# Patient Record
Sex: Male | Born: 1945
Health system: Southern US, Community
[De-identification: ages and names within clinical notes are randomized; demographics above are authoritative.]

## PROBLEM LIST (undated history)

## (undated) DIAGNOSIS — R011 Cardiac murmur, unspecified: Secondary | ICD-10-CM

## (undated) DIAGNOSIS — I4891 Unspecified atrial fibrillation: Secondary | ICD-10-CM

## (undated) DIAGNOSIS — H269 Unspecified cataract: Secondary | ICD-10-CM

## (undated) DIAGNOSIS — I10 Essential (primary) hypertension: Secondary | ICD-10-CM

## (undated) DIAGNOSIS — C61 Malignant neoplasm of prostate: Secondary | ICD-10-CM

## (undated) DIAGNOSIS — D573 Sickle-cell trait: Secondary | ICD-10-CM

## (undated) DIAGNOSIS — M889 Osteitis deformans of unspecified bone: Secondary | ICD-10-CM

## (undated) DIAGNOSIS — I071 Rheumatic tricuspid insufficiency: Secondary | ICD-10-CM

## (undated) DIAGNOSIS — N189 Chronic kidney disease, unspecified: Secondary | ICD-10-CM

## (undated) HISTORY — DX: Rheumatic tricuspid insufficiency: I07.1

## (undated) HISTORY — DX: Chronic kidney disease, unspecified: N18.9

## (undated) HISTORY — DX: Unspecified cataract: H26.9

## (undated) HISTORY — DX: Malignant neoplasm of prostate: C61

## (undated) HISTORY — PX: BLADDER SURGERY: SHX569

## (undated) HISTORY — PX: PENILE PROSTHESIS IMPLANT: SHX240

## (undated) HISTORY — DX: Sickle-cell trait: D57.3

## (undated) HISTORY — DX: Essential (primary) hypertension: I10

## (undated) HISTORY — DX: Unspecified atrial fibrillation: I48.91

## (undated) HISTORY — DX: Cardiac murmur, unspecified: R01.1

## (undated) HISTORY — PX: COLONOSCOPY: SHX174

## (undated) HISTORY — PX: CHOLECYSTECTOMY: SHX55

---

## 2000-05-17 ENCOUNTER — Encounter: Admission: RE | Admit: 2000-05-17 | Discharge: 2000-06-14 | Payer: Self-pay | Admitting: Orthopedic Surgery

## 2002-04-21 ENCOUNTER — Emergency Department (HOSPITAL_COMMUNITY): Admission: EM | Admit: 2002-04-21 | Discharge: 2002-04-21 | Payer: Self-pay | Admitting: Emergency Medicine

## 2005-03-23 ENCOUNTER — Encounter: Admission: RE | Admit: 2005-03-23 | Discharge: 2005-03-23 | Payer: Self-pay | Admitting: Cardiology

## 2005-03-25 ENCOUNTER — Emergency Department (HOSPITAL_COMMUNITY): Admission: EM | Admit: 2005-03-25 | Discharge: 2005-03-25 | Payer: Self-pay | Admitting: Emergency Medicine

## 2005-04-07 ENCOUNTER — Ambulatory Visit (HOSPITAL_BASED_OUTPATIENT_CLINIC_OR_DEPARTMENT_OTHER): Admission: RE | Admit: 2005-04-07 | Discharge: 2005-04-07 | Payer: Self-pay | Admitting: *Deleted

## 2005-04-11 ENCOUNTER — Ambulatory Visit: Payer: Self-pay | Admitting: Internal Medicine

## 2005-07-07 ENCOUNTER — Ambulatory Visit (HOSPITAL_COMMUNITY): Admission: RE | Admit: 2005-07-07 | Discharge: 2005-07-07 | Payer: Self-pay | Admitting: *Deleted

## 2008-03-23 ENCOUNTER — Ambulatory Visit (HOSPITAL_COMMUNITY): Admission: RE | Admit: 2008-03-23 | Discharge: 2008-03-23 | Payer: Self-pay | Admitting: Family Medicine

## 2009-11-29 HISTORY — PX: FINGER GANGLION CYST EXCISION: SHX1636

## 2010-02-13 ENCOUNTER — Ambulatory Visit (HOSPITAL_BASED_OUTPATIENT_CLINIC_OR_DEPARTMENT_OTHER): Admission: RE | Admit: 2010-02-13 | Discharge: 2010-02-13 | Payer: Self-pay | Admitting: Urology

## 2010-02-27 ENCOUNTER — Ambulatory Visit (HOSPITAL_COMMUNITY): Admission: RE | Admit: 2010-02-27 | Discharge: 2010-02-27 | Payer: Self-pay | Admitting: Internal Medicine

## 2010-11-29 HISTORY — PX: HYDROCELE EXCISION: SHX482

## 2011-02-22 LAB — POCT I-STAT 4, (NA,K, GLUC, HGB,HCT)
HCT: 43 % (ref 39.0–52.0)
Sodium: 141 mEq/L (ref 135–145)

## 2011-04-16 NOTE — Op Note (Signed)
Jeffrey Perry, Jeffrey Perry                  ACCOUNT NO.:  [id number]   MEDICAL RECORD NO.:  [id number]          PATIENT TYPE:  AMB   LOCATION:  ENDO                         FACILITY:  [facility]   PHYSICIAN:  Georgiana Spinner, M.D.    DATE OF BIRTH:  [birth year]   DATE OF PROCEDURE:  07/07/2005  DATE OF DISCHARGE:                                 OPERATIVE REPORT   PROCEDURE:  Upper endoscopy.   INDICATIONS:  Gastroesophageal reflux disease.   ANESTHESIA:  Demerol 60, Versed 7 mg.   DESCRIPTION OF PROCEDURE:  With the patient mildly sedated in the left  lateral decubitus position, the Olympus videoscopic endoscope was inserted  in the mouth, passed under direct vision through the esophagus which  appeared normal. There was no evidence of Barrett's. We entered into the  stomach, fundus, body, antrum, duodenal bulb, and second portion of duodenum  appeared normal. From this point, the endoscope was slowly withdrawn taking  circumferential views of the duodenal mucosa until the endoscope had been  pulled back into the stomach, placed in retroflexion to view the stomach  from below. The endoscope was straightened and withdrawn taking  circumferential views of the remaining gastric and esophageal mucosa. The  patient's vital signs and pulse oximeter remained stable. The patient  tolerated the procedure well without apparent complications.   FINDINGS:  Loose wrap of the GE junction around the endoscope, otherwise an  unremarkable examination.   PLAN:  Proceed to colonoscopy.       GMO/MEDQ  D:  07/07/2005  T:  07/07/2005  Job:  [id number]   cc:   Jonita Albee, M.D.  [facility]  [address]  [place]  [place] [address]  Fax: [phone]

## 2011-04-16 NOTE — Op Note (Signed)
NAMELATAVIOUS, BITTER                  ACCOUNT NO.:  [id number]   MEDICAL RECORD NO.:  [id number]          PATIENT TYPE:  AMB   LOCATION:  ENDO                         FACILITY:  [facility]   PHYSICIAN:  Georgiana Spinner, M.D.    DATE OF BIRTH:  Jul 25, 1946   DATE OF PROCEDURE:  07/07/2005  DATE OF DISCHARGE:                                 OPERATIVE REPORT   PROCEDURE:  Colonoscopy.   ANESTHESIA:  Demerol 10 mg, Versed 1 mg.   INDICATIONS:  Colon cancer screening. History of Hemoccult positivity.   DESCRIPTION OF PROCEDURE:  With the patient mildly sedated in the left  lateral decubitus position, a rectal examination was performed which was  unremarkable. Subsequently the Olympus videoscopic colonoscope was inserted  into the rectum and passed under direct vision to the cecum identified by  the ileocecal valve and appendiceal orifice both of which were photographed.  Of note, the prep was slightly suboptimal and there was areas of tenacious  yellow thick fecal material that was difficult to wash and suction because  of its tenacity but we did as best we could withdrawing the scope taking  circumferential views of colonic mucosa stopping only then in the rectum  which appeared normal on direct and retroflexed view and showed hemorrhoids.  The endoscope was straightened and withdrawn. The patient's vital signs and  pulse oximeter remained stable. The patient tolerated the procedure well  without apparent complication.   FINDINGS:  Internal hemorrhoids otherwise an unremarkable colonoscopic  examination to the cecum limited somewhat by prep.   PLAN:  Consider repeat examination in a few years.       GMO/MEDQ  D:  07/07/2005  T:  07/07/2005  Job:  [id number]   cc:   Jonita Albee, M.D.  [facility]  [address]  [place]  [place] [address]  Fax: [phone]

## 2011-04-16 NOTE — Procedures (Signed)
NAMEKONGMENG, SANTORO                  ACCOUNT NO.:  [id number]   MEDICAL RECORD NO.:  [id number]          PATIENT TYPE:  OUT   LOCATION:  SLEEP CENTER                 FACILITY:  [facility]   PHYSICIAN:  Clinton D. Maple Hudson, M.D. DATE OF BIRTH:  18-May-1946   DATE OF STUDY:  04/07/2005                              NOCTURNAL POLYSOMNOGRAM   REFERRING PHYSICIAN:  Dr. Kevin Fenton Spruill   DATE OF STUDY:  Apr 07, 2005   INDICATION FOR STUDY:  Insomnia with sleep apnea.  Epworth Sleepiness Score  7/24, BMI 28, weight 216 pounds.   SLEEP ARCHITECTURE:  Total sleep time 326 minutes with sleep efficiency 83%.  Stage I was 1%, stage II 77%, stages III and IV were 8% and REM was 15% of  total sleep time.  Sleep latency 9 minutes, REM latency 101 minutes, awake  after sleep onset 16 minutes, arousal index 9.   RESPIRATORY DATA:  Respiratory disturbance index (RDI, AHI) 6.1 obstructive  events per hour indicating very mild obstructive sleep apnea/hypopnea  syndrome.  There were 1 central apnea, 14 obstructive apneas and 18  hypopneas.  Events were not positional.  REM RDI 11.4.  He did not meet  frequent C/time criteria for split-study protocol on this study night.   OXYGEN DATA:  Moderate snoring with oxygen desaturation to 79%.  Mean oxygen  saturation through the study was 95% on room air.   CARDIAC DATA:  Normal sinus rhythm.  Occasional PVCs.   MOVEMENT/PARASOMNIA:  Occasional leg jerks with insignificant arousal.   IMPRESSION/RECOMMENDATION:  1.  Very mild obstructive sleep apnea/hypopnea syndrome, respiratory      disturbance index 6.1 per hour (the upper limit of normal for adults is      considered to be 5 episodes per hour).  There was moderate snoring with      oxygen desaturation to 79%.  Continuous positive airway pressure was not      ordinarily used to treat apnea scores in this range unless individual      circumstances indicate.  2.  Patient's complaint of waking during the night is  reflected in an      episode of spontaneous awakening for approximately 20 minutes shortly      before 1 a.m. and sustained waking after 3:41 a.m.  These episodes are      nonspecific with no physiologic event clearly triggering awakening, and      may most appropriately be treated as insomnia.      CDY/MEDQ  D:  04/11/2005 13:47:11  T:  04/11/2005 19:08:51  Job:  [id number]

## 2011-05-25 ENCOUNTER — Ambulatory Visit
Admission: RE | Admit: 2011-05-25 | Discharge: 2011-05-25 | Disposition: A | Discharge status: Home / Self Care | Payer: BC Managed Care – PPO | Source: Ambulatory Visit | Attending: Radiation Oncology | Admitting: Radiation Oncology

## 2011-05-25 DIAGNOSIS — I10 Essential (primary) hypertension: Secondary | ICD-10-CM | POA: Insufficient documentation

## 2011-05-25 DIAGNOSIS — C61 Malignant neoplasm of prostate: Secondary | ICD-10-CM | POA: Insufficient documentation

## 2011-05-25 DIAGNOSIS — N529 Male erectile dysfunction, unspecified: Secondary | ICD-10-CM | POA: Insufficient documentation

## 2011-05-25 DIAGNOSIS — N401 Enlarged prostate with lower urinary tract symptoms: Secondary | ICD-10-CM | POA: Insufficient documentation

## 2011-05-25 DIAGNOSIS — N138 Other obstructive and reflux uropathy: Secondary | ICD-10-CM | POA: Insufficient documentation

## 2011-05-25 DIAGNOSIS — Z79899 Other long term (current) drug therapy: Secondary | ICD-10-CM | POA: Insufficient documentation

## 2011-09-06 ENCOUNTER — Ambulatory Visit
Admission: RE | Admit: 2011-09-06 | Discharge: 2011-09-06 | Disposition: A | Discharge status: Home / Self Care | Payer: BC Managed Care – PPO | Source: Ambulatory Visit | Attending: Radiation Oncology | Admitting: Radiation Oncology

## 2011-09-06 DIAGNOSIS — Z510 Encounter for antineoplastic radiation therapy: Secondary | ICD-10-CM | POA: Insufficient documentation

## 2011-09-06 DIAGNOSIS — C61 Malignant neoplasm of prostate: Secondary | ICD-10-CM | POA: Insufficient documentation

## 2011-09-07 ENCOUNTER — Encounter: Payer: Self-pay | Admitting: *Deleted

## 2011-09-21 ENCOUNTER — Encounter: Payer: Self-pay | Admitting: *Deleted

## 2011-10-04 ENCOUNTER — Ambulatory Visit
Admission: RE | Admit: 2011-10-04 | Discharge: 2011-10-04 | Disposition: A | Discharge status: Home / Self Care | Payer: BC Managed Care – PPO | Source: Ambulatory Visit | Attending: Radiation Oncology | Admitting: Radiation Oncology

## 2011-10-04 DIAGNOSIS — Z8546 Personal history of malignant neoplasm of prostate: Secondary | ICD-10-CM

## 2011-10-04 DIAGNOSIS — C61 Malignant neoplasm of prostate: Secondary | ICD-10-CM

## 2011-10-04 NOTE — Progress Notes (Signed)
[facility] Radiation Oncology Weekly Treatment Note    Name: Jeffrey Perry Date: 10/04/2011 MRN: [id number] DOB: 1946-04-22  Status:outpatient    Current dose: 2660cGY  Current fraction:14  Planned dose:7600cGy  Planned fraction:40   MEDICATIONS:@MEDADMINPROSE @   ALLERGIES: Lisinopril and Sulfonamide derivatives   LABORATORY DATA:     NARRATIVE: Jeffrey Perry was seen today for weekly treatment management. The chart was checked and CBCT images were reviewed.He does some urinary frequency with noc. X 4. No dysuria. No GI problems, but does have L groin discomfort when raising L leg.  PHYSICAL EXAMINATION: vitals were not taken for this visit. Wt 231.6 lbs. Abd. Soft. No inguinal hernia noted.         ASSESSMENT: Patient tolerating treatments well.    PLAN: Continue treatment as planned.  To see Dr. Brunilda Payor if L groin pain continues.

## 2011-10-05 ENCOUNTER — Ambulatory Visit
Admission: RE | Admit: 2011-10-05 | Payer: BC Managed Care – PPO | Source: Ambulatory Visit | Attending: Radiation Oncology | Admitting: Radiation Oncology

## 2011-10-05 ENCOUNTER — Ambulatory Visit
Admission: RE | Admit: 2011-10-05 | Discharge: 2011-10-05 | Disposition: A | Discharge status: Home / Self Care | Payer: BC Managed Care – PPO | Source: Ambulatory Visit | Attending: Radiation Oncology | Admitting: Radiation Oncology

## 2011-10-06 ENCOUNTER — Ambulatory Visit
Admission: RE | Admit: 2011-10-06 | Discharge: 2011-10-06 | Disposition: A | Discharge status: Home / Self Care | Payer: BC Managed Care – PPO | Source: Ambulatory Visit | Attending: Radiation Oncology | Admitting: Radiation Oncology

## 2011-10-07 ENCOUNTER — Ambulatory Visit
Admission: RE | Admit: 2011-10-07 | Discharge: 2011-10-07 | Disposition: A | Discharge status: Home / Self Care | Payer: BC Managed Care – PPO | Source: Ambulatory Visit | Attending: Radiation Oncology | Admitting: Radiation Oncology

## 2011-10-08 ENCOUNTER — Ambulatory Visit
Admission: RE | Admit: 2011-10-08 | Discharge: 2011-10-08 | Disposition: A | Discharge status: Home / Self Care | Payer: BC Managed Care – PPO | Source: Ambulatory Visit | Attending: Radiation Oncology | Admitting: Radiation Oncology

## 2011-10-11 ENCOUNTER — Ambulatory Visit
Admission: RE | Admit: 2011-10-11 | Discharge: 2011-10-11 | Disposition: A | Discharge status: Home / Self Care | Payer: BC Managed Care – PPO | Source: Ambulatory Visit | Attending: Radiation Oncology | Admitting: Radiation Oncology

## 2011-10-11 DIAGNOSIS — C61 Malignant neoplasm of prostate: Secondary | ICD-10-CM

## 2011-10-11 NOTE — Progress Notes (Signed)
[facility] Radiation Oncology Weekly Treatment Note    Name: Jeffrey Perry Date: 10/11/2011 MRN: [id number] DOB: 16-May-1946  Status:outpatient    Current dose: 3610cGy  Current fraction:19  Planned dose:7600  Planned fraction:40   ALLERGIES: Lisinopril and Sulfonamide derivatives    NARRATIVE: Jeffrey Perry was seen today for weekly treatment management. The chart was checked and CBCT images were reviewed. He has excellent bladder filling. He does report an increase in urinary frequency with nocturia x2. No GI difficulties. He is on Uroxatral.  PHYSICAL EXAMINATION: weight is 234 lb 9.6 oz (106.414 kg).         No change.    ASSESSMENT: Patient tolerating treatments well.    PLAN: Continue treatment as planned.

## 2011-10-11 NOTE — Progress Notes (Signed)
C/o burning in rectal area and also still having pain in left groin area when walking.

## 2011-10-12 ENCOUNTER — Ambulatory Visit
Admission: RE | Admit: 2011-10-12 | Discharge: 2011-10-12 | Disposition: A | Discharge status: Home / Self Care | Payer: BC Managed Care – PPO | Source: Ambulatory Visit | Attending: Radiation Oncology | Admitting: Radiation Oncology

## 2011-10-13 ENCOUNTER — Ambulatory Visit
Admission: RE | Admit: 2011-10-13 | Discharge: 2011-10-13 | Disposition: A | Discharge status: Home / Self Care | Payer: BC Managed Care – PPO | Source: Ambulatory Visit | Attending: Radiation Oncology | Admitting: Radiation Oncology

## 2011-10-14 ENCOUNTER — Ambulatory Visit
Admission: RE | Admit: 2011-10-14 | Discharge: 2011-10-14 | Disposition: A | Discharge status: Home / Self Care | Payer: BC Managed Care – PPO | Source: Ambulatory Visit | Attending: Radiation Oncology | Admitting: Radiation Oncology

## 2011-10-15 ENCOUNTER — Ambulatory Visit
Admission: RE | Admit: 2011-10-15 | Discharge: 2011-10-15 | Disposition: A | Discharge status: Home / Self Care | Payer: BC Managed Care – PPO | Source: Ambulatory Visit | Attending: Radiation Oncology | Admitting: Radiation Oncology

## 2011-10-16 ENCOUNTER — Ambulatory Visit
Admission: RE | Admit: 2011-10-16 | Discharge: 2011-10-16 | Disposition: A | Discharge status: Home / Self Care | Payer: BC Managed Care – PPO | Source: Ambulatory Visit | Attending: Radiation Oncology | Admitting: Radiation Oncology

## 2011-10-18 ENCOUNTER — Ambulatory Visit
Admission: RE | Admit: 2011-10-18 | Discharge: 2011-10-18 | Disposition: A | Discharge status: Home / Self Care | Payer: BC Managed Care – PPO | Source: Ambulatory Visit | Attending: Radiation Oncology | Admitting: Radiation Oncology

## 2011-10-18 VITALS — Wt 234.8 lb

## 2011-10-18 DIAGNOSIS — C61 Malignant neoplasm of prostate: Secondary | ICD-10-CM

## 2011-10-18 NOTE — Progress Notes (Signed)
[facility] Radiation Oncology Weekly Treatment Note    Name: Jeffrey Perry Date: 10/18/2011 MRN: [id number] DOB: [birth year]  Status:outpatient    Current dose: 4750cGy  Current fraction:25  Planned dose:7600cGy  Planned fraction:40   ALLERGIES: Lisinopril and Sulfonamide derivatives    NARRATIVE: Jeffrey Perry was seen today for weekly treatment management. The chart was checked and CBCT images were reviewed. Bladder filling is excellent. No new GU or GI difficulties. He remains on Uroxatral.  PHYSICAL EXAMINATION: weight is 234 lb 12.8 oz (106.505 kg).         No change.    ASSESSMENT: Patient tolerating treatments well.    PLAN: Continue treatment as planned.

## 2011-10-18 NOTE — Progress Notes (Signed)
C/O BURNING AND ITCHING IN RECTAL AREA

## 2011-10-19 ENCOUNTER — Ambulatory Visit
Admission: RE | Admit: 2011-10-19 | Discharge: 2011-10-19 | Disposition: A | Discharge status: Home / Self Care | Payer: BC Managed Care – PPO | Source: Ambulatory Visit | Attending: Radiation Oncology | Admitting: Radiation Oncology

## 2011-10-20 ENCOUNTER — Ambulatory Visit
Admission: RE | Admit: 2011-10-20 | Discharge: 2011-10-20 | Disposition: A | Discharge status: Home / Self Care | Payer: BC Managed Care – PPO | Source: Ambulatory Visit | Attending: Radiation Oncology | Admitting: Radiation Oncology

## 2011-10-22 ENCOUNTER — Ambulatory Visit: Payer: BC Managed Care – PPO

## 2011-10-25 ENCOUNTER — Ambulatory Visit
Admission: RE | Admit: 2011-10-25 | Discharge: 2011-10-25 | Disposition: A | Discharge status: Home / Self Care | Payer: BC Managed Care – PPO | Source: Ambulatory Visit | Attending: Radiation Oncology | Admitting: Radiation Oncology

## 2011-10-25 VITALS — Wt 232.7 lb

## 2011-10-25 DIAGNOSIS — C61 Malignant neoplasm of prostate: Secondary | ICD-10-CM

## 2011-10-25 NOTE — Progress Notes (Signed)
Weekly Management Note:  Site: Prostate Current Dose:  5320  cGy Projected Dose: 7600  cGy  Narrative: The patient is seen today for routine under treatment assessment. CBCT/MVCT images/port films were reviewed. The chart was reviewed.   Bladder filling is satisfactory.  Physical Examination: There were no vitals filed for this visit..  Weight: 232 lb 11.2 oz (105.552 kg). No change.  Impression: Tolerating radiation therapy well.  Plan: Continue radiation therapy as planned.

## 2011-10-25 NOTE — Progress Notes (Signed)
C/O ITCHING AT "TAIL BONE" AREA

## 2011-10-26 ENCOUNTER — Ambulatory Visit
Admission: RE | Admit: 2011-10-26 | Discharge: 2011-10-26 | Disposition: A | Discharge status: Home / Self Care | Payer: BC Managed Care – PPO | Source: Ambulatory Visit | Attending: Radiation Oncology | Admitting: Radiation Oncology

## 2011-10-27 ENCOUNTER — Ambulatory Visit
Admission: RE | Admit: 2011-10-27 | Discharge: 2011-10-27 | Disposition: A | Discharge status: Home / Self Care | Payer: BC Managed Care – PPO | Source: Ambulatory Visit | Attending: Radiation Oncology | Admitting: Radiation Oncology

## 2011-10-28 ENCOUNTER — Ambulatory Visit
Admission: RE | Admit: 2011-10-28 | Discharge: 2011-10-28 | Disposition: A | Discharge status: Home / Self Care | Payer: BC Managed Care – PPO | Source: Ambulatory Visit | Attending: Radiation Oncology | Admitting: Radiation Oncology

## 2011-10-29 ENCOUNTER — Ambulatory Visit
Admission: RE | Admit: 2011-10-29 | Discharge: 2011-10-29 | Disposition: A | Discharge status: Home / Self Care | Payer: BC Managed Care – PPO | Source: Ambulatory Visit | Attending: Radiation Oncology | Admitting: Radiation Oncology

## 2011-11-01 ENCOUNTER — Ambulatory Visit
Admission: RE | Admit: 2011-11-01 | Discharge: 2011-11-01 | Disposition: A | Discharge status: Home / Self Care | Payer: BC Managed Care – PPO | Source: Ambulatory Visit | Attending: Radiation Oncology | Admitting: Radiation Oncology

## 2011-11-01 DIAGNOSIS — C61 Malignant neoplasm of prostate: Secondary | ICD-10-CM

## 2011-11-01 NOTE — Progress Notes (Addendum)
Weekly Management Note:  Site:Prostate Current Dose:  6270  cGy Projected Dose: 7600  cGy  Narrative: The patient is seen today for routine under treatment assessment. CBCT/MVCT images/port films were reviewed. The chart was reviewed.   Bladder filling is excellent. He is without GU or GI difficulties.He does report occasional L groin pain present since his biopsy. He will see Dr. Brunilda Payor again on Dec. 27.  Physical Examination: There were no vitals filed for this visit..  Weight:  . No change. No hernia noted previously.  Impression: Tolerating radiation therapy well.  Plan: Continue radiation therapy as planned.To see Dr. Brunilda Payor on Dec. 27.

## 2011-11-01 NOTE — Progress Notes (Signed)
Encounter addended by: Maryln Gottron, MD on: 11/01/2011  4:38 PM<BR>     Documentation filed: Notes Section

## 2011-11-01 NOTE — Progress Notes (Signed)
SKIN FEELS A LITTLE DRY AND ITCHY.  ALSO STILL HAVING PAIN IN LEFT GROIN AREA , RATES 1/10

## 2011-11-02 ENCOUNTER — Ambulatory Visit
Admission: RE | Admit: 2011-11-02 | Discharge: 2011-11-02 | Disposition: A | Discharge status: Home / Self Care | Payer: BC Managed Care – PPO | Source: Ambulatory Visit | Attending: Radiation Oncology | Admitting: Radiation Oncology

## 2011-11-03 ENCOUNTER — Ambulatory Visit
Admission: RE | Admit: 2011-11-03 | Discharge: 2011-11-03 | Disposition: A | Discharge status: Home / Self Care | Payer: BC Managed Care – PPO | Source: Ambulatory Visit | Attending: Radiation Oncology | Admitting: Radiation Oncology

## 2011-11-04 ENCOUNTER — Ambulatory Visit
Admission: RE | Admit: 2011-11-04 | Discharge: 2011-11-04 | Disposition: A | Discharge status: Home / Self Care | Payer: BC Managed Care – PPO | Source: Ambulatory Visit | Attending: Radiation Oncology | Admitting: Radiation Oncology

## 2011-11-05 ENCOUNTER — Ambulatory Visit
Admission: RE | Admit: 2011-11-05 | Discharge: 2011-11-05 | Disposition: A | Discharge status: Home / Self Care | Payer: BC Managed Care – PPO | Source: Ambulatory Visit | Attending: Radiation Oncology | Admitting: Radiation Oncology

## 2011-11-08 ENCOUNTER — Ambulatory Visit
Admission: RE | Admit: 2011-11-08 | Discharge: 2011-11-08 | Disposition: A | Discharge status: Home / Self Care | Payer: BC Managed Care – PPO | Source: Ambulatory Visit | Attending: Radiation Oncology | Admitting: Radiation Oncology

## 2011-11-08 VITALS — Wt 235.9 lb

## 2011-11-08 DIAGNOSIS — C61 Malignant neoplasm of prostate: Secondary | ICD-10-CM

## 2011-11-08 NOTE — Progress Notes (Signed)
Weekly Management Note:  Site:Prostate Current Dose:  7220  cGy Projected Dose: 7600  cGy  Narrative: The patient is seen today for routine under treatment assessment. CBCT/MVCT images/port films were reviewed. The chart was reviewed.   Good bladder filling today. He does have a fair amount of urinary frequency with nocturia x2. No GI difficulties. He has intermittent left groin pain and he wonders if he has a fungal infection along his groins.  Physical Examination: There were no vitals filed for this visit..  Weight: 235 lb 14.4 oz (107.004 kg). There is no evidence for an inguinal hernia or fungal infection along the groin.  Impression: Tolerating radiation therapy well.  Plan: Continue radiation therapy as planned. He will finish his radiation therapy this Wednesday. Follow visit in one month.

## 2011-11-08 NOTE — Progress Notes (Signed)
C/O ITCHING IN RECTAL AREA AND GROIN AREA.  STILL HAS THE INTERMITTENT PAIN IN LEFT GROIN AREA, RATES AS 2/10

## 2011-11-09 ENCOUNTER — Ambulatory Visit
Admission: RE | Admit: 2011-11-09 | Discharge: 2011-11-09 | Disposition: A | Discharge status: Home / Self Care | Payer: BC Managed Care – PPO | Source: Ambulatory Visit | Attending: Radiation Oncology | Admitting: Radiation Oncology

## 2011-11-10 ENCOUNTER — Ambulatory Visit
Admission: RE | Admit: 2011-11-10 | Discharge: 2011-11-10 | Disposition: A | Discharge status: Home / Self Care | Payer: BC Managed Care – PPO | Source: Ambulatory Visit | Attending: Radiation Oncology | Admitting: Radiation Oncology

## 2011-11-10 ENCOUNTER — Ambulatory Visit: Payer: BC Managed Care – PPO

## 2011-11-12 ENCOUNTER — Encounter: Payer: Self-pay | Admitting: Radiation Oncology

## 2011-11-12 NOTE — Progress Notes (Signed)
The patient began his IMRT radiation therapy in the management of his carcinoma of the prostate.  He was treated with 2 modulated arcs, requiring dynamic multileaf collimation corresponding to 1 set of IMRT treatment devices ([id number]).    ______________________________ Maryln Gottron, M.D. RJM/MEDQ  D:  11/12/2011  T:  11/12/2011  Job:  [id number]

## 2011-11-12 NOTE — Progress Notes (Signed)
[facility] Radiation Oncology  Name: Jeffrey Perry MRN: [id number]  Date: 11/12/2011  DOB: [birth year]  Status:outpatient    CC: Su Grand, MD, Ahna Konkle Bellow, MD  REFERRING PHYSICIAN:  Su Grand, MD     DIAGNOSIS: Stage TI C. favorable risk adenocarcinoma of the prostate   INDICATION FOR TREATMENT: Curative   TREATMENT DATES: 09/15/2011 through 11/10/2011   SITE/DOSE: Prostate 7600 cGy a 40 sessions   BEAMS/ENERGY: 6 MV photons VMAT/IMRT with 2 arcs   NARRATIVE: The patient tolerated his treatment beautifully with no significant GU or GI toxicity by completion of therapy. We performed daily cone beam CT showing he had excellent bladder filling during each treatment.   PLAN: Routine followup in one month. Patient instructed to call if questions or worsening complaints in interim.

## 2011-11-27 ENCOUNTER — Ambulatory Visit (INDEPENDENT_AMBULATORY_CARE_PROVIDER_SITE_OTHER): Payer: BC Managed Care – PPO

## 2011-11-27 DIAGNOSIS — C61 Malignant neoplasm of prostate: Secondary | ICD-10-CM

## 2011-11-27 DIAGNOSIS — M25559 Pain in unspecified hip: Secondary | ICD-10-CM

## 2011-12-14 ENCOUNTER — Ambulatory Visit
Admission: RE | Admit: 2011-12-14 | Discharge: 2011-12-14 | Disposition: A | Discharge status: Home / Self Care | Payer: BC Managed Care – PPO | Source: Ambulatory Visit | Attending: Radiation Oncology | Admitting: Radiation Oncology

## 2011-12-14 ENCOUNTER — Encounter: Payer: Self-pay | Admitting: Radiation Oncology

## 2011-12-14 VITALS — BP 148/89 | HR 69 | Resp 18 | Wt 233.1 lb

## 2011-12-14 DIAGNOSIS — C61 Malignant neoplasm of prostate: Secondary | ICD-10-CM

## 2011-12-14 NOTE — Progress Notes (Signed)
Followup note:  The patient returns today approximately 1 month following completion of IMRT in the management of his stage TI C. favorable risk adenocarcinoma prostate. He is without GU or GI difficulties. However, he developed right lower back discomfort this past Saturday. His pain is worse with movement. He does not recall picking up any heavy objects.  Physical examination: He describes pain along the right parasternal/medial right iliac wing. There is no palpable spinal discomfort. Neurologic examination: He has generalized hyporeflexia. Lower extremity strength is excellent and symmetrical.  Impression: He is doing well from her radiation therapy standpoint. He'll see Dr. Brunilda Payor for a followup visit in March. He may have a lumbar disc or a muscle pull for which I recommend rest and NSAIDs.  Plan: As above. I've not scheduled the patient for a formal followup visit and I asked that Dr. Brunilda Payor keep me posted on his progress.

## 2011-12-14 NOTE — Progress Notes (Signed)
Patient presents to the clinic today unaccompanied for a follow up appointment with Dr. Dayton Scrape. Patient is alert and oriented to person, place, and time. No distress noted. Steady gait noted. Pleasant affect noted. Patient reports right side low back pain 7 on a scale of 0-10 with movement since Saturday night. Patient reports taking OTC Tylenol for this pain but that it only take the edge off for an hour or two. Patient reports that on average he gets up 1-2 times per night to void. Patient denies seeing any blood in his urine. Patient reports that for the last few day he has felt burning upon urination. Patient reports he is scheduled to follow up with [facility] on March 14 for blood work then March 21 for those results. Reported all findings to Dr. Dayton Scrape

## 2012-02-01 ENCOUNTER — Other Ambulatory Visit: Payer: Self-pay | Admitting: Internal Medicine

## 2012-02-23 ENCOUNTER — Ambulatory Visit (INDEPENDENT_AMBULATORY_CARE_PROVIDER_SITE_OTHER): Payer: BC Managed Care – PPO | Admitting: Emergency Medicine

## 2012-02-23 VITALS — BP 136/84 | HR 64 | Temp 98.8°F | Resp 18 | Ht 71.5 in | Wt 222.0 lb

## 2012-02-23 DIAGNOSIS — G5712 Meralgia paresthetica, left lower limb: Secondary | ICD-10-CM

## 2012-02-23 DIAGNOSIS — C61 Malignant neoplasm of prostate: Secondary | ICD-10-CM

## 2012-02-23 DIAGNOSIS — G5710 Meralgia paresthetica, unspecified lower limb: Secondary | ICD-10-CM

## 2012-02-23 MED ORDER — GABAPENTIN 100 MG PO CAPS: ORAL_CAPSULE | ORAL | Status: DC

## 2012-02-23 NOTE — Patient Instructions (Signed)
Meralgia Paresthetica  Meralgia paresthetica (MP) is a disorder characterized by tingling, numbness, and burning pain in the outer side of the thigh. It occurs in men more than women. MP is generally found in middle-aged or overweight people. Sometimes, the disorder may disappear. CAUSES The disorder is caused by a nerve in the thigh being squeezed (compressed). MP may be associated with tight clothing, pregnancy, diabetes, and being overweight (obese). SYMPTOMS  Tingling, numbness, and burning in the outer thigh.   An area of the skin may be painful and sensitive to the touch.  The symptoms often worsen after walking or standing. TREATMENT  Treatment is based on your symptoms and is mainly supportive. Treatment may include:  Wearing looser clothing.   Losing weight.   Avoiding prolonged standing or walking.   Taking medication.   Surgery if the pain is peristent or severe.  MP usually eases or disappears after treatment. Surgery is not always fully successful. Document Released: 11/05/2002 Document Revised: 11/04/2011 Document Reviewed: 11/15/2005 [facility] Patient Information 2012 [place], [place].

## 2012-02-23 NOTE — Progress Notes (Signed)
  Subjective:    Patient ID: Shawn Carattini, male    DOB: [birth year], 66 y.o.   MRN: [id number]  HPI patient enters with a burning numbness sensation lateral left thigh appear had no injury to the area. History reveals that the patient has prostate cancer and just completed a course of radiation therapy at [place]. He denies any weakness in his legs. He denies any other neurological symptoms such as headache weakness dizziness double vision blurred vision.    Review of Systems positive for his recent diagnosis of prostate cancer which was treated with external beam radiation.     Objective:   Physical Exam  Constitutional: He is oriented to person, place, and time.  Neurological: He is alert and oriented to person, place, and time. He has normal reflexes. He displays normal reflexes. No cranial nerve deficit. He exhibits normal muscle tone. Coordination normal.       There is decreased sensation in the lateral portion of the left upper thigh. He dose of some diminished range of motion of the left hip however x-rays were done in December and these were normal.          Assessment & Plan:   Assessment is hypoesthesia of the lateral left thigh. I suspect this is meralgia paresthetica. I do not suspect a more serious etiology. He had a Gleason score 6 on his previous biopsies.

## 2012-02-29 ENCOUNTER — Other Ambulatory Visit: Payer: Self-pay | Admitting: Physician Assistant

## 2012-03-06 ENCOUNTER — Encounter: Payer: Self-pay | Admitting: Internal Medicine

## 2012-03-06 ENCOUNTER — Ambulatory Visit: Payer: BC Managed Care – PPO

## 2012-03-06 ENCOUNTER — Ambulatory Visit (INDEPENDENT_AMBULATORY_CARE_PROVIDER_SITE_OTHER): Payer: BC Managed Care – PPO | Admitting: Internal Medicine

## 2012-03-06 ENCOUNTER — Other Ambulatory Visit: Payer: Self-pay

## 2012-03-06 VITALS — BP 124/81 | HR 65 | Temp 97.1°F | Resp 16 | Ht 71.5 in | Wt 228.2 lb

## 2012-03-06 DIAGNOSIS — Z79899 Other long term (current) drug therapy: Secondary | ICD-10-CM

## 2012-03-06 DIAGNOSIS — I10 Essential (primary) hypertension: Secondary | ICD-10-CM

## 2012-03-06 DIAGNOSIS — Z0000 Encounter for general adult medical examination without abnormal findings: Secondary | ICD-10-CM

## 2012-03-06 DIAGNOSIS — R609 Edema, unspecified: Secondary | ICD-10-CM

## 2012-03-06 DIAGNOSIS — C61 Malignant neoplasm of prostate: Secondary | ICD-10-CM

## 2012-03-06 LAB — CBC WITH DIFFERENTIAL/PLATELET
Basophils Absolute: 0 10*3/uL (ref 0.0–0.1)
Eosinophils Absolute: 0.1 10*3/uL (ref 0.0–0.7)
HCT: 41.8 % (ref 39.0–52.0)
Hemoglobin: 13.3 g/dL (ref 13.0–17.0)
Lymphocytes Relative: 26 % (ref 12–46)
Lymphs Abs: 0.9 10*3/uL (ref 0.7–4.0)
WBC: 3.7 10*3/uL — ABNORMAL LOW (ref 4.0–10.5)

## 2012-03-06 LAB — POCT UA - MICROSCOPIC ONLY
Bacteria, U Microscopic: NEGATIVE
Casts, Ur, LPF, POC: NEGATIVE
Crystals, Ur, HPF, POC: NEGATIVE
RBC, urine, microscopic: NEGATIVE

## 2012-03-06 LAB — COMPREHENSIVE METABOLIC PANEL
AST: 24 U/L (ref 0–37)
Alkaline Phosphatase: 224 U/L — ABNORMAL HIGH (ref 39–117)
Calcium: 9.6 mg/dL (ref 8.4–10.5)
Creat: 1.7 mg/dL — ABNORMAL HIGH (ref 0.50–1.35)
Glucose, Bld: 94 mg/dL (ref 70–99)
Sodium: 139 mEq/L (ref 135–145)
Total Bilirubin: 0.7 mg/dL (ref 0.3–1.2)
Total Protein: 7.4 g/dL (ref 6.0–8.3)

## 2012-03-06 LAB — LIPID PANEL
Triglycerides: 87 mg/dL (ref <150)
VLDL: 17 mg/dL (ref 0–40)

## 2012-03-06 LAB — POCT URINALYSIS DIPSTICK
Glucose, UA: NEGATIVE
Ketones, UA: NEGATIVE

## 2012-03-06 LAB — POCT CBG (FASTING - GLUCOSE)-MANUAL ENTRY: Glucose Fasting, POC: 110 mg/dL — AB (ref 70–99)

## 2012-03-06 LAB — TSH: TSH: 1.674 u[IU]/mL (ref 0.350–4.500)

## 2012-03-06 NOTE — Progress Notes (Signed)
  Subjective:    Patient ID: Jeffrey Perry, male    DOB: 1945/12/16, 66 y.o.   MRN: [id number]  HPI Prostate cancer, HTN , medications all stable. See scanned hx. Has new left ankle swelling off and on, not severe and not present now. No anle pain, No hx of clots.   Review of Systems See scanned ROS    Objective:   Physical Exam  Constitutional: He is oriented to person, place, and time. He appears well-developed and well-nourished.  HENT:  Right Ear: External ear normal.  Left Ear: External ear normal.  Nose: Nose normal.  Eyes: EOM are normal. Pupils are equal, round, and reactive to light.  Neck: Normal range of motion. Neck supple. No thyromegaly present.  Cardiovascular: Normal rate and normal heart sounds.   Pulmonary/Chest: Effort normal and breath sounds normal. No respiratory distress.  Abdominal: Soft. Bowel sounds are normal.  Genitourinary: Rectum normal, prostate normal and penis normal.  Musculoskeletal: Normal range of motion.  Lymphadenopathy:    He has no cervical adenopathy.  Neurological: He is oriented to person, place, and time. He has normal reflexes. He displays normal reflexes. No cranial nerve deficit. He exhibits normal muscle tone. Coordination normal.  Skin: Skin is warm and dry.  Psychiatric: He has a normal mood and affect.   [facility] reading (PRIMARY) by  Dr.Klarissa Mcilvain NAD.         Assessment & Plan:  CPE done Refill meds 1 year

## 2012-04-02 ENCOUNTER — Other Ambulatory Visit: Payer: Self-pay | Admitting: Physician Assistant

## 2012-04-25 ENCOUNTER — Ambulatory Visit (INDEPENDENT_AMBULATORY_CARE_PROVIDER_SITE_OTHER): Payer: BC Managed Care – PPO | Admitting: Internal Medicine

## 2012-04-25 VITALS — BP 147/79 | HR 69 | Temp 98.7°F | Resp 18 | Ht 71.52 in | Wt 233.0 lb

## 2012-04-25 DIAGNOSIS — Z7189 Other specified counseling: Secondary | ICD-10-CM

## 2012-04-25 DIAGNOSIS — N289 Disorder of kidney and ureter, unspecified: Secondary | ICD-10-CM

## 2012-04-25 DIAGNOSIS — Z79899 Other long term (current) drug therapy: Secondary | ICD-10-CM

## 2012-04-25 DIAGNOSIS — Z7184 Encounter for health counseling related to travel: Secondary | ICD-10-CM

## 2012-04-25 DIAGNOSIS — R944 Abnormal results of kidney function studies: Secondary | ICD-10-CM

## 2012-04-25 DIAGNOSIS — I10 Essential (primary) hypertension: Secondary | ICD-10-CM

## 2012-04-25 LAB — BASIC METABOLIC PANEL: Glucose, Bld: 72 mg/dL (ref 70–99)

## 2012-04-25 MED ORDER — DOXYCYCLINE HYCLATE 100 MG PO TABS
100.0000 mg | ORAL_TABLET | Freq: Every day | ORAL | Status: AC
Start: 2012-04-25 — End: 2012-05-05

## 2012-04-25 NOTE — Patient Instructions (Signed)
Chronic Renal Insufficiency Chronic renal insufficiency (also called kidney failure) occurs when there is kidney damage done. The damage prevents the kidneys from working like they should.  The kidneys do many important things. They:  Filter waste out of the blood.   Regulate the amount of water and various salts in the blood stream.   Produce chemicals that:   Prompt the bone marrow to make red blood cells.   Regulate blood pressure.   Keep calcium in balance throughout the bones and the body.  When the kidneys are damaged, they can no longer filter waste products out of the blood. These substances build up in the blood, causing illness.  CAUSES   Diabetes.   High blood pressure.   Glomerular diseases: Conditions that damage the tiny blood vessels (glomeruli) within the kidneys, such as:   Membranous nephropathy.   IgA nephropathy.   Focal segmental glomerulosclerosis.   Poisons (such as overdoses or misuse of acetaminophen or NSAIDS, or exposure to other toxic substances).   Kidney injuries.   Kidney cancer or cancer that spreads to the kidney.   Medications such as NSAIDs. These problems are rare.   Kidney stones.   Alport disease.   Polycystic kidneys.  SYMPTOMS  Most people do not notice symptoms of kidney failure until their kidney function drops below about 30-40% of normal. Symptoms can include:  Weakness.   Tiredness.   Frequent urination.   Intense need to urinate.   Excess bruising.   Low urine production.   Blood in the urine.   Pain in the kidney area.   Feeling sick to your stomach (nausea).   Vomiting.   Unusual bleeding.   Numbness in hands and feet.   Swelling in legs, arms and face.   Confusion.  DIAGNOSIS  Your caregiver will look for signs of kidney failure. Tests to diagnose kidney failure may include:  Urine tests: May reveal the presence of blood, protein or sugar.   Blood tests: May show low red blood cell count  (anemia) or high levels of waste products (BUN and creatinine) that are normally filtered out of the bloodstream by the kidneys.   Imaging tests - These are tests that create pictures of the organs inside the abdomen, such as the kidneys. They may reveal masses growing in the kidneys or blockages to the flow of urine. Possible imaging tests may include:   Ultrasound.   CT scan.   MRI.   Intravenous pyelogram or IVP. This is a test that involves injecting dye into the bloodstream and then taking a series of x-rays of the kidneys. This allows the kidneys and other parts of the urinary system to be viewed more clearly.   Kidney biopsy - A small sample of kidney is removed using a special needle. The sample is examined for abnormalities under a microscope.  TREATMENT  Chronic kidney failure cannot usually be cured. The various symptoms are treated, and measures are taken to avoid further kidney damage. Treatment for mild to moderate kidney failure may include:  Medication for high blood pressure.   Good control of diabetes.   Medication and diet change to improve anemia.   A low-sodium, low-potassium, low-protein and/or low-cholesterol diet.   Limiting the quantity of liquids in the diet.  Treatment for more severe kidney failure may require:  Dialysis - Mechanical methods of filtering the blood.   Kidney transplant - An operation that removes the diseased kidney and replaces it with a donated kidney.  HOME   CARE INSTRUCTIONS   Take medication as told by your caregiver.   Quit smoking if you are a smoker. Talk to your caregiver about a smoking cessation program.   Follow your prescribed diet.   If you are prescribed vitamins, take them as told.  SEEK IMMEDIATE MEDICAL CARE IF:  You start to produce less urine.   You notice blood in your urine.   You have increased pain.   You have increased weakness, fatigue or confusion.   You notice new swelling.   You develop a  fever.   You feel that you are having side effects of medicines prescribed.  Document Released: 08/24/2008 Document Revised: 11/04/2011 Document Reviewed: 12/07/2010 [employer] Patient Information 2012 [employer]. 

## 2012-04-25 NOTE — Progress Notes (Signed)
  Subjective:    Patient ID: Jeffrey Perry, male    DOB: 1946/09/14, 66 y.o.   MRN: [id number]  HPI Htn to goal Renal insuffiency will reck bmet Prostate cancer fatigue from tx persists Reviewed immunizations for [place]   Review of Systems     Objective:   Physical Exam 120/72 Lungs clear Heart nl       Assessment & Plan:  Counsel on travel UTD on all immunizations Doxy 100mg  to prevent malaria  HTN to goal Prostate cancer finished radiation Bmet to ck creatinine

## 2012-05-01 ENCOUNTER — Other Ambulatory Visit: Payer: Self-pay | Admitting: Internal Medicine

## 2012-05-01 ENCOUNTER — Other Ambulatory Visit: Payer: Self-pay | Admitting: Physician Assistant

## 2012-05-02 ENCOUNTER — Other Ambulatory Visit: Payer: Self-pay | Admitting: Physician Assistant

## 2012-05-02 ENCOUNTER — Other Ambulatory Visit: Payer: Self-pay

## 2012-05-02 ENCOUNTER — Other Ambulatory Visit: Payer: Self-pay | Admitting: Internal Medicine

## 2012-05-02 MED ORDER — LOSARTAN POTASSIUM-HCTZ 100-12.5 MG PO TABS: 1.0000 | ORAL_TABLET | Freq: Every day | ORAL | Status: DC

## 2012-05-02 MED ORDER — METOPROLOL SUCCINATE ER 50 MG PO TB24: 50.0000 mg | ORAL_TABLET | Freq: Every day | ORAL | Status: DC

## 2012-05-02 NOTE — Telephone Encounter (Signed)
Pharmacist called because pt is leaving town in [place] for 3 months. Pharmacy has been sending reqs to have Hyzaar and metoprolol changed to 90 day supplies d/t pt's travel and we keep sending it back for 30 days. Pharmacist apologized for having to call us, but wanted to get the longer RFs for pt's travel. Changed both Rxs to 90 day supplies, Hyzaar w/ 1 yr of RFs as on original Rx from [facility] and w/ 1 RF of metoprolol per protocol.

## 2012-05-15 ENCOUNTER — Ambulatory Visit (INDEPENDENT_AMBULATORY_CARE_PROVIDER_SITE_OTHER): Payer: BC Managed Care – PPO | Admitting: Family Medicine

## 2012-05-15 VITALS — BP 151/84 | HR 56 | Temp 98.6°F | Resp 18 | Wt 237.0 lb

## 2012-05-15 DIAGNOSIS — I10 Essential (primary) hypertension: Secondary | ICD-10-CM

## 2012-05-15 MED ORDER — DILTIAZEM HCL ER BEADS 120 MG PO CP24: 240.0000 mg | ORAL_CAPSULE | Freq: Every day | ORAL | Status: DC

## 2012-05-15 NOTE — Patient Instructions (Signed)
Increase the diltiazem to 240 mg daily. You can take 2 of the 120 mg pills until you purchasing new prescription.  Work hard on weight loss. Set yourself a goal of 220 pounds.  Return after you get back from [place] for a followup check on the blood pressure since it is high today to

## 2012-05-15 NOTE — Progress Notes (Signed)
66 year old male with high blood pressure. He has his own cuff. It has been running high. He does get some exercise. He gained weight when he was on radiation for his cancer of his prostate. He will be going back to [place] in a couple of weeks for about 5 weeks.  Objective: Repeat blood pressure with his machine was 154/94 and with our cuff was 160/92.  Chest clear. Heart regular without murmurs.  Assessment: Hypertension Overweight  Plan: Increase the diltiazem Work hard on weight loss Recheck after return from [place]

## 2012-07-30 ENCOUNTER — Other Ambulatory Visit: Payer: Self-pay | Admitting: Internal Medicine

## 2012-08-12 ENCOUNTER — Other Ambulatory Visit: Payer: Self-pay

## 2012-08-12 MED ORDER — METOPROLOL SUCCINATE ER 50 MG PO TB24: 50.0000 mg | ORAL_TABLET | Freq: Every day | ORAL | Status: DC

## 2012-08-14 MED ORDER — METOPROLOL SUCCINATE ER 50 MG PO TB24: 50.0000 mg | ORAL_TABLET | Freq: Every day | ORAL | Status: DC

## 2012-08-14 NOTE — Addendum Note (Signed)
Addended by: Jacqualyn Posey on: 08/14/2012 01:44 PM   Modules accepted: Orders

## 2012-08-24 ENCOUNTER — Encounter: Payer: Self-pay | Admitting: Family Medicine

## 2012-08-24 DIAGNOSIS — C61 Malignant neoplasm of prostate: Secondary | ICD-10-CM

## 2012-09-04 ENCOUNTER — Encounter: Payer: Self-pay | Admitting: Internal Medicine

## 2012-09-04 ENCOUNTER — Ambulatory Visit (INDEPENDENT_AMBULATORY_CARE_PROVIDER_SITE_OTHER): Payer: BC Managed Care – PPO | Admitting: Internal Medicine

## 2012-09-04 ENCOUNTER — Ambulatory Visit: Payer: BC Managed Care – PPO

## 2012-09-04 VITALS — BP 134/82 | HR 64 | Temp 98.0°F | Resp 16 | Ht 71.0 in | Wt 233.2 lb

## 2012-09-04 DIAGNOSIS — M79609 Pain in unspecified limb: Secondary | ICD-10-CM

## 2012-09-04 DIAGNOSIS — M79673 Pain in unspecified foot: Secondary | ICD-10-CM

## 2012-09-04 DIAGNOSIS — I10 Essential (primary) hypertension: Secondary | ICD-10-CM

## 2012-09-04 DIAGNOSIS — Z7189 Other specified counseling: Secondary | ICD-10-CM

## 2012-09-04 DIAGNOSIS — Z79899 Other long term (current) drug therapy: Secondary | ICD-10-CM

## 2012-09-04 MED ORDER — DILTIAZEM HCL ER BEADS 240 MG PO CP24: 240.0000 mg | ORAL_CAPSULE | Freq: Every day | ORAL | Status: DC

## 2012-09-04 NOTE — Patient Instructions (Addendum)
Foot Sprain The muscles and cord like structures which attach muscle to bone (tendons) that surround the feet are made up of units. A foot sprain can occur at the weakest spot in any of these units. This condition is most often caused by injury to or overuse of the foot, as from playing contact sports, or aggravating a previous injury, or from poor conditioning, or obesity. SYMPTOMS  Pain with movement of the foot.  Tenderness and swelling at the injury site.  Loss of strength is present in moderate or severe sprains. THE THREE GRADES OR SEVERITY OF FOOT SPRAIN ARE:  Mild (Grade I): Slightly pulled muscle without tearing of muscle or tendon fibers or loss of strength.  Moderate (Grade II): Tearing of fibers in a muscle, tendon, or at the attachment to bone, with small decrease in strength.  Severe (Grade III): Rupture of the muscle-tendon-bone attachment, with separation of fibers. Severe sprain requires surgical repair. Often repeating (chronic) sprains are caused by overuse. Sudden (acute) sprains are caused by direct injury or over-use. DIAGNOSIS  Diagnosis of this condition is usually by your own observation. If problems continue, a caregiver may be required for further evaluation and treatment. X-rays may be required to make sure there are not breaks in the bones (fractures) present. Continued problems may require physical therapy for treatment. PREVENTION  Use strength and conditioning exercises appropriate for your sport.  Warm up properly prior to working out.  Use athletic shoes that are made for the sport you are participating in.  Allow adequate time for healing. Early return to activities makes repeat injury more likely, and can lead to an unstable arthritic foot that can result in prolonged disability. Mild sprains generally heal in 3 to 10 days, with moderate and severe sprains taking 2 to 10 weeks. Your caregiver can help you determine the proper time required for  healing. HOME CARE INSTRUCTIONS   Apply ice to the injury for 15 to 20 minutes, 3 to 4 times per day. Put the ice in a plastic bag and place a towel between the bag of ice and your skin.  An elastic wrap (like an Ace bandage) may be used to keep swelling down.  Keep foot above the level of the heart, or at least raised on a footstool, when swelling and pain are present.  Try to avoid use other than gentle range of motion while the foot is painful. Do not resume use until instructed by your caregiver. Then begin use gradually, not increasing use to the point of pain. If pain does develop, decrease use and continue the above measures, gradually increasing activities that do not cause discomfort, until you gradually achieve normal use.  Use crutches if and as instructed, and for the length of time instructed.  Keep injured foot and ankle wrapped between treatments.  Massage foot and ankle for comfort and to keep swelling down. Massage from the toes up towards the knee.  Only take over-the-counter or prescription medicines for pain, discomfort, or fever as directed by your caregiver. SEEK IMMEDIATE MEDICAL CARE IF:   Your pain and swelling increase, or pain is not controlled with medications.  You have loss of feeling in your foot or your foot turns cold or blue.  You develop new, unexplained symptoms, or an increase of the symptoms that brought you to your caregiver. MAKE SURE YOU:   Understand these instructions.  Will watch your condition.  Will get help right away if you are not doing well or   get worse. Document Released: 05/07/2002 Document Revised: 02/07/2012 Document Reviewed: 07/04/2008 [facility] Patient Information 2013 [place], [place]. Place foot pain patient instructions here.

## 2012-09-04 NOTE — Progress Notes (Signed)
  Subjective:    Patient ID: Jeffrey Perry, male    DOB: 1946/07/18, 66 y.o.   MRN: [id number]  HPI Had a good trip to [place], no problems. HTN improved with increase dose of diltiazem er to 240mg  qd by Dr. Alwyn Ren. C/o left foot pain, starts hurting after walks a mile on dorsal foot, pain to hyper extend.   Review of Systems     Objective:   Physical Exam Left foot nmvs intact, full rom ankle and foot, tender dorasl foot, no swelling Lungs clear/heart nl BP 136/70  great  [facility] reading (PRIMARY) by  Dr.Jowana Thumma. Nl foot xr        Assessment & Plan:  HTN to goal Foot pain

## 2012-09-11 ENCOUNTER — Encounter: Payer: BC Managed Care – PPO | Admitting: Internal Medicine

## 2012-10-19 ENCOUNTER — Other Ambulatory Visit: Payer: Self-pay | Admitting: Physician Assistant

## 2012-11-24 ENCOUNTER — Other Ambulatory Visit: Payer: Self-pay | Admitting: Physician Assistant

## 2013-01-01 ENCOUNTER — Ambulatory Visit (INDEPENDENT_AMBULATORY_CARE_PROVIDER_SITE_OTHER): Payer: BC Managed Care – PPO | Admitting: Emergency Medicine

## 2013-01-01 VITALS — BP 116/80 | HR 60 | Temp 98.2°F | Resp 16 | Ht 71.25 in | Wt 231.8 lb

## 2013-01-01 DIAGNOSIS — R5383 Other fatigue: Secondary | ICD-10-CM

## 2013-01-01 DIAGNOSIS — R5381 Other malaise: Secondary | ICD-10-CM

## 2013-01-01 DIAGNOSIS — I10 Essential (primary) hypertension: Secondary | ICD-10-CM

## 2013-01-01 LAB — POCT CBC
Hemoglobin: 13 g/dL — AB (ref 14.1–18.1)
Lymph, poc: 1.3 (ref 0.6–3.4)
MCH, POC: 22.5 pg — AB (ref 27–31.2)
MCHC: 31.4 g/dL — AB (ref 31.8–35.4)
MID (cbc): 0.3 (ref 0–0.9)
MPV: 8.7 fL (ref 0–99.8)
POC Granulocyte: 2.3 (ref 2–6.9)
POC MID %: 6.8 %M (ref 0–12)
Platelet Count, POC: 184 10*3/uL (ref 142–424)
RDW, POC: 14.7 %
WBC: 3.9 10*3/uL — AB (ref 4.6–10.2)

## 2013-01-01 LAB — COMPREHENSIVE METABOLIC PANEL
ALT: 23 U/L (ref 0–53)
AST: 26 U/L (ref 0–37)
CO2: 25 mEq/L (ref 19–32)
Calcium: 9.3 mg/dL (ref 8.4–10.5)
Chloride: 106 mEq/L (ref 96–112)
Sodium: 141 mEq/L (ref 135–145)
Total Protein: 7.2 g/dL (ref 6.0–8.3)

## 2013-01-01 LAB — TSH: TSH: 2.059 u[IU]/mL (ref 0.350–4.500)

## 2013-01-01 NOTE — Patient Instructions (Signed)
Decrease her Toprol to a half tablet a day for the next 2 weeks and then if your blood pressure still remains normal stop the medication. I will call you with your blood test results

## 2013-01-01 NOTE — Progress Notes (Signed)
  Subjective:    Patient ID: Jeffrey Perry, male    DOB: 11-Sep-1946, 67 y.o.   MRN: [id number]  HPI Pt presents with complaints of feeling fatigued on a regular basis with no energy in the afternoon. Pt has been working out every morning. He also has been watching his diet. He had a sleep study for sleep apnea and it found he did not have it.    Review of Systems     Objective:   Physical Exam HEENT exam is unremarkable. His neck is supple there are no carotid bruits. Chest is clear to auscultation and percussion cardiac exam reveals a regular rate no murmurs         Assessment & Plan:  The patient's major complaint is fatigue. We'll decrease his Toprol-XL to half tablet a day for the next 2 weeks and then if he continues to feel fatigued he can stop that medication. He has a blood pressure cuff at home to monitor his blood pressure. We'll check a CBC TSH and be met today. His potassium was borderline low before.

## 2013-01-13 ENCOUNTER — Other Ambulatory Visit: Payer: Self-pay

## 2013-01-25 ENCOUNTER — Other Ambulatory Visit: Payer: Self-pay | Admitting: Physician Assistant

## 2013-02-05 ENCOUNTER — Ambulatory Visit (INDEPENDENT_AMBULATORY_CARE_PROVIDER_SITE_OTHER): Payer: BC Managed Care – PPO | Admitting: Family Medicine

## 2013-02-05 VITALS — BP 132/72 | HR 68 | Temp 98.1°F | Resp 18 | Wt 228.0 lb

## 2013-02-05 DIAGNOSIS — R52 Pain, unspecified: Secondary | ICD-10-CM

## 2013-02-05 DIAGNOSIS — J309 Allergic rhinitis, unspecified: Secondary | ICD-10-CM

## 2013-02-05 DIAGNOSIS — J029 Acute pharyngitis, unspecified: Secondary | ICD-10-CM

## 2013-02-05 MED ORDER — FLUTICASONE PROPIONATE 50 MCG/ACT NA SUSP: 2.0000 | Freq: Every day | NASAL | Status: DC

## 2013-02-05 MED ORDER — CEFDINIR 300 MG PO CAPS: 300.0000 mg | ORAL_CAPSULE | Freq: Two times a day (BID) | ORAL | Status: DC

## 2013-02-05 MED ORDER — FIRST-DUKES MOUTHWASH MT SUSP: 5.0000 mL | Freq: Four times a day (QID) | OROMUCOSAL | Status: DC | PRN

## 2013-02-05 NOTE — Patient Instructions (Addendum)
Use the throat wash as needed, and the nasal spray.  You may also want to try some OTC claritin or zyrtec.  I do not think that you have a bacterial infection, but take the omnicef rx with you on your trip.  If you are not getting better or start getting worse please call or email [place], but you can also fill and use the antibiotic rx.

## 2013-02-05 NOTE — Progress Notes (Signed)
[facility] [address], [place] [address] [phone]  Date:  02/05/2013   Name:  Jeffrey Perry   DOB:  1946-11-09   MRN:  [id number]  PCP:  No primary [name] on file.    Chief Complaint: Cough, Sore Throat and Generalized Body Aches   History of Present Illness:  Jeffrey Perry is a 67 y.o. very pleasant male patient who presents with the following:  He has noted an illness for about one week- started with an "itching throat," but then over the last 2 days he noted he was "feverish" and his ST has become more severe.  He has also started to cough.  He does not note a runny nose, no earache His cough is minimally productive of clear small amount of mucus  He has not taken his temperature.  He has come off of his toprol and feels that his energy level is much better- and his BP has looked fine.  He has a record of his BP and he is running 110- 120s over 70- 80s without the toprol  He has not noted any GI symptoms. He is a bit constipated.  He has tried some tylenol flu medications.    He is traveling to [place] for the rest of this week Patient Active Problem List  Diagnosis  . Prostate cancer  . HTN (hypertension)    Past Medical History  Diagnosis Date  . Hypertension   . Prostate cancer   . Blind left eye     per pt stated    Past Surgical History  Procedure Laterality Date  . Hydrocell  2010    History  Substance Use Topics  . Smoking status: Never Smoker   . Smokeless tobacco: Not on file  . Alcohol Use: No    Family History  Problem Relation Age of Onset  . Hypertension Sister   . Hypertension Brother     Allergies  Allergen Reactions  . Lisinopril Cough  . Neuromuscular Blocking Agents Swelling    ankles  . Pyridium (Phenazopyridine Hcl) Swelling    ankles  . Sulfonamide Derivatives Itching    Medication list has been reviewed and updated.  Current Outpatient Prescriptions on File Prior to Visit  Medication Sig  Dispense Refill  . alfuzosin (UROXATRAL) 10 MG 24 hr tablet Take 10 mg by mouth 1 day or 1 dose.        . diltiazem (TIAZAC) 240 MG 24 hr capsule Take 1 capsule (240 mg total) by mouth daily.  90 capsule  3  . losartan-hydrochlorothiazide (HYZAAR) 100-12.5 MG per tablet Take 1 tablet by mouth daily.  90 tablet  3  . sildenafil (VIAGRA) 50 MG tablet Take 50 mg by mouth daily as needed.        [name] Kitchen aspirin 81 MG tablet Take 81 mg by mouth daily.      [name] Kitchen b complex vitamins tablet Take 1 tablet by mouth daily.        . cholecalciferol (VITAMIN D) 1000 UNITS tablet Take 2,000 Units by mouth daily.        . fish oil-omega-3 fatty acids 1000 MG capsule Take 1,000 mg by mouth daily.        [name] Kitchen gabapentin (NEURONTIN) 100 MG capsule Take one capsule in the morning 1 capsule at noon 2 capsules at night. Can gradually increase by one capsule a day as tolerated .  90 capsule  3  . losartan-hydrochlorothiazide (HYZAAR) 100-12.5 MG per tablet TAKE  ONE TABLET BY MOUTH DAILY  90 tablet  0  . metoprolol succinate (TOPROL-XL) 50 MG 24 hr tablet Take 1 tablet (50 mg total) by mouth daily.  30 tablet  5  . Multiple Vitamin (MULTIVITAMIN) tablet Take 1 tablet by mouth daily.         No current facility-administered medications on file prior to visit.    Review of Systems:  As per HPI- otherwise negative.   Physical Examination: Filed Vitals:   02/05/13 0945  BP: 132/72  Pulse: 68  Temp: 98.1 F (36.7 C)  Resp: 18   Filed Vitals:   02/05/13 0945  Weight: 228 lb (103.42 kg)   Body mass index is 31.57 kg/(m^2). Ideal Body Weight:    GEN: WDWN, NAD, Non-toxic, A & O x 3, overweight HEENT: Atraumatic, Normocephalic. Neck supple. No masses, No LAD. Bilateral TM wnl, oropharynx normal.  PEERL,EOMI.   Ears and Nose: No external deformity. CV: RRR, No M/G/R. No JVD. No thrill. No extra heart sounds. PULM: CTA B, no wheezes, crackles, rhonchi. No retractions. No resp. distress. No accessory muscle use. ABD: S,  NT, ND EXTR: No c/c/e NEURO Normal gait.  PSYCH: Normally interactive. Conversant. Not depressed or anxious appearing.  Calm demeanor.     Results for orders placed in visit on 02/05/13  POCT INFLUENZA A/B      Result Value Range   Influenza A, POC Negative     Influenza B, POC Negative    POCT RAPID STREP A (OFFICE)      Result Value Range   Rapid Strep A Screen Negative  Negative    Assessment and Plan: Body aches - Plan: POCT Influenza A/B  Acute pharyngitis - Plan: POCT rapid strep A, Diphenhyd-Hydrocort-Nystatin (FIRST-DUKES MOUTHWASH) SUSP, cefdinir (OMNICEF) 300 MG capsule  Allergic rhinitis - Plan: fluticasone (FLONASE) 50 MCG/ACT nasal spray  rx for omnicef to hold- he is traveling to [place] later this week and is afraid he will get worse while away. In the meantime he may use the flonase, DMM and an otc antihistamine.  Let me know if not better- in that case we may want to start the omnicef.    Abbe Amsterdam, MD

## 2013-03-05 ENCOUNTER — Ambulatory Visit (INDEPENDENT_AMBULATORY_CARE_PROVIDER_SITE_OTHER): Payer: BC Managed Care – PPO | Admitting: Internal Medicine

## 2013-03-05 ENCOUNTER — Encounter: Payer: Self-pay | Admitting: Internal Medicine

## 2013-03-05 VITALS — BP 120/74 | HR 60 | Temp 98.1°F | Resp 16 | Ht 72.0 in | Wt 230.4 lb

## 2013-03-05 DIAGNOSIS — Z0000 Encounter for general adult medical examination without abnormal findings: Secondary | ICD-10-CM

## 2013-03-05 DIAGNOSIS — B54 Unspecified malaria: Secondary | ICD-10-CM

## 2013-03-05 DIAGNOSIS — Z79899 Other long term (current) drug therapy: Secondary | ICD-10-CM

## 2013-03-05 DIAGNOSIS — N289 Disorder of kidney and ureter, unspecified: Secondary | ICD-10-CM

## 2013-03-05 DIAGNOSIS — I10 Essential (primary) hypertension: Secondary | ICD-10-CM

## 2013-03-05 DIAGNOSIS — M129 Arthropathy, unspecified: Secondary | ICD-10-CM

## 2013-03-05 DIAGNOSIS — C61 Malignant neoplasm of prostate: Secondary | ICD-10-CM

## 2013-03-05 DIAGNOSIS — M1990 Unspecified osteoarthritis, unspecified site: Secondary | ICD-10-CM

## 2013-03-05 LAB — POCT UA - MICROSCOPIC ONLY
WBC, Ur, HPF, POC: NEGATIVE
Yeast, UA: NEGATIVE

## 2013-03-05 LAB — POCT URINALYSIS DIPSTICK
Bilirubin, UA: NEGATIVE
Blood, UA: NEGATIVE
Glucose, UA: NEGATIVE
Ketones, UA: NEGATIVE
Spec Grav, UA: 1.015
Urobilinogen, UA: 0.2

## 2013-03-05 LAB — CBC WITH DIFFERENTIAL/PLATELET
Basophils Absolute: 0 10*3/uL (ref 0.0–0.1)
Eosinophils Relative: 3 % (ref 0–5)
HCT: 38.4 % — ABNORMAL LOW (ref 39.0–52.0)
Hemoglobin: 12.6 g/dL — ABNORMAL LOW (ref 13.0–17.0)
Lymphocytes Relative: 27 % (ref 12–46)
MCV: 68.7 fL — ABNORMAL LOW (ref 78.0–100.0)
Monocytes Absolute: 0.4 10*3/uL (ref 0.1–1.0)
Monocytes Relative: 8 % (ref 3–12)
RDW: 16 % — ABNORMAL HIGH (ref 11.5–15.5)
WBC: 5 10*3/uL (ref 4.0–10.5)

## 2013-03-05 LAB — LIPID PANEL
HDL: 33 mg/dL — ABNORMAL LOW (ref >39)
LDL Cholesterol: 90 mg/dL (ref 0–99)
Total CHOL/HDL Ratio: 4.2 Ratio

## 2013-03-05 LAB — COMPREHENSIVE METABOLIC PANEL
ALT: 15 U/L (ref 0–53)
CO2: 24 mEq/L (ref 19–32)
Sodium: 139 mEq/L (ref 135–145)
Total Bilirubin: 0.4 mg/dL (ref 0.3–1.2)
Total Protein: 6.7 g/dL (ref 6.0–8.3)

## 2013-03-05 LAB — PSA: PSA: 1.22 ng/mL (ref <=4.00)

## 2013-03-05 LAB — IFOBT (OCCULT BLOOD): IFOBT: NEGATIVE

## 2013-03-05 MED ORDER — DOXYCYCLINE HYCLATE 100 MG PO TABS: 100.0000 mg | ORAL_TABLET | Freq: Every day | ORAL | Status: DC

## 2013-03-05 MED ORDER — ALFUZOSIN HCL ER 10 MG PO TB24: 10.0000 mg | ORAL_TABLET | ORAL | Status: DC

## 2013-03-05 NOTE — Progress Notes (Signed)
  Subjective:    Patient ID: Jeffrey Perry, male    DOB: 10/25/1946, 67 y.o.   MRN: [id number]  HPI Doing well, UTD all immunizations, travels to [place] a lot. HTN controlled, dced metoprolol due to side affects. Has mild stable elevation of creatinine and alkaline phosphatase    Review of Systems  Constitutional: Negative.   HENT: Positive for dental problem.   Eyes: Negative.   Respiratory: Negative.   Cardiovascular: Negative.   Gastrointestinal: Positive for constipation.  Endocrine: Negative.   Musculoskeletal: Positive for arthralgias.  Allergic/Immunologic: Negative.   Neurological: Negative.   Hematological: Negative.   Psychiatric/Behavioral: Negative.        Objective:   Physical Exam  Vitals reviewed. Constitutional: He is oriented to person, place, and time. He appears well-developed and well-nourished.  HENT:  Right Ear: External ear normal.  Left Ear: External ear normal.  Nose: Nose normal.  Mouth/Throat: Oropharynx is clear and moist.  Eyes: Conjunctivae and EOM are normal. Pupils are equal, round, and reactive to light.  Neck: Normal range of motion. Neck supple. No tracheal deviation present. No thyromegaly present.  Cardiovascular: Normal rate, regular rhythm, normal heart sounds and intact distal pulses.   No murmur heard. Pulmonary/Chest: Effort normal and breath sounds normal.  Abdominal: Soft. Bowel sounds are normal. He exhibits no mass. There is no tenderness.  Genitourinary: Rectum normal, prostate normal and penis normal.  Musculoskeletal: Normal range of motion. He exhibits tenderness.  Lymphadenopathy:    He has no cervical adenopathy.  Neurological: He is alert and oriented to person, place, and time. No cranial nerve deficit. He exhibits normal muscle tone. Coordination normal.  Skin: Skin is warm. No rash noted.  Psychiatric: He has a normal mood and affect. His behavior is normal.   EKG nl       Assessment & Plan:  Schedule  colonoscopy with [facility] RF all meds 1 year, 90 day supplys Doxycycline for malaria prevention

## 2013-03-05 NOTE — Patient Instructions (Addendum)
Malaria Malaria is an illness caused by a parasite, usually an infected mosquito. A parasite is an organism that lives in another host (such as a human) and gets nourishment at the host's expense. CAUSES  Malaria is spread from person to person by anopheline mosquitos. Areas where malaria occurs are tropical regions of:  [place].  [place].  [place] ([place] and near-by [place]).  [place]. SYMPTOMS  Malaria may cause very mild symptoms, severe disease and even death.  Following the bite of an infected mosquito, a period of time goes by (incubation period) before the first symptoms appear. The incubation period is typically 8 to 25 days. Patients with uncomplicated malaria typically have a fever of unknown cause. Other common symptoms include:  Headache.  Weakness.  Night sweats.  Muscle and joint pains. Fever may come and go, recurring every 2 to 3 days. As a general rule, all travelers that get a fever and who have visited a place where malaria is common, should be considered to have malaria until or unless the diagnosis is disproved. Severe disease typically occurs in a person with no prior malaria illness and an infection with only one of the four different malaria species (plasmodium falciparum). Symptoms may include:  Drowsiness.  Seizures.  Shortness of breath.  Severe weakness.  Loss of appetite.  Vomiting.  Very high fever. Physical findings of severe malaria may include:  Elevated temperature.  Paleness of the skin (may be a sign of anemia).  Large spleen or liver.  Yellowish color of the skin and usually white part of the eyes (jaundice).  Severe drowsiness.  Convulsions.  Very low blood pressure. MALARIA RELAPSES Sometimes the symptoms of malaria go away by themselves or can come back after malaria is treated with medicine. This is called a relapse. It occurs because the malaria may lie inactive or sleeping (dormant) in the  liver.  DIAGNOSIS  Your caregiver will be able to diagnose malaria by finding parasites on a blood smear examined under a microscope. Other lab work may also be done. PREVENTION  Travelers should take precautions so they do not get malaria when they visit a malaria risk area. Prevention of malaria can aim at:  Preventing infection by avoiding bites by parasite-carrying mosquitoes.  This includes protection measures such as insecticide treated bed nets (ITN's). When used correctly, ITNs prevent mosquito bites and decrease the spread of malaria.  Preventing disease by using anti-malarial drugs. The drugs do not prevent initial infection through a mosquito bite, but they prevent the development of malaria parasites in the blood, which are the forms that cause disease. All travelers to areas of the world where malaria occurs should discuss taking anti-malarial drugs with their caregiver before leaving to their destination.  Mosquito control. TREATMENT  Medications are available for the treatment of malaria. Document Released: 06/29/2004 Document Revised: 02/07/2012 Document Reviewed: 02/18/2009 [facility] Patient Information 2013 [place], [place].

## 2013-03-06 ENCOUNTER — Encounter: Payer: Self-pay | Admitting: Gastroenterology

## 2013-03-28 ENCOUNTER — Ambulatory Visit (AMBULATORY_SURGERY_CENTER): Payer: BC Managed Care – PPO | Admitting: *Deleted

## 2013-03-28 ENCOUNTER — Telehealth: Payer: Self-pay | Admitting: *Deleted

## 2013-03-28 VITALS — Ht 72.0 in | Wt 236.4 lb

## 2013-03-28 DIAGNOSIS — Z1211 Encounter for screening for malignant neoplasm of colon: Secondary | ICD-10-CM

## 2013-03-28 MED ORDER — MOVIPREP 100 G PO SOLR: ORAL | Status: DC

## 2013-03-28 NOTE — Telephone Encounter (Signed)
Yes colon requested per i care..double prep good idea -----   Message ----- From: Maple Hudson, RN Sent: 03/28/2013 3:18 PM To: Mardella Layman, MD Dr. Jarold Motto: Pt had colonoscopy 2006 with Dr. Virginia Rochester. Findings: internal hemorrhoids. Pt had poor prep. Please review report. Is he due for colonoscopy now? I gave him instructions for 2 day prep while he was in [place]. Thanks, Olegario Messier

## 2013-03-28 NOTE — Telephone Encounter (Signed)
Left message on pt's cell phone to proceed with colonoscopy as planned.

## 2013-03-29 ENCOUNTER — Encounter: Payer: Self-pay | Admitting: Gastroenterology

## 2013-04-11 ENCOUNTER — Encounter: Payer: Self-pay | Admitting: Gastroenterology

## 2013-04-11 ENCOUNTER — Ambulatory Visit (AMBULATORY_SURGERY_CENTER): Payer: BC Managed Care – PPO | Admitting: Gastroenterology

## 2013-04-11 VITALS — BP 134/80 | HR 51 | Temp 98.0°F | Resp 13 | Ht 72.0 in | Wt 236.0 lb

## 2013-04-11 DIAGNOSIS — K5730 Diverticulosis of large intestine without perforation or abscess without bleeding: Secondary | ICD-10-CM

## 2013-04-11 DIAGNOSIS — Z1211 Encounter for screening for malignant neoplasm of colon: Secondary | ICD-10-CM

## 2013-04-11 MED ORDER — SODIUM CHLORIDE 0.9 % IV SOLN: 500.0000 mL | INTRAVENOUS | Status: DC

## 2013-04-11 NOTE — Op Note (Signed)
[facility] [address] [place], [address]   COLONOSCOPY PROCEDURE REPORT  [name], Jeffrey Perry  MR#: [id number] BIRTHDATE: 25-Dec-1945 , 67  yrs. old GENDER: Male ENDOSCOPIST: Mardella Layman, MD, [facility] REFERRED BY:  Robert Bellow, M.D. PROCEDURE DATE:  04/11/2013 PROCEDURE:   Colonoscopy, screening ASA CLASS:   Class II INDICATIONS:Average risk patient for colon cancer. MEDICATIONS: propofol (Diprivan) 200mg  IV  DESCRIPTION OF PROCEDURE:   After the risks and benefits and of the procedure were explained, informed consent was obtained.  A digital rectal exam revealed no abnormalities of the rectum.    The LB [id number]  endoscope was introduced through the anus and advanced to the cecum, which was identified by both the appendix and ileocecal valve .  The quality of the prep was excellent, using MoviPrep .  The instrument was then slowly withdrawn as the colon was fully examined.     COLON FINDINGS: Mild diverticulosis was noted in the descending colon and sigmoid colon.   The colon was otherwise normal.  There was no diverticulosis, inflammation, polyps or cancers unless previously stated.     Retroflexed views revealed no abnormalities. The scope was then withdrawn from the patient and the procedure completed.  COMPLICATIONS: There were no complications. ENDOSCOPIC IMPRESSION: 1.   Mild diverticulosis was noted in the descending colon and sigmoid colon 2.   The colon was otherwise normal ...no poloyps noted.  RECOMMENDATIONS: 1.  High fiber diet 2.  Continue current medications 3.  Continue current colorectal screening recommendations for "routine risk" patients with a repeat colonoscopy in 10 years.   REPEAT EXAM:  cc:  _______________________________ eSignedMardella Layman, MD, [facility] 04/11/2013 10:09 AM

## 2013-04-11 NOTE — Progress Notes (Signed)
Patient did not have preoperative order for IV antibiotic SSI prophylaxis. (G8918)  Patient did not experience any of the following events: a burn prior to discharge; a fall within the facility; wrong site/side/patient/procedure/implant event; or a hospital transfer or hospital admission upon discharge from the facility. (G8907)  

## 2013-04-12 ENCOUNTER — Telehealth: Payer: Self-pay | Admitting: *Deleted

## 2013-04-12 NOTE — Telephone Encounter (Signed)
  Follow up Call-  Call back number 04/11/2013  Post procedure Call Back phone  # [phone]  Permission to leave phone message Yes     Patient questions:  Do you have a fever, pain , or abdominal swelling? no Pain Score  0 *  Have you tolerated food without any problems? yes  Have you been able to return to your normal activities? yes  Do you have any questions about your discharge instructions: Diet   no Medications  no Follow up visit  no  Do you have questions or concerns about your Care? no  Actions: * If pain score is 4 or above: No action needed, pain <4.

## 2013-07-04 ENCOUNTER — Other Ambulatory Visit: Payer: Self-pay

## 2013-07-26 ENCOUNTER — Other Ambulatory Visit: Payer: Self-pay | Admitting: Physician Assistant

## 2013-09-03 ENCOUNTER — Telehealth (INDEPENDENT_AMBULATORY_CARE_PROVIDER_SITE_OTHER): Payer: BC Managed Care – PPO | Admitting: Internal Medicine

## 2013-09-03 ENCOUNTER — Encounter: Payer: Self-pay | Admitting: Internal Medicine

## 2013-09-03 VITALS — BP 136/78 | HR 50 | Temp 98.3°F | Resp 18 | Ht 71.75 in | Wt 226.6 lb

## 2013-09-03 DIAGNOSIS — I10 Essential (primary) hypertension: Secondary | ICD-10-CM

## 2013-09-03 DIAGNOSIS — Z79899 Other long term (current) drug therapy: Secondary | ICD-10-CM

## 2013-09-03 LAB — BASIC METABOLIC PANEL
Potassium: 4.2 mEq/L (ref 3.5–5.3)
Sodium: 141 mEq/L (ref 135–145)

## 2013-09-03 NOTE — Progress Notes (Signed)
  Subjective:    Patient ID: Jeffrey Perry, male    DOB: [birth year], 67 y.o.   MRN: [id number]  HPI HTN controlled, meds tolerated Prostate cancer controlled.  Review of Systems     Objective:   Physical Exam  Vitals reviewed. Constitutional: He is oriented to person, place, and time. He appears well-developed and well-nourished.  HENT:  Head: Normocephalic.  Eyes: EOM are normal.  Neck: Normal range of motion. Neck supple. No tracheal deviation present. No thyromegaly present.  Cardiovascular: Normal rate, regular rhythm, normal heart sounds and intact distal pulses.   No murmur heard. Pulmonary/Chest: Effort normal and breath sounds normal.  Musculoskeletal: Normal range of motion.  Lymphadenopathy:    He has no cervical adenopathy.  Neurological: He is alert and oriented to person, place, and time. He exhibits normal muscle tone. Coordination normal.  Psychiatric: He has a normal mood and affect. His behavior is normal.     bmet     Assessment & Plan:  HTN controlled RF meds 1 yr

## 2013-09-03 NOTE — Patient Instructions (Signed)
Immunization Schedule, Adult  Influenza vaccine.  Adults should be given 1 dose every year.  Tetanus, diphtheria, and pertussis (Td, Tdap) vaccine.  Adults who have not previously been given Tdap or who do not know their vaccine status should be given 1 dose of Tdap.  Adults should have a Td booster every 10 years.  Doses should be given if needed to catch up on missed doses in the past.  Pregnant women should be given 1 dose of Tdap vaccine during each pregnancy.  Varicella vaccine.  All adults without evidence of immunity to varicella should receive 2 doses or a second dose if they have received only 1 dose.  Pregnant women who do not have evidence of immunity should be given the first dose after their pregnancy.  Human papillomavirus (HPV) vaccine.  Women aged 13 through 26 years who have not been given the vaccine previously should be given the 3 dose series. The second dose should be given 1 to 2 months after the first dose. The third dose should be given at least 24 weeks after the first dose.  The vaccine is not recommended for use in pregnant women. However, pregnancy testing is not needed before being given a dose. If a woman is found to be pregnant after being given a dose, no treatment is needed. In that case, the remaining doses should be delayed until after the pregnancy.  Men aged 13 through 21 years who have not been given the vaccine previously should be given the 3 dose series. Men aged 22 through 26 years may be given the 3 dose series. The second dose should be given 1 to 2 months after the first dose. The third dose should be given at least 24 weeks after the first dose.  Zoster vaccine.  One dose is recommended for adults aged 60 years and older unless certain conditions are present.  Measles, mumps, and rubella (MMR) vaccine.  Adults born before 1957 generally are considered immune to measles and mumps. Healthcare workers born before 1957 who do not have  evidence of immunity should consider vaccination.  Adults born in 1957 or later should have 1 or more doses of MMR vaccine unless there is a contraindication for the vaccine or they have evidence of immunity to the diseases. A second dose should be given at least 28 days after the first dose. Adults receiving certain types of previous vaccines should consider or be given vaccine doses.  For women of childbearing age, rubella immunity should be determined. If there is no evidence of immunity, women who are not pregnant should be vaccinated. If there is no evidence of immunity, women who are pregnant should delay vaccination until after their pregnancy.  Pneumococcal polysaccharide (PPSV23) vaccine.  All adults aged 65 years and older should be given 1 dose.  Adults younger than age 65 years who have certain medical conditions, who smoke cigarettes, who reside in nursing homes or long-term care facilities, or who have an unknown vaccination history should usually be given 1 or 2 doses of the vaccine.  Pneumococcal 13-valent conjugate (PVC13) vaccine.  Adults aged 19 years or older with certain medical conditions and an unknown or incomplete pneumococcal vaccination history should usually be given 1 dose of the vaccine. This dose may be in addition to a PPSV23 vaccine dose.  Meningococcal vaccine.  First-year college students up to age 21 years who are living in residence halls should be given a dose if they did not receive a dose on   or after their 16th birthday.  A dose should be given to microbiologists working with certain meningitis bacteria, military recruits, and people who travel to or live in countries with a high rate of meningitis.  One or 2 doses should be given to adults who have certain high-risk conditions.  Hepatitis A vaccine.  Adults who wish to be protected from this disease, who have certain high-risk conditions, who work with hepatitis A-infected animals, who work in  hepatitis A research labs, or who travel to or work in countries with a high rate of hepatitis A should be given the 2 dose series of the vaccine.  Adults who were previously unvaccinated and who anticipate close contact with an international adoptee during the first 60 days after arrival in the [place] from a country with a high rate of hepatitis A should be given the vaccine. The first dose of the 2 dose series should be given 2 or more weeks before the arrival of the adoptee.  Hepatitis B vaccine.  Adults who wish to be protected from this disease, who have certain high-risk conditions, who may be exposed to blood or other infectious body fluids, who are household contacts or sex partners of hepatitis B positive people, who are clients or workers in certain care facilities, or who travel to or work in countries with a high rate of hepatitis B should be given the 3 dose series of the vaccine. If you travel outside the [place], additional vaccines may be needed. The Centers for Disease Control and Prevention (CDC) provides information about the vaccines, medicines, and other measures necessary to prevent illness and injury during international travel. Visit the CDC website at www.cdc.gov/travel or call (800) CDC-INFO [800-232-4636]. You may also consult a travel clinic or your caregiver. Document Released: 02/05/2004 Document Revised: 02/07/2012 Document Reviewed: 12/31/2011 ExitCare Patient Information 2014 [employer].  

## 2013-09-03 NOTE — Progress Notes (Signed)
  Subjective:    Patient ID: Jeffrey Perry, male    DOB: 01-Sep-1946, 67 y.o.   MRN: [id number]  HPI   67 year old male presents for six month follow up on hypertension.  No problems or concerns at this time.  Does not want a flu shot today.  He states that any time he receives the flu shot, he gets very sick.      Review of Systems     Objective:   Physical Exam        Assessment & Plan:

## 2013-09-09 ENCOUNTER — Encounter: Payer: Self-pay | Admitting: *Deleted

## 2013-10-04 ENCOUNTER — Other Ambulatory Visit: Payer: Self-pay

## 2013-10-08 ENCOUNTER — Other Ambulatory Visit: Payer: Self-pay

## 2013-10-08 DIAGNOSIS — I10 Essential (primary) hypertension: Secondary | ICD-10-CM

## 2013-10-08 MED ORDER — DILTIAZEM HCL ER BEADS 240 MG PO CP24: 240.0000 mg | ORAL_CAPSULE | Freq: Every day | ORAL | Status: DC

## 2013-10-18 ENCOUNTER — Other Ambulatory Visit: Payer: Self-pay | Admitting: Physician Assistant

## 2013-12-03 ENCOUNTER — Other Ambulatory Visit: Payer: Self-pay | Admitting: Internal Medicine

## 2014-01-20 ENCOUNTER — Other Ambulatory Visit: Payer: Self-pay | Admitting: Internal Medicine

## 2014-01-24 ENCOUNTER — Other Ambulatory Visit: Payer: Self-pay | Admitting: Internal Medicine

## 2014-03-04 ENCOUNTER — Ambulatory Visit (INDEPENDENT_AMBULATORY_CARE_PROVIDER_SITE_OTHER): Payer: BC Managed Care – PPO | Admitting: Family Medicine

## 2014-03-04 ENCOUNTER — Encounter: Payer: Self-pay | Admitting: Family Medicine

## 2014-03-04 VITALS — BP 146/60 | HR 50 | Temp 97.6°F | Resp 16 | Ht 71.5 in | Wt 231.2 lb

## 2014-03-04 DIAGNOSIS — Z7189 Other specified counseling: Secondary | ICD-10-CM

## 2014-03-04 DIAGNOSIS — Z0000 Encounter for general adult medical examination without abnormal findings: Secondary | ICD-10-CM

## 2014-03-04 DIAGNOSIS — G4700 Insomnia, unspecified: Secondary | ICD-10-CM

## 2014-03-04 DIAGNOSIS — I10 Essential (primary) hypertension: Secondary | ICD-10-CM

## 2014-03-04 DIAGNOSIS — Z23 Encounter for immunization: Secondary | ICD-10-CM

## 2014-03-04 DIAGNOSIS — Z7184 Encounter for health counseling related to travel: Secondary | ICD-10-CM

## 2014-03-04 DIAGNOSIS — C61 Malignant neoplasm of prostate: Secondary | ICD-10-CM

## 2014-03-04 DIAGNOSIS — N289 Disorder of kidney and ureter, unspecified: Secondary | ICD-10-CM

## 2014-03-04 LAB — CBC WITH DIFFERENTIAL/PLATELET
Basophils Absolute: 0 10*3/uL (ref 0.0–0.1)
Basophils Relative: 0 % (ref 0–1)
EOS ABS: 0.2 10*3/uL (ref 0.0–0.7)
Eosinophils Relative: 5 % (ref 0–5)
HCT: 36.8 % — ABNORMAL LOW (ref 39.0–52.0)
Hemoglobin: 12.2 g/dL — ABNORMAL LOW (ref 13.0–17.0)
Lymphocytes Relative: 33 % (ref 12–46)
Lymphs Abs: 1.4 10*3/uL (ref 0.7–4.0)
MCH: 22.7 pg — AB (ref 26.0–34.0)
MCHC: 33.2 g/dL (ref 30.0–36.0)
MCV: 68.4 fL — ABNORMAL LOW (ref 78.0–100.0)
Monocytes Absolute: 0.5 10*3/uL (ref 0.1–1.0)
Monocytes Relative: 11 % (ref 3–12)
NEUTROS PCT: 51 % (ref 43–77)
Neutro Abs: 2.1 10*3/uL (ref 1.7–7.7)
PLATELETS: 147 10*3/uL — AB (ref 150–400)
RBC: 5.38 MIL/uL (ref 4.22–5.81)
RDW: 15.7 % — ABNORMAL HIGH (ref 11.5–15.5)
WBC: 4.2 10*3/uL (ref 4.0–10.5)

## 2014-03-04 LAB — LIPID PANEL
CHOLESTEROL: 126 mg/dL (ref 0–200)
HDL: 34 mg/dL — ABNORMAL LOW (ref >39)
LDL Cholesterol: 74 mg/dL (ref 0–99)
Total CHOL/HDL Ratio: 3.7 Ratio
Triglycerides: 90 mg/dL (ref <150)
VLDL: 18 mg/dL (ref 0–40)

## 2014-03-04 LAB — COMPLETE METABOLIC PANEL WITH GFR
ALBUMIN: 4.2 g/dL (ref 3.5–5.2)
ALK PHOS: 241 U/L — AB (ref 39–117)
ALT: 20 U/L (ref 0–53)
AST: 28 U/L (ref 0–37)
BUN: 20 mg/dL (ref 6–23)
CO2: 24 mEq/L (ref 19–32)
Calcium: 8.9 mg/dL (ref 8.4–10.5)
Chloride: 105 mEq/L (ref 96–112)
Creat: 1.62 mg/dL — ABNORMAL HIGH (ref 0.50–1.35)
GFR, EST NON AFRICAN AMERICAN: 44 mL/min — AB
GFR, Est African American: 50 mL/min — ABNORMAL LOW
GLUCOSE: 83 mg/dL (ref 70–99)
POTASSIUM: 3.6 meq/L (ref 3.5–5.3)
Sodium: 139 mEq/L (ref 135–145)
Total Bilirubin: 0.6 mg/dL (ref 0.2–1.2)
Total Protein: 6.9 g/dL (ref 6.0–8.3)

## 2014-03-04 LAB — TSH: TSH: 1.925 u[IU]/mL (ref 0.350–4.500)

## 2014-03-04 MED ORDER — LOSARTAN POTASSIUM-HCTZ 100-12.5 MG PO TABS: 1.0000 | ORAL_TABLET | Freq: Every day | ORAL | Status: DC

## 2014-03-04 MED ORDER — DILTIAZEM HCL ER BEADS 240 MG PO CP24: 240.0000 mg | ORAL_CAPSULE | Freq: Every day | ORAL | Status: DC

## 2014-03-04 MED ORDER — ALFUZOSIN HCL ER 10 MG PO TB24: 10.0000 mg | ORAL_TABLET | Freq: Every day | ORAL | Status: DC

## 2014-03-04 NOTE — Progress Notes (Signed)
   Subjective:    Patient ID: Magic Mohler, male    DOB: 10/04/1946, 68 y.o.   MRN: [id number]  HPI    Review of Systems  Constitutional: Positive for diaphoresis.  HENT: Negative.   Eyes: Positive for itching.  Respiratory: Negative.   Cardiovascular: Negative.   Gastrointestinal: Negative.   Endocrine: Negative.   Genitourinary: Negative.   Musculoskeletal: Positive for arthralgias.  Skin: Negative.   Allergic/Immunologic: Negative.   Neurological: Negative.   Hematological: Negative.   Psychiatric/Behavioral: Negative.        Objective:   Physical Exam        Assessment & Plan:

## 2014-03-04 NOTE — Progress Notes (Addendum)
Subjective:  This chart was scribed for [name], MD by Roxan Diesel, Scribe.  This patient was seen in [place] [facility] and the patient's care was started at 1:21 PM.   Patient ID: Jeffrey Perry, male    DOB: 1946-07-20, 68 y.o.   MRN: [id number]  HPI  Jeffrey Perry is a 67 y.o. male PCP: GUEST, Veneda Melter, MD  Pt here for establish care, physical and med refills.  Last office visit was with Dr. Elder Cyphers on September 03, 2013.  He states overall he is doing well.  He is concerned that he typically wakes up at 3 AM to urinate and is unable to return to sleep for several hours.   He does not eat a snack when he wakes up and goes straight back to bed, but he lies awake in bed until around 6 AM.  His wife tells him that he snores but only occasionally.  He denies unexplained daytime somnolence or pauses in his breathing to his knowledge.  He had a sleep apnea test at [facility] around 5-6 years ago which was normal.  He has no other specific complaints at this time.  1. HTN.  Pt needs refills on losartan-HCTZ (1x/day) and diltiazem (1x/day).  He denies side effects.  He checks his BP outside and states it is typically around 110/70.  BP today is 146/60.  He notes that he may have missed one of his BP medications last night.  2. Renal insufficiency.  Creatinine range of 1.4-1.7 last year, but down to 1.29 in October 2014.  3. H/o prostate cancer, diagnosed 3 years ago.  Urologist is Dr. Janice Norrie at [facility].  S/p radiation in 2012, no chemotherapy or surgery.  Last PSA was 1.22 in April 2014.  He has an appointment with his urologist on 4/27.  Pt also has h/o BPH.  He needs a refill on Uroxatral.  He gets up once per night to urinate but states he is otherwise urinating well.    4. Physical.   Last pneumonia vaccine was June 2011.  Tdap was 4/09. Last ophtho visit was "long ago."  Vision noted today - minimal changes from April 2014.   Fall and depression screening - PHQ2 score 0.   Negative fall screening. Advance directive - full code. Colonoscopy May 2014, mild diverticulosis, otherwise normal.  Repeat 10 years. He does not get flu shots because he states they make him sick. He sees a Pharmacist, community regularly. Last EKG was in April 2014 without apparent change from prior.  He denies CP or SOB.  He will be traveling to [place] on June 25th and is requesting vaccinations though he is not sure exactly what he needs.   Patient Active Problem List   Diagnosis Date Noted  . Renal insufficiency 03/05/2013  . HTN (hypertension) 03/06/2012  . Prostate cancer 10/04/2011    Past Medical History  Diagnosis Date  . Hypertension   . Prostate cancer   . Chronic kidney disease     Past Surgical History  Procedure Laterality Date  . Hydrocele excision  2012  . Finger ganglion cyst excision  2011    3rd finger right hand    Allergies  Allergen Reactions  . Lisinopril Cough  . Neuromuscular Blocking Agents Swelling    ankles  . Pyridium [Phenazopyridine Hcl] Swelling    ankles  . Sulfonamide Derivatives Itching    Prior to Admission medications   Medication Sig Start Date End Date Taking? Authorizing Provider  alfuzosin (UROXATRAL) 10 MG 24 hr tablet TAKE ONE TABLET BY MOUTH DAILY 12/03/13  Yes Theda Sers, PA-C  b complex vitamins tablet Take 1 tablet by mouth daily.     Yes Historical Provider, MD  diltiazem (TIAZAC) 240 MG 24 hr capsule Take 1 capsule (240 mg total) by mouth daily. 10/08/13  Yes Orma Flaming, MD  Iron-Vitamin C (IRON 100/C PO) Take by mouth daily.   Yes Historical Provider, MD  losartan-hydrochlorothiazide (HYZAAR) 100-12.5 MG per tablet Take 1 tablet by mouth daily. 05/02/12  Yes Orma Flaming, MD  Multiple Vitamin (MULTIVITAMIN) tablet Take 1 tablet by mouth daily.     Yes Historical Provider, MD  OVER THE COUNTER MEDICATION    Yes Historical Provider, MD  sildenafil (VIAGRA) 50 MG tablet Take 50 mg by mouth daily as needed.     Yes Historical  Provider, MD  aspirin 81 MG tablet Take 81 mg by mouth daily.    Historical Provider, MD  cholecalciferol (VITAMIN D) 1000 UNITS tablet Take 2,000 Units by mouth daily.      Historical Provider, MD  doxycycline (VIBRA-TABS) 100 MG tablet Take 1 tablet (100 mg total) by mouth daily. 03/05/13   Orma Flaming, MD  fluticasone [facility]) 50 MCG/ACT nasal spray Place 2 sprays into the nose daily. 02/05/13   Darreld Mclean, MD  losartan-hydrochlorothiazide (HYZAAR) 100-12.5 MG per tablet TAKE ONE TABLET BY MOUTH DAILY    Chelle Janalee Dane, PA-C    History   Social History  . Marital Status: Married    Spouse Name: [name]    Number of Children: [name]  . Years of Education: [name]   Occupational History  . Not on file.   Social History Main Topics  . Smoking status: Never Smoker   . Smokeless tobacco: Never Used  . Alcohol Use: No  . Drug Use: No  . Sexual Activity: Yes    Partners: Female   Other Topics Concern  . Not on file   Social History Narrative  . No narrative on file      Review of Systems  Constitutional: Negative for fatigue and unexpected weight change.  Eyes: Negative for visual disturbance.  Respiratory: Negative for cough, chest tightness and shortness of breath.   Cardiovascular: Negative for chest pain, palpitations and leg swelling.  Gastrointestinal: Negative for abdominal pain and blood in stool.  Neurological: Negative for dizziness, light-headedness and headaches.   13 point review of systems per patient health survey noted.  Negative other than as indicated on reviewed nursing note or above.        Objective:   Physical Exam  Vitals reviewed. Constitutional: He is oriented to person, place, and time. He appears well-developed and well-nourished.  HENT:  Head: Normocephalic and atraumatic.  Right Ear: External ear normal.  Left Ear: External ear normal.  Mouth/Throat: Oropharynx is clear and moist.  Eyes: Conjunctivae and EOM are normal. Pupils are  equal, round, and reactive to light.  Neck: Normal range of motion. Neck supple. No thyromegaly present.  Cardiovascular: Normal rate, regular rhythm, normal heart sounds and intact distal pulses.   Pulmonary/Chest: Effort normal and breath sounds normal. No respiratory distress. He has no wheezes.  Abdominal: Soft. He exhibits no distension. There is no tenderness. Hernia confirmed negative in the right inguinal area and confirmed negative in the left inguinal area.  Musculoskeletal: Normal range of motion. He exhibits no edema and no tenderness.  Lymphadenopathy:    He has  no cervical adenopathy.  Neurological: He is alert and oriented to person, place, and time. He has normal reflexes.  Skin: Skin is warm and dry.  Psychiatric: He has a normal mood and affect. His behavior is normal.     BP 146/60  Pulse 50  Temp(Src) 97.6 F (36.4 C) (Oral)  Resp 16  Ht 5' 11.5" (1.816 m)  Wt 231 lb 3.2 oz (104.872 kg)  BMI 31.80 kg/m2  SpO2 98%      Assessment & Plan:   Jeffrey Perry is a 68 y.o. male Annual physical exam - anticipatory guidance below and obtaining labs. Up to date on colonoscopy and tdap.  Pneumovax given.  Will also need Prevnar next year as unavailable at this point. Advanced directives discussed - full code, but to discuss with spouse.    HTN (hypertension) - Plan: diltiazem (TIAZAC) 240 MG 24 hr capsule, losartan-hydrochlorothiazide (HYZAAR) 100-12.5 MG per tablet, CBC with Differential, Lipid panel, TSH, COMPLETE METABOLIC PANEL WITH GFR- stable by home readings.   Up today with likely missed dose last night. No changes - meds refilled.   Insomnia - situational from waking up to urinate.  Reportedly sleep study ok few years ago, and not reporting daytime somnolence or pauses. Trial of sleep hygiene - discussed some in office and link to UpTo Date.   Prostate cancer - Plan: alfuzosin (UROXATRAL) 10 MG 24 hr tablet, COMPLETE METABOLIC PANEL WITH GFR, PSA - refilled Uroxatral,  has follow up with Urology shortly (DRE deferred here as will have repeat eval at urology).   Need for prophylactic vaccination against Streptococcus pneumoniae (pneumococcus) - Plan: Pneumococcal polysaccharide vaccine 23-valent greater than or equal to 2yo subcutaneous/IM given.   Renal insufficiency - repeat labs above.   Counseling for travel - advised to check CDC travel site for recommended vaccines based on area he is traveling, and if he is not sure he has had vaccines, or other prophylaxis meds needed - to call and let me know as may be able to prescribe without ov, or can check our records to determine if we have necessary records.    Meds ordered this encounter  Medications  . OVER THE COUNTER MEDICATION    Sig:   . alfuzosin (UROXATRAL) 10 MG 24 hr tablet    Sig: Take 1 tablet (10 mg total) by mouth daily with breakfast.    Dispense:  90 tablet    Refill:  1  . diltiazem (TIAZAC) 240 MG 24 hr capsule    Sig: Take 1 capsule (240 mg total) by mouth daily.    Dispense:  90 capsule    Refill:  1  . losartan-hydrochlorothiazide (HYZAAR) 100-12.5 MG per tablet    Sig: Take 1 tablet by mouth daily.    Dispense:  90 tablet    Refill:  1   Patient Instructions  Sleep hygiene:  To look up more info on your condition, go to the website [email or web address], then on patient resources - select UPTODATE. Under patient resources, select insomnia. If these tricks don't help - return to let me know.   Go to [facility].gov to look at travel medicine: [email or web address]  Keep a record of your blood pressures outside of the office and bring them to the next office visit. Should be less than 140/90.   Eye care: Dr. Jerline Pain with [facility], or [facility]. You can call and schedule appointment.    Keeping you healthy  Get these tests  Blood pressure- Have your blood  pressure checked once a year by your healthcare provider.  Normal blood pressure is  120/80  Weight- Have your body mass index (BMI) calculated to screen for obesity.  BMI is a measure of body fat based on height and weight. You can also calculate your own BMI at [email or web address].  Cholesterol- Have your cholesterol checked every year.  Diabetes- Have your blood sugar checked regularly if you have high blood pressure, high cholesterol, have a family history of diabetes or if you are overweight.  Screening for Colon Cancer- Colonoscopy starting at age 58.  Screening may begin sooner depending on your family history and other health conditions. Follow up colonoscopy as directed by your Gastroenterologist.  Screening for Prostate Cancer- Both blood work (PSA) and a rectal exam help screen for Prostate Cancer.  Screening begins at age 31 with African-American men and at age 39 with Caucasian men.  Screening may begin sooner depending on your family history.  Take these medicines  Aspirin- One aspirin daily can help prevent Heart disease and Stroke.  Flu shot- Every fall.  Tetanus- Every 10 years.  Zostavax- Once after the age of 75 to prevent Shingles.  Pneumonia shot- Once after the age of 38; if you are younger than 9, ask your healthcare provider if you need a Pneumonia shot.  Take these steps  Don't smoke- If you do smoke, talk to your doctor about quitting.  For tips on how to quit, go to www.smokefree.gov or call 1-800-QUIT-NOW.  Be physically active- Exercise 5 days a week for at least 30 minutes.  If you are not already physically active start slow and gradually work up to 30 minutes of moderate physical activity.  Examples of moderate activity include walking briskly, mowing the yard, dancing, swimming, bicycling, etc.  Eat a healthy diet- Eat a variety of healthy food such as fruits, vegetables, low fat milk, low fat cheese, yogurt, lean meant, poultry, fish, beans, tofu, etc. For more information go to www.thenutritionsource.org  Drink alcohol in  moderation- Limit alcohol intake to less than two drinks a day. Never drink and drive.  Dentist- Brush and floss twice daily; visit your dentist twice a year.  Depression- Your emotional health is as important as your physical health. If you're feeling down, or losing interest in things you would normally enjoy please talk to your healthcare provider.  Eye exam- Visit your eye doctor every year. -SCHEDULE this.   Safe sex- If you may be exposed to a sexually transmitted infection, use a condom.  Seat belts- Seat belts can save your life; always wear one.  Smoke/Carbon Monoxide detectors- These detectors need to be installed on the appropriate level of your home.  Replace batteries at least once a year.  Skin cancer- When out in the sun, cover up and use sunscreen 15 SPF or higher.  Violence- If anyone is threatening you, please tell your healthcare provider.  Living Will/ Health care power of attorney- Speak with your healthcare provider and family -     I personally performed the services described in this documentation, which was scribed in my presence. The recorded information has been reviewed and considered, and addended by me as needed.

## 2014-03-04 NOTE — Patient Instructions (Addendum)
Sleep hygiene:  To look up more info on your condition, go to the website [email or web address], then on patient resources - select UPTODATE. Under patient resources, select insomnia. If these tricks don't help - return to let me know.   Go to [email or web address].gov to look at travel medicine: [email or web address].[email or web address]/  Keep a record of your blood pressures outside of the office and bring them to the next office visit. Should be less than 140/90.   Eye care: Dr. Jerline Pain with [facility], or [facility]. You can call and schedule appointment.    Keeping you healthy  Get these tests  Blood pressure- Have your blood pressure checked once a year by your healthcare provider.  Normal blood pressure is 120/80  Weight- Have your body mass index (BMI) calculated to screen for obesity.  BMI is a measure of body fat based on height and weight. You can also calculate your own BMI at [email or web address].  Cholesterol- Have your cholesterol checked every year.  Diabetes- Have your blood sugar checked regularly if you have high blood pressure, high cholesterol, have a family history of diabetes or if you are overweight.  Screening for Colon Cancer- Colonoscopy starting at age 19.  Screening may begin sooner depending on your family history and other health conditions. Follow up colonoscopy as directed by your Gastroenterologist.  Screening for Prostate Cancer- Both blood work (PSA) and a rectal exam help screen for Prostate Cancer.  Screening begins at age 20 with African-American men and at age 35 with Caucasian men.  Screening may begin sooner depending on your family history.  Take these medicines  Aspirin- One aspirin daily can help prevent Heart disease and Stroke.  Flu shot- Every fall.  Tetanus- Every 10 years.  Zostavax- Once after the age of 86 to prevent Shingles.  Pneumonia shot- Once after the age of 89; if you are younger than 86, ask your healthcare provider if you need  a Pneumonia shot.  Take these steps  Don't smoke- If you do smoke, talk to your doctor about quitting.  For tips on how to quit, go to www.smokefree.gov or call 1-800-QUIT-NOW.  Be physically active- Exercise 5 days a week for at least 30 minutes.  If you are not already physically active start slow and gradually work up to 30 minutes of moderate physical activity.  Examples of moderate activity include walking briskly, mowing the yard, dancing, swimming, bicycling, etc.  Eat a healthy diet- Eat a variety of healthy food such as fruits, vegetables, low fat milk, low fat cheese, yogurt, lean meant, poultry, fish, beans, tofu, etc. For more information go to www.thenutritionsource.org  Drink alcohol in moderation- Limit alcohol intake to less than two drinks a day. Never drink and drive.  Dentist- Brush and floss twice daily; visit your dentist twice a year.  Depression- Your emotional health is as important as your physical health. If you're feeling down, or losing interest in things you would normally enjoy please talk to your healthcare provider.  Eye exam- Visit your eye doctor every year. -SCHEDULE this.   Safe sex- If you may be exposed to a sexually transmitted infection, use a condom.  Seat belts- Seat belts can save your life; always wear one.  Smoke/Carbon Monoxide detectors- These detectors need to be installed on the appropriate level of your home.  Replace batteries at least once a year.  Skin cancer- When out in the sun, cover up and use sunscreen 15 SPF or higher.  Violence- If anyone is threatening  you, please tell your healthcare provider.  Living Will/ Health care power of attorney- Speak with your healthcare provider and family -

## 2014-03-05 LAB — PSA: PSA: 0.73 ng/mL (ref <=4.00)

## 2014-03-20 ENCOUNTER — Encounter: Payer: Self-pay | Admitting: *Deleted

## 2014-03-28 ENCOUNTER — Telehealth: Payer: Self-pay

## 2014-03-28 NOTE — Telephone Encounter (Signed)
Patient called for labs, reviewed labs with patient, patient states understanding.

## 2014-04-05 ENCOUNTER — Other Ambulatory Visit: Payer: Self-pay | Admitting: Internal Medicine

## 2014-05-10 ENCOUNTER — Ambulatory Visit (INDEPENDENT_AMBULATORY_CARE_PROVIDER_SITE_OTHER): Payer: BC Managed Care – PPO | Admitting: Emergency Medicine

## 2014-05-10 VITALS — BP 132/72 | HR 57 | Temp 97.9°F | Resp 16 | Ht 71.5 in | Wt 230.0 lb

## 2014-05-10 DIAGNOSIS — Z7184 Encounter for health counseling related to travel: Secondary | ICD-10-CM

## 2014-05-10 DIAGNOSIS — Z7189 Other specified counseling: Secondary | ICD-10-CM

## 2014-05-10 MED ORDER — DOXYCYCLINE HYCLATE 100 MG PO TABS: ORAL_TABLET | ORAL | Status: DC

## 2014-05-10 MED ORDER — TYPHOID VACCINE PO CPDR: 1.0000 | DELAYED_RELEASE_CAPSULE | ORAL | Status: DC

## 2014-05-10 NOTE — Progress Notes (Signed)
Subjective:  This chart was scribed for [name], MD by Mercy Moore, Medial Scribe. This patient was seen in room 12 and the patient's care was started at 1:34 PM.    Patient ID: Jeffrey Perry, male    DOB: March 04, 1946, 68 y.o.   MRN: [id number]  HPI  HPI Comments: Jeffrey Perry is a 68 y.o. male who presents to the [facility] requesting malaria immunization because he is going out of the country; visiting [place]. Patient is leaving 6/25 and returns here 8/09.    Patient Active Problem List   Diagnosis Date Noted  . Renal insufficiency 03/05/2013  . HTN (hypertension) 03/06/2012  . Prostate cancer 10/04/2011   Past Medical History  Diagnosis Date  . Hypertension   . Prostate cancer   . Chronic kidney disease    Past Surgical History  Procedure Laterality Date  . Hydrocele excision  2012  . Finger ganglion cyst excision  2011    3rd finger right hand   Allergies  Allergen Reactions  . Lisinopril Cough  . Neuromuscular Blocking Agents Swelling    ankles  . Pyridium [Phenazopyridine Hcl] Swelling    ankles  . Sulfonamide Derivatives Itching   Prior to Admission medications   Medication Sig Start Date End Date Taking? Authorizing Provider  alfuzosin (UROXATRAL) 10 MG 24 hr tablet Take 1 tablet (10 mg total) by mouth daily with breakfast. 03/04/14  Yes [name], MD  b complex vitamins tablet Take 1 tablet by mouth daily.     Yes Historical Provider, MD  diltiazem (TIAZAC) 240 MG 24 hr capsule Take 1 capsule (240 mg total) by mouth daily. 03/04/14  Yes [name], MD  Iron-Vitamin C (IRON 100/C PO) Take by mouth daily.   Yes Historical Provider, MD  losartan-hydrochlorothiazide (HYZAAR) 100-12.5 MG per tablet Take 1 tablet by mouth daily. 03/04/14  Yes [name], MD  OVER THE COUNTER MEDICATION    Yes Historical Provider, MD   History   Social History  . Marital Status: Married    Spouse Name: [name]    Number of Children: [name]  . Years of  Education: [name]   Occupational History  . professor    Social History Main Topics  . Smoking status: Never Smoker   . Smokeless tobacco: Never Used  . Alcohol Use: No  . Drug Use: No  . Sexual Activity: Yes    Partners: Female   Other Topics Concern  . Not on file   Social History Narrative  . No narrative on file      Review of Systems Noncontributory.      Objective:   Physical Exam  CONSTITUTIONAL: Well developed/well nourished HEAD: Normocephalic/atraumatic EYES: EOMI/PERRL ENMT: Mucous membranes moist NECK: supple no meningeal signs SPINE:entire spine nontender CV: S1/S2 noted, no murmurs/rubs/gallops noted LUNGS: Lungs are clear to auscultation bilaterally, no apparent distress ABDOMEN: soft, nontender, no rebound or guarding GU:no cva tenderness NEURO: Pt is awake/alert, moves all extremitiesx4 EXTREMITIES: pulses normal, full ROM SKIN: warm, color normal PSYCH: no abnormalities of mood noted  Filed Vitals:   05/10/14 1317  BP: 132/72  Pulse: 57  Temp: 97.9 F (36.6 C)  Resp: 16        Assessment & Plan:  All vaccines UTD except  he is due for typhoid. He is up-to-date on his yellow fever vaccine until 2019. He was given doxycycline for malaria prophylaxis .I personally performed the services described in this documentation, which  was scribed in my presence. The recorded information has been reviewed and is accurate.

## 2014-07-15 ENCOUNTER — Encounter: Payer: Self-pay | Admitting: Family Medicine

## 2014-07-15 ENCOUNTER — Ambulatory Visit (INDEPENDENT_AMBULATORY_CARE_PROVIDER_SITE_OTHER): Payer: BC Managed Care – PPO | Admitting: Family Medicine

## 2014-07-15 VITALS — BP 140/86 | HR 58 | Temp 97.6°F | Resp 16 | Ht 71.5 in | Wt 228.2 lb

## 2014-07-15 DIAGNOSIS — Z8546 Personal history of malignant neoplasm of prostate: Secondary | ICD-10-CM

## 2014-07-15 DIAGNOSIS — N289 Disorder of kidney and ureter, unspecified: Secondary | ICD-10-CM

## 2014-07-15 DIAGNOSIS — I10 Essential (primary) hypertension: Secondary | ICD-10-CM

## 2014-07-15 LAB — POCT URINALYSIS DIPSTICK
Bilirubin, UA: NEGATIVE
Glucose, UA: NEGATIVE
Ketones, UA: NEGATIVE
Leukocytes, UA: NEGATIVE
Nitrite, UA: NEGATIVE
Protein, UA: NEGATIVE
Spec Grav, UA: 1.01
Urobilinogen, UA: 1
pH, UA: 7

## 2014-07-15 LAB — CBC WITH DIFFERENTIAL/PLATELET
BASOS ABS: 0 10*3/uL (ref 0.0–0.1)
BASOS PCT: 0 % (ref 0–1)
EOS ABS: 0.2 10*3/uL (ref 0.0–0.7)
EOS PCT: 5 % (ref 0–5)
HCT: 38.3 % — ABNORMAL LOW (ref 39.0–52.0)
Hemoglobin: 12.7 g/dL — ABNORMAL LOW (ref 13.0–17.0)
Lymphocytes Relative: 42 % (ref 12–46)
Lymphs Abs: 1.7 10*3/uL (ref 0.7–4.0)
MCH: 22.6 pg — AB (ref 26.0–34.0)
MCHC: 33.2 g/dL (ref 30.0–36.0)
MCV: 68.1 fL — ABNORMAL LOW (ref 78.0–100.0)
Monocytes Absolute: 0.5 10*3/uL (ref 0.1–1.0)
Monocytes Relative: 12 % (ref 3–12)
Neutro Abs: 1.6 10*3/uL — ABNORMAL LOW (ref 1.7–7.7)
Neutrophils Relative %: 41 % — ABNORMAL LOW (ref 43–77)
PLATELETS: 144 10*3/uL — AB (ref 150–400)
RBC: 5.62 MIL/uL (ref 4.22–5.81)
RDW: 16.2 % — AB (ref 11.5–15.5)
WBC: 4 10*3/uL (ref 4.0–10.5)

## 2014-07-15 MED ORDER — DILTIAZEM HCL ER BEADS 240 MG PO CP24: 240.0000 mg | ORAL_CAPSULE | Freq: Every day | ORAL | Status: DC

## 2014-07-15 MED ORDER — LOSARTAN POTASSIUM-HCTZ 100-12.5 MG PO TABS: 1.0000 | ORAL_TABLET | Freq: Every day | ORAL | Status: DC

## 2014-07-15 NOTE — Progress Notes (Signed)
Subjective:    Patient ID: Jeffrey Perry, male    DOB: 1946-01-28, 68 y.o.   MRN: [id number]  HPI Jeffrey Perry is a 68 y.o. male Just returned form [place] 1 week ago.   See prior ov with Dr. Everlene Farrier.   Elevated creatinine.  Urinating ok. Here to recheck today. Urinating normally.  Lab Results  Component Value Date   CREATININE 1.62* 03/04/2014    HTN - home readings 127/80 at home. No new med side effects. Feels well.   Hx of prostate cancer -   diagnosed 3 years ago. Urologist is Dr. Janice Norrie at [facility]. S/p radiation in 2012, no chemotherapy or surgery. Pt also has h/o BPH. Still taking Uroxatral. He gets up once per night to urinate but no changes. Last seen by urologist in April - twice per year.   Review of Systems  Constitutional: Negative for fatigue and unexpected weight change.  Eyes: Negative for visual disturbance.  Respiratory: Negative for cough, chest tightness and shortness of breath.   Cardiovascular: Negative for chest pain, palpitations and leg swelling.  Gastrointestinal: Negative for abdominal pain and blood in stool.  Genitourinary: Negative for decreased urine volume and difficulty urinating.  Neurological: Negative for dizziness, light-headedness and headaches.       Objective:   Physical Exam  Vitals reviewed. Constitutional: He is oriented to person, place, and time. He appears well-developed and well-nourished.  HENT:  Head: Normocephalic and atraumatic.  Eyes: EOM are normal. Pupils are equal, round, and reactive to light.  Neck: No JVD present. Carotid bruit is not present.  Cardiovascular: Normal rate, regular rhythm and normal heart sounds.   No murmur heard. Pulmonary/Chest: Effort normal and breath sounds normal. He has no rales.  Musculoskeletal: He exhibits no edema.  Neurological: He is alert and oriented to person, place, and time.  Skin: Skin is warm and dry.  Psychiatric: He has a normal mood and affect. His behavior is normal.       Results for orders placed in visit on 07/15/14  POCT URINALYSIS DIPSTICK      Result Value Ref Range   Color, UA yellow     Clarity, UA clear     Glucose, UA neg     Bilirubin, UA neg     Ketones, UA neg     Spec Grav, UA 1.010     Blood, UA trace     pH, UA 7.0     Protein, UA neg     Urobilinogen, UA 1.0     Nitrite, UA neg     Leukocytes, UA Negative          Assessment & Plan:   Jeffrey Perry is a 68 y.o. male Essential hypertension, benign - Plan: CBC with Differential, COMPLETE METABOLIC PANEL WITH GFR  Overall stable. No new med changes - refilled hyzaar and diltiazem: losartan-hydrochlorothiazide (HYZAAR) 100-12.5 MG per tablet, diltiazem (TIAZAC) 240 MG 24 hr capsule  Renal insufficiency - Plan: POCT urinalysis dipstick - no proteinuria, check creat on CMP.  History of prostate cancer, with hematuria on u/a - due for follow up with urology in next 2 months.  Meds ordered this encounter  Medications  . OVER THE COUNTER MEDICATION    Sig: OTC Fish Oil taking everyday  . losartan-hydrochlorothiazide (HYZAAR) 100-12.5 MG per tablet    Sig: Take 1 tablet by mouth daily.    Dispense:  90 tablet    Refill:  1  . diltiazem (TIAZAC) 240 MG 24  hr capsule    Sig: Take 1 capsule (240 mg total) by mouth daily.    Dispense:  90 capsule    Refill:  1   Patient Instructions  You should receive a call or letter about your lab results within the next week to 10 days.   Follow up with your urologist as planned.   Return to the clinic or go to the nearest emergency room if any of your symptoms worsen or new symptoms occur.

## 2014-07-15 NOTE — Patient Instructions (Signed)
You should receive a call or letter about your lab results within the next week to 10 days.   Follow up with your urologist as planned.   Return to the clinic or go to the nearest emergency room if any of your symptoms worsen or new symptoms occur.

## 2014-07-16 LAB — COMPLETE METABOLIC PANEL WITH GFR
ALT: 12 U/L (ref 0–53)
AST: 16 U/L (ref 0–37)
Albumin: 4.2 g/dL (ref 3.5–5.2)
Alkaline Phosphatase: 228 U/L — ABNORMAL HIGH (ref 39–117)
BUN: 16 mg/dL (ref 6–23)
CALCIUM: 9.5 mg/dL (ref 8.4–10.5)
CHLORIDE: 104 meq/L (ref 96–112)
CO2: 28 mEq/L (ref 19–32)
CREATININE: 1.25 mg/dL (ref 0.50–1.35)
GFR, Est African American: 69 mL/min
GFR, Est Non African American: 60 mL/min
Glucose, Bld: 93 mg/dL (ref 70–99)
Potassium: 4.3 mEq/L (ref 3.5–5.3)
Sodium: 139 mEq/L (ref 135–145)
Total Bilirubin: 0.4 mg/dL (ref 0.2–1.2)
Total Protein: 7 g/dL (ref 6.0–8.3)

## 2014-07-19 ENCOUNTER — Other Ambulatory Visit: Payer: Self-pay | Admitting: Physician Assistant

## 2014-07-20 ENCOUNTER — Other Ambulatory Visit: Payer: Self-pay | Admitting: Physician Assistant

## 2014-08-22 DIAGNOSIS — C61 Malignant neoplasm of prostate: Secondary | ICD-10-CM

## 2014-08-25 ENCOUNTER — Other Ambulatory Visit: Payer: Self-pay | Admitting: Family Medicine

## 2014-09-02 ENCOUNTER — Ambulatory Visit: Payer: BC Managed Care – PPO | Admitting: Family Medicine

## 2014-09-13 ENCOUNTER — Other Ambulatory Visit: Payer: Self-pay

## 2014-10-15 ENCOUNTER — Other Ambulatory Visit: Payer: Self-pay | Admitting: Physician Assistant

## 2014-10-30 ENCOUNTER — Other Ambulatory Visit: Payer: Self-pay | Admitting: Family Medicine

## 2014-11-01 ENCOUNTER — Telehealth: Payer: Self-pay

## 2014-11-01 NOTE — Telephone Encounter (Signed)
Pt's pharm called to ask if they could change pt's rx from 30 to 90 tablets. Gave the ok

## 2014-11-07 ENCOUNTER — Telehealth: Payer: Self-pay | Admitting: Family Medicine

## 2014-11-07 NOTE — Telephone Encounter (Signed)
Patient does not take flu shot

## 2014-11-20 ENCOUNTER — Other Ambulatory Visit: Payer: Self-pay | Admitting: Family Medicine

## 2015-01-13 ENCOUNTER — Encounter: Payer: Self-pay | Admitting: Family Medicine

## 2015-01-13 ENCOUNTER — Ambulatory Visit (INDEPENDENT_AMBULATORY_CARE_PROVIDER_SITE_OTHER): Payer: BC Managed Care – PPO | Admitting: Family Medicine

## 2015-01-13 ENCOUNTER — Other Ambulatory Visit: Payer: Self-pay | Admitting: Family Medicine

## 2015-01-13 VITALS — BP 166/81 | HR 63 | Temp 98.1°F | Resp 16 | Ht 72.5 in | Wt 233.6 lb

## 2015-01-13 DIAGNOSIS — R748 Abnormal levels of other serum enzymes: Secondary | ICD-10-CM

## 2015-01-13 DIAGNOSIS — I10 Essential (primary) hypertension: Secondary | ICD-10-CM

## 2015-01-13 DIAGNOSIS — C61 Malignant neoplasm of prostate: Secondary | ICD-10-CM

## 2015-01-13 MED ORDER — DILTIAZEM HCL ER BEADS 240 MG PO CP24: ORAL_CAPSULE | ORAL | Status: DC

## 2015-01-13 MED ORDER — ALFUZOSIN HCL ER 10 MG PO TB24
ORAL_TABLET | ORAL | Status: DC
Start: 2015-01-13 — End: 2015-05-12

## 2015-01-13 MED ORDER — LOSARTAN POTASSIUM-HCTZ 100-12.5 MG PO TABS: 1.0000 | ORAL_TABLET | Freq: Every day | ORAL | Status: DC

## 2015-01-13 NOTE — Progress Notes (Addendum)
Subjective:    Patient ID: Jeffrey Perry, male    DOB: Jun 08, 1946, 69 y.o.   MRN: [id number] This chart was scribed for [name], MD by Marti Sleigh, Medical Scribe. This patient was seen in Room 28 and the patient's care was started a 1:16 PM.  Chief Complaint  Patient presents with  . Hypertension    6 month check up    HPI  HPI Comments: Jeffrey Perry is a 69 y.o. male with a hx of HTN, Prostates cancer, and renal insufficiency who presents to [facility] reporting for a follow up visit. Pt states he has been measuring his BP regularly at home and states his BP was at 132/74 at 8am this morning. Pt also states his BP was 114/62 at one point in the last few weeks, and states he experienced associated dizziness. Pt states up until December, 2015 he was regularly experiencing sx of dizziness, but since that time it has been less frequent. Pt also states after he exercises he has to sit for 20 minutes due to dizziness. Pt also states when he gets up in the middle of the night to use the bathroom he sometimes has to hold the rail in his bathroom for one minute due to dizziness.   Pt also states he experienced a sensation of itching in his bladder last week. Pt states he purchased Cystex to treat his sx last week, and states his sx have since resolved.   Pt states his Alk Phos has been elevated since 1983. Pt states Dr guest suspected this was due to Pagets, and pt states Dr. Elder Cyphers did some testing for pagets, which was all negative.  Pt reports last visit with his urologist was in October of 2015, and last PSA was 0.6.  Notes from Previous HPIs: Pt's last visit was August 17th with Dr. Carlota Raspberry. At that visit HTN was well controlled at 120/80, and pt was continued on same medication at same dosage. In April of 2015 at pt had a creatinine of 1.62, at last visit creatinine was normalized at 1.25. Pt has a hx of prostate cancer, and BPH is seen at [facility] followed by Dr. Janice Norrie. Pt also has a hx of  elevated alk phos, with a level of 228 at last visit, compared to 241 in April 2015.    Patient Active Problem List   Diagnosis Date Noted  . Renal insufficiency 03/05/2013  . HTN (hypertension) 03/06/2012  . Prostate cancer 10/04/2011   Past Medical History  Diagnosis Date  . Hypertension   . Prostate cancer   . Chronic kidney disease    Past Surgical History  Procedure Laterality Date  . Hydrocele excision  2012  . Finger ganglion cyst excision  2011    3rd finger right hand   Allergies  Allergen Reactions  . Lisinopril Cough  . Neuromuscular Blocking Agents Swelling    ankles  . Pyridium [Phenazopyridine Hcl] Swelling    ankles  . Sulfonamide Derivatives Itching   Prior to Admission medications   Medication Sig Start Date End Date Taking? Authorizing Provider  alfuzosin (UROXATRAL) 10 MG 24 hr tablet TAKE 1 TABLET (10 MG TOTAL) BY MOUTH DAILY WITH BREAKFAST. 01/13/15  Yes [name], MD  b complex vitamins tablet Take 1 tablet by mouth daily.     Yes Historical Provider, MD  diltiazem (TIAZAC) 240 MG 24 hr capsule TAKE 1 CAPSULE (240 MG TOTAL) BY MOUTH DAILY. 01/13/15  Yes [name], MD  OVER  THE COUNTER MEDICATION    Yes Historical Provider, MD  OVER THE COUNTER MEDICATION OTC Fish Oil taking everyday   Yes Historical Provider, MD  typhoid (VIVOTIF) DR capsule Take 1 capsule by mouth every other day. 05/10/14  Yes [name], MD  Iron-Vitamin C (IRON 100/C PO) Take by mouth daily.    Historical Provider, MD  losartan-hydrochlorothiazide (HYZAAR) 100-12.5 MG per tablet Take 1 tablet by mouth daily. 01/13/15   [name], MD   History   Social History  . Marital Status: Married    Spouse Name: [name]  . Number of Children: [name]  . Years of Education: [name]   Occupational History  . professor    Social History Main Topics  . Smoking status: Never Smoker   . Smokeless tobacco: Never Used  . Alcohol Use: No  . Drug Use: No  . Sexual Activity:     Partners: Female   Other Topics Concern  . Not on file   Social History Narrative    Review of Systems  Constitutional: Negative for fatigue and unexpected weight change.  Eyes: Negative for visual disturbance.  Respiratory: Negative for cough, chest tightness and shortness of breath.   Cardiovascular: Negative for chest pain, palpitations and leg swelling.  Gastrointestinal: Negative for abdominal pain and blood in stool.  Musculoskeletal: Negative for back pain.  Neurological: Negative for dizziness, light-headedness and headaches.       Objective:   Physical Exam  Constitutional: He is oriented to person, place, and time. He appears well-developed and well-nourished.  HENT:  Head: Normocephalic and atraumatic.  Eyes: EOM are normal. Pupils are equal, round, and reactive to light.  Neck: Neck supple. No JVD present. Carotid bruit is not present.  Cardiovascular: Normal rate, regular rhythm and normal heart sounds.   No murmur heard. Pulmonary/Chest: Effort normal and breath sounds normal. No respiratory distress. He has no wheezes. He has no rales.  Musculoskeletal: Normal range of motion. He exhibits no edema.  No midline bony tenderness over the lumbar or thoracic spine.  Neurological: He is alert and oriented to person, place, and time.  Skin: Skin is warm and dry.  Psychiatric: He has a normal mood and affect. His behavior is normal.  Nursing note and vitals reviewed.   Filed Vitals:   01/13/15 1240  BP: 166/81  Pulse: 63  Temp: 98.1 F (36.7 C)  TempSrc: Oral  Resp: 16  Height: 6' 0.5" (1.842 m)  Weight: 233 lb 9.6 oz (105.96 kg)  SpO2: 97%   Manual BP check on left arm with large cuff performed by Dr. Carlota Raspberry, 154/84.     Assessment & Plan:   Jeffrey Perry is a 69 y.o. male Essential hypertension - Plan: losartan-hydrochlorothiazide (HYZAAR) 100-12.5 MG per tablet, diltiazem (TIAZAC) 240 MG 24 hr capsule, COMPLETE METABOLIC PANEL WITH GFR, Lipid panel,  CANCELED: COMPLETE METABOLIC PANEL WITH GFR, CANCELED: Lipid panel  -elevated in office, home readings better and reported possible symptomatic lows prior. No changes for now, but bring monitor to fasting lab visit tomorrow and if levels runnig low at home - call for possible decrease in doses. No change at current reading. Labs pending - fasting lab visit in next few days.    Elevated alkaline phosphatase level - Plan: Gamma GT, COMPLETE METABOLIC PANEL WITH GFR, CANCELED: COMPLETE METABOLIC PANEL WITH GFR, CANCELED: Gamma GT  - longstanding by his report.  Check GGT, and plan on rheumatology eval, +/- bone scan with hx of prostate  CA.   Prostate cancer - Plan: alfuzosin (UROXATRAL) 10 MG 24 hr tablet  - cont follow up with urologist.   Meds ordered this encounter  Medications  . losartan-hydrochlorothiazide (HYZAAR) 100-12.5 MG per tablet    Sig: Take 1 tablet by mouth daily.    Dispense:  90 tablet    Refill:  1  . alfuzosin (UROXATRAL) 10 MG 24 hr tablet    Sig: TAKE 1 TABLET (10 MG TOTAL) BY MOUTH DAILY WITH BREAKFAST.    Dispense:  90 tablet    Refill:  1  . diltiazem (TIAZAC) 240 MG 24 hr capsule    Sig: TAKE 1 CAPSULE (240 MG TOTAL) BY MOUTH DAILY.    Dispense:  90 capsule    Refill:  1    Authorization is required for next refill.   Patient Instructions  Return tomorrow to [facility] [address] for bloodwork fasting.Bring your blood pressure machine.  Ask For Renay - she can check your blood pressure against your machine. If pressures remain under 120 - call me and we can decrease dose of your medicine.   If the alkaline phosphatase is elevated,  I am checking a GGT - another test to determine if this is from liver. We will likley refer you to a specialist once I receive these results.   Return to the clinic or go to the nearest emergency room if any of your symptoms worsen or new symptoms occur.     I personally performed the services described in this documentation, which was  scribed in my presence. The recorded information has been reviewed and considered, and addended by me as needed.

## 2015-01-13 NOTE — Patient Instructions (Signed)
Return tomorrow to [address] for bloodwork fasting.Bring your blood pressure machine.  Ask For Jeffrey Perry - she can check your blood pressure against your machine. If pressures remain under 120 - call me and we can decrease dose of your medicine.   If the alkaline phosphatase is elevated,  I am checking a GGT - another test to determine if this is from liver. We will likley refer you to a specialist once I receive these results.   Return to the clinic or go to the nearest emergency room if any of your symptoms worsen or new symptoms occur.

## 2015-01-14 ENCOUNTER — Other Ambulatory Visit (INDEPENDENT_AMBULATORY_CARE_PROVIDER_SITE_OTHER): Payer: BC Managed Care – PPO | Admitting: Family Medicine

## 2015-01-14 DIAGNOSIS — I10 Essential (primary) hypertension: Secondary | ICD-10-CM

## 2015-01-14 DIAGNOSIS — R748 Abnormal levels of other serum enzymes: Secondary | ICD-10-CM

## 2015-01-14 LAB — COMPLETE METABOLIC PANEL WITH GFR
ALBUMIN: 4.2 g/dL (ref 3.5–5.2)
ALK PHOS: 267 U/L — AB (ref 39–117)
ALT: 18 U/L (ref 0–53)
AST: 21 U/L (ref 0–37)
BUN: 18 mg/dL (ref 6–23)
CO2: 25 mEq/L (ref 19–32)
Calcium: 9.1 mg/dL (ref 8.4–10.5)
Chloride: 104 mEq/L (ref 96–112)
Creat: 1.23 mg/dL (ref 0.50–1.35)
GFR, Est African American: 70 mL/min
GFR, Est Non African American: 61 mL/min
GLUCOSE: 81 mg/dL (ref 70–99)
POTASSIUM: 4 meq/L (ref 3.5–5.3)
Sodium: 138 mEq/L (ref 135–145)
Total Bilirubin: 0.8 mg/dL (ref 0.2–1.2)
Total Protein: 7 g/dL (ref 6.0–8.3)

## 2015-01-14 LAB — GAMMA GT: GGT: 24 U/L (ref 7–51)

## 2015-01-14 LAB — LIPID PANEL
Cholesterol: 132 mg/dL (ref 0–200)
HDL: 33 mg/dL — ABNORMAL LOW (ref >39)
LDL CALC: 80 mg/dL (ref 0–99)
Total CHOL/HDL Ratio: 4 Ratio
Triglycerides: 93 mg/dL (ref <150)
VLDL: 19 mg/dL (ref 0–40)

## 2015-01-27 ENCOUNTER — Other Ambulatory Visit: Payer: Self-pay | Admitting: Family Medicine

## 2015-01-27 DIAGNOSIS — R748 Abnormal levels of other serum enzymes: Secondary | ICD-10-CM

## 2015-02-19 ENCOUNTER — Other Ambulatory Visit: Payer: Self-pay | Admitting: Family Medicine

## 2015-03-13 ENCOUNTER — Other Ambulatory Visit: Payer: Self-pay | Admitting: Dermatology

## 2015-04-12 ENCOUNTER — Other Ambulatory Visit: Payer: Self-pay | Admitting: Family Medicine

## 2015-04-30 ENCOUNTER — Other Ambulatory Visit: Payer: Self-pay | Admitting: Family Medicine

## 2015-05-12 ENCOUNTER — Ambulatory Visit (INDEPENDENT_AMBULATORY_CARE_PROVIDER_SITE_OTHER): Payer: BC Managed Care – PPO | Admitting: Emergency Medicine

## 2015-05-12 VITALS — BP 118/72 | HR 65 | Temp 98.4°F | Resp 18 | Ht 73.0 in | Wt 233.0 lb

## 2015-05-12 DIAGNOSIS — C61 Malignant neoplasm of prostate: Secondary | ICD-10-CM | POA: Diagnosis not present

## 2015-05-12 DIAGNOSIS — I10 Essential (primary) hypertension: Secondary | ICD-10-CM

## 2015-05-12 MED ORDER — DOXYCYCLINE HYCLATE 100 MG PO TABS: ORAL_TABLET | ORAL | Status: DC

## 2015-05-12 MED ORDER — ALFUZOSIN HCL ER 10 MG PO TB24: ORAL_TABLET | ORAL | Status: DC

## 2015-05-12 MED ORDER — DILTIAZEM HCL ER BEADS 180 MG PO CP24: ORAL_CAPSULE | ORAL | Status: DC

## 2015-05-12 NOTE — Progress Notes (Signed)
Subjective:  This chart was scribed for Nena Jordan, MD by Thea Alken, ED Scribe. This patient was seen in room 13 and the patient's care was started at 1:06 PM.  Patient ID: Jeffrey Perry, male    DOB: 04/13/1946, 69 y.o.   MRN: [id number]  HPI Chief Complaint  Patient presents with  . BP Issues    BP has been up and down for the past few months  . Immunizations   HPI Comments: Jeffrey Perry is a 69 y.o. male who presents to the [facility] for medication refill. Pt reports his BP has been low and has been giving him trouble with walking in the morning. He denies CP and SOB. Pt is planning on visiting his home country [place] in a week for about 2 months. Pt is requesting Doxycycline. Pt is a Pharmacist, hospital.    Past Medical History  Diagnosis Date  . Hypertension   . Prostate cancer   . Chronic kidney disease    Allergies  Allergen Reactions  . Lisinopril Cough  . Neuromuscular Blocking Agents Swelling    ankles  . Pyridium [Phenazopyridine Hcl] Swelling    ankles  . Sulfonamide Derivatives Itching   Prior to Admission medications   Medication Sig Start Date End Date Taking? Authorizing Provider  alfuzosin (UROXATRAL) 10 MG 24 hr tablet TAKE 1 TABLET (10 MG TOTAL) BY MOUTH DAILY WITH BREAKFAST. 01/13/15  Yes [name], MD  b complex vitamins tablet Take 1 tablet by mouth daily.     Yes Historical Provider, MD  diltiazem (TIAZAC) 240 MG 24 hr capsule TAKE 1 CAPSULE (240 MG TOTAL) BY MOUTH DAILY. 01/13/15  Yes [name], MD  Iron-Vitamin C (IRON 100/C PO) Take by mouth daily.   Yes Historical Provider, MD  losartan-hydrochlorothiazide (HYZAAR) 100-12.5 MG per tablet Take 1 tablet by mouth daily. 01/13/15  Yes [name], MD  OVER THE COUNTER MEDICATION    Yes Historical Provider, MD  OVER THE COUNTER MEDICATION OTC Fish Oil taking everyday   Yes Historical Provider, MD  typhoid (VIVOTIF) DR capsule Take 1 capsule by mouth every other day. 05/10/14  Yes  [name], MD   Review of Systems  Respiratory: Negative for shortness of breath.   Cardiovascular: Negative for chest pain and leg swelling.   Objective:   Physical Exam  CONSTITUTIONAL: Well developed/well nourished HEAD: Normocephalic/atraumatic EYES: EOMI/PERRL ENMT: Mucous membranes moist NECK: supple no meningeal signs SPINE/BACK:entire spine nontender CV: S1/S2 noted, no murmurs/rubs/gallops noted LUNGS: Lungs are clear to auscultation bilaterally, no apparent distress ABDOMEN: soft, nontender, no rebound or guarding, bowel sounds noted throughout abdomen GU:no cva tenderness NEURO: Pt is awake/alert/appropriate, moves all extremitiesx4.  No facial droop.   EXTREMITIES: pulses normal/equal, full ROM SKIN: warm, color normal PSYCH: no abnormalities of mood noted, alert and oriented to situation  Filed Vitals:   05/12/15 1245  BP: 118/72  Pulse: 65  Temp: 98.4 F (36.9 C)  TempSrc: Oral  Resp: 18  Height: 6\' 1"  (1.854 m)  Weight: 233 lb (105.688 kg)  SpO2: 98%   Assessment & Plan:  Patient having episodes of being lightheaded in the morning when he goes for a walk. This is not associated with chest pain. I decreased his Cardizem from 240-180. Other medications to remain the same. He is due to visit [place] in about 10 days. He was given doxycycline as his malaria prophylaxis. He is up-to-date on his other vaccines. He declined  a flu shot because of some symptoms he developed after his last flu shot. I personally performed the services described in this documentation, which was scribed in my presence. The recorded information has been reviewed and is accurate.  Nena Jordan, MD

## 2015-05-26 ENCOUNTER — Other Ambulatory Visit: Payer: Self-pay

## 2015-07-10 ENCOUNTER — Other Ambulatory Visit: Payer: Self-pay | Admitting: Family Medicine

## 2015-07-12 ENCOUNTER — Other Ambulatory Visit: Payer: Self-pay | Admitting: Family Medicine

## 2015-07-12 MED ORDER — LOSARTAN POTASSIUM-HCTZ 100-12.5 MG PO TABS: 1.0000 | ORAL_TABLET | Freq: Every day | ORAL | Status: DC

## 2015-07-12 NOTE — Addendum Note (Signed)
Addended by: Kem Boroughs D on: 07/12/2015 10:32 AM   Modules accepted: Orders

## 2015-07-14 ENCOUNTER — Encounter: Payer: Self-pay | Admitting: Family Medicine

## 2015-07-14 ENCOUNTER — Ambulatory Visit (INDEPENDENT_AMBULATORY_CARE_PROVIDER_SITE_OTHER): Payer: BC Managed Care – PPO | Admitting: Family Medicine

## 2015-07-14 VITALS — BP 149/81 | HR 67 | Temp 98.1°F | Resp 16 | Ht 73.0 in | Wt 225.4 lb

## 2015-07-14 DIAGNOSIS — I10 Essential (primary) hypertension: Secondary | ICD-10-CM

## 2015-07-14 DIAGNOSIS — R748 Abnormal levels of other serum enzymes: Secondary | ICD-10-CM

## 2015-07-14 LAB — COMPLETE METABOLIC PANEL WITH GFR
ALT: 17 U/L (ref 9–46)
AST: 20 U/L (ref 10–35)
Albumin: 3.9 g/dL (ref 3.6–5.1)
Alkaline Phosphatase: 204 U/L — ABNORMAL HIGH (ref 40–115)
BUN: 19 mg/dL (ref 7–25)
CO2: 24 mmol/L (ref 20–31)
Calcium: 9.2 mg/dL (ref 8.6–10.3)
Chloride: 105 mmol/L (ref 98–110)
Creat: 1.53 mg/dL — ABNORMAL HIGH (ref 0.70–1.25)
GFR, Est African American: 54 mL/min — ABNORMAL LOW (ref >=60)
GFR, Est Non African American: 46 mL/min — ABNORMAL LOW (ref >=60)
GLUCOSE: 83 mg/dL (ref 65–99)
POTASSIUM: 4.1 mmol/L (ref 3.5–5.3)
SODIUM: 140 mmol/L (ref 135–146)
Total Bilirubin: 0.7 mg/dL (ref 0.2–1.2)
Total Protein: 6.9 g/dL (ref 6.1–8.1)

## 2015-07-14 LAB — LIPID PANEL
CHOL/HDL RATIO: 3.6 ratio (ref <=5.0)
Cholesterol: 130 mg/dL (ref 125–200)
HDL: 36 mg/dL — ABNORMAL LOW (ref >=40)
LDL Cholesterol: 80 mg/dL (ref <130)
Triglycerides: 71 mg/dL (ref <150)
VLDL: 14 mg/dL (ref <30)

## 2015-07-14 NOTE — Progress Notes (Addendum)
Subjective:  This chart was scribed for Jeffrey Ray, MD by Thea Alken, ED Scribe. This patient was seen in room 24 and the patient's care was started at 4:46 PM.   Patient ID: Jeffrey Perry, male    DOB: 14-Oct-1946, 69 y.o.   MRN: [id number]  HPI   Chief Complaint  Patient presents with  . Hypertension   HPI Comments: Jeffrey Perry is a 69 y.o. male who presents to the [facility] complaining of HTN. Pt was told by the pharmacist he needed to be seen for refills. Pt recently returned from [place]. He is on doxycycline for the next 30 days.    Hypertension  Lab Results  Component Value Date   CREATININE 1.23 01/14/2015   He takes diltiazem 180 mg and Hyzaar 100/12.5mg  qd. Pt reports BP is elevated in the 140's in the afternoon. He finds when he is working his BP drops to about 110/60, making him feel a little light headed but this was with diltiazem 280 mg. Pt has been out of town and has not been at work for the past 6 weeks. He denies CP, difficulty breathing, light headedness and dizziness.  Prostate He also has hx of prostate treated by Dr. Janice Norrie. Takes Uroxatral.    Chronic kidney disease.   Lab Results  Component Value Date   CREATININE 1.23 01/14/2015   overall stable at 1.13 at last visit  Elevated alkaline phosphate level Referred to rheumatology. Pt has been seen Dr.Truslow and was started on vitamin D.   Patient Active Problem List   Diagnosis Date Noted  . Renal insufficiency 03/05/2013  . HTN (hypertension) 03/06/2012  . Prostate cancer 10/04/2011   Past Medical History  Diagnosis Date  . Hypertension   . Prostate cancer   . Chronic kidney disease    Past Surgical History  Procedure Laterality Date  . Hydrocele excision  2012  . Finger ganglion cyst excision  2011    3rd finger right hand   Allergies  Allergen Reactions  . Lisinopril Cough  . Neuromuscular Blocking Agents Swelling    ankles  . Pyridium [Phenazopyridine Hcl]  Swelling    ankles  . Sulfonamide Derivatives Itching   Prior to Admission medications   Medication Sig Start Date End Date Taking? Authorizing Provider  alfuzosin (UROXATRAL) 10 MG 24 hr tablet TAKE 1 TABLET (10 MG TOTAL) BY MOUTH DAILY WITH BREAKFAST. 05/12/15  Yes [name], MD  b complex vitamins tablet Take 1 tablet by mouth daily.     Yes Historical Provider, MD  diltiazem (TIAZAC) 180 MG 24 hr capsule TAKE 1 CAPSULE (240 MG TOTAL) BY MOUTH DAILY. 05/12/15  Yes [name], MD  doxycycline (VIBRA-TABS) 100 MG tablet One a day 05/12/15  Yes [name], MD  losartan-hydrochlorothiazide (HYZAAR) 100-12.5 MG per tablet Take 1 tablet by mouth daily. 07/12/15  Yes [name], MD  OVER THE COUNTER MEDICATION    Yes Historical Provider, MD  OVER THE COUNTER MEDICATION OTC Fish Oil taking everyday   Yes Historical Provider, MD  Iron-Vitamin C (IRON 100/C PO) Take by mouth daily.    Historical Provider, MD  typhoid (VIVOTIF) DR capsule Take 1 capsule by mouth every other day. Patient not taking: Reported on 07/14/2015 05/10/14   [name], MD   Social History   Social History  . Marital Status: Married    Spouse Name: [name]  . Number of Children: [name]  . Years of  Education: [name]   Occupational History  . professor    Social History Main Topics  . Smoking status: Never Smoker   . Smokeless tobacco: Never Used  . Alcohol Use: No  . Drug Use: No  . Sexual Activity:    Partners: Female   Other Topics Concern  . Not on file   Social History Narrative   Review of Systems  Constitutional: Negative for fatigue and unexpected weight change.  Eyes: Negative for visual disturbance.  Respiratory: Negative for cough, chest tightness and shortness of breath.   Cardiovascular: Negative for chest pain, palpitations and leg swelling.  Gastrointestinal: Negative for abdominal pain and blood in stool.  Neurological: Negative for dizziness, light-headedness and headaches.     Objective:   Physical Exam  Constitutional: He is oriented to person, place, and time. He appears well-developed and well-nourished.  HENT:  Head: Normocephalic and atraumatic.  Eyes: EOM are normal. Pupils are equal, round, and reactive to light.  Neck: No JVD present. Carotid bruit is not present.  Cardiovascular: Normal rate, regular rhythm and normal heart sounds.   No murmur heard. Pulmonary/Chest: Effort normal and breath sounds normal. He has no rales.  Musculoskeletal: He exhibits no edema.  Neurological: He is alert and oriented to person, place, and time.  Skin: Skin is warm and dry.  Psychiatric: He has a normal mood and affect.  Vitals reviewed.  Filed Vitals:   07/14/15 1551  BP: 149/81  Pulse: 67  Temp: 98.1 F (36.7 C)  TempSrc: Oral  Resp: 16  Height: 6\' 1"  (1.854 m)  Weight: 225 lb 6.4 oz (102.241 kg)  SpO2: 100%    Assessment & Plan:  Jeffrey Perry is a 69 y.o. male Essential hypertension - Plan: COMPLETE METABOLIC PANEL WITH GFR, Lipid panel  -Borderline control on systolic reading, but his not tolerated higher doses of Cardizem. Discussed that based on JNC VIII guidelines, can allow to run up to 150 at this point, but can continue to monitor for any increases. May need to change medicines if remains elevated.  -Labs pending.  Elevated alkaline phosphatase level  -Evaluate by rheumatology and by his report no further evaluation needed.   No orders of the defined types were placed in this encounter.   There are no Patient Instructions on file for this visit.  -Advised to follow-up in 6 months.   I personally performed the services described in this documentation, which was scribed in my presence. The recorded information has been reviewed and considered, and addended by me as needed.

## 2015-07-29 ENCOUNTER — Other Ambulatory Visit: Payer: Self-pay | Admitting: Family Medicine

## 2015-07-29 DIAGNOSIS — R7989 Other specified abnormal findings of blood chemistry: Secondary | ICD-10-CM

## 2015-08-13 ENCOUNTER — Other Ambulatory Visit: Payer: Self-pay | Admitting: Dermatology

## 2015-08-19 ENCOUNTER — Other Ambulatory Visit: Payer: Self-pay | Admitting: Emergency Medicine

## 2015-10-03 ENCOUNTER — Ambulatory Visit (INDEPENDENT_AMBULATORY_CARE_PROVIDER_SITE_OTHER): Payer: BC Managed Care – PPO | Admitting: Internal Medicine

## 2015-10-03 VITALS — BP 110/62 | HR 62 | Temp 97.6°F | Resp 18 | Ht 73.0 in | Wt 228.0 lb

## 2015-10-03 DIAGNOSIS — R319 Hematuria, unspecified: Secondary | ICD-10-CM

## 2015-10-03 LAB — POCT URINALYSIS DIP (MANUAL ENTRY)
Bilirubin, UA: NEGATIVE
Glucose, UA: NEGATIVE
Ketones, POC UA: NEGATIVE
LEUKOCYTES UA: NEGATIVE
NITRITE UA: NEGATIVE
PH UA: 7
PROTEIN UA: NEGATIVE
Spec Grav, UA: 1.01
UROBILINOGEN UA: 0.2

## 2015-10-03 LAB — POC MICROSCOPIC URINALYSIS (UMFC): Mucus: ABSENT

## 2015-10-03 NOTE — Progress Notes (Signed)
Subjective:  This chart was scribed for [name], MD by Thea Alken, ED Scribe. This patient was seen in room 1 and the patient's care was started at 10:23 AM.   Patient ID: Jeffrey Perry, male    DOB: 08-03-1946, 69 y.o.   MRN: [id number]  HPI   Chief Complaint  Patient presents with  . Wants to discuss hematuria    Saw Urologist but wants to discuss with you   HPI Comments: Jeffrey Perry is a 69 y.o. male who presents to the [facility] complaining of hematuria. Pt noticed blood in his urine last weekend. He mentioned this to his urologist 4 days ago and had repaet U/A testing but told him it will clear on its own. Pt states throughout this week he has noticed more blood in his urine, especially urinating with BM. He usually wakes up once during the night to urinate but states last weeks he now will wake up twice during the night. He denies dysuria. He has hx of prostatitis and prostate cancer .    Patient Active Problem List   Diagnosis Date Noted  . Renal insufficiency 03/05/2013  . HTN (hypertension) 03/06/2012  . Prostate cancer ([facility])--2012/Dr Nesi/radiation only 10/04/2011    -  incr alk phos on and off since 1980 without clear dx--told Paget's at one point but Dr Charlestine Night eval said no--unclear etio  Prior to Admission medications   Medication Sig Start Date End Date Taking? Authorizing Provider  alfuzosin (UROXATRAL) 10 MG 24 hr tablet TAKE 1 TABLET (10 MG TOTAL) BY MOUTH DAILY WITH BREAKFAST 08/19/15  Yes Chelle Jeffery, PA-C  b complex vitamins tablet Take 1 tablet by mouth daily.     Yes Historical Provider, MD  diltiazem (TIAZAC) 180 MG 24 hr capsule TAKE 1 CAPSULE (240 MG TOTAL) BY MOUTH DAILY. 05/12/15  Yes [name], MD  Iron-Vitamin C (IRON 100/C PO) Take by mouth daily.   Yes Historical Provider, MD  losartan-hydrochlorothiazide (HYZAAR) 100-12.5 MG per tablet Take 1 tablet by mouth daily. 07/12/15  Yes [name], MD  OVER THE COUNTER  MEDICATION OTC Fish Oil taking everyday   Yes Historical Provider, MD  [place] Provider, MD   Review of Systems  Gastrointestinal: Negative for nausea and vomiting.  Genitourinary: Positive for frequency and hematuria. Negative for dysuria.   Objective:   Physical Exam  Filed Vitals:   10/03/15 0920  BP: 110/62  Pulse: 62  Temp: 97.6 F (36.4 C)  TempSrc: Oral  Resp: 18  Height: 6' 1"  (1.854 m)  Weight: 228 lb (103.42 kg)  SpO2: 99%   Results for orders placed or performed in visit on 10/03/15  POCT urinalysis dipstick  Result Value Ref Range   Color, UA yellow yellow   Clarity, UA clear clear   Glucose, UA negative negative   Bilirubin, UA negative negative   Ketones, POC UA negative negative   Spec Grav, UA 1.010    Blood, UA moderate (A) negative   pH, UA 7.0    Protein Ur, POC negative negative   Urobilinogen, UA 0.2    Nitrite, UA Negative Negative   Leukocytes, UA Negative Negative  POCT Microscopic Urinalysis ([facility])  Result Value Ref Range   WBC,UR,HPF,POC None None WBC/hpf   RBC,UR,HPF,POC Moderate (A) None RBC/hpf   Bacteria None None, Too numerous to count   Mucus Absent Absent   Epithelial Cells, UR Per Microscopy None  None, Too numerous to count cells/hpf   Assessment & Plan:    1. Hematuria -painless macro and micro with mild renal insufficiency(stable-long term)   He is very concerned and I think based on today's exam needs r/o cancer. Will scan first and if no answer will snd back to urol to consider urine cytology and cystoscopy since it is unlikely to be glomerular in origin   I have completed the patient encounter in its entirety as documented by the scribe, with editing by me where necessary. Mylez Venable P. Laney Pastor, M.D.

## 2015-10-10 ENCOUNTER — Ambulatory Visit
Admission: RE | Admit: 2015-10-10 | Discharge: 2015-10-10 | Disposition: A | Discharge status: Home / Self Care | Payer: BC Managed Care – PPO | Source: Ambulatory Visit | Attending: Internal Medicine | Admitting: Internal Medicine

## 2015-10-10 DIAGNOSIS — R319 Hematuria, unspecified: Secondary | ICD-10-CM

## 2015-10-10 MED ORDER — IOPAMIDOL (ISOVUE-300) INJECTION 61%: 75.0000 mL | Freq: Once | INTRAVENOUS | Status: DC | PRN

## 2015-10-17 ENCOUNTER — Ambulatory Visit (INDEPENDENT_AMBULATORY_CARE_PROVIDER_SITE_OTHER): Payer: BC Managed Care – PPO | Admitting: Internal Medicine

## 2015-10-17 ENCOUNTER — Telehealth: Payer: Self-pay

## 2015-10-17 VITALS — BP 118/84 | HR 57 | Temp 98.2°F | Resp 16 | Ht 73.0 in | Wt 228.4 lb

## 2015-10-17 DIAGNOSIS — Z8546 Personal history of malignant neoplasm of prostate: Secondary | ICD-10-CM | POA: Diagnosis not present

## 2015-10-17 DIAGNOSIS — R748 Abnormal levels of other serum enzymes: Secondary | ICD-10-CM

## 2015-10-17 DIAGNOSIS — R935 Abnormal findings on diagnostic imaging of other abdominal regions, including retroperitoneum: Secondary | ICD-10-CM | POA: Diagnosis not present

## 2015-10-17 DIAGNOSIS — R319 Hematuria, unspecified: Secondary | ICD-10-CM | POA: Diagnosis not present

## 2015-10-17 DIAGNOSIS — E559 Vitamin D deficiency, unspecified: Secondary | ICD-10-CM | POA: Diagnosis not present

## 2015-10-17 LAB — POC MICROSCOPIC URINALYSIS (UMFC): Mucus: ABSENT

## 2015-10-17 LAB — POCT URINALYSIS DIP (MANUAL ENTRY)
Bilirubin, UA: NEGATIVE
Glucose, UA: NEGATIVE
Ketones, POC UA: NEGATIVE
Leukocytes, UA: NEGATIVE
NITRITE UA: NEGATIVE
PH UA: 6.5
Protein Ur, POC: NEGATIVE
RBC UA: NEGATIVE
Spec Grav, UA: 1.01
UROBILINOGEN UA: 0.2

## 2015-10-17 NOTE — Progress Notes (Signed)
Subjective:  This chart was scribed for Jeffrey Lin, MD by Thea Alken, ED Scribe. This patient was seen in room 14 and the patient's care was started at 10:15 AM.  Patient ID: Jeffrey Perry, male    DOB: 01-Sep-1946, 69 y.o.   MRN: [id number]  HPI   Chief Complaint  Patient presents with  . Follow-up    to discuss his lab results   HPI Comments: Garison Perry is a 69 y.o. male who presents to the [facility] for a follow up.   See rheumatology evaluation 01/2015 which showed a increased alka phos and slight decreased vitamin D. Did not suspect Paget's disease. Suggesting the need for PTH.   States Dr. Janice Norrie retired about to 2 months ago. Instead he was seen Dr. Avanell Shackleton who did not feel the need to do any further labs or testing with regard to hematuria or pros Ca--but we saw him for gross hematuria 11/4. None this week. Asympto  Pt reports he was told he had cardiomegaly. States he walks about 5 times a week, but does not have SOB with this. States his nephew is currently waiting for a heart transplant for cardiomyopathy. He has not seen cardiology in several years.  Recent CT to eval hematuria 1. Abnormal sacrum and bilateral iliac bones with progression since 2005. Both treated bony metastatic disease and chronic progressive Pagets disease could have this appearance. However, active bone mets are not excluded. Follow-up whole-body nuclear medicine bone scan would be valuable and could be compared to that on 02/27/2010.  Past Medical History  Diagnosis Date  . Hypertension   . Prostate cancer ([place])   . Chronic kidney disease    Allergies  Allergen Reactions  . Lisinopril Cough  . Neuromuscular Blocking Agents Swelling    ankles  . Pyridium [Phenazopyridine Hcl] Swelling    ankles  . Sulfonamide Derivatives Itching   Prior to Admission medications   Medication Sig Start Date End Date Taking? Authorizing Provider  alfuzosin (UROXATRAL) 10 MG 24 hr tablet  TAKE 1 TABLET (10 MG TOTAL) BY MOUTH DAILY WITH BREAKFAST 08/19/15  Yes Chelle Jeffery, PA-C  diltiazem (TIAZAC) 180 MG 24 hr capsule TAKE 1 CAPSULE (240 MG TOTAL) BY MOUTH DAILY. 05/12/15  Yes [name], MD  losartan-hydrochlorothiazide (HYZAAR) 100-12.5 MG per tablet Take 1 tablet by mouth daily. 07/12/15  Yes [name], MD  OVER THE COUNTER MEDICATION OTC Fish Oil taking everyday   Yes Historical Provider, MD  b complex vitamins tablet Take 1 tablet by mouth daily.      Historical Provider, MD  doxycycline (VIBRA-TABS) 100 MG tablet One a day Patient not taking: Reported on 10/03/2015 05/12/15   [name], MD  Iron-Vitamin C (IRON 100/C PO) Take by mouth daily.    Historical Provider, MD  [place]     Historical Provider, MD      Review of Systems  Constitutional: Negative for fever, appetite change and unexpected weight change.  Musculoskeletal: Negative for back pain, arthralgias and gait problem.       Objective:   Physical Exam  Constitutional: He is oriented to person, place, and time. He appears well-developed and well-nourished. No distress.  HENT:  Head: Normocephalic and atraumatic.  Eyes: Pupils are equal, round, and reactive to light.  Neck: Normal range of motion.  Cardiovascular: Normal rate.   Pulmonary/Chest: Effort normal. No respiratory distress.  Musculoskeletal: Normal range of motion.  Neurological: He is  alert and oriented to person, place, and time.  Skin: Skin is warm and dry.  Psychiatric: He has a normal mood and affect. His behavior is normal.  Nursing note and vitals reviewed.   Filed Vitals:   10/17/15 0930  BP: 118/84  Pulse: 57  Temp: 98.2 F (36.8 C)  TempSrc: Oral  Resp: 16  Height: 6\' 1"  (1.854 m)  Weight: 228 lb 6.4 oz (103.602 kg)  SpO2: 98%   Results for orders placed or performed in visit on 10/17/15  POCT urinalysis dipstick  Result Value Ref Range   Color, UA yellow yellow   Clarity, UA clear  clear   Glucose, UA negative negative   Bilirubin, UA negative negative   Ketones, POC UA negative negative   Spec Grav, UA 1.010    Blood, UA negative negative   pH, UA 6.5    Protein Ur, POC negative negative   Urobilinogen, UA 0.2    Nitrite, UA Negative Negative   Leukocytes, UA Negative Negative  POCT Microscopic Urinalysis ([facility])  Result Value Ref Range   WBC,UR,HPF,POC None None WBC/hpf   RBC,UR,HPF,POC None None RBC/hpf   Bacteria None None, Too numerous to count   Mucus Absent Absent   Epithelial Cells, UR Per Microscopy None None, Too numerous to count cells/hpf   Assessment & Plan:    1. Vitamin D deficiency   2. Elevated alkaline phosphatase level   3. Hematuria --with today = no RBCs we will follow U/A at upcoming visits If another episode of gross hematuria will ref for cystoscopy Dr Louis Meckel  4. Abnormal CT scan, pelvis   5. H/O prostate cancer     Orders Placed This Encounter  Procedures  . NM Bone Scan Whole Body    Standing Status: Future     Number of Occurrences:      Standing Expiration Date: 12/16/2016    Order Specific Question:  Reason for Exam (SYMPTOM  OR DIAGNOSIS REQUIRED)    Answer:  abnormal Ct-pelvic bones    Order Specific Question:  Preferred imaging location?    Answer:  [facility]    Order Specific Question:  If indicated for the ordered procedure, I authorize the administration of a radiopharmaceutical per Radiology protocol    Answer:  Yes  . Parathyroid hormone, intact (no Ca)  . Alkaline Phosphatase Isoenzymes  . POCT urinalysis dipstick  . POCT Microscopic Urinalysis ([facility])    By signing my name below, I, Raven Small, attest that this documentation has been prepared under the direction and in the presence of Jeffrey Lin, MD.  Electronically Signed: Thea Alken, ED Scribe. 10/17/2015. 10:30 AM. I have completed the patient encounter in its entirety as documented by the scribe, with editing by me where  necessary. Ariez Neilan P. Laney Pastor, M.D.

## 2015-10-17 NOTE — Telephone Encounter (Signed)
Left message for patient regarding his appointment for his whole body bone scan.  The appointment is on November 29th at [facility].  There is no prep required.  If he has any questions regarding the scan or needs to reschedule, he can reach the hospital's radiology department at [phone].

## 2015-10-20 LAB — PARATHYROID HORMONE, INTACT (NO CA): PTH: 65 pg/mL — AB (ref 14–64)

## 2015-10-21 LAB — ALKALINE PHOSPHATASE ISOENZYMES
Alkaline Phonsphatase: 234 U/L — ABNORMAL HIGH (ref 40–115)
Bone Isoenzymes: 85 % — ABNORMAL HIGH (ref 28–66)
Intestinal Isoenzymes: 5 % (ref 1–24)
LIVER ISOENZYMES (ALP ISO): 10 % — AB (ref 25–69)
MACROHEPATIC ISOENZYMES: 0 %

## 2015-10-28 ENCOUNTER — Encounter (HOSPITAL_COMMUNITY)
Admission: RE | Admit: 2015-10-28 | Discharge: 2015-10-28 | Disposition: A | Discharge status: Home / Self Care | Payer: BC Managed Care – PPO | Source: Ambulatory Visit | Attending: Internal Medicine | Admitting: Internal Medicine

## 2015-10-28 DIAGNOSIS — Z8546 Personal history of malignant neoplasm of prostate: Secondary | ICD-10-CM | POA: Diagnosis present

## 2015-10-28 DIAGNOSIS — R935 Abnormal findings on diagnostic imaging of other abdominal regions, including retroperitoneum: Secondary | ICD-10-CM | POA: Diagnosis present

## 2015-10-28 DIAGNOSIS — E559 Vitamin D deficiency, unspecified: Secondary | ICD-10-CM | POA: Diagnosis present

## 2015-10-28 DIAGNOSIS — R748 Abnormal levels of other serum enzymes: Secondary | ICD-10-CM | POA: Insufficient documentation

## 2015-10-28 MED ORDER — TECHNETIUM TC 99M MEDRONATE IV KIT: 25.0000 | PACK | Freq: Once | INTRAVENOUS | Status: DC | PRN

## 2016-01-02 ENCOUNTER — Other Ambulatory Visit: Payer: Self-pay | Admitting: Family Medicine

## 2016-01-19 ENCOUNTER — Ambulatory Visit: Payer: Self-pay | Admitting: Family Medicine

## 2016-01-21 ENCOUNTER — Ambulatory Visit (INDEPENDENT_AMBULATORY_CARE_PROVIDER_SITE_OTHER): Payer: BC Managed Care – PPO | Admitting: Family Medicine

## 2016-01-21 ENCOUNTER — Encounter: Payer: Self-pay | Admitting: Family Medicine

## 2016-01-21 VITALS — BP 130/76 | HR 56 | Temp 98.2°F | Resp 16 | Ht 71.75 in | Wt 225.4 lb

## 2016-01-21 DIAGNOSIS — R748 Abnormal levels of other serum enzymes: Secondary | ICD-10-CM

## 2016-01-21 DIAGNOSIS — G4700 Insomnia, unspecified: Secondary | ICD-10-CM

## 2016-01-21 DIAGNOSIS — R319 Hematuria, unspecified: Secondary | ICD-10-CM | POA: Diagnosis not present

## 2016-01-21 DIAGNOSIS — I10 Essential (primary) hypertension: Secondary | ICD-10-CM

## 2016-01-21 DIAGNOSIS — Z87898 Personal history of other specified conditions: Secondary | ICD-10-CM | POA: Diagnosis not present

## 2016-01-21 DIAGNOSIS — M889 Osteitis deformans of unspecified bone: Secondary | ICD-10-CM

## 2016-01-21 DIAGNOSIS — R7989 Other specified abnormal findings of blood chemistry: Secondary | ICD-10-CM

## 2016-01-21 LAB — POCT URINALYSIS DIP (MANUAL ENTRY)
Bilirubin, UA: NEGATIVE
Blood, UA: NEGATIVE
GLUCOSE UA: NEGATIVE
Ketones, POC UA: NEGATIVE
LEUKOCYTES UA: NEGATIVE
NITRITE UA: NEGATIVE
PROTEIN UA: NEGATIVE
SPEC GRAV UA: 1.01
UROBILINOGEN UA: 0.2
pH, UA: 6.5

## 2016-01-21 LAB — CBC
HEMATOCRIT: 39.7 % (ref 39.0–52.0)
HEMOGLOBIN: 12.9 g/dL — AB (ref 13.0–17.0)
MCH: 22.5 pg — ABNORMAL LOW (ref 26.0–34.0)
MCHC: 32.5 g/dL (ref 30.0–36.0)
MCV: 69.3 fL — ABNORMAL LOW (ref 78.0–100.0)
MPV: 9.6 fL (ref 8.6–12.4)
Platelets: 153 10*3/uL (ref 150–400)
RBC: 5.73 MIL/uL (ref 4.22–5.81)
RDW: 16 % — ABNORMAL HIGH (ref 11.5–15.5)
WBC: 4 10*3/uL (ref 4.0–10.5)

## 2016-01-21 LAB — POC MICROSCOPIC URINALYSIS (UMFC): MUCUS RE: ABSENT

## 2016-01-21 LAB — BASIC METABOLIC PANEL
BUN: 18 mg/dL (ref 7–25)
CALCIUM: 8.9 mg/dL (ref 8.6–10.3)
CO2: 23 mmol/L (ref 20–31)
Chloride: 102 mmol/L (ref 98–110)
Creat: 1.37 mg/dL — ABNORMAL HIGH (ref 0.70–1.25)
Glucose, Bld: 95 mg/dL (ref 65–99)
POTASSIUM: 3.9 mmol/L (ref 3.5–5.3)
SODIUM: 136 mmol/L (ref 135–146)

## 2016-01-21 MED ORDER — DILTIAZEM HCL ER BEADS 180 MG PO CP24: ORAL_CAPSULE | ORAL | Status: DC

## 2016-01-21 MED ORDER — LOSARTAN POTASSIUM-HCTZ 100-12.5 MG PO TABS: 1.0000 | ORAL_TABLET | Freq: Every day | ORAL | Status: DC

## 2016-01-21 MED ORDER — ALFUZOSIN HCL ER 10 MG PO TB24: ORAL_TABLET | ORAL | Status: DC

## 2016-01-21 NOTE — Patient Instructions (Addendum)
See information on insomnia below. Avoid large meals near bedtime, and try to decrease fluid intake close to bedtime. If still having persistent problems with sleep in next 6 weeks - return to discuss further.   I will refer you back to rheumatologist to discuss the bone scan results and possible Pagets disease, but as no pain in areas, no new treatments needed at this time.   Discuss night sweats with your urologist and rheumatologist, but less likely concerning as only 1-2 times per month. If these symptoms become more frequent, fever, or associated chest pain or shortness of breath - return to discuss sooner.   You should receive a call or letter about your lab results within the next week to 10 days.   Keep appointment with urology in April. I do not see any blood in urine today.    Insomnia Insomnia is a sleep disorder that makes it difficult to fall asleep or to stay asleep. Insomnia can cause tiredness (fatigue), low energy, difficulty concentrating, mood swings, and poor performance at work or school.  There are three different ways to classify insomnia:  Difficulty falling asleep.  Difficulty staying asleep.  Waking up too early in the morning. Any type of insomnia can be long-term (chronic) or short-term (acute). Both are common. Short-term insomnia usually lasts for three months or less. Chronic insomnia occurs at least three times a week for longer than three months. CAUSES  Insomnia may be caused by another condition, situation, or substance, such as:  Anxiety.  Certain medicines.  Gastroesophageal reflux disease (GERD) or other gastrointestinal conditions.  Asthma or other breathing conditions.  Restless legs syndrome, sleep apnea, or other sleep disorders.  Chronic pain.  Menopause. This may include hot flashes.  Stroke.  Abuse of alcohol, tobacco, or illegal drugs.  Depression.  Caffeine.   Neurological disorders, such as Alzheimer disease.  An  overactive thyroid (hyperthyroidism). The cause of insomnia may not be known. RISK FACTORS Risk factors for insomnia include:  Gender. Women are more commonly affected than men.  Age. Insomnia is more common as you get older.  Stress. This may involve your professional or personal life.  Income. Insomnia is more common in people with lower income.  Lack of exercise.   Irregular work schedule or night shifts.  Traveling between different time zones. SIGNS AND SYMPTOMS If you have insomnia, trouble falling asleep or trouble staying asleep is the main symptom. This may lead to other symptoms, such as:  Feeling fatigued.  Feeling nervous about going to sleep.  Not feeling rested in the morning.  Having trouble concentrating.  Feeling irritable, anxious, or depressed. TREATMENT  Treatment for insomnia depends on the cause. If your insomnia is caused by an underlying condition, treatment will focus on addressing the condition. Treatment may also include:   Medicines to help you sleep.  Counseling or therapy.  Lifestyle adjustments. HOME CARE INSTRUCTIONS   Take medicines only as directed by your health care provider.  Keep regular sleeping and waking hours. Avoid naps.  Keep a sleep diary to help you and your health care provider figure out what could be causing your insomnia. Include:   When you sleep.  When you wake up during the night.  How well you sleep.   How rested you feel the next day.  Any side effects of medicines you are taking.  What you eat and drink.   Make your bedroom a comfortable place where it is easy to fall asleep:  Put  up shades or special blackout curtains to block light from outside.  Use a white noise machine to block noise.  Keep the temperature cool.   Exercise regularly as directed by your health care provider. Avoid exercising right before bedtime.  Use relaxation techniques to manage stress. Ask your health care  provider to suggest some techniques that may work well for you. These may include:  Breathing exercises.  Routines to release muscle tension.  Visualizing peaceful scenes.  Cut back on alcohol, caffeinated beverages, and cigarettes, especially close to bedtime. These can disrupt your sleep.  Do not overeat or eat spicy foods right before bedtime. This can lead to digestive discomfort that can make it hard for you to sleep.  Limit screen use before bedtime. This includes:  Watching TV.  Using your smartphone, tablet, and computer.  Stick to a routine. This can help you fall asleep faster. Try to do a quiet activity, brush your teeth, and go to bed at the same time each night.  Get out of bed if you are still awake after 15 minutes of trying to sleep. Keep the lights down, but try reading or doing a quiet activity. When you feel sleepy, go back to bed.  Make sure that you drive carefully. Avoid driving if you feel very sleepy.  Keep all follow-up appointments as directed by your health care provider. This is important. SEEK MEDICAL CARE IF:   You are tired throughout the day or have trouble in your daily routine due to sleepiness.  You continue to have sleep problems or your sleep problems get worse. SEEK IMMEDIATE MEDICAL CARE IF:   You have serious thoughts about hurting yourself or someone else.   This information is not intended to replace advice given to you by your health care provider. Make sure you discuss any questions you have with your health care provider.   Document Released: 11/12/2000 Document Revised: 08/06/2015 Document Reviewed: 08/16/2014 Elsevier Interactive Patient Education [employer].

## 2016-01-21 NOTE — Progress Notes (Addendum)
Subjective:    Patient ID: Jeffrey Perry, male    DOB: [birth year], 70 y.o.   MRN: [id number] By signing my name below, I, Zola Button, attest that this documentation has been prepared under the direction and in the presence of [name], MD.  Electronically Signed: Zola Button, Medical Scribe. 01/21/2016. 8:13 AM.  HPI HPI Comments: Jeffrey Perry is a 70 y.o. male with a history of HTN, prostate cancer and renal insufficiency who presents to the [facility] for a follow-up.   Hypertension: Last office visit with me was in August 2016 but did see Dr. Laney Pastor on November 18th. He is taking Hyzaar once per day and diltiazem once a day. He has not had the creatinine rechecked although a lab order was placed for him to return. Patient has been checking his blood pressure at home and notes they have been fine. He denies lightheadedness, dizziness, and blood in stool. Lab Results  Component Value Date   CREATININE 1.53* 07/14/2015    Lab Results  Component Value Date   CHOL 130 07/14/2015   HDL 36* 07/14/2015   LDLCALC 80 07/14/2015   TRIG 71 07/14/2015   CHOLHDL 3.6 07/14/2015    Elevated Alkaline Phosphatase: Seen by rheumatology. Did have a borderline elevated PTH in November with elevated bone and liver alk-phos. He had a CT scan in his abdomen on November 11th with abnormal sacrum and iliac bones. Recommended bone scan. He has a history of prostate cancer, followed by Dr. Louis Meckel, [facility]. The bone scan did show signs of Paget's disease. Bone scan on 10/28/15 showed: stable appearance of increased uptake around the sacrum; no findings to suggest metastatic disease; possible chronic Paget's disease. Patient was told not to be too concerned.  Hematuria: See prior visits. Plan for repeat today. He has not noticed any hematuria. He has an appointment with urology in April.  Difficulty sleeping: He has had difficulty sleeping that started over a year ago. He  typically urinates once a night around 3:30 AM, then has difficulty falling back asleep. Patient notes this happens more often when eating late meals at night. He does drink water within an hour of sleeping. Patient denies waking up SOB and is unsure if he snores. He states he had a sleep study done about 5-6 years ago which was negative. He does not drink caffeine.  Night sweats: Patient also reports waking up with night sweats and feeling hot about twice a month, for the past few years. He denies fever, chest pain and SOB.  Patient Active Problem List   Diagnosis Date Noted  . Renal insufficiency 03/05/2013  . HTN (hypertension) 03/06/2012  . Prostate cancer ([place]) 10/04/2011   Past Medical History  Diagnosis Date  . Hypertension   . Prostate cancer ([place])   . Chronic kidney disease    Past Surgical History  Procedure Laterality Date  . Hydrocele excision  2012  . Finger ganglion cyst excision  2011    3rd finger right hand   Allergies  Allergen Reactions  . Lisinopril Cough  . Neuromuscular Blocking Agents Swelling    ankles  . Pyridium [Phenazopyridine Hcl] Swelling    ankles  . Sulfonamide Derivatives Itching   Prior to Admission medications   Medication Sig Start Date End Date Taking? Authorizing Provider  alfuzosin (UROXATRAL) 10 MG 24 hr tablet TAKE 1 TABLET (10 MG TOTAL) BY MOUTH DAILY WITH BREAKFAST 08/19/15   Chelle Jeffery, PA-C  b complex vitamins tablet  Take 1 tablet by mouth daily.      Historical Provider, MD  diltiazem (TIAZAC) 180 MG 24 hr capsule TAKE 1 CAPSULE (240 MG TOTAL) BY MOUTH DAILY. 05/12/15   [name], MD  doxycycline (VIBRA-TABS) 100 MG tablet One a day Patient not taking: Reported on 10/03/2015 05/12/15   [name], MD  Iron-Vitamin C (IRON 100/C PO) Take by mouth daily.    Historical Provider, MD  losartan-hydrochlorothiazide (HYZAAR) 100-12.5 MG tablet TAKE 1 TABLET BY MOUTH DAILY. 01/03/16   [name], MD  OVER THE COUNTER MEDICATION      Historical Provider, MD  OVER THE COUNTER MEDICATION OTC Fish Oil taking everyday    Historical Provider, MD   Social History   Social History  . Marital Status: Married    Spouse Name: [name]  . Number of Children: [name]  . Years of Education: [name]   Occupational History  . professor    Social History Main Topics  . Smoking status: Never Smoker   . Smokeless tobacco: Never Used  . Alcohol Use: No  . Drug Use: No  . Sexual Activity:    Partners: Female   Other Topics Concern  . Not on file   Social History Narrative     Review of Systems  Constitutional: Positive for diaphoresis. Negative for fever, fatigue and unexpected weight change.  Eyes: Negative for visual disturbance.  Respiratory: Negative for cough, chest tightness and shortness of breath.   Cardiovascular: Negative for chest pain, palpitations and leg swelling.  Gastrointestinal: Negative for abdominal pain and blood in stool.  Genitourinary: Negative for hematuria.  Neurological: Negative for dizziness, light-headedness and headaches.  Psychiatric/Behavioral: Positive for sleep disturbance.       Objective:   Physical Exam  Constitutional: He is oriented to person, place, and time. He appears well-developed and well-nourished.  HENT:  Head: Normocephalic and atraumatic.  Eyes: EOM are normal. Pupils are equal, round, and reactive to light.  Neck: No JVD present. Carotid bruit is not present.  Cardiovascular: Normal rate, regular rhythm and normal heart sounds.   No murmur heard. Pulmonary/Chest: Effort normal and breath sounds normal. He has no rales.  Musculoskeletal: He exhibits no edema.  Neurological: He is alert and oriented to person, place, and time.  Skin: Skin is warm and dry.  Psychiatric: He has a normal mood and affect.  Vitals reviewed.   Filed Vitals:   01/21/16 0811  BP: 130/76  Pulse: 56  Temp: 98.2 F (36.8 C)  TempSrc: Oral  Resp: 16  Height: 5' 11.75" (1.822 m)  Weight: 225  lb 6.4 oz (102.241 kg)  SpO2: 98%    Results for orders placed or performed in visit on 01/21/16  POCT urinalysis dipstick  Result Value Ref Range   Color, UA yellow yellow   Clarity, UA clear clear   Glucose, UA negative negative   Bilirubin, UA negative negative   Ketones, POC UA negative negative   Spec Grav, UA 1.010    Blood, UA negative negative   pH, UA 6.5    Protein Ur, POC negative negative   Urobilinogen, UA 0.2    Nitrite, UA Negative Negative   Leukocytes, UA Negative Negative  POCT Microscopic Urinalysis ([facility])  Result Value Ref Range   WBC,UR,HPF,POC None None WBC/hpf   RBC,UR,HPF,POC None None RBC/hpf   Bacteria None None, Too numerous to count   Mucus Absent Absent   Epithelial Cells, UR Per Microscopy None None, Too numerous  to count cells/hpf        Assessment & Plan:   Jeffrey Perry is a 70 y.o. male Elevated serum creatinine - Plan: Basic metabolic panel Essential hypertension - Plan: alfuzosin (UROXATRAL) 10 MG 24 hr tablet, diltiazem (TIAZAC) 180 MG 24 hr capsule, losartan-hydrochlorothiazide (HYZAAR) 100-12.5 MG tablet  -stable. No change in medications. History of elevated creatinine, Possible chronic kidney diseaseBMP pending. Try to avoid nephrotoxins such as NSAIDs, and maintain hydration.  Elevated alkaline phosphatase level - Plan: Ambulatory referral to Rheumatology Paget's bone disease - Plan: Ambulatory referral to Rheumatology  - Possible Paget's based on bone scan. He is asymptomatic in regards to pain, but will have dermatology follow-up to discuss if other monitoring or treatment indicated.   History of night sweats - Plan: CBC  -Infrequent.   Can discuss with urology and rheumatology, but no concerning symptoms other than infrequent night sweats. CBC pending.  Hematuria - Plan: POCT urinalysis dipstick, POCT Microscopic Urinalysis ([facility]), CANCELED: PSA, Medicare  -Follow-up with urology as planned. No hematuria noted on office urinalysis.  Did draw a PSA for his upcoming appointment with urology to have available at the visit per his request.   Insomnia -   -Sleep hygiene reviewed. May be a component of drinking fluids to late in the evening then remains awake once he does wake up at night to urinate.  Can try to decrease fluid intake just before bedtime, but maintain hydration throughout the day. Handout given on insomnia, RTC precautions if persists or worsens.  Meds ordered this encounter  Medications  . alfuzosin (UROXATRAL) 10 MG 24 hr tablet    Sig: TAKE 1 TABLET (10 MG TOTAL) BY MOUTH DAILY WITH BREAKFAST    Dispense:  90 tablet    Refill:  1  . diltiazem (TIAZAC) 180 MG 24 hr capsule    Sig: TAKE 1 CAPSULE (240 MG TOTAL) BY MOUTH DAILY.    Dispense:  90 capsule    Refill:  1  . losartan-hydrochlorothiazide (HYZAAR) 100-12.5 MG tablet    Sig: Take 1 tablet by mouth daily.    Dispense:  90 tablet    Refill:  1   Patient Instructions  See information on insomnia below. Avoid large meals near bedtime, and try to decrease fluid intake close to bedtime. If still having persistent problems with sleep in next 6 weeks - return to discuss further.   I will refer you back to rheumatologist to discuss the bone scan results and possible Pagets disease, but as no pain in areas, no new treatments needed at this time.   Discuss night sweats with your urologist and rheumatologist, but less likely concerning as only 1-2 times per month. If these symptoms become more frequent, fever, or associated chest pain or shortness of breath - return to discuss sooner.   You should receive a call or letter about your lab results within the next week to 10 days.   Keep appointment with urology in April. I do not see any blood in urine today.    Insomnia Insomnia is a sleep disorder that makes it difficult to fall asleep or to stay asleep. Insomnia can cause tiredness (fatigue), low energy, difficulty concentrating, mood swings, and poor  performance at work or school.  There are three different ways to classify insomnia:  Difficulty falling asleep.  Difficulty staying asleep.  Waking up too early in the morning. Any type of insomnia can be long-term (chronic) or short-term (acute). Both are common. Short-term insomnia usually  lasts for three months or less. Chronic insomnia occurs at least three times a week for longer than three months. CAUSES  Insomnia may be caused by another condition, situation, or substance, such as:  Anxiety.  Certain medicines.  Gastroesophageal reflux disease (GERD) or other gastrointestinal conditions.  Asthma or other breathing conditions.  Restless legs syndrome, sleep apnea, or other sleep disorders.  Chronic pain.  Menopause. This may include hot flashes.  Stroke.  Abuse of alcohol, tobacco, or illegal drugs.  Depression.  Caffeine.   Neurological disorders, such as Alzheimer disease.  An overactive thyroid (hyperthyroidism). The cause of insomnia may not be known. RISK FACTORS Risk factors for insomnia include:  Gender. Women are more commonly affected than men.  Age. Insomnia is more common as you get older.  Stress. This may involve your professional or personal life.  Income. Insomnia is more common in people with lower income.  Lack of exercise.   Irregular work schedule or night shifts.  Traveling between different time zones. SIGNS AND SYMPTOMS If you have insomnia, trouble falling asleep or trouble staying asleep is the main symptom. This may lead to other symptoms, such as:  Feeling fatigued.  Feeling nervous about going to sleep.  Not feeling rested in the morning.  Having trouble concentrating.  Feeling irritable, anxious, or depressed. TREATMENT  Treatment for insomnia depends on the cause. If your insomnia is caused by an underlying condition, treatment will focus on addressing the condition. Treatment may also include:   Medicines to  help you sleep.  Counseling or therapy.  Lifestyle adjustments. HOME CARE INSTRUCTIONS   Take medicines only as directed by your health care provider.  Keep regular sleeping and waking hours. Avoid naps.  Keep a sleep diary to help you and your health care provider figure out what could be causing your insomnia. Include:   When you sleep.  When you wake up during the night.  How well you sleep.   How rested you feel the next day.  Any side effects of medicines you are taking.  What you eat and drink.   Make your bedroom a comfortable place where it is easy to fall asleep:  Put up shades or special blackout curtains to block light from outside.  Use a white noise machine to block noise.  Keep the temperature cool.   Exercise regularly as directed by your health care provider. Avoid exercising right before bedtime.  Use relaxation techniques to manage stress. Ask your health care provider to suggest some techniques that may work well for you. These may include:  Breathing exercises.  Routines to release muscle tension.  Visualizing peaceful scenes.  Cut back on alcohol, caffeinated beverages, and cigarettes, especially close to bedtime. These can disrupt your sleep.  Do not overeat or eat spicy foods right before bedtime. This can lead to digestive discomfort that can make it hard for you to sleep.  Limit screen use before bedtime. This includes:  Watching TV.  Using your smartphone, tablet, and computer.  Stick to a routine. This can help you fall asleep faster. Try to do a quiet activity, brush your teeth, and go to bed at the same time each night.  Get out of bed if you are still awake after 15 minutes of trying to sleep. Keep the lights down, but try reading or doing a quiet activity. When you feel sleepy, go back to bed.  Make sure that you drive carefully. Avoid driving if you feel very sleepy.  Keep all follow-up appointments as directed by your  health care provider. This is important. SEEK MEDICAL CARE IF:   You are tired throughout the day or have trouble in your daily routine due to sleepiness.  You continue to have sleep problems or your sleep problems get worse. SEEK IMMEDIATE MEDICAL CARE IF:   You have serious thoughts about hurting yourself or someone else.   This information is not intended to replace advice given to you by your health care provider. Make sure you discuss any questions you have with your health care provider.   Document Released: 11/12/2000 Document Revised: 08/06/2015 Document Reviewed: 08/16/2014 Elsevier Interactive Patient Education [employer].       I personally performed the services described in this documentation, which was scribed in my presence. The recorded information has been reviewed and considered, and addended by me as needed.

## 2016-01-22 LAB — PSA: PSA: 0.51 ng/mL (ref <=4.00)

## 2016-01-29 ENCOUNTER — Other Ambulatory Visit: Payer: Self-pay | Admitting: Family Medicine

## 2016-01-29 ENCOUNTER — Other Ambulatory Visit: Payer: Self-pay | Admitting: Physician Assistant

## 2016-02-17 ENCOUNTER — Other Ambulatory Visit: Payer: Self-pay | Admitting: Rheumatology

## 2016-02-17 ENCOUNTER — Ambulatory Visit
Admission: RE | Admit: 2016-02-17 | Discharge: 2016-02-17 | Disposition: A | Discharge status: Home / Self Care | Payer: BC Managed Care – PPO | Source: Ambulatory Visit | Attending: Rheumatology | Admitting: Rheumatology

## 2016-02-17 DIAGNOSIS — R772 Abnormality of alphafetoprotein: Secondary | ICD-10-CM

## 2016-04-29 ENCOUNTER — Emergency Department (HOSPITAL_COMMUNITY): Payer: BC Managed Care – PPO

## 2016-04-29 ENCOUNTER — Encounter (HOSPITAL_COMMUNITY): Payer: Self-pay | Admitting: Emergency Medicine

## 2016-04-29 ENCOUNTER — Emergency Department (HOSPITAL_COMMUNITY)
Admission: EM | Admit: 2016-04-29 | Discharge: 2016-04-30 | Disposition: A | Discharge status: Home / Self Care | Payer: BC Managed Care – PPO | Source: Home / Self Care | Attending: Emergency Medicine | Admitting: Emergency Medicine

## 2016-04-29 DIAGNOSIS — N189 Chronic kidney disease, unspecified: Secondary | ICD-10-CM

## 2016-04-29 DIAGNOSIS — I129 Hypertensive chronic kidney disease with stage 1 through stage 4 chronic kidney disease, or unspecified chronic kidney disease: Secondary | ICD-10-CM | POA: Insufficient documentation

## 2016-04-29 DIAGNOSIS — E876 Hypokalemia: Secondary | ICD-10-CM | POA: Diagnosis not present

## 2016-04-29 DIAGNOSIS — R739 Hyperglycemia, unspecified: Secondary | ICD-10-CM | POA: Diagnosis not present

## 2016-04-29 DIAGNOSIS — R1032 Left lower quadrant pain: Secondary | ICD-10-CM | POA: Diagnosis not present

## 2016-04-29 DIAGNOSIS — R740 Nonspecific elevation of levels of transaminase and lactic acid dehydrogenase [LDH]: Secondary | ICD-10-CM | POA: Diagnosis not present

## 2016-04-29 DIAGNOSIS — R109 Unspecified abdominal pain: Secondary | ICD-10-CM | POA: Diagnosis present

## 2016-04-29 DIAGNOSIS — N179 Acute kidney failure, unspecified: Secondary | ICD-10-CM | POA: Diagnosis not present

## 2016-04-29 DIAGNOSIS — R61 Generalized hyperhidrosis: Secondary | ICD-10-CM | POA: Diagnosis not present

## 2016-04-29 DIAGNOSIS — R748 Abnormal levels of other serum enzymes: Secondary | ICD-10-CM

## 2016-04-29 DIAGNOSIS — D649 Anemia, unspecified: Secondary | ICD-10-CM | POA: Diagnosis not present

## 2016-04-29 DIAGNOSIS — R945 Abnormal results of liver function studies: Secondary | ICD-10-CM

## 2016-04-29 DIAGNOSIS — R1084 Generalized abdominal pain: Secondary | ICD-10-CM

## 2016-04-29 DIAGNOSIS — Z79899 Other long term (current) drug therapy: Secondary | ICD-10-CM | POA: Insufficient documentation

## 2016-04-29 DIAGNOSIS — Z8546 Personal history of malignant neoplasm of prostate: Secondary | ICD-10-CM | POA: Insufficient documentation

## 2016-04-29 DIAGNOSIS — K5900 Constipation, unspecified: Secondary | ICD-10-CM | POA: Diagnosis not present

## 2016-04-29 DIAGNOSIS — R7989 Other specified abnormal findings of blood chemistry: Secondary | ICD-10-CM

## 2016-04-29 LAB — CBC
HEMATOCRIT: 36.8 % — AB (ref 39.0–52.0)
Hemoglobin: 11.9 g/dL — ABNORMAL LOW (ref 13.0–17.0)
MCH: 22.2 pg — ABNORMAL LOW (ref 26.0–34.0)
MCHC: 32.3 g/dL (ref 30.0–36.0)
MCV: 68.8 fL — ABNORMAL LOW (ref 78.0–100.0)
PLATELETS: 130 10*3/uL — AB (ref 150–400)
RBC: 5.35 MIL/uL (ref 4.22–5.81)
RDW: 15 % (ref 11.5–15.5)
WBC: 10.9 10*3/uL — ABNORMAL HIGH (ref 4.0–10.5)

## 2016-04-29 LAB — COMPREHENSIVE METABOLIC PANEL
ALBUMIN: 3.5 g/dL (ref 3.5–5.0)
ALT: 126 U/L — ABNORMAL HIGH (ref 17–63)
ANION GAP: 7 (ref 5–15)
AST: 239 U/L — ABNORMAL HIGH (ref 15–41)
Alkaline Phosphatase: 233 U/L — ABNORMAL HIGH (ref 38–126)
BILIRUBIN TOTAL: 1.7 mg/dL — AB (ref 0.3–1.2)
BUN: 19 mg/dL (ref 6–20)
CO2: 27 mmol/L (ref 22–32)
Calcium: 9.1 mg/dL (ref 8.9–10.3)
Chloride: 103 mmol/L (ref 101–111)
Creatinine, Ser: 1.67 mg/dL — ABNORMAL HIGH (ref 0.61–1.24)
GFR calc non Af Amer: 40 mL/min — ABNORMAL LOW (ref >60)
GFR, EST AFRICAN AMERICAN: 47 mL/min — AB (ref >60)
GLUCOSE: 141 mg/dL — AB (ref 65–99)
POTASSIUM: 3.3 mmol/L — AB (ref 3.5–5.1)
SODIUM: 137 mmol/L (ref 135–145)
TOTAL PROTEIN: 6.7 g/dL (ref 6.5–8.1)

## 2016-04-29 LAB — URINALYSIS, ROUTINE W REFLEX MICROSCOPIC
Bilirubin Urine: NEGATIVE
Glucose, UA: NEGATIVE mg/dL
Hgb urine dipstick: NEGATIVE
KETONES UR: NEGATIVE mg/dL
LEUKOCYTES UA: NEGATIVE
NITRITE: NEGATIVE
PH: 7 (ref 5.0–8.0)
PROTEIN: NEGATIVE mg/dL
Specific Gravity, Urine: 1.015 (ref 1.005–1.030)

## 2016-04-29 LAB — LIPASE, BLOOD: Lipase: 39 U/L (ref 11–51)

## 2016-04-29 MED ORDER — ONDANSETRON HCL 4 MG/2ML IJ SOLN
4.0000 mg | Freq: Once | INTRAMUSCULAR | Status: AC
  Administered 2016-04-29: 4 mg via INTRAVENOUS
  Filled 2016-04-29: qty 2

## 2016-04-29 MED ORDER — MORPHINE SULFATE (PF) 4 MG/ML IV SOLN
4.0000 mg | Freq: Once | INTRAVENOUS | Status: AC
  Administered 2016-04-29: 4 mg via INTRAVENOUS
  Filled 2016-04-29: qty 1

## 2016-04-29 MED ORDER — IOPAMIDOL (ISOVUE-300) INJECTION 61%
INTRAVENOUS | Status: AC
  Administered 2016-04-29: 75 mL
  Filled 2016-04-29: qty 75

## 2016-04-29 MED ORDER — PANTOPRAZOLE SODIUM 20 MG PO TBEC: 20.0000 mg | DELAYED_RELEASE_TABLET | Freq: Every day | ORAL | Status: DC

## 2016-04-29 NOTE — ED Notes (Signed)
Urine clicked off by error. Pt made aware pt need urine sample.

## 2016-04-29 NOTE — ED Notes (Addendum)
EMs reports pt had a sudden onset on mid abd pain at 1500 after eating [employer]. Pt denies any n/v/d.

## 2016-04-29 NOTE — ED Provider Notes (Signed)
CSN: [id number]     Arrival date & time 04/29/16  1821 History   First MD Initiated Contact with Patient 04/29/16 1825     Chief Complaint  Patient presents with  . Abdominal Pain     (Consider location/radiation/quality/duration/timing/severity/associated sxs/prior Treatment) HPI   Patient is a 70 year old male past medical history of hypertension, renal insufficiency, prostate cancer who presents the ED with complaint of diffuse abdominal tenderness, onset 3 PM. Patient reports after eating a foot-long Tuna this afternoon he had sudden onset diffuse constant abdominal aching/cramping pain. Patient reports pain is worse with standing up, denies any alleviating factors. Patient denies taking any medications prior to arrival. Denies fever, chills, cough, shortness of breath, chest pain, nausea, vomiting, diarrhea, constipation, urinary symptoms, blood in urine or stool. Last BM this morning, pt reports normal. Denies history of abdominal surgeries.  Past Medical History  Diagnosis Date  . Hypertension   . Prostate cancer ([place])   . Chronic kidney disease    Past Surgical History  Procedure Laterality Date  . Hydrocele excision  2012  . Finger ganglion cyst excision  2011    3rd finger right hand   Family History  Problem Relation Age of Onset  . Hypertension Sister   . Hypertension Brother   . Colon cancer Neg Hx   . Hypertension Mother   . Hypertension Brother   . Stroke Sister   . Hypertension Sister    Social History  Substance Use Topics  . Smoking status: Never Smoker   . Smokeless tobacco: Never Used  . Alcohol Use: No    Review of Systems  Gastrointestinal: Positive for abdominal pain.  All other systems reviewed and are negative.     Allergies  Lisinopril; Neuromuscular blocking agents; Pyridium; and Sulfonamide derivatives  Home Medications   Prior to Admission medications   Medication Sig Start Date End Date Taking? Authorizing Provider  alfuzosin  (UROXATRAL) 10 MG 24 hr tablet TAKE 1 TABLET (10 MG TOTAL) BY MOUTH DAILY WITH BREAKFAST 01/21/16  Yes [name], MD  b complex vitamins tablet Take 1 tablet by mouth daily. Reported on 01/21/2016   Yes Historical Provider, MD  cholecalciferol (VITAMIN D) 1000 units tablet Take 1,000 Units by mouth daily.   Yes Historical Provider, MD  diltiazem (TIAZAC) 180 MG 24 hr capsule TAKE 1 CAPSULE (240 MG TOTAL) BY MOUTH DAILY. 01/21/16  Yes [name], MD  losartan-hydrochlorothiazide (HYZAAR) 100-12.5 MG tablet Take 1 tablet by mouth daily. 01/21/16  Yes [name], MD  OVER THE COUNTER MEDICATION    Yes Historical Provider, MD  OVER THE COUNTER MEDICATION OTC Fish Oil taking everyday   Yes Historical Provider, MD  pantoprazole (PROTONIX) 20 MG tablet Take 1 tablet (20 mg total) by mouth daily. 04/29/16   Chesley Noon Nade[place], PA-C   BP 147/78 mmHg  Pulse 71  Temp(Src) 97.8 F (36.6 C) (Oral)  Resp 19  Ht 6' 1"  (1.854 m)  Wt 102.059 kg  BMI 29.69 kg/m2  SpO2 100% Physical Exam  Constitutional: He is oriented to person, place, and time. He appears well-developed and well-nourished.  HENT:  Head: Normocephalic and atraumatic.  Mouth/Throat: Uvula is midline, oropharynx is clear and moist and mucous membranes are normal. No oropharyngeal exudate, posterior oropharyngeal edema, posterior oropharyngeal erythema or tonsillar abscesses.  Eyes: Conjunctivae and EOM are normal. Right eye exhibits no discharge. Left eye exhibits no discharge. No scleral icterus.  Neck: Normal range of motion. Neck supple.  Cardiovascular: Normal rate, regular rhythm and intact distal pulses.   Murmur heard.  Systolic murmur is present with a grade of 3/6  Pulmonary/Chest: Effort normal and breath sounds normal. No respiratory distress. He has no wheezes. He has no rales. He exhibits no tenderness.  Abdominal: Soft. He exhibits no distension and no mass. There is tenderness. There is no rigidity, no  rebound, no guarding and negative Murphy's sign. No hernia.  Diffuse abdominal tenderness, worse in LLQ. Decreased bowel sounds  Musculoskeletal: He exhibits no edema.  Neurological: He is alert and oriented to person, place, and time.  Skin: Skin is warm and dry. He is not diaphoretic.  Nursing note and vitals reviewed.   ED Course  Procedures (including critical care time) Labs Review Labs Reviewed  COMPREHENSIVE METABOLIC PANEL - Abnormal; Notable for the following:    Potassium 3.3 (*)    Glucose, Bld 141 (*)    Creatinine, Ser 1.67 (*)    AST 239 (*)    ALT 126 (*)    Alkaline Phosphatase 233 (*)    Total Bilirubin 1.7 (*)    GFR calc non Af Amer 40 (*)    GFR calc Af Amer 47 (*)    All other components within normal limits  CBC - Abnormal; Notable for the following:    WBC 10.9 (*)    Hemoglobin 11.9 (*)    HCT 36.8 (*)    MCV 68.8 (*)    MCH 22.2 (*)    Platelets 130 (*)    All other components within normal limits  LIPASE, BLOOD  URINALYSIS, ROUTINE W REFLEX MICROSCOPIC (NOT AT [facility])    Imaging Review US Abdomen Complete  04/29/2016  CLINICAL DATA:  Elevated AST and ALT.  Abdominal pain today. EXAM: ABDOMEN ULTRASOUND COMPLETE COMPARISON:  CT earlier this day FINDINGS: Gallbladder: Distended. Wall thickness of 3 mm. No gallstones visualized. No sonographic Murphy sign noted by sonographer. Common bile duct: Diameter: 2 mm, normal. Liver: No focal lesion identified. Within normal limits in parenchymal echogenicity. Normal directional flow in the main portal vein. IVC: No abnormality visualized. Pancreas: Visualized portion unremarkable, not well visualized, majority is obscured. Spleen: Size and appearance within normal limits. Right Kidney: Length: 10.4 cm. Echogenicity within normal limits. No mass or hydronephrosis visualized. Left Kidney: Length: 12.1 Cm. Echogenicity within normal limits. No mass or hydronephrosis visualized. Abdominal aorta: No aneurysm visualized  proximally. Mid and distal abdominal aorta are obscured by bowel gas. Other findings: None. IMPRESSION: 1. Mild gallbladder distention but no gallstones or pericholecystic inflammation. This is of doubtful clinical significance. 2. Otherwise unremarkable abdominal ultrasound. Electronically Signed   By: Jeb Levering M.D.   On: 04/29/2016 22:58   Ct Abdomen Pelvis W Contrast  04/29/2016  CLINICAL DATA:  Acute abdominal pain, worse in the left lower quadrant after eating dinner. History of hypertension, chronic pelvic Paget's disease, and prostate cancer. EXAM: CT ABDOMEN AND PELVIS WITH CONTRAST TECHNIQUE: Multidetector CT imaging of the abdomen and pelvis was performed using the standard protocol following bolus administration of intravenous contrast. CONTRAST:  100 ISOVUE-300 IOPAMIDOL (ISOVUE-300) INJECTION 61% COMPARISON:  10/10/2015 FINDINGS: Lower chest: Minor dependent bibasilar atelectasis. Mildly prominent heart size. No pericardial or pleural effusion. Hepatobiliary: No focal hepatic abnormality or biliary dilatation. Patent hepatic and portal veins. Mild distention of the gallbladder, nonspecific. No biliary dilatation. Pancreas: No mass, inflammatory changes, or other significant abnormality. Spleen: Within normal limits in size and appearance. Adrenals/Urinary Tract: No masses identified. No evidence of  hydronephrosis. Stomach/Bowel: Negative for bowel obstruction, significant dilatation, ileus, or free air. Normal appendix demonstrated. Colonic diverticulosis, most pronounced in the descending colon and sigmoid in the left lower quadrant. No acute inflammatory process. Incidental duodenum diverticulum in the midline as before, image 9. Vascular/Lymphatic: No adenopathy. Aortic atherosclerosis evident without aneurysm. No retroperitoneal abnormality or hemorrhage. Reproductive: Mild prostate enlargement. Radiopaque markers at the prostate base. Seminal vesicles unremarkable. Other: No inguinal  abnormality or hernia. Abdominal wall demonstrates a stable small umbilical hernia with a loop of small bowel but no obstruction or incarceration. Musculoskeletal: Degenerative changes present of the spine. Extensive thickening, sclerosis and trabeculation of the bony pelvis and sacrum without significant change compatible with Paget's disease. However, Chronic treated bony metastases from known prostate cancer could have a similar appearance. IMPRESSION: No acute intra-abdominal or pelvic finding by CT. Nonspecific gallbladder distension without biliary obstruction. Stable chronic duodenal diverticulum and stable small umbilical hernia. Atherosclerosis of the aorta without aneurysm Diverticulosis without acute inflammatory process Chronic pelvic and sacral sclerosis, thickening and trabeculation compatible with chronic Paget's disease versus treated metastases. Electronically Signed   By: Jerilynn Mages.  Shick M.D.   On: 04/29/2016 21:16   I have personally reviewed and evaluated these images and lab results as part of my medical decision-making.   EKG Interpretation None      MDM   Final diagnoses:  Generalized abdominal pain  Elevated LFTs   Pt presents with sudden onset diffuse abdominal pain that occurred appx. 30 minutes after eating a foot-long tuna sub. Denies fever, N/V, diarrhea or constipation. VSS. Exam revealed diffuse abdominal tenderness, worse in LLQ, no peritoneal signs, remaining exam unremarkable. Pt refused pain meds. Plan to order labs and CT abdomen.  WBC 10.9. Cr 1.67 (chart review shows pt with Cr 1.2-1.6), AST 239, ALT 126, Alk phos 233, Total Bili 1.7.   Chart review showed pt with PCP (Dr. Carlota Raspberry) follow up over the past year for renal insufficiency and elevated alk phos, most recent visit in  Febuary 2017. Pt was seen by rheumatology, he had a borderline elevated PTH with elevated bone and liver alk-phos, CT scan in his abdomen on November 11th 2016 with abnormal sacrum and iliac  bones, recommended bone scan. Bone scan did show signs of Paget's disease. Bone scan on 10/28/15 showed stable appearance of increased uptake around the sacrum; no findings to suggest metastatic disease; possible chronic Paget's disease. Pt with history of prostate cancer, followed by Dr. Louis Meckel, [facility].   Discussed case with Dr. Canary Brim who evaluated the pt. Pt reports pain has continued and is requesting pain meds, pt given IV morphine. CT abdomen revealed nonspecific gallbladder distention without biliary obstruction, no acute abdominal finding. Due to pt's elevated AST and ALT with associated abdominal pain, will order RUQ [place] for further evaluation. US showed mild gallbladder distention but no gallstones or pericholecystic inflammation, otherwise unremarkable. On reevaluation, pt reports his pain has improved. Discussed results and plan for d/c with pt. Pt able to tolerate PO. Plan to d/c pt home with protonix and advised pt to follow up with PCP for follow up and to have to liver enzymes rechecked. Discussed strict return precautions with pt.     Chesley Noon [place], [place] 04/29/16 [place], MD 04/29/16 [id number]

## 2016-04-29 NOTE — Discharge Instructions (Signed)
Take your medications as prescribed.  Follow up with your primary care provider in the next 3-4 days for follow up visit and to have your liver enzymes rechecked. Today your AST was elevated at 239 and ALT was 126. Please return to the Emergency Department if symptoms worsen or new onset of fever, worsening abdominal pain, vomiting, vomiting, blood, unable to keep fluids down, chest pain, shortness of breath, blood in stool.

## 2016-04-29 NOTE — ED Notes (Signed)
Patient transported to CT 

## 2016-04-30 ENCOUNTER — Encounter (HOSPITAL_COMMUNITY): Payer: Self-pay | Admitting: *Deleted

## 2016-04-30 ENCOUNTER — Observation Stay (HOSPITAL_COMMUNITY)
Admission: AD | Admit: 2016-04-30 | Discharge: 2016-05-01 | Disposition: A | Discharge status: Home / Self Care | Payer: BC Managed Care – PPO | Source: Ambulatory Visit | Attending: Family Medicine | Admitting: Family Medicine

## 2016-04-30 ENCOUNTER — Observation Stay (HOSPITAL_COMMUNITY): Payer: BC Managed Care – PPO

## 2016-04-30 ENCOUNTER — Ambulatory Visit (INDEPENDENT_AMBULATORY_CARE_PROVIDER_SITE_OTHER): Payer: BC Managed Care – PPO | Admitting: Emergency Medicine

## 2016-04-30 VITALS — BP 122/60 | HR 64 | Temp 100.1°F | Resp 16 | Ht 73.0 in | Wt 232.0 lb

## 2016-04-30 DIAGNOSIS — R1030 Lower abdominal pain, unspecified: Secondary | ICD-10-CM

## 2016-04-30 DIAGNOSIS — Z79899 Other long term (current) drug therapy: Secondary | ICD-10-CM | POA: Insufficient documentation

## 2016-04-30 DIAGNOSIS — R509 Fever, unspecified: Secondary | ICD-10-CM | POA: Diagnosis not present

## 2016-04-30 DIAGNOSIS — Z8546 Personal history of malignant neoplasm of prostate: Secondary | ICD-10-CM | POA: Insufficient documentation

## 2016-04-30 DIAGNOSIS — D649 Anemia, unspecified: Secondary | ICD-10-CM | POA: Insufficient documentation

## 2016-04-30 DIAGNOSIS — R740 Nonspecific elevation of levels of transaminase and lactic acid dehydrogenase [LDH]: Secondary | ICD-10-CM | POA: Insufficient documentation

## 2016-04-30 DIAGNOSIS — R7989 Other specified abnormal findings of blood chemistry: Secondary | ICD-10-CM | POA: Diagnosis not present

## 2016-04-30 DIAGNOSIS — R945 Abnormal results of liver function studies: Secondary | ICD-10-CM

## 2016-04-30 DIAGNOSIS — K5900 Constipation, unspecified: Secondary | ICD-10-CM | POA: Diagnosis not present

## 2016-04-30 DIAGNOSIS — R739 Hyperglycemia, unspecified: Secondary | ICD-10-CM | POA: Insufficient documentation

## 2016-04-30 DIAGNOSIS — N189 Chronic kidney disease, unspecified: Secondary | ICD-10-CM | POA: Insufficient documentation

## 2016-04-30 DIAGNOSIS — N179 Acute kidney failure, unspecified: Secondary | ICD-10-CM | POA: Insufficient documentation

## 2016-04-30 DIAGNOSIS — R61 Generalized hyperhidrosis: Secondary | ICD-10-CM

## 2016-04-30 DIAGNOSIS — R109 Unspecified abdominal pain: Secondary | ICD-10-CM | POA: Diagnosis present

## 2016-04-30 DIAGNOSIS — E876 Hypokalemia: Secondary | ICD-10-CM | POA: Insufficient documentation

## 2016-04-30 DIAGNOSIS — R1032 Left lower quadrant pain: Secondary | ICD-10-CM | POA: Diagnosis not present

## 2016-04-30 DIAGNOSIS — I129 Hypertensive chronic kidney disease with stage 1 through stage 4 chronic kidney disease, or unspecified chronic kidney disease: Secondary | ICD-10-CM | POA: Insufficient documentation

## 2016-04-30 LAB — RAPID URINE DRUG SCREEN, HOSP PERFORMED
AMPHETAMINES: NOT DETECTED
BARBITURATES: NOT DETECTED
BENZODIAZEPINES: NOT DETECTED
COCAINE: NOT DETECTED
OPIATES: NOT DETECTED
TETRAHYDROCANNABINOL: NOT DETECTED

## 2016-04-30 LAB — POCT CBC
Granulocyte percent: 93.4 %G — AB (ref 37–80)
HEMATOCRIT: 36.1 % — AB (ref 43.5–53.7)
HEMOGLOBIN: 12.2 g/dL — AB (ref 14.1–18.1)
Lymph, poc: 0.5 — AB (ref 0.6–3.4)
MCH, POC: 23.1 pg — AB (ref 27–31.2)
MCHC: 33.7 g/dL (ref 31.8–35.4)
MCV: 68.4 fL — AB (ref 80–97)
MID (cbc): 0.2 (ref 0–0.9)
MPV: 7.2 fL (ref 0–99.8)
POC GRANULOCYTE: 10 — AB (ref 2–6.9)
POC LYMPH PERCENT: 4.5 %L — AB (ref 10–50)
POC MID %: 2.1 % (ref 0–12)
Platelet Count, POC: 126 10*3/uL — AB (ref 142–424)
RBC: 5.27 M/uL (ref 4.69–6.13)
RDW, POC: 15.1 %
WBC: 10.7 10*3/uL — AB (ref 4.6–10.2)

## 2016-04-30 LAB — ACETAMINOPHEN LEVEL: Acetaminophen (Tylenol), Serum: <10 ug/mL — ABNORMAL LOW (ref 10–30)

## 2016-04-30 LAB — COMPREHENSIVE METABOLIC PANEL
ALK PHOS: 250 U/L — AB (ref 38–126)
ALT: 941 U/L — AB (ref 17–63)
AST: 660 U/L — AB (ref 15–41)
Albumin: 3.2 g/dL — ABNORMAL LOW (ref 3.5–5.0)
Anion gap: 7 (ref 5–15)
BUN: 16 mg/dL (ref 6–20)
CALCIUM: 8.4 mg/dL — AB (ref 8.9–10.3)
CHLORIDE: 105 mmol/L (ref 101–111)
CO2: 25 mmol/L (ref 22–32)
CREATININE: 1.74 mg/dL — AB (ref 0.61–1.24)
GFR calc Af Amer: 45 mL/min — ABNORMAL LOW (ref >60)
GFR, EST NON AFRICAN AMERICAN: 39 mL/min — AB (ref >60)
Glucose, Bld: 91 mg/dL (ref 65–99)
Potassium: 3.9 mmol/L (ref 3.5–5.1)
SODIUM: 137 mmol/L (ref 135–145)
Total Bilirubin: 4.7 mg/dL — ABNORMAL HIGH (ref 0.3–1.2)
Total Protein: 6.6 g/dL (ref 6.5–8.1)

## 2016-04-30 LAB — GLUCOSE, CAPILLARY: Glucose-Capillary: 163 mg/dL — ABNORMAL HIGH (ref 65–99)

## 2016-04-30 LAB — TROPONIN I
Troponin I: 0.14 ng/mL — ABNORMAL HIGH (ref <0.031)
Troponin I: 0.16 ng/mL — ABNORMAL HIGH (ref <0.031)

## 2016-04-30 LAB — TSH: TSH: 1.18 u[IU]/mL (ref 0.350–4.500)

## 2016-04-30 MED ORDER — LOSARTAN POTASSIUM 50 MG PO TABS
100.0000 mg | ORAL_TABLET | Freq: Every day | ORAL | Status: DC
  Administered 2016-05-01: 100 mg via ORAL
  Filled 2016-04-30: qty 2

## 2016-04-30 MED ORDER — IBUPROFEN 800 MG PO TABS
800.0000 mg | ORAL_TABLET | Freq: Four times a day (QID) | ORAL | Status: DC | PRN
  Administered 2016-04-30: 800 mg via ORAL
  Filled 2016-04-30: qty 1

## 2016-04-30 MED ORDER — DILTIAZEM HCL ER COATED BEADS 180 MG PO CP24
180.0000 mg | ORAL_CAPSULE | Freq: Every day | ORAL | Status: DC
  Administered 2016-04-30: 180 mg via ORAL
  Filled 2016-04-30: qty 1

## 2016-04-30 MED ORDER — DILTIAZEM HCL ER BEADS 180 MG PO CP24
180.0000 mg | ORAL_CAPSULE | Freq: Every day | ORAL | Status: DC
  Filled 2016-04-30: qty 1

## 2016-04-30 MED ORDER — PANTOPRAZOLE SODIUM 20 MG PO TBEC
20.0000 mg | DELAYED_RELEASE_TABLET | Freq: Every day | ORAL | Status: DC
  Administered 2016-05-01: 20 mg via ORAL
  Filled 2016-04-30: qty 1

## 2016-04-30 MED ORDER — ALFUZOSIN HCL ER 10 MG PO TB24
10.0000 mg | ORAL_TABLET | Freq: Every day | ORAL | Status: DC
  Filled 2016-04-30: qty 1

## 2016-04-30 MED ORDER — ENOXAPARIN SODIUM 40 MG/0.4ML ~~LOC~~ SOLN: 40.0000 mg | SUBCUTANEOUS | Status: DC

## 2016-04-30 MED ORDER — ALFUZOSIN HCL ER 10 MG PO TB24
10.0000 mg | ORAL_TABLET | Freq: Every day | ORAL | Status: DC
  Administered 2016-04-30: 10 mg via ORAL
  Filled 2016-04-30: qty 1

## 2016-04-30 MED ORDER — SENNOSIDES-DOCUSATE SODIUM 8.6-50 MG PO TABS: 1.0000 | ORAL_TABLET | Freq: Every evening | ORAL | Status: DC | PRN

## 2016-04-30 MED ORDER — SODIUM CHLORIDE 0.9 % IV SOLN
INTRAVENOUS | Status: DC
  Administered 2016-04-30 – 2016-05-01 (×2): via INTRAVENOUS

## 2016-04-30 MED ORDER — LOSARTAN POTASSIUM-HCTZ 100-12.5 MG PO TABS: 1.0000 | ORAL_TABLET | Freq: Every day | ORAL | Status: DC

## 2016-04-30 MED ORDER — HYDROCHLOROTHIAZIDE 12.5 MG PO CAPS
12.5000 mg | ORAL_CAPSULE | Freq: Every day | ORAL | Status: DC
  Administered 2016-05-01: 12.5 mg via ORAL
  Filled 2016-04-30: qty 1

## 2016-04-30 NOTE — Patient Instructions (Signed)
     IF you received an x-ray today, you will receive an invoice from [facility]. Please contact [facility] at [phone] with questions or concerns regarding your invoice.   IF you received labwork today, you will receive an invoice from [facility]. Please contact [facility] at [phone] with questions or concerns regarding your invoice.   Our billing staff will not be able to assist you with questions regarding bills from these companies.  You will be contacted with the lab results as soon as they are available. The fastest way to get your results is to activate your My Chart account. Instructions are located on the last page of this paperwork. If you have not heard from us regarding the results in 2 weeks, please contact this office.      

## 2016-04-30 NOTE — Progress Notes (Signed)
Patient ID: Jeffrey Perry, male   DOB: 18-Jun-1946, 70 y.o.   MRN: [id number]    By signing my name below, I, Essence Howell, attest that this documentation has been prepared under the direction and in the presence of [name], MD Electronically Signed: Ladene Artist, ED Scribe 04/30/2016 at 2:03 PM  Chief Complaint:  Chief Complaint  Patient presents with  . Follow-up    hospital visit  . Fever    100.1   HPI: Jeffrey Perry is a 70 y.o. male who reports to [facility] today for a follow-up regarding ED visit. Pt was seen in the ED yesterday for LUQ abdominal pain onset yesterday after consuming a tuna sub on whole wheat with lettuce and tomato from [employer]. He had an abdominal US done that showed a distended 3 mm gallbladder and cholecystitis. Today, pt states that LUQ abdominal pain has improved some but he is still experiencing persistent fever since yesterday, chills, diaphoresis and HA. Triage temperature 100.1 F. Pt has tried 2 Advil tablets around 12 PM today without significant relief. He denies nausea and diarrhea.   Malaria Vaccine Pt plans to travel to his home country on [place] and requests a Malaria vaccine at this visit.    Past Medical History  Diagnosis Date  . Hypertension   . Prostate cancer ([place])   . Chronic kidney disease    Past Surgical History  Procedure Laterality Date  . Hydrocele excision  2012  . Finger ganglion cyst excision  2011    3rd finger right hand   Social History   Social History  . Marital Status: Married    Spouse Name: [name]  . Number of Children: [name]  . Years of Education: [name]   Occupational History  . professor    Social History Main Topics  . Smoking status: Never Smoker   . Smokeless tobacco: Never Used  . Alcohol Use: No  . Drug Use: No  . Sexual Activity:    Partners: Female   Other Topics Concern  . None   Social History Narrative   Family History  Problem Relation Age of Onset  . Hypertension Sister   . Hypertension Brother    . Colon cancer Neg Hx   . Hypertension Mother   . Hypertension Brother   . Stroke Sister   . Hypertension Sister    Allergies  Allergen Reactions  . Lisinopril Cough  . Neuromuscular Blocking Agents Swelling    ankles  . Pyridium [Phenazopyridine Hcl] Swelling    ankles  . Sulfonamide Derivatives Itching   Prior to Admission medications   Medication Sig Start Date End Date Taking? Authorizing Provider  alfuzosin (UROXATRAL) 10 MG 24 hr tablet TAKE 1 TABLET (10 MG TOTAL) BY MOUTH DAILY WITH BREAKFAST 01/21/16  Yes [name], MD  b complex vitamins tablet Take 1 tablet by mouth daily. Reported on 01/21/2016   Yes Historical Provider, MD  cholecalciferol (VITAMIN D) 1000 units tablet Take 1,000 Units by mouth daily.   Yes Historical Provider, MD  diltiazem (TIAZAC) 180 MG 24 hr capsule TAKE 1 CAPSULE (240 MG TOTAL) BY MOUTH DAILY. 01/21/16  Yes [name], MD  losartan-hydrochlorothiazide (HYZAAR) 100-12.5 MG tablet Take 1 tablet by mouth daily. 01/21/16  Yes [name], MD  OVER THE COUNTER MEDICATION    Yes Historical Provider, MD  OVER THE COUNTER MEDICATION OTC Fish Oil taking everyday   Yes Historical Provider, MD  pantoprazole (PROTONIX) 20 MG tablet Take 1  tablet (20 mg total) by mouth daily. 04/29/16  Yes Nona Dell, PA-C   ROS: The patient denies unintentional weight loss, chest pain, palpitations, wheezing, dyspnea on exertion, nausea, vomiting, dysuria, hematuria, melena, numbness, weakness, or tingling.   All other systems have been reviewed and were otherwise negative with the exception of those mentioned in the HPI and as above.    PHYSICAL EXAM: Filed Vitals:   04/30/16 1252  BP: 122/60  Pulse: 64  Temp: 100.1 F (37.8 C)  Resp: 16   Body mass index is 30.62 kg/(m^2).  General: Alert, no acute distress. Ill appearing but not toxic appearing HEENT:  Normocephalic, atraumatic, oropharynx patent. Eye: Juliette M[facility] Cardiovascular:  Regular rate and rhythm, no rubs murmurs or gallops. No Carotid bruits, radial pulse intact. No pedal edema.  Respiratory: Clear to auscultation bilaterally. No wheezes, rales, or rhonchi. No cyanosis, no use of accessory musculature Abdominal: Bowel sounds present. LLQ tenderness without rebound. Minimal LUQ tenderness. No organomegaly, abdomen is soft. No masses.  Musculoskeletal: Gait intact. No edema, tenderness Skin: No rashes. Neurologic: Facial musculature symmetric. Psychiatric: Patient acts appropriately throughout our interaction. Lymphatic: No cervical or submandibular lymphadenopathy  LABS: Results for orders placed or performed in visit on 04/30/16  POCT CBC  Result Value Ref Range   WBC 10.7 (A) 4.6 - 10.2 K/uL   Lymph, poc 0.5 (A) 0.6 - 3.4   POC LYMPH PERCENT 4.5 (A) 10 - 50 %L   MID (cbc) 0.2 0 - 0.9   POC MID % 2.1 0 - 12 %M   POC Granulocyte 10.0 (A) 2 - 6.9   Granulocyte percent 93.4 (A) 37 - 80 %G   RBC 5.27 4.69 - 6.13 M/uL   Hemoglobin 12.2 (A) 14.1 - 18.1 g/dL   HCT, POC 36.1 (A) 43.5 - 53.7 %   MCV 68.4 (A) 80 - 97 fL   MCH, POC 23.1 (A) 27 - 31.2 pg   MCHC 33.7 31.8 - 35.4 g/dL   RDW, POC 15.1 %   Platelet Count, POC 126 (A) 142 - 424 K/uL   MPV 7.2 0 - 99.8 fL   EKG/XRAY:   Primary read interpreted by Dr. Everlene Farrier at [facility].  ASSESSMENT/PLAN: Not clear to me what is going on. His liver function tests are elevated. His platelets are slightly low. He has tenderness deep in the left lower abdomen but minimal right upper abdomen. His CT and ultrasound the abdomen showed a distended gallbladder. Patient is ill he was somewhat orthostatic by pulse readings. IV fluids were started. Direct admission arranged with the hospital.    Gross sideeffects, risk and benefits, and alternatives of medications d/w patient. Patient is aware that all medications have potential sideeffects and we are unable to predict every sideeffect or drug-drug interaction that may  occur.  [name] MD 04/30/2016 1:31 PM

## 2016-04-30 NOTE — ED Notes (Signed)
Pt left in NAD.

## 2016-04-30 NOTE — H&P (Signed)
[facility] Admission History and Physical Service Pager: [phone]  Patient name: Jeffrey Perry Medical record number: [id number] Date of birth: 02/06/1946 Age: 70 y.o. Gender: male  Primary Care Provider: Kennon Portela, MD Consultants: Surgery Code Status: Full  Chief Complaint: Abdominal Pain  Assessment and Plan: Jeffrey Perry is a 70 y.o. male presenting with Abdominal Pain. PMH is significant for Prostate Cancer, HTN, and Renal Insufficiency.  # Abdominal Pain: Pain in LLQ. CT abdomen and pelvis with no acute intra-abdominal or pelvic findings, nonspecific gallbladder distension without biliary obstruction, and diverticulosis with acute inflammatory process. [place] with mild gallbladder distention but no gallstones or pericholecystic inflammation, suspected to be of little clinical significance per radiology. WBC mildly elevated at 10.7. CMP from 6/1 with elevated Alkaline Phosphatase (233), AST (239), ALT (126), and elevated Total Bilirubin (1.7). Urinalysis normal. Last colonoscopy in 2014 with diverticulosis, next due in 2024. Afebrile with normal HR, Respirations, and BP. Benign abdominal exam with mild tenderness in left lower quadrant, no rebound, no Rovsings, no Murphy's. No jaundice, fever, altered mental status. Suspect Diverticulosis vs. Constipation for LLQ pain. Unsure of etiology for elevated LFTs, but will initiate workup. - Place in Observation, Ch[place]liss attending. - Monitor for fever. Afebrile here, s/p Ibuprofen. - Clear liquid diet. Advance as tolerated. - Reports PCP was concerned about an EKG and his heart. No EKG in chart. Will order EKG and troponin x1. - Repeat CMP - Hepatitis panel pending - Consider fractionating bilirubin to determine conjugated vs. unconjugated insult. Suspect conjugated hyperbilirubinemia given elevated alkaline phosphatase. - Consider surgical consult if worsening abdominal pain occurs. - Ibuprofen PRN Fever/Moderate  Pain. Consider additional pain medication if needed. Tylenol contraindicated given liver function. - Senna PRN constipation - Continue home Protonix  # Night Sweats: Reports has been present for 3 months. History of travel to [place] in June 2016.  - Will obtain TSH, HIV - CXR - Consider PPD as outpatient.   # Anemia: Hemoglobin 12.2.  - Monitor on CBC  # Hypokalemia: Potassium on 6/1 3.3. - Follow up CMP - Replete if remains low  # Hyperglycemia: Glucose elevated to 141 on 6/1. - A1C - Continue to monitor Glucose  # Acute on Chronic Kidney Disease: Creatinine elevated to 1.67 on 6/1. Baseline 1.2-1.3. Suspect pre-renal due to poor PO intake. - MIVF with NS @145cc /hr - CMP pending.   # Hypertension: Home medications include Diltiazem 250mg  daily and Losartan/HCTZ 100/12.5mg . - Stable - Continue home medication.  # H/O Prostate Cancer: Home medications include Alfuzosin. Last PSA normal in 12/2015. - Continue home medication  FEN/GI: Clear Liquid Prophylaxis: Lovenox  Disposition: Home  History of Present Illness:  Jeffrey Perry is a 70 y.o. male presenting with abdominal pain. Abdominal pain first started around 1pm on 6/1. Was initially diffuse. Presented to ED and was discharge following workup and improvement of pain. Since that time, pain has significantly improved and is now mainly located in left lower quadrant. Denies nausea or vomiting. No measured fevers, however felt subjectively warm and took Ibuprofen at lunch time. Has had decreased PO intake. Reports constipation, with last bowel movement Wednesday, 04/28/16. Denies dysuria or urinary frequency. Denies chest pain or shortness of breath. Presented to PCP, who was concerned about the imaging in the ED, continued abdominal pain, elevated white count, and abnormal LFTs. Patient also reports PCP was concerned about his heart and EKG. Contacted by PCP for direct admission.  Review Of Systems: Per HPI  Otherwise the remainder  of the systems were negative.  Patient Active Problem List   Diagnosis Date Noted  . Renal insufficiency 03/05/2013  . HTN (hypertension) 03/06/2012  . Prostate cancer ([place]) 10/04/2011    Past Medical History: Past Medical History  Diagnosis Date  . Hypertension   . Prostate cancer ([place])   . Chronic kidney disease     Past Surgical History: Past Surgical History  Procedure Laterality Date  . Hydrocele excision  2012  . Finger ganglion cyst excision  2011    3rd finger right hand    Social History: Social History  Substance Use Topics  . Smoking status: Never Smoker   . Smokeless tobacco: Never Used  . Alcohol Use: No   Please also refer to relevant sections of EMR.  Family History: Family History  Problem Relation Age of Onset  . Hypertension Sister   . Hypertension Brother   . Colon cancer Neg Hx   . Hypertension Mother   . Hypertension Brother   . Stroke Sister   . Hypertension Sister    Allergies and Medications: Allergies  Allergen Reactions  . Lisinopril Cough  . Neuromuscular Blocking Agents Swelling    ankles  . Pyridium [Phenazopyridine Hcl] Swelling    ankles  . Sulfonamide Derivatives Itching   No current facility-administered medications on file prior to encounter.   Current Outpatient Prescriptions on File Prior to Encounter  Medication Sig Dispense Refill  . alfuzosin (UROXATRAL) 10 MG 24 hr tablet TAKE 1 TABLET (10 MG TOTAL) BY MOUTH DAILY WITH BREAKFAST 90 tablet 1  . b complex vitamins tablet Take 1 tablet by mouth daily. Reported on 01/21/2016    . cholecalciferol (VITAMIN D) 1000 units tablet Take 1,000 Units by mouth daily.    [name] Kitchen diltiazem (TIAZAC) 180 MG 24 hr capsule TAKE 1 CAPSULE (240 MG TOTAL) BY MOUTH DAILY. 90 capsule 1  . losartan-hydrochlorothiazide (HYZAAR) 100-12.5 MG tablet Take 1 tablet by mouth daily. 90 tablet 1  . OVER THE COUNTER MEDICATION     . OVER THE COUNTER MEDICATION OTC Fish Oil taking everyday    .  pantoprazole (PROTONIX) 20 MG tablet Take 1 tablet (20 mg total) by mouth daily. 30 tablet 0    Objective: There were no vitals taken for this visit. Exam: General: 70yo male resting comfortably in no apparent distress Eyes: PERRLA, white sclera ENTM: Dry mucous membranes Neck: Supple Cardiovascular: Regular rate and rhythm. No murmurs. Respiratory: Clear to auscultation bilaterally. No wheezes. Abdomen: Nondistended. Bowel sound noted. Mild tenderness in LLQ. Negative Murphy's. Negative Rebound. Negative Rovsings. MSK: No edema. Skin: No rash. Neuro: No focal deficits Psych: AAOx3  Labs and Imaging: CBC BMET   Recent Labs Lab 04/29/16 1848 04/30/16 1400  WBC 10.9* 10.7*  HGB 11.9* 12.2*  HCT 36.8* 36.1*  PLT 130*  --     Recent Labs Lab 04/29/16 1848  NA 137  K 3.3*  CL 103  CO2 27  BUN 19  CREATININE 1.67*  GLUCOSE 141*  CALCIUM 9.1  AST 239 ALT 126 Alkaline Phosphatase 233   US Abdomen Complete  04/29/2016  CLINICAL DATA:  Elevated AST and ALT.  Abdominal pain today. EXAM: ABDOMEN ULTRASOUND COMPLETE COMPARISON:  CT earlier this day FINDINGS: Gallbladder: Distended. Wall thickness of 3 mm. No gallstones visualized. No sonographic Murphy sign noted by sonographer. Common bile duct: Diameter: 2 mm, normal. Liver: No focal lesion identified. Within normal limits in parenchymal echogenicity. Normal directional flow in the main portal vein. IVC: No  abnormality visualized. Pancreas: Visualized portion unremarkable, not well visualized, majority is obscured. Spleen: Size and appearance within normal limits. Right Kidney: Length: 10.4 cm. Echogenicity within normal limits. No mass or hydronephrosis visualized. Left Kidney: Length: 12.1 Cm. Echogenicity within normal limits. No mass or hydronephrosis visualized. Abdominal aorta: No aneurysm visualized proximally. Mid and distal abdominal aorta are obscured by bowel gas. Other findings: None. IMPRESSION: 1. Mild gallbladder  distention but no gallstones or pericholecystic inflammation. This is of doubtful clinical significance. 2. Otherwise unremarkable abdominal ultrasound. Electronically Signed   By: Jeb Levering M.D.   On: 04/29/2016 22:58   Ct Abdomen Pelvis W Contrast  04/29/2016  CLINICAL DATA:  Acute abdominal pain, worse in the left lower quadrant after eating dinner. History of hypertension, chronic pelvic Paget's disease, and prostate cancer. EXAM: CT ABDOMEN AND PELVIS WITH CONTRAST TECHNIQUE: Multidetector CT imaging of the abdomen and pelvis was performed using the standard protocol following bolus administration of intravenous contrast. CONTRAST:  100 ISOVUE-300 IOPAMIDOL (ISOVUE-300) INJECTION 61% COMPARISON:  10/10/2015 FINDINGS: Lower chest: Minor dependent bibasilar atelectasis. Mildly prominent heart size. No pericardial or pleural effusion. Hepatobiliary: No focal hepatic abnormality or biliary dilatation. Patent hepatic and portal veins. Mild distention of the gallbladder, nonspecific. No biliary dilatation. Pancreas: No mass, inflammatory changes, or other significant abnormality. Spleen: Within normal limits in size and appearance. Adrenals/Urinary Tract: No masses identified. No evidence of hydronephrosis. Stomach/Bowel: Negative for bowel obstruction, significant dilatation, ileus, or free air. Normal appendix demonstrated. Colonic diverticulosis, most pronounced in the descending colon and sigmoid in the left lower quadrant. No acute inflammatory process. Incidental duodenum diverticulum in the midline as before, image 63. Vascular/Lymphatic: No adenopathy. Aortic atherosclerosis evident without aneurysm. No retroperitoneal abnormality or hemorrhage. Reproductive: Mild prostate enlargement. Radiopaque markers at the prostate base. Seminal vesicles unremarkable. Other: No inguinal abnormality or hernia. Abdominal wall demonstrates a stable small umbilical hernia with a loop of small bowel but no  obstruction or incarceration. Musculoskeletal: Degenerative changes present of the spine. Extensive thickening, sclerosis and trabeculation of the bony pelvis and sacrum without significant change compatible with Paget's disease. However, Chronic treated bony metastases from known prostate cancer could have a similar appearance. IMPRESSION: No acute intra-abdominal or pelvic finding by CT. Nonspecific gallbladder distension without biliary obstruction. Stable chronic duodenal diverticulum and stable small umbilical hernia. Atherosclerosis of the aorta without aneurysm Diverticulosis without acute inflammatory process Chronic pelvic and sacral sclerosis, thickening and trabeculation compatible with chronic Paget's disease versus treated metastases. Electronically Signed   By: Jerilynn Mages.  Shick M.D.   On: 04/29/2016 21:16    Lorna Few, DO 04/30/2016, 4:35 PM PGY-2, [facility] Intern pager: [phone], text pages welcome

## 2016-04-30 NOTE — Progress Notes (Signed)
CMP returned showing worsening LFTs. Alkaline Phosphatase 2x normal at 250, both AST and ALT both ~15x normal at 660 and 941 respectively. Total Bilirubin elevated at 4.7. Reviewed medications with pharmacy and none of his current medications would explain LFTs. Did recommend SCDs instead of Lovenox or Heparin for DVT prophylaxis as they can worsen LFTs in 4-6%. Will check Acetaminophen level, autoimmune lab workup, HIV. Will discuss use of supplements.   Also of note, Troponin also elevated. Denies chest pain or shortness of breath. Will obtain EKG. Repeat in 3 hours.  Dr. Gerlean Ren 04/30/16, 9:04 PM

## 2016-04-30 NOTE — Progress Notes (Signed)
Dr. Erin Hearing made aware pt.'s arrival.

## 2016-05-01 DIAGNOSIS — R61 Generalized hyperhidrosis: Secondary | ICD-10-CM | POA: Diagnosis not present

## 2016-05-01 LAB — HEPATITIS PANEL, ACUTE
HCV AB: NEGATIVE
HEP B C IGM: NONREACTIVE
HEP B S AG: NEGATIVE
Hep A IgM: NONREACTIVE

## 2016-05-01 LAB — COMPREHENSIVE METABOLIC PANEL
ALK PHOS: 222 U/L — AB (ref 38–126)
ALT: 673 U/L — ABNORMAL HIGH (ref 17–63)
ANION GAP: 10 (ref 5–15)
AST: 359 U/L — ABNORMAL HIGH (ref 15–41)
Albumin: 2.7 g/dL — ABNORMAL LOW (ref 3.5–5.0)
BUN: 13 mg/dL (ref 6–20)
CALCIUM: 8.3 mg/dL — AB (ref 8.9–10.3)
CO2: 22 mmol/L (ref 22–32)
Chloride: 105 mmol/L (ref 101–111)
Creatinine, Ser: 1.51 mg/dL — ABNORMAL HIGH (ref 0.61–1.24)
GFR calc non Af Amer: 46 mL/min — ABNORMAL LOW (ref >60)
GFR, EST AFRICAN AMERICAN: 53 mL/min — AB (ref >60)
Glucose, Bld: 75 mg/dL (ref 65–99)
POTASSIUM: 3.4 mmol/L — AB (ref 3.5–5.1)
SODIUM: 137 mmol/L (ref 135–145)
TOTAL PROTEIN: 5.5 g/dL — AB (ref 6.5–8.1)
Total Bilirubin: 5 mg/dL — ABNORMAL HIGH (ref 0.3–1.2)

## 2016-05-01 LAB — CBC
HCT: 34.3 % — ABNORMAL LOW (ref 39.0–52.0)
HEMOGLOBIN: 11.2 g/dL — AB (ref 13.0–17.0)
MCH: 21.7 pg — ABNORMAL LOW (ref 26.0–34.0)
MCHC: 32.7 g/dL (ref 30.0–36.0)
MCV: 66.5 fL — ABNORMAL LOW (ref 78.0–100.0)
Platelets: 112 10*3/uL — ABNORMAL LOW (ref 150–400)
RBC: 5.16 MIL/uL (ref 4.22–5.81)
RDW: 14.8 % (ref 11.5–15.5)
WBC: 6.9 10*3/uL (ref 4.0–10.5)

## 2016-05-01 LAB — HIV ANTIBODY (ROUTINE TESTING W REFLEX): HIV SCREEN 4TH GENERATION: NONREACTIVE

## 2016-05-01 MED ORDER — SENNOSIDES-DOCUSATE SODIUM 8.6-50 MG PO TABS: 1.0000 | ORAL_TABLET | Freq: Every evening | ORAL | Status: DC | PRN

## 2016-05-01 MED ORDER — POTASSIUM CHLORIDE CRYS ER 20 MEQ PO TBCR: 40.0000 meq | EXTENDED_RELEASE_TABLET | Freq: Once | ORAL | Status: DC

## 2016-05-01 NOTE — Discharge Summary (Signed)
[facility] Discharge Summary  Patient name: Jeffrey Perry Medical record number: [id number] Date of birth: 09-27-1946 Age: 70 y.o. Gender: male Date of Admission: 04/30/2016  Date of Discharge: 05/01/2016 Admitting Physician: Lind Covert, MD  Primary Care Provider: Kennon Portela, MD Consultants: None  Indication for Hospitalization: Abdominal Pain  Discharge Diagnoses/Problem List:  Abdominal Pain Constipation Hypokalemia Hyperglycemia Acute on Chronic Kidney Disease Hypertension  Disposition: Home  Discharge Condition: Stable  Discharge Exam: Please see progress note from 05/01/2016  Brief Hospital Course:  Mr. Rathsack is a 70yo male presenting for abdominal pain. Initially seen in ED on 04/29/2016 with diffuse abdominal pain. CT of the abdomen and pelvis showed no acute intra-abdominal or pelvic findings, nonspecific gallbladder distention without biliary obstruction, and diverticulosis with acute inflammatory process. US showed mild gallbladder distention but no gallstones or pericholecystic inflammation, suspected to be of little clinical significance per radiology.CMP showed elevated Alkaline Phosphatase to 233, elevated AST to 239, elevated ALT to 126, and elevated total bilirubin to 1.7. Urinalysis was normal. Discharged home following improvement in abdominal pain.  Presented to PCP on 6/2 and was found to have improved abdominal pain to LLQ. PCP was concerned about continued abdominal pain, transaminitis, and clinical imaging and directly admitted to [facility] on [address]. On examination at admission, mild LLQ tenderness without rebound, negative Rovsing's, and negative Murphy's. Afebrile throughout hospitalization. Clear liquid diet during hospitalization. CMP at admission showed worsening transaminitis with alkaline phosphatase of 250, AST of 660, ALT of 941, and total bilirubin of 4.7. Surgery consult was  considered but not obtained given mild abdominal pain on exam without acute surgical abdomen present. TSH obtained and was normal. HIV obtained and was nonreactive. Hepatitis panel obtained and was normal. Troponins stable with normal sinus rhythm on EKG. Acetaminophen level negative. Toxicity panel negative.  Also of note, hyperglycemia noted. A1C pending at discharge.  Acute kidney injury noted with creatinine elevated to 1.67 at admission. Improved to 1.51 prior to discharge. Suspected to be pre-renal given poor PO intake in days prior to hospitalization. Encouraged PO fluids.  Abdominal pain continued to improve throughout hospitalization and was negligible at discharge. On day of discharge, transaminitis had also improved with alkaline phosphatase of 222, AST of 359, ALT of 673, and total bilirubin of 5. Autoimmune hepatitis panel still pending at discharge. Suspect transaminitis secondary to passed gallstone vs. Ingested agent (initiated new probiotic with cholesterol support in days prior to presentation). Encouraged to discontinue any non-prescribed medications or supplements. Anticipate continued improvement in transaminitis with discontinuing of non-essential medications or passage of gallstone. To follow up with PCP for pending labs and to continue workup of transaminitis if needed.  Issues for Follow Up:  1. Follow up Autoimmune Hepatitis labs 2. Consider PPD as outpatient for Night Sweats. CXR negative. 3. Follow up A1C. Hyperglycemia noted.  Significant Procedures: None  Significant Labs and Imaging:   Recent Labs Lab 04/29/16 1848 04/30/16 1400 05/01/16 0634 05/02/16 2037  WBC 10.9* 10.7* 6.9 3.9*  HGB 11.9* 12.2* 11.2* 11.8*  HCT 36.8* 36.1* 34.3* 35.4*  PLT 130*  --  112* 118*    Recent Labs Lab 04/29/16 1848 04/30/16 1830 05/01/16 0634 05/02/16 2037  NA 137 137 137 134*  K 3.3* 3.9 3.4* 3.8  CL 103 105 105 104  CO2 27 25 22 26   GLUCOSE 141* 91 75 122*  BUN 19  16 13 8   CREATININE 1.67* 1.74* 1.51* 1.36*  CALCIUM 9.1 8.4* 8.3* 9.1  ALKPHOS 233* 250* 222* 231*  AST 239* 660* 359* 144*  ALT 126* 941* 673* 387*  ALBUMIN 3.5 3.2* 2.7* 2.7*  - Acetaminophen level <10 - TSH 1.180 - Hepatitis Panel negative - Toxicity Screen negative  Dg Chest 1 View  04/30/2016  CLINICAL DATA:  Acute onset of diaphoresis.  Initial encounter. EXAM: CHEST 1 VIEW COMPARISON:  Chest radiograph performed 03/06/2012 FINDINGS: The lungs are well-aerated. Vascular congestion is noted. Mild left basilar opacity may reflect atelectasis or possibly mild pneumonia. There is no evidence of pleural effusion or pneumothorax. The cardiomediastinal silhouette is mildly enlarged. No acute osseous abnormalities are seen. IMPRESSION: Vascular congestion and mild cardiomegaly. Mild left basilar opacity may reflect atelectasis or possibly mild pneumonia. Electronically Signed   By: Garald Balding M.D.   On: 04/30/2016 21:26   US Abdomen Complete  04/29/2016  CLINICAL DATA:  Elevated AST and ALT.  Abdominal pain today. EXAM: ABDOMEN ULTRASOUND COMPLETE COMPARISON:  CT earlier this day FINDINGS: Gallbladder: Distended. Wall thickness of 3 mm. No gallstones visualized. No sonographic Murphy sign noted by sonographer. Common bile duct: Diameter: 2 mm, normal. Liver: No focal lesion identified. Within normal limits in parenchymal echogenicity. Normal directional flow in the main portal vein. IVC: No abnormality visualized. Pancreas: Visualized portion unremarkable, not well visualized, majority is obscured. Spleen: Size and appearance within normal limits. Right Kidney: Length: 10.4 cm. Echogenicity within normal limits. No mass or hydronephrosis visualized. Left Kidney: Length: 12.1 Cm. Echogenicity within normal limits. No mass or hydronephrosis visualized. Abdominal aorta: No aneurysm visualized proximally. Mid and distal abdominal aorta are obscured by bowel gas. Other findings: None. IMPRESSION: 1. Mild  gallbladder distention but no gallstones or pericholecystic inflammation. This is of doubtful clinical significance. 2. Otherwise unremarkable abdominal ultrasound. Electronically Signed   By: Jeb Levering M.D.   On: 04/29/2016 22:58   Ct Abdomen Pelvis W Contrast  04/29/2016  CLINICAL DATA:  Acute abdominal pain, worse in the left lower quadrant after eating dinner. History of hypertension, chronic pelvic Paget's disease, and prostate cancer. EXAM: CT ABDOMEN AND PELVIS WITH CONTRAST TECHNIQUE: Multidetector CT imaging of the abdomen and pelvis was performed using the standard protocol following bolus administration of intravenous contrast. CONTRAST:  100 ISOVUE-300 IOPAMIDOL (ISOVUE-300) INJECTION 61% COMPARISON:  10/10/2015 FINDINGS: Lower chest: Minor dependent bibasilar atelectasis. Mildly prominent heart size. No pericardial or pleural effusion. Hepatobiliary: No focal hepatic abnormality or biliary dilatation. Patent hepatic and portal veins. Mild distention of the gallbladder, nonspecific. No biliary dilatation. Pancreas: No mass, inflammatory changes, or other significant abnormality. Spleen: Within normal limits in size and appearance. Adrenals/Urinary Tract: No masses identified. No evidence of hydronephrosis. Stomach/Bowel: Negative for bowel obstruction, significant dilatation, ileus, or free air. Normal appendix demonstrated. Colonic diverticulosis, most pronounced in the descending colon and sigmoid in the left lower quadrant. No acute inflammatory process. Incidental duodenum diverticulum in the midline as before, image 9. Vascular/Lymphatic: No adenopathy. Aortic atherosclerosis evident without aneurysm. No retroperitoneal abnormality or hemorrhage. Reproductive: Mild prostate enlargement. Radiopaque markers at the prostate base. Seminal vesicles unremarkable. Other: No inguinal abnormality or hernia. Abdominal wall demonstrates a stable small umbilical hernia with a loop of small bowel but  no obstruction or incarceration. Musculoskeletal: Degenerative changes present of the spine. Extensive thickening, sclerosis and trabeculation of the bony pelvis and sacrum without significant change compatible with Paget's disease. However, Chronic treated bony metastases from known prostate cancer could have a similar appearance. IMPRESSION: No acute intra-abdominal or  pelvic finding by CT. Nonspecific gallbladder distension without biliary obstruction. Stable chronic duodenal diverticulum and stable small umbilical hernia. Atherosclerosis of the aorta without aneurysm Diverticulosis without acute inflammatory process Chronic pelvic and sacral sclerosis, thickening and trabeculation compatible with chronic Paget's disease versus treated metastases. Electronically Signed   By: Jerilynn Mages.  Shick M.D.   On: 04/29/2016 21:16   Results/Tests Pending at Time of Discharge: Autoimmune Hepatitis Panel  Discharge Medications:    Medication List    STOP taking these medications        b complex vitamins tablet     cholecalciferol 1000 units tablet  Commonly known as:  VITAMIN D     Fish Oil 1000 MG Caps     PROBIOTIC PO      TAKE these medications        alfuzosin 10 MG 24 hr tablet  Commonly known as:  UROXATRAL  TAKE 1 TABLET (10 MG TOTAL) BY MOUTH DAILY WITH BREAKFAST     diltiazem 180 MG 24 hr capsule  Commonly known as:  TIAZAC  TAKE 1 CAPSULE (240 MG TOTAL) BY MOUTH DAILY.     ibuprofen 200 MG tablet  Commonly known as:  ADVIL,MOTRIN  Take 400 mg by mouth every 6 (six) hours as needed (pain).     losartan-hydrochlorothiazide 100-12.5 MG tablet  Commonly known as:  HYZAAR  Take 1 tablet by mouth daily.     pantoprazole 20 MG tablet  Commonly known as:  PROTONIX  Take 1 tablet (20 mg total) by mouth daily.     senna-docusate 8.6-50 MG tablet  Commonly known as:  Senokot-S  Take 1 tablet by mouth at bedtime as needed for mild constipation.        Discharge Instructions: Please  refer to Patient Instructions section of EMR for full details.  Patient was counseled important signs and symptoms that should prompt return to medical care, changes in medications, dietary instructions, activity restrictions, and follow up appointments.   Follow-Up Appointments: Follow-up Information    Follow up with DAUB, Lina Sayre, MD.   Specialty:  Family Medicine   Why:  3-5 Days to Follow Up Pending Labs   Contact information:   [place] [id number] [phone]       [place], DO 05/02/2016, 11:51 PM PGY-2, [place]

## 2016-05-01 NOTE — Discharge Instructions (Signed)
We believe your abdominal pain was due to either a gallstone you've passed or a medication you've taken. Please stop all medications except for those you've been instructed to continue. Your liver enzymes were improving at discharge. The workup so far has been normal, however there are still a few labs pending. Please follow up with your PCP to discuss the remaining labs and determine if further workup is indicated. If any critical lab values return, you will be contacted. If you develop worsening abdominal pain, jaundice, or any other concerning symptoms, please return to the ED for further evaluation.  Liver Function Tests Liver function tests are blood tests to see how well your liver is working. The proteins and enzymes measured in the test can alert your health care provider to inflammation, damage, or disease in your liver. It is common to have liver function tests:  During annual physical exams.  When you are taking certain medicines.  If you have liver disease.  If you drink a lot of alcohol.  When you are not feeling well.  When you have other conditions that may affect the liver. Substances measured may include:   Alanine transaminase (ALT). This is an enzyme in the liver.  Aspartate transaminase (AST). This is an enzyme in the liver, heart, and muscles.  Alkaline phosphatase (ALP). This is a protein in the liver, bile ducts, and bone. It is also in other body tissues.  Total bilirubin. This is a yellow pigment in bile.  Albumin. This is a protein in the liver.  Prothrombin time and international normalized ratio (PT and INR). PT measures the time that it takes for your blood to clot. INR is a calculation of blood clotting time based upon your PT result. It is also calculated based on normal ranges defined by the laboratory that processed your lab test.  Total protein. This measures two proteins, albumin and globulin, found in the blood. PREPARATION FOR TEST How you prepare  will depend on which tests are being done and the reason why these tests are being done. You may need to:  Avoid eating for 4-6 hours before the test or as directed by your health care provider.  Stop taking certain medicines prior to your blood test as directed by your health care provider. RESULTS  It is your responsibility to obtain your test results. Ask the lab or department performing the test when and how you will get your results. Contact your health care provider to discuss any questions you have about your results. RANGE OF NORMAL VALUES Ranges for normal values may vary among different labs and hospitals. You should always check with your health care provider after having lab work or other tests done to discuss the meaning of your test results and whether your values are considered within normal limits. The following are normal ranges for substances measured in liver function tests: ALT  Infant: may be twice as high as adult values.  Child or adult: 4-36 international units/L at 37C or 4-36 units/L (SI units).  Elderly: may be slightly higher than adult values. AST  Newborn 6-53 days old: 35-140 units/L.  Child under 90 years old: 15-60 units/L.  30-19 years old: 15-50 units/L.  33-58 years old: 10-50 units/L.  60-26 years old: 10-40 units/L.  Adult: 0-35 units/L or 0-0.58 microkatal/L (SI units).  Elderly: slightly higher than adults. ALP  Child under 8 years old: 85-235 units/L.  69-39 years old: 65-210 units/L.  35-48 years old: 60-300 units/L.  16-21 years  old: 30-200 units/L.  Adult: 30-120 units/L or 0.5-2.0 microkatal/L (SI units).  Elderly: slightly higher than adult. Total bilirubin  Newborn: 1.0-12.0 mg/dL or 17.1-205 micromoles/L (SI units).  Adult, elderly, or child: 0.3-1.0 mg/dL or 5.1-17 micromoles/L. Albumin  Premature infant: 3.0-4.2 g/dL.  Newborn: 3.5-5.4 g/dL.  Infant: 4.4-5.4 g/dL.  Child: 4.0-5.9 g/dL.  Adult or elderly: 3.5-5.0  g/dL or 35-50 g/L (SI units). PT  11.0-12.5 seconds; 85%-100%. INR  0.8-1.1. Total protein  Premature infant: 4.2-7.6 g/dL.  Newborn: 4.6-7.4 g/dL.  Infant: 6.0-6.7 g/dL.  Child: 6.2-8.0 g/dL.  Adult or elderly: 6.4-8.3 g/dL or 64-83 g/L (SI units). MEANING OF RESULTS OUTSIDE NORMAL VALUE RANGES Sometimes test results can be abnormal due to other factors, such as medicines, exercise, or pregnancy. Follow up with your health care provider if you have any questions about test results outside the normal value ranges. ALT  Levels above the normal range, along with other test results, may indicate liver disease. AST  Levels above the normal range, along with other test results, may indicate liver disease. Sometimes levels also increase after burns, surgery, heart attack, muscle damage, or seizure. ALP  Levels above the normal range, along with other test results, may indicate biliary obstruction, diseases of the liver, bone disease, thyroid disease, tumors, fractures, leukemia or lymphoma, or several other conditions. People with blood type O or B may show higher levels after a fatty meal.  Levels below the normal range, along with other test results, may indicate bone and teeth conditions, malnutrition, protein deficiency, or Wilson disease. Total bilirubin  Levels above the normal range, along with other test results, may indicate problems with the liver, gallbladder, or bile ducts. Albumin  Levels above the normal range, along with other test results, may indicate dehydration. They may also be caused by a diet that is high in protein. Sometimes, the band placed around the upper arm during the process of drawing blood can cause the level of this protein in your blood to rise and give you a result above the normal range.  Levels below the normal range, along with other tests results, may indicate kidney disease, liver disease, or malabsorption of nutrients. PT and INR  Levels  above the normal range mean your blood is clotting slower than normal. This may be due to blood disorders, liver disorders, or low levels of vitamin K. Total protein  Levels above the normal range, along with other test results, may be due to infection or other diseases.  Levels below the normal range, along with other test results, may be due to an immune system disorder, bleeding, burns, kidney disorder, liver disease, trouble absorbing or getting enough nutrients, or other conditions that affect the intestines.   This information is not intended to replace advice given to you by your health care provider. Make sure you discuss any questions you have with your health care provider.   Document Released: 12/18/2004 Document Revised: 12/06/2014 Document Reviewed: 03/21/2014 Elsevier Interactive Patient Education [employer].

## 2016-05-01 NOTE — Progress Notes (Signed)
Family Medicine Teaching Service Daily Progress Note Intern Pager: [phone]  Patient name: Jeffrey Perry Medical record number: [id number] Date of birth: [birth year] Age: 70 y.o. Gender: male  Primary Care Provider: Kennon Portela, MD Consultants: None Code Status: Full  Assessment and Plan: 70 y.o. male presenting with Abdominal Pain. PMH is significant for Prostate Cancer, HTN, and Renal Insufficiency.  # Abdominal Pain: Suspect Diverticulosis vs. Constipation. Improved. WBC improving, 6.9 today. - Clear liquid diet. Advance as tolerated. - Consider surgical consult if worsening abdominal pain occurs. - Ibuprofen PRN Fever/Moderate Pain. Consider additional pain medication if needed. Tylenol contraindicated given liver function. - Senna PRN constipation - Continue home Protonix  # Transaminitis: Unknown etiology. Improving. CT and [place] prior to admission with mildly distended gallbladder.  - Alkaline Phosphatase: [id number] - AST: [id number] - ALT: [id number] - Total Bilirubin 1.7>4.7>5 - Acetaminophen level <10 - Hepatitis Panel negative - Autoimmune workup pending - Discussed Probiotic with Cholesterol Support used by patient with pharmacy. Reports not all ingredients are listed on the bottle and they recommend discontinuing at discharge.  # Hypokalemia: - Potassium 3.4 today, replete.  # Night Sweats: Reports has been present for 3 months. History of travel to [place] in June 2016. TSH normal. CXR with atelectasis. - Follow up HIV - Consider PPD as outpatient.   # Anemia: Hemoglobin 11.2  - Monitor on CBC  # Hyperglycemia: Glucose elevated to 141 on 6/1. - A1C - Continue to monitor Glucose  # Acute on Chronic Kidney Disease: Creatinine elevated to 1.67 on 6/1. Baseline 1.2-1.3. Suspect pre-renal due to poor PO intake. - MIVF with NS @145cc /hr - Creatinine improved to 1.51 - CMP pending.   FEN/GI: Clear Liquid Prophylaxis: Lovenox  Disposition: Discharge Home  Today  Subjective:  Feeling much better today. Very mild LLQ abdominal pain. Notes headache. Denies any new medications, however does admit to use [employer] Probiotic with Cholesterol Support.  Objective: Temp:  [98.9 F (37.2 C)-100.1 F (37.8 C)] 98.9 F (37.2 C) (06/03 0459) Pulse Rate:  [64-70] 64 (06/03 0459) Resp:  [16-17] 17 (06/02 2118) BP: (118-140)/(60-82) 132/75 mmHg (06/03 0459) SpO2:  [96 %-100 %] 99 % (06/03 0459) Weight:  [232 lb (105.235 kg)] 232 lb (105.235 kg) (06/02 2118) Physical Exam: General: 70yo male resting comfortably in no apparent distress Cardiovascular: Regular rate and rhythm, no murmurs Respiratory: Clear to auscultation bilaterally, no wheezes Abdomen: Soft and nondistended, bowel sounds noted, mild LLQ pain, negative Murphy's, negative Rebound, negative Rovsing Extremities: No edema  Laboratory:  Recent Labs Lab 04/29/16 1848 04/30/16 1400 05/01/16 0634  WBC 10.9* 10.7* 6.9  HGB 11.9* 12.2* 11.2*  HCT 36.8* 36.1* 34.3*  PLT 130*  --  PENDING    Recent Labs Lab 04/29/16 1848 04/30/16 1830 05/01/16 0634  NA 137 137 137  K 3.3* 3.9 3.4*  CL 103 105 105  CO2 27 25 22   BUN 19 16 13   CREATININE 1.67* 1.74* 1.51*  CALCIUM 9.1 8.4* 8.3*  PROT 6.7 6.6 5.5*  BILITOT 1.7* 4.7* 5.0*  ALKPHOS 233* 250* 222*  ALT 126* 941* 673*  AST 239* 660* 359*  GLUCOSE 141* 91 75  - Troponin 0.14>0.16 - Acteminophen <10 - TSH 1.18 - Hepatitis Panel negative  Imaging/Diagnostic Tests: Dg Chest 1 View  04/30/2016  CLINICAL DATA:  Acute onset of diaphoresis.  Initial encounter. EXAM: CHEST 1 VIEW COMPARISON:  Chest radiograph performed 03/06/2012 FINDINGS: The lungs are well-aerated. Vascular congestion is noted. Mild left basilar opacity may reflect atelectasis  or possibly mild pneumonia. There is no evidence of pleural effusion or pneumothorax. The cardiomediastinal silhouette is mildly enlarged. No acute osseous abnormalities are seen. IMPRESSION: Vascular  congestion and mild cardiomegaly. Mild left basilar opacity may reflect atelectasis or possibly mild pneumonia. Electronically Signed   By: Garald Balding M.D.   On: 04/30/2016 21:26    Lorna Few, DO 05/01/2016, 9:23 AM PGY-2, [place] Intern pager: [phone], text pages welcome

## 2016-05-01 NOTE — Progress Notes (Signed)
Pt discharged to home accompanied by wife.  Rx e-sent to [facility] on [address] to be picked up and they understand.  DC instructions copied and given to them, see chart.  Called to get a heart healthy diet ordered but then pt/wife left before it arrived.

## 2016-05-02 ENCOUNTER — Telehealth: Payer: Self-pay | Admitting: Emergency Medicine

## 2016-05-02 ENCOUNTER — Observation Stay (HOSPITAL_COMMUNITY)
Admission: EM | Admit: 2016-05-02 | Discharge: 2016-05-05 | Disposition: A | Discharge status: Home / Self Care | Payer: BC Managed Care – PPO | Attending: Family Medicine | Admitting: Family Medicine

## 2016-05-02 ENCOUNTER — Encounter (HOSPITAL_COMMUNITY): Payer: Self-pay

## 2016-05-02 DIAGNOSIS — N189 Chronic kidney disease, unspecified: Secondary | ICD-10-CM | POA: Diagnosis not present

## 2016-05-02 DIAGNOSIS — K828 Other specified diseases of gallbladder: Secondary | ICD-10-CM | POA: Insufficient documentation

## 2016-05-02 DIAGNOSIS — R1084 Generalized abdominal pain: Secondary | ICD-10-CM | POA: Diagnosis present

## 2016-05-02 DIAGNOSIS — R945 Abnormal results of liver function studies: Secondary | ICD-10-CM

## 2016-05-02 DIAGNOSIS — Z91040 Latex allergy status: Secondary | ICD-10-CM | POA: Diagnosis not present

## 2016-05-02 DIAGNOSIS — I129 Hypertensive chronic kidney disease with stage 1 through stage 4 chronic kidney disease, or unspecified chronic kidney disease: Secondary | ICD-10-CM | POA: Insufficient documentation

## 2016-05-02 DIAGNOSIS — Z888 Allergy status to other drugs, medicaments and biological substances status: Secondary | ICD-10-CM | POA: Diagnosis not present

## 2016-05-02 DIAGNOSIS — K5710 Diverticulosis of small intestine without perforation or abscess without bleeding: Secondary | ICD-10-CM | POA: Diagnosis not present

## 2016-05-02 DIAGNOSIS — C61 Malignant neoplasm of prostate: Secondary | ICD-10-CM | POA: Diagnosis not present

## 2016-05-02 DIAGNOSIS — I700 Atherosclerosis of aorta: Secondary | ICD-10-CM | POA: Diagnosis not present

## 2016-05-02 DIAGNOSIS — Z882 Allergy status to sulfonamides status: Secondary | ICD-10-CM | POA: Diagnosis not present

## 2016-05-02 DIAGNOSIS — K5900 Constipation, unspecified: Secondary | ICD-10-CM | POA: Diagnosis not present

## 2016-05-02 DIAGNOSIS — R109 Unspecified abdominal pain: Secondary | ICD-10-CM

## 2016-05-02 DIAGNOSIS — M889 Osteitis deformans of unspecified bone: Secondary | ICD-10-CM | POA: Diagnosis not present

## 2016-05-02 DIAGNOSIS — J9811 Atelectasis: Secondary | ICD-10-CM | POA: Insufficient documentation

## 2016-05-02 DIAGNOSIS — R17 Unspecified jaundice: Secondary | ICD-10-CM | POA: Diagnosis present

## 2016-05-02 DIAGNOSIS — Z79899 Other long term (current) drug therapy: Secondary | ICD-10-CM | POA: Diagnosis not present

## 2016-05-02 DIAGNOSIS — D61818 Other pancytopenia: Secondary | ICD-10-CM | POA: Diagnosis not present

## 2016-05-02 DIAGNOSIS — Z8249 Family history of ischemic heart disease and other diseases of the circulatory system: Secondary | ICD-10-CM | POA: Insufficient documentation

## 2016-05-02 DIAGNOSIS — Z823 Family history of stroke: Secondary | ICD-10-CM | POA: Insufficient documentation

## 2016-05-02 DIAGNOSIS — R7989 Other specified abnormal findings of blood chemistry: Secondary | ICD-10-CM

## 2016-05-02 DIAGNOSIS — B172 Acute hepatitis E: Secondary | ICD-10-CM | POA: Diagnosis not present

## 2016-05-02 HISTORY — DX: Osteitis deformans of unspecified bone: M88.9

## 2016-05-02 LAB — COMPREHENSIVE METABOLIC PANEL
ALBUMIN: 2.7 g/dL — AB (ref 3.5–5.0)
ALK PHOS: 231 U/L — AB (ref 38–126)
ALT: 387 U/L — ABNORMAL HIGH (ref 17–63)
ANION GAP: 4 — AB (ref 5–15)
AST: 144 U/L — ABNORMAL HIGH (ref 15–41)
BILIRUBIN TOTAL: 4.4 mg/dL — AB (ref 0.3–1.2)
BUN: 8 mg/dL (ref 6–20)
CALCIUM: 9.1 mg/dL (ref 8.9–10.3)
CO2: 26 mmol/L (ref 22–32)
Chloride: 104 mmol/L (ref 101–111)
Creatinine, Ser: 1.36 mg/dL — ABNORMAL HIGH (ref 0.61–1.24)
GFR, EST NON AFRICAN AMERICAN: 52 mL/min — AB (ref >60)
GLUCOSE: 122 mg/dL — AB (ref 65–99)
POTASSIUM: 3.8 mmol/L (ref 3.5–5.1)
Sodium: 134 mmol/L — ABNORMAL LOW (ref 135–145)
TOTAL PROTEIN: 6.8 g/dL (ref 6.5–8.1)

## 2016-05-02 LAB — CBC
HCT: 35.4 % — ABNORMAL LOW (ref 39.0–52.0)
HEMOGLOBIN: 11.8 g/dL — AB (ref 13.0–17.0)
MCH: 21.5 pg — AB (ref 26.0–34.0)
MCHC: 33.3 g/dL (ref 30.0–36.0)
MCV: 64.6 fL — ABNORMAL LOW (ref 78.0–100.0)
PLATELETS: 118 10*3/uL — AB (ref 150–400)
RBC: 5.48 MIL/uL (ref 4.22–5.81)
RDW: 14.3 % (ref 11.5–15.5)
WBC: 3.9 10*3/uL — AB (ref 4.0–10.5)

## 2016-05-02 LAB — LIPASE, BLOOD: Lipase: 55 U/L — ABNORMAL HIGH (ref 11–51)

## 2016-05-02 LAB — HEPATITIS PANEL, ACUTE
HEP A IGM: NEGATIVE
HEP B C IGM: NEGATIVE
HEP B S AG: NEGATIVE

## 2016-05-02 NOTE — Telephone Encounter (Signed)
I spoke with patient's wife. Patient has gotten progressively worse since his discharge. He continues to have fevers chills and abdominal discomfort. He also has had increasing darkening of his urine. I advised him to take him back to the emergency room for evaluation. I called the triage nurse to give an update on Mr. Isaksen and I also put a call into the family medicine resident on call.

## 2016-05-02 NOTE — ED Notes (Signed)
Patient complains of ongoing abdominal discomfort, fever and chills. Was admitted for same over the weekend and was discharged home yesterday am. No nausea, no vomiting, had 1 loose BM this am but reports not much. Spouse thinks some of this related to constipation. Has had CT, U/S, and no diagnosis upon discharge

## 2016-05-02 NOTE — ED Provider Notes (Signed)
CSN: [id number]     Arrival date & time 05/02/16  2023 History  By signing my name below, I, Jeffrey Perry, attest that this documentation has been prepared under the direction and in the presence of [name], MD . Electronically Signed: Rowan Perry, Scribe. 05/03/2016. 12:04 AM.   Chief Complaint  Patient presents with  . Abdominal Pain   The history is provided by the patient. No language interpreter was used.   HPI Comments:  Jeffrey Perry is a 70 y.o. male with PMhx of HTN, prostate cancer and CKD who presents to the Emergency Department complaining of moderate, constant abdominal pain, worse with cough. Pt reports associated chills, subjective fever and constipatoin. Pt was evaluated in the ED 3 days ago for similar symptoms. His abdominal pain started ~1 hour after eating a sandwich from [place]. Pt was admitted to the hospital by his PCP 2 days ago because his symptoms were not resolving and released yesterday morning. Pt has been on a liquid diet since discharge. Pt states the laxative he was given did not provide relief; he took Mirilax and prune juice with mild relief. Pt notes recent travel to [place] in August of 2016. He has noticed some yellowing of his palms and fingers. Daughter also reports pts urine has been dark and amber colored for the past 2 days; pt has been increasing water intake without relief.   Past Medical History  Diagnosis Date  . Hypertension   . Prostate cancer ([place])   . Chronic kidney disease    Past Surgical History  Procedure Laterality Date  . Hydrocele excision  2012  . Finger ganglion cyst excision  2011    3rd finger right hand   Family History  Problem Relation Age of Onset  . Hypertension Sister   . Hypertension Brother   . Colon cancer Neg Hx   . Hypertension Mother   . Hypertension Brother   . Stroke Sister   . Hypertension Sister    Social History  Substance Use Topics  . Smoking status: Never Smoker   . Smokeless tobacco: Never  Used  . Alcohol Use: No    Review of Systems  Constitutional: Positive for fever and chills.  Gastrointestinal: Positive for abdominal pain and constipation.  All other systems reviewed and are negative.   Allergies  Latex; Lisinopril; Neuromuscular blocking agents; Pyridium; and Sulfonamide derivatives  Home Medications   Prior to Admission medications   Medication Sig Start Date End Date Taking? Authorizing Provider  alfuzosin (UROXATRAL) 10 MG 24 hr tablet TAKE 1 TABLET (10 MG TOTAL) BY MOUTH DAILY WITH BREAKFAST Patient taking differently: Take 10 mg by mouth at bedtime.  01/21/16   [name], MD  diltiazem (TIAZAC) 180 MG 24 hr capsule TAKE 1 CAPSULE (240 MG TOTAL) BY MOUTH DAILY. Patient taking differently: Take 180 mg by mouth at bedtime.  01/21/16   [name], MD  ibuprofen (ADVIL,MOTRIN) 200 MG tablet Take 400 mg by mouth every 6 (six) hours as needed (pain).    Historical Provider, MD  losartan-hydrochlorothiazide (HYZAAR) 100-12.5 MG tablet Take 1 tablet by mouth daily. 01/21/16   [name], MD  pantoprazole (PROTONIX) 20 MG tablet Take 1 tablet (20 mg total) by mouth daily. 04/29/16   Nona Dell, PA-C  senna-docusate (SENOKOT-S) 8.6-50 MG tablet Take 1 tablet by mouth at bedtime as needed for mild constipation. 05/01/16   [place] N Rumley, DO   BP 150/74 mmHg  Pulse 57  Temp(Src)  100.6 F (38.1 C) (Oral)  Resp 16  Ht 6\' 1"  (1.854 m)  Wt 222 lb (100.699 kg)  BMI 29.30 kg/m2  SpO2 98%   Physical Exam  Constitutional: He is oriented to person, place, and time. Vital signs are normal. He appears well-developed and well-nourished.  Non-toxic appearance. He does not appear ill. No distress.  tactile fever  HENT:  Head: Normocephalic and atraumatic.  Nose: Nose normal.  Mouth/Throat: Oropharynx is clear and moist. No oropharyngeal exudate.  Oral anad scleral jaundice  Eyes: Conjunctivae and EOM are normal. Pupils are equal, round, and  reactive to light.  Neck: Normal range of motion. Neck supple. No tracheal deviation, no edema, no erythema and normal range of motion present. No thyroid mass and no thyromegaly present.  Cardiovascular: Normal rate, regular rhythm, S1 normal, S2 normal, normal heart sounds, intact distal pulses and normal pulses.  Exam reveals no gallop and no friction rub.   No murmur heard. Pulmonary/Chest: Effort normal and breath sounds normal. No respiratory distress. He has no wheezes. He has no rhonchi. He has no rales.  Abdominal: Soft. Normal appearance and bowel sounds are normal. He exhibits distension. He exhibits no ascites and no mass. There is no hepatosplenomegaly. There is no tenderness. There is no rebound, no guarding and no CVA tenderness.  Musculoskeletal: Normal range of motion. He exhibits no edema or tenderness.  Lymphadenopathy:    He has no cervical adenopathy.  Neurological: He is alert and oriented to person, place, and time. He has normal strength. No cranial nerve deficit or sensory deficit.  Skin: Skin is warm, dry and intact. No petechiae and no rash noted. He is not diaphoretic. No erythema. No pallor.  Psychiatric: He has a normal mood and affect. His behavior is normal. Judgment normal.  Nursing note and vitals reviewed.   ED Course  Procedures  DIAGNOSTIC STUDIES:  Oxygen Saturation is 100% on RA, normal by my interpretation.    COORDINATION OF CARE:  12:01 AM will order UA. Discussed treatment plan with pt at bedside and pt agreed to plan.  Labs Review Labs Reviewed  LIPASE, BLOOD - Abnormal; Notable for the following:    Lipase 55 (*)    All other components within normal limits  COMPREHENSIVE METABOLIC PANEL - Abnormal; Notable for the following:    Sodium 134 (*)    Glucose, Bld 122 (*)    Creatinine, Ser 1.36 (*)    Albumin 2.7 (*)    AST 144 (*)    ALT 387 (*)    Alkaline Phosphatase 231 (*)    Total Bilirubin 4.4 (*)    GFR calc non Af Amer 52 (*)     Anion gap 4 (*)    All other components within normal limits  CBC - Abnormal; Notable for the following:    WBC 3.9 (*)    Hemoglobin 11.8 (*)    HCT 35.4 (*)    MCV 64.6 (*)    MCH 21.5 (*)    Platelets 118 (*)    All other components within normal limits  URINALYSIS, ROUTINE W REFLEX MICROSCOPIC (NOT AT [facility]) - Abnormal; Notable for the following:    Color, Urine ORANGE (*)    Hgb urine dipstick TRACE (*)    Bilirubin Urine LARGE (*)    Leukocytes, UA TRACE (*)    All other components within normal limits  URINE MICROSCOPIC-ADD ON - Abnormal; Notable for the following:    Squamous Epithelial / LPF 0-5 (*)  Bacteria, UA RARE (*)    All other components within normal limits  PARASITE EXAM SCREEN, BLOOD-W CONF TO [facility] (NOT @ [facility])  SAVE SMEAR  CBC WITH DIFFERENTIAL/PLATELET    Imaging Review No results found. I have personally reviewed and evaluated these images and lab results as part of my medical decision-making.   EKG Interpretation None      MDM   Final diagnoses:  None    Patient presents to the ED for worsening abdominal pain, dark urine, jaundice, and fever.  Although he returned from [place] Aug [id number], he certainly could have a dormant form of malaria.  He states he also took the appropriate doxycycline ppx.  I have sent malaria smears and spoke with fam med for admission.  Home BP meds given tonight.  I personally performed the services described in this documentation, which was scribed in my presence. The recorded information has been reviewed and is accurate.     [name], MD 05/03/16 [phone]

## 2016-05-03 ENCOUNTER — Encounter (HOSPITAL_COMMUNITY): Payer: Self-pay | Admitting: Physician Assistant

## 2016-05-03 ENCOUNTER — Telehealth: Payer: Self-pay

## 2016-05-03 ENCOUNTER — Observation Stay (HOSPITAL_COMMUNITY): Payer: BC Managed Care – PPO

## 2016-05-03 DIAGNOSIS — R509 Fever, unspecified: Secondary | ICD-10-CM | POA: Diagnosis not present

## 2016-05-03 DIAGNOSIS — R7989 Other specified abnormal findings of blood chemistry: Secondary | ICD-10-CM | POA: Diagnosis not present

## 2016-05-03 DIAGNOSIS — R1011 Right upper quadrant pain: Secondary | ICD-10-CM | POA: Diagnosis not present

## 2016-05-03 DIAGNOSIS — R945 Abnormal results of liver function studies: Secondary | ICD-10-CM

## 2016-05-03 DIAGNOSIS — D61818 Other pancytopenia: Secondary | ICD-10-CM

## 2016-05-03 DIAGNOSIS — R740 Nonspecific elevation of levels of transaminase and lactic acid dehydrogenase [LDH]: Secondary | ICD-10-CM | POA: Diagnosis not present

## 2016-05-03 DIAGNOSIS — R17 Unspecified jaundice: Secondary | ICD-10-CM | POA: Diagnosis not present

## 2016-05-03 DIAGNOSIS — K838 Other specified diseases of biliary tract: Secondary | ICD-10-CM

## 2016-05-03 DIAGNOSIS — R1084 Generalized abdominal pain: Secondary | ICD-10-CM

## 2016-05-03 LAB — CBC WITH DIFFERENTIAL/PLATELET
BASOS PCT: 0 %
Basophils Absolute: 0 10*3/uL (ref 0.0–0.1)
EOS PCT: 1 %
Eosinophils Absolute: 0 10*3/uL (ref 0.0–0.7)
HEMATOCRIT: 32 % — AB (ref 39.0–52.0)
Hemoglobin: 11 g/dL — ABNORMAL LOW (ref 13.0–17.0)
LYMPHS ABS: 0.7 10*3/uL (ref 0.7–4.0)
Lymphocytes Relative: 21 %
MCH: 22 pg — AB (ref 26.0–34.0)
MCHC: 34.4 g/dL (ref 30.0–36.0)
MCV: 64.1 fL — AB (ref 78.0–100.0)
MONO ABS: 0.4 10*3/uL (ref 0.1–1.0)
MONOS PCT: 11 %
Neutro Abs: 2.4 10*3/uL (ref 1.7–7.7)
Neutrophils Relative %: 67 %
Platelets: 106 10*3/uL — ABNORMAL LOW (ref 150–400)
RBC: 4.99 MIL/uL (ref 4.22–5.81)
RDW: 14.4 % (ref 11.5–15.5)
WBC: 3.5 10*3/uL — AB (ref 4.0–10.5)

## 2016-05-03 LAB — URINALYSIS, ROUTINE W REFLEX MICROSCOPIC
GLUCOSE, UA: NEGATIVE mg/dL
Ketones, ur: NEGATIVE mg/dL
Nitrite: NEGATIVE
PROTEIN: NEGATIVE mg/dL
Specific Gravity, Urine: 1.018 (ref 1.005–1.030)
pH: 7 (ref 5.0–8.0)

## 2016-05-03 LAB — URINE MICROSCOPIC-ADD ON

## 2016-05-03 LAB — SAVE SMEAR

## 2016-05-03 LAB — PARASITE EXAM SCREEN, BLOOD-W CONF TO LABCORP (NOT @ ARMC)

## 2016-05-03 LAB — BILIRUBIN, FRACTIONATED(TOT/DIR/INDIR)
BILIRUBIN DIRECT: 2.4 mg/dL — AB (ref 0.1–0.5)
Indirect Bilirubin: 1.4 mg/dL — ABNORMAL HIGH (ref 0.3–0.9)
Total Bilirubin: 3.8 mg/dL — ABNORMAL HIGH (ref 0.3–1.2)

## 2016-05-03 LAB — HEMOGLOBIN A1C
HEMOGLOBIN A1C: 6 % — AB (ref 4.8–5.6)
Mean Plasma Glucose: 126 mg/dL

## 2016-05-03 LAB — CREATININE, SERUM
Creatinine, Ser: 1.23 mg/dL (ref 0.61–1.24)
GFR calc non Af Amer: 59 mL/min — ABNORMAL LOW (ref >60)

## 2016-05-03 LAB — ANTINUCLEAR ANTIBODIES, IFA: ANA Ab, IFA: NEGATIVE

## 2016-05-03 LAB — ANTI-MICROSOMAL ANTIBODY LIVER / KIDNEY: LKM1 AB: 2.2 U (ref 0.0–20.0)

## 2016-05-03 LAB — ANTI-SMOOTH MUSCLE ANTIBODY, IGG: F-Actin IgG: 22 Units — ABNORMAL HIGH (ref 0–19)

## 2016-05-03 MED ORDER — SODIUM CHLORIDE 0.9 % IV SOLN: 250.0000 mL | INTRAVENOUS | Status: DC | PRN

## 2016-05-03 MED ORDER — DILTIAZEM HCL ER BEADS 180 MG PO CP24: 180.0000 mg | ORAL_CAPSULE | Freq: Every day | ORAL | Status: DC

## 2016-05-03 MED ORDER — ALFUZOSIN HCL ER 10 MG PO TB24
10.0000 mg | ORAL_TABLET | Freq: Every day | ORAL | Status: DC
  Filled 2016-05-03: qty 1

## 2016-05-03 MED ORDER — LOSARTAN POTASSIUM 50 MG PO TABS
100.0000 mg | ORAL_TABLET | Freq: Once | ORAL | Status: DC
  Filled 2016-05-03: qty 2

## 2016-05-03 MED ORDER — SODIUM CHLORIDE 0.9 % IV SOLN
INTRAVENOUS | Status: DC
  Administered 2016-05-03 – 2016-05-04 (×2): via INTRAVENOUS

## 2016-05-03 MED ORDER — IBUPROFEN 400 MG PO TABS
600.0000 mg | ORAL_TABLET | Freq: Once | ORAL | Status: AC
  Administered 2016-05-03: 600 mg via ORAL
  Filled 2016-05-03: qty 1

## 2016-05-03 MED ORDER — POLYETHYLENE GLYCOL 3350 17 G PO PACK: 17.0000 g | PACK | Freq: Every day | ORAL | Status: DC | PRN

## 2016-05-03 MED ORDER — SODIUM CHLORIDE 0.9% FLUSH: 3.0000 mL | INTRAVENOUS | Status: DC | PRN

## 2016-05-03 MED ORDER — ALFUZOSIN HCL ER 10 MG PO TB24
10.0000 mg | ORAL_TABLET | Freq: Every day | ORAL | Status: DC
  Administered 2016-05-03: 10 mg via ORAL
  Filled 2016-05-03: qty 1

## 2016-05-03 MED ORDER — HYDROCHLOROTHIAZIDE 12.5 MG PO CAPS
12.5000 mg | ORAL_CAPSULE | Freq: Every day | ORAL | Status: DC
  Administered 2016-05-03 – 2016-05-05 (×3): 12.5 mg via ORAL
  Filled 2016-05-03 (×3): qty 1

## 2016-05-03 MED ORDER — LOSARTAN POTASSIUM-HCTZ 100-12.5 MG PO TABS: 1.0000 | ORAL_TABLET | Freq: Every day | ORAL | Status: DC

## 2016-05-03 MED ORDER — ALFUZOSIN HCL ER 10 MG PO TB24
10.0000 mg | ORAL_TABLET | Freq: Every day | ORAL | Status: DC
  Administered 2016-05-03 – 2016-05-04 (×2): 10 mg via ORAL
  Filled 2016-05-03 (×2): qty 1

## 2016-05-03 MED ORDER — DILTIAZEM HCL ER COATED BEADS 180 MG PO CP24
180.0000 mg | ORAL_CAPSULE | Freq: Once | ORAL | Status: AC
  Administered 2016-05-03: 180 mg via ORAL
  Filled 2016-05-03: qty 1

## 2016-05-03 MED ORDER — PANTOPRAZOLE SODIUM 20 MG PO TBEC
20.0000 mg | DELAYED_RELEASE_TABLET | Freq: Every day | ORAL | Status: DC
  Administered 2016-05-03 – 2016-05-05 (×2): 20 mg via ORAL
  Filled 2016-05-03 (×3): qty 1

## 2016-05-03 MED ORDER — DILTIAZEM HCL ER COATED BEADS 180 MG PO CP24
180.0000 mg | ORAL_CAPSULE | Freq: Every day | ORAL | Status: DC
  Administered 2016-05-03 – 2016-05-04 (×2): 180 mg via ORAL
  Filled 2016-05-03 (×4): qty 1

## 2016-05-03 MED ORDER — DOCUSATE SODIUM 100 MG PO CAPS: 100.0000 mg | ORAL_CAPSULE | Freq: Two times a day (BID) | ORAL | Status: DC | PRN

## 2016-05-03 MED ORDER — SODIUM CHLORIDE 0.9% FLUSH
3.0000 mL | Freq: Two times a day (BID) | INTRAVENOUS | Status: DC
  Administered 2016-05-03 – 2016-05-04 (×3): 3 mL via INTRAVENOUS

## 2016-05-03 MED ORDER — FLEET ENEMA 7-19 GM/118ML RE ENEM: 1.0000 | ENEMA | Freq: Once | RECTAL | Status: DC | PRN

## 2016-05-03 MED ORDER — PIPERACILLIN-TAZOBACTAM 3.375 G IVPB
3.3750 g | Freq: Three times a day (TID) | INTRAVENOUS | Status: DC
Start: 2016-05-03 — End: 2016-05-04
  Administered 2016-05-03 – 2016-05-04 (×3): 3.375 g via INTRAVENOUS
  Filled 2016-05-03 (×4): qty 50

## 2016-05-03 MED ORDER — LOSARTAN POTASSIUM 50 MG PO TABS
100.0000 mg | ORAL_TABLET | Freq: Every day | ORAL | Status: DC
  Administered 2016-05-03 – 2016-05-05 (×3): 100 mg via ORAL
  Filled 2016-05-03 (×3): qty 2

## 2016-05-03 MED ORDER — POLYETHYLENE GLYCOL 3350 17 G PO PACK
17.0000 g | PACK | Freq: Two times a day (BID) | ORAL | Status: DC
  Administered 2016-05-03 – 2016-05-05 (×4): 17 g via ORAL
  Filled 2016-05-03 (×5): qty 1

## 2016-05-03 MED ORDER — POLYETHYLENE GLYCOL 3350 17 G PO PACK
17.0000 g | PACK | Freq: Every day | ORAL | Status: DC
  Administered 2016-05-03: 17 g via ORAL

## 2016-05-03 MED ORDER — HYDROCHLOROTHIAZIDE 12.5 MG PO CAPS: 12.5000 mg | ORAL_CAPSULE | Freq: Once | ORAL | Status: DC

## 2016-05-03 MED ORDER — ENOXAPARIN SODIUM 40 MG/0.4ML ~~LOC~~ SOLN
40.0000 mg | SUBCUTANEOUS | Status: DC
  Administered 2016-05-03 – 2016-05-05 (×3): 40 mg via SUBCUTANEOUS
  Filled 2016-05-03 (×3): qty 0.4

## 2016-05-03 MED ORDER — GADOBENATE DIMEGLUMINE 529 MG/ML IV SOLN
20.0000 mL | Freq: Once | INTRAVENOUS | Status: AC | PRN
  Administered 2016-05-03: 20 mL via INTRAVENOUS

## 2016-05-03 MED ORDER — IBUPROFEN 200 MG PO TABS
400.0000 mg | ORAL_TABLET | Freq: Four times a day (QID) | ORAL | Status: DC | PRN
  Administered 2016-05-03: 400 mg via ORAL
  Filled 2016-05-03: qty 2

## 2016-05-03 NOTE — ED Notes (Signed)
Admitting at bedside 

## 2016-05-03 NOTE — Telephone Encounter (Signed)
I called and left a message that I was following his course in the hospital.

## 2016-05-03 NOTE — Telephone Encounter (Signed)
Msg is for Dr. Everlene Farrier, Pts daughter was returning his call to update him.  Please advise  [phone]

## 2016-05-03 NOTE — Telephone Encounter (Signed)
This is a duplicate phone note.  

## 2016-05-03 NOTE — Consult Note (Addendum)
Consultation  Referring Provider:   Dr. Erin Hearing   Primary Care Physician:  Kennon Portela, MD Primary Gastroenterologist:  Dr. Sharlett Iles       Reason for Consultation:  Abdominal Pain       Impression / Plan:  Impression: 1. Generalized abd pain: 5d of generalized abdominal pain, initial concern for gallbladder etiology in the setting of elevated LFTs, these have started to trend downard. Consider gallbladder etiology vs infectious cause vs other 2. Elevated LFT's: Acetaminophen level normal, hep panle neg, HIV neg- consider alk phos in relation to paget's disease vs autoimmune cause vs other 3. Constipation: No results with Senokot, miralax or stool softeners. Recommend BID scheduled MIralax  Plan: 1. Would recommend MRCP for further eval 2. Will order CMV PCR, ASMA , Monospt, PT/INR, repeat LFT 3. Miralax BID scheduled 4. Discussed above with Dr. Carlean Purl, please await any further recs  Thank you for your kind consultation, we will continue to follow.  Jeffrey Perry  05/03/2016, 3:00 PM    [facility] Attending   I have taken an interval history, reviewed the chart and examined the patient. I agree with the Advanced Practitioner's note, impression and recommendations.   It sounds like he has some sort of infectious process but could be malignancy or autoimmune. Apparently uroxatral can cause hepatptoxicity - have never seen that.  W/U as above   Will f/u tomorrow  Gatha Mayer, MD, [facility] [phone] (pager) [phone] after 5 PM, weekends and holidays  05/03/2016 3:47 PM            HPI:   Jeffrey Perry is a 70 y.o. African American male with a past medical history of Paget's disease, prostate cancer, hypertension and renal insufficiency, who initially presented to the ED on 04/29/16 for sudden onset of midabdominal pain after eating [employer]. He did have a full workup at that time including a CT and ultrasound for suspected  gallbladder etiology, though workup was negative. CT showed no acute intra-abdominal pelvic findings, nonspecific gallbladder distention without biliary obstruction and diverticulosis without acute inflammatory process. Ultrasound showed mild gallbladder distention but no gallstones or pericholecystic inflammation. The patient did have elevated liver enzymes with an alkaline phosphatase at 233, AST 239 and ALT  126 as well as bilirubin of 1.7. The patient was discharged home following improvement of his abdominal pain while in the ED.  The patient presented to his primary care provider on 04/30/16 and was found to have improved abdominal pain though there was concern regarding the fact that he had continued transaminitis in the setting of continued abdominal pain and the patient was admitted to [facility]. Patient remained afebrile throughout hospitalization and tolerated a clear liquid diet by time of discharge. CMP at time of that admission showed worsening transaminitis with alkaline phosphatase at 250, AST of 660, ALT 941 and total bilirubin of 4.7. Surgical consult was considered but not obtained given mild abdominal pain on exam without acute surgical abdomen present. Hepatitis panel was obtained and normal. Acetaminophen level was negative and a tox panel was negative. Patient's abdominal pain continued to improve. On day of discharge patient's alkaline phosphatase had decreased to 22, AST of 359, ALT 673 as well as a total bilirubin of 5. It was suspected patient had passed a gallstone vs some infectious cause.  Most recently the patient re-presented to the ER on 05/02/2016 for constant abdominal pain and worsening cough associated with chills, subjective fever and constipation.  Today,  the patient's daughter is by his bedside and assists with his history. She tells me that over the past day or so since he has been home his urine has remained very "deep and dark", he has been unable to eat, has been  lightheaded and has developed chills as well as hot flashes. He has also continued with night sweats. The patient also continues to report abdominal pain which is worse with movement, deep breaths and coughing. Patient has also tried Senokot, MiraLAX and prune juice and has only had one small soft stool as of yesterday morning. He does report that he has had night sweats for over 3 months. His daughter feels it significant that he did "pass out" a week ago. The patient describes that typically he does have constipation according to what he eats, such as red meat or recently rice. Associated symptoms include some nausea.  Patient denies dysphagia, heartburn, reflux, vomiting, diarrhea, hematochezia, melena or use of NSAIDs or any other new medications.    Past Medical History  Diagnosis Date  . Hypertension   . Prostate cancer ([place])   . Chronic kidney disease     Past Surgical History  Procedure Laterality Date  . Hydrocele excision  2012  . Finger ganglion cyst excision  2011    3rd finger right hand    Family History  Problem Relation Age of Onset  . Hypertension Sister   . Hypertension Brother   . Colon cancer Neg Hx   . Hypertension Mother   . Hypertension Brother   . Stroke Sister   . Hypertension Sister   Negative for colorectal cancer, polyps, IBD, breast cancer, ovarian cancer, liver or pancreatic disease  Social History  Substance Use Topics  . Smoking status: Never Smoker   . Smokeless tobacco: Never Used  . Alcohol Use: No    Prior to Admission medications   Medication Sig Start Date End Date Taking? Authorizing Provider  alfuzosin (UROXATRAL) 10 MG 24 hr tablet TAKE 1 TABLET (10 MG TOTAL) BY MOUTH DAILY WITH BREAKFAST Patient taking differently: Take 10 mg by mouth at bedtime.  01/21/16  Yes [name], MD  diltiazem (TIAZAC) 180 MG 24 hr capsule TAKE 1 CAPSULE (240 MG TOTAL) BY MOUTH DAILY. Patient taking differently: Take 180 mg by mouth at bedtime.   01/21/16  Yes [name], MD  ibuprofen (ADVIL,MOTRIN) 200 MG tablet Take 400 mg by mouth every 6 (six) hours as needed (pain).   Yes Historical Provider, MD  losartan-hydrochlorothiazide (HYZAAR) 100-12.5 MG tablet Take 1 tablet by mouth daily. 01/21/16  Yes [name], MD  pantoprazole (PROTONIX) 20 MG tablet Take 1 tablet (20 mg total) by mouth daily. 04/29/16  Yes Chesley Noon Nadeau, PA-C  senna-docusate (SENOKOT-S) 8.6-50 MG tablet Take 1 tablet by mouth at bedtime as needed for mild constipation. 05/01/16  Yes [place], DO    Current Facility-Administered Medications  Medication Dose Route Frequency Provider Last Rate Last Dose  . 0.9 %  sodium chloride infusion  250 mL Intravenous PRN Rogue Bussing, MD      . 0.9 %  sodium chloride infusion   Intravenous Continuous Katheren Shams, DO      . alfuzosin (UROXATRAL) 24 hr tablet 10 mg  10 mg Oral QHS Rogue Bussing, MD      . diltiazem (CARDIZEM CD) 24 hr capsule 180 mg  180 mg Oral Daily Lind Covert, MD   180 mg at 05/03/16 0958  .  docusate sodium (COLACE) capsule 100 mg  100 mg Oral BID PRN Katheren Shams, DO      . enoxaparin (LOVENOX) injection 40 mg  40 mg Subcutaneous Q24H Rogue Bussing, MD   40 mg at 05/03/16 1004  . losartan (COZAAR) tablet 100 mg  100 mg Oral Daily Lind Covert, MD   100 mg at 05/03/16 0957   And  . hydrochlorothiazide (MICROZIDE) capsule 12.5 mg  12.5 mg Oral Daily Lind Covert, MD   12.5 mg at 05/03/16 0958  . ibuprofen (ADVIL,MOTRIN) tablet 400 mg  400 mg Oral Q6H PRN Rogue Bussing, MD   400 mg at 05/03/16 0957  . pantoprazole (PROTONIX) EC tablet 20 mg  20 mg Oral Daily Rogue Bussing, MD   20 mg at 05/03/16 0959  . polyethylene glycol (MIRALAX / GLYCOLAX) packet 17 g  17 g Oral Daily Katheren Shams, DO      . sodium chloride flush (NS) 0.9 % injection 3 mL  3 mL Intravenous Q12H Rogue Bussing, MD   3 mL at  05/03/16 0245  . sodium chloride flush (NS) 0.9 % injection 3 mL  3 mL Intravenous PRN Rogue Bussing, MD      . sodium phosphate (FLEET) 7-19 GM/118ML enema 1 enema  1 enema Rectal Once PRN Rogue Bussing, MD        Allergies as of 05/02/2016 - Review Complete 05/02/2016  Allergen Reaction Noted  . Latex Itching 04/30/2016  . Lisinopril Cough 09/07/2011  . Neuromuscular blocking agents Swelling 03/06/2012  . Pyridium [phenazopyridine hcl] Swelling 03/06/2012  . Sulfonamide derivatives Itching 09/07/2011     Review of Systems:    Constitutional: Positive fever and chills and night sweats No weight loss or fatigue HEENT: Eyes: Positive yellow sclerae No visual loss, blurred vision, double vision                Ears, Nose, Throat:  No hearing loss, sneezing. Congestion, runny nose or                      sore throat Skin: No rash or itching Cardiovascular: No chest pain, chest pressure or chest discomfort. No palpitations or                                edema Respiratory: No SOB, cough or sputum Gastrointestinal: See HPI and otherwise negative Genitourinary: Positive for dark urine No dysuria or change in urinary frequency Neurological: No headache, dizziness, syncope, numbness or tingling in the extremities Musculoskeletal: No muscle, back pain, joint pain or stiffness. Hematologic: No anemia, bleeding or bruising Lymphatics: No enlarged lymph nodes or history of splenectomy Psychiatric: No history of depression or anxiety Endocrinologic: Positive for night sweats No reports ofcold or heat intolerance, poyluria or polydypsia Allergies: No history of asthma, hives or eczema    Physical Exam:  Vital signs in last 24 hours: Temp:  [98.6 F (37 C)-100.6 F (38.1 C)] 98.6 F (37 C) (06/05 1309) Pulse Rate:  [52-66] 55 (06/05 1309) Resp:  [16-18] 18 (06/05 1309) BP: (142-176)/(71-88) 142/78 mmHg (06/05 1309) SpO2:  [98 %-100 %] 100 % (06/05 1309) Weight:  [222  lb (100.699 kg)] 222 lb (100.699 kg) (06/04 2029) Last BM Date: 05/01/16 General:   Pleasant African American male appears to be in NAD, Well developed, Well nourished, alert and cooperative Head:  Normocephalic  and atraumatic. Eyes:   PEERL, EOMI. icterus  Ears:  Normal auditory acuity. Neck:  Supple Throat: Oral cavity and pharynx without inflammation, swelling or lesion. Teeth in good condition. Lungs: Respirations even and unlabored. Lungs clear to auscultation bilaterally.   No wheezes, crackles, or rhonchi.  Heart: Normal S1, S2. No MRG. Regular rate and rhythm. No peripheral edema, cyanosis or pallor.  Abdomen:  Soft, nondistended, mild generalized ttp in all four quadrants, worse in the RUQ/epigastrum,  No rebound or guarding. Normal bowel sounds. No appreciable masses. No abdominal distension.  Rectal:  Not performed.  Msk:  Symmetrical without gross deformities. Peripheral pulses intact.  Extremities:  Without edema, no deformity or joint abnormality. Normal ROM, normal sensation. Neurologic:  Alert and  oriented x4;  grossly normal neurologically. Skin:   Dry and intact without significant lesions or rashes. Psychiatric: Oriented to person, place and time. Demonstrates good judgement and reason without abnormal affect or behaviors.   LAB RESULTS:  Recent Labs  05/01/16 0634 05/02/16 2037 05/03/16 0304  WBC 6.9 3.9* 3.5*  HGB 11.2* 11.8* 11.0*  HCT 34.3* 35.4* 32.0*  PLT 112* 118* 106*   BMET  Recent Labs  04/30/16 1830 05/01/16 0634 05/02/16 2037 05/03/16 0304  NA 137 137 134*  --   K 3.9 3.4* 3.8  --   CL 105 105 104  --   CO2 25 22 26   --   GLUCOSE 91 75 122*  --   BUN 16 13 8   --   CREATININE 1.74* 1.51* 1.36* 1.23  CALCIUM 8.4* 8.3* 9.1  --    LFT  Recent Labs  05/02/16 2037 05/03/16 0537  PROT 6.8  --   ALBUMIN 2.7*  --   AST 144*  --   ALT 387*  --   ALKPHOS 231*  --   BILITOT 4.4* 3.8*  BILIDIR  --  2.4*  IBILI  --  1.4*      Imaging: CT ABDOMEN AND PELVIS WITH CONTRAST 04/29/16  TECHNIQUE: Multidetector CT imaging of the abdomen and pelvis was performed using the standard protocol following bolus administration of intravenous contrast.  CONTRAST: 100 ISOVUE-300 IOPAMIDOL (ISOVUE-300) INJECTION 61%  COMPARISON: 10/10/2015  FINDINGS: Lower chest: Minor dependent bibasilar atelectasis. Mildly prominent heart size. No pericardial or pleural effusion.  Hepatobiliary: No focal hepatic abnormality or biliary dilatation. Patent hepatic and portal veins. Mild distention of the gallbladder, nonspecific. No biliary dilatation.  Pancreas: No mass, inflammatory changes, or other significant abnormality.  Spleen: Within normal limits in size and appearance.  Adrenals/Urinary Tract: No masses identified. No evidence of hydronephrosis.  Stomach/Bowel: Negative for bowel obstruction, significant dilatation, ileus, or free air. Normal appendix demonstrated. Colonic diverticulosis, most pronounced in the descending colon and sigmoid in the left lower quadrant. No acute inflammatory process. Incidental duodenum diverticulum in the midline as before, image 20.  Vascular/Lymphatic: No adenopathy. Aortic atherosclerosis evident without aneurysm. No retroperitoneal abnormality or hemorrhage.  Reproductive: Mild prostate enlargement. Radiopaque markers at the prostate base. Seminal vesicles unremarkable.  Other: No inguinal abnormality or hernia. Abdominal wall demonstrates a stable small umbilical hernia with a loop of small bowel but no obstruction or incarceration.  Musculoskeletal: Degenerative changes present of the spine. Extensive thickening, sclerosis and trabeculation of the bony pelvis and sacrum without significant change compatible with Paget's disease. However, Chronic treated bony metastases from known prostate cancer could have a similar appearance.  IMPRESSION: No acute  intra-abdominal or pelvic finding by CT.  Nonspecific gallbladder distension without  biliary obstruction.  Stable chronic duodenal diverticulum and stable small umbilical hernia.  Atherosclerosis of the aorta without aneurysm  Diverticulosis without acute inflammatory process  Chronic pelvic and sacral sclerosis, thickening and trabeculation compatible with chronic Paget's disease versus treated metastases.   Electronically Signed  By: Jerilynn Mages. Shick M.D.  On: 04/29/2016 21:16  ABDOMEN ULTRASOUND COMPLETE 04/29/16  COMPARISON: CT earlier this day  FINDINGS: Gallbladder: Distended. Wall thickness of 3 mm. No gallstones visualized. No sonographic Murphy sign noted by sonographer.  Common bile duct: Diameter: 2 mm, normal.  Liver: No focal lesion identified. Within normal limits in parenchymal echogenicity. Normal directional flow in the main portal vein.  IVC: No abnormality visualized.  Pancreas: Visualized portion unremarkable, not well visualized, majority is obscured.  Spleen: Size and appearance within normal limits.  Right Kidney: Length: 10.4 cm. Echogenicity within normal limits. No mass or hydronephrosis visualized.  Left Kidney: Length: 12.1 Cm. Echogenicity within normal limits. No mass or hydronephrosis visualized.  Abdominal aorta: No aneurysm visualized proximally. Mid and distal abdominal aorta are obscured by bowel gas.  Other findings: None.  IMPRESSION: 1. Mild gallbladder distention but no gallstones or pericholecystic inflammation. This is of doubtful clinical significance. 2. Otherwise unremarkable abdominal ultrasound.   Electronically Signed  By: Jeb Levering M.D.  On: 04/29/2016 22:58  EXAM: CHEST 1 VIEW 04/30/16  COMPARISON: Chest radiograph performed 03/06/2012  FINDINGS: The lungs are well-aerated. Vascular congestion is noted. Mild left basilar opacity may reflect atelectasis or possibly mild  pneumonia. There is no evidence of pleural effusion or pneumothorax.  The cardiomediastinal silhouette is mildly enlarged. No acute osseous abnormalities are seen.  IMPRESSION: Vascular congestion and mild cardiomegaly. Mild left basilar opacity may reflect atelectasis or possibly mild pneumonia.   Electronically Signed  By: Garald Balding M.D.  On: 04/30/2016 21:26  PREVIOUS ENDOSCOPIES:            Colonoscopy 04/11/13- Dr. Sharlett Iles- mild diverticulosis descending and sigmoid colon, no polyps

## 2016-05-03 NOTE — H&P (Signed)
[facility] Admission History and Physical Service Pager: [phone]  Patient name: Jeffrey Perry Medical record number: [id number] Date of birth: Sep 05, 1946 Age: 70 y.o. Gender: male  Primary Care Provider: Kennon Portela, MD Consultants: None Code Status: Full  Chief Complaint: Worsening abdominal pain  Assessment and Plan: Jeffrey Perry is a 70 y.o. male presenting with diffuse abdominal pain. PMH is significant for Prostate Cancer s/p IMRT, HTN, and Renal Insufficiency.  # Abdominal Pain: Pain is diffuse and hepatosplenomegaly is appreciated. Patient also with fever to 100.6 F. No new imaging performed. Last admission, CT abdomen and pelvis showed no acute intra-abdominal or pelvic findings, nonspecific gallbladder distension without biliary obstruction, and diverticulosis without acute inflammatory process. Last [place] with mild gallbladder distention but no gallstones or pericholecystic inflammation. WBC 3.9 on admission, decreased from mildly elevated WBC of 10.7 a few of days ago, diff pending. CMP from 6/4 compared to 6/2 improved with Alkaline Phosphatase (231<250), AST (144<660), ALT (387<941), and Total Bilirubin (4.4<4.7). Urinalysis with large bilirubin. Last colonoscopy in 2014 with diverticulosis, next due in 2024. Concern for infectious etiology, especially malaria, given travel and hepatosplenomegaly. Alternative parasitic infections (e.g. dengue, chikungunya, leptospirosis) also considerations. If fever continues, consider ID consult. Acute cholangitis also possible given abd pain, fever, jaundice, could consider empiric coverage of gram negative and anaerobes with unasyn or zosyn.  - Admit to med-surg, Dr. Erin Hearing attending. - Monitor fever.   - Peripheral smear ordered on blood. - Blood cultures x 2 d/t fever of unknown origin - Urine culture ordered - Advance diet as tolerated. - Ibuprofen PRN Fever/Moderate Pain. Consider additional pain medication  if needed.  - Miralax, fleets enema PRN mild, severe constipation - Continue home Protonix - Consider GI consult  #Hyperbilirubinemia: Unsure whether due to hemolysis (with anemia) or cholestasis (with concomitant LFT/alk phos elevation).  - Obtain fractionated bilirubin - Could consider HIDA scan if blood smear negative for malaria.   # Anemia: Hemoglobin 11. (Baseline ~13) Concern for hemolysis with hepatosplenomegaly. - Monitor on CBC - Obtain haptoglobin  # Hypertension: Elevated with systolic values in [id number]. Possibly due to discomfort. Home medications include Diltiazem 164m daily and Losartan/HCTZ 100/12.589m - Continue home medication. - Consider IV hydralazine prn if blood pressures remain elevated.   # H/O Prostate Cancer: Home medications include Alfuzosin. Last PSA normal in 12/2015. - Continue home medication  # Night Sweats: Chronic. History of travel to [place] June 2016 but reports ppx with doxycycline.  - TSH and HIV wnl at last admission. - CXR 6/2 with mild left basilar opacity, atelectasis vs. mild PNA - Consider PPD as outpatient.   # Hyperglycemia: Normal on admission at 89. Previously elevated to 141 on 6/1. - A1C pending from previous admission - Continue to monitor Glucose  FEN/GI: Heart Healthy Diet, Advance as tolerated Prophylaxis: Lovenox  Disposition: Admitted to med-surg, Attending Dr. ChErin Hearingfor further work-up.   History of Present Illness:  Jeffrey Perry a [age].o. male presenting with abdominal pain. It began 4 days ago after eating a [place] [employer] was mostly located in his LLQ. Now abdominal pain is "all over." He rates it as a 7/10. He was recently hospitalized for this issue and was found to have mild gallbladder distention on abdominal CT and [place] 6/3 with suspected transaminitis due to possible passed gallstone, with labs improving, with constipation thought to be contributing to pain. Since discharge, pain  has become more diffuse. He continues  to feel that he has a fever and has developed chills. He notes his urine continues to be dark despite drinking lots of fluids, for which his daughter also expresses concern. He reports his abdominal pain is worse with coughing, deep inspiration, and standing. He has noticed his palms are still yellow. He expresses concern about malaria, as he has had before, and traveled to [place] August 2016, though he took doxycycline prophylaxis daily and for 6 weeks after he returned. Senna has not helped his constipation. He used miralax yesterday and had a small BM this a.m. Advil helps somewhat.   Hepatitis labs were negative at last visit, as was HIV. Autoimmune hepatitis labs are still pending.   Review Of Systems: Per HPI with the following additions: He denies dysuria. Long-standing night sweats (for "forever," years). No SOB. No myalgias. Otherwise the remainder of the systems were negative.  Patient Active Problem List   Diagnosis Date Noted  . Abdominal pain 04/30/2016  . Renal insufficiency 03/05/2013  . HTN (hypertension) 03/06/2012  . Prostate cancer ([place]) 10/04/2011    Past Medical History: Past Medical History  Diagnosis Date  . Hypertension   . Prostate cancer ([place])   . Chronic kidney disease     Past Surgical History: Past Surgical History  Procedure Laterality Date  . Hydrocele excision  2012  . Finger ganglion cyst excision  2011    3rd finger right hand    Social History: Social History  Substance Use Topics  . Smoking status: Never Smoker   . Smokeless tobacco: Never Used  . Alcohol Use: No   Additional social history: Lives with wife. Has a beer every few months.  Please also refer to relevant sections of EMR.  Family History: Family History  Problem Relation Age of Onset  . Hypertension Sister   . Hypertension Brother   . Colon cancer Neg Hx   . Hypertension Mother   . Hypertension Brother   . Stroke Sister   .  Hypertension Sister    Allergies and Medications: Allergies  Allergen Reactions  . Latex Itching  . Lisinopril Cough  . Neuromuscular Blocking Agents Swelling    Ankle swelling  . Pyridium [Phenazopyridine Hcl] Swelling    Ankle swelling  . Sulfonamide Derivatives Itching   No current facility-administered medications on file prior to encounter.   Current Outpatient Prescriptions on File Prior to Encounter  Medication Sig Dispense Refill  . alfuzosin (UROXATRAL) 10 MG 24 hr tablet TAKE 1 TABLET (10 MG TOTAL) BY MOUTH DAILY WITH BREAKFAST (Patient taking differently: Take 10 mg by mouth at bedtime. ) 90 tablet 1  . diltiazem (TIAZAC) 180 MG 24 hr capsule TAKE 1 CAPSULE (240 MG TOTAL) BY MOUTH DAILY. (Patient taking differently: Take 180 mg by mouth at bedtime. ) 90 capsule 1  . ibuprofen (ADVIL,MOTRIN) 200 MG tablet Take 400 mg by mouth every 6 (six) hours as needed (pain).    [name] Kitchen losartan-hydrochlorothiazide (HYZAAR) 100-12.5 MG tablet Take 1 tablet by mouth daily. 90 tablet 1  . pantoprazole (PROTONIX) 20 MG tablet Take 1 tablet (20 mg total) by mouth daily. 30 tablet 0  . senna-docusate (SENOKOT-S) 8.6-50 MG tablet Take 1 tablet by mouth at bedtime as needed for mild constipation. 30 tablet 0    Objective: BP 150/74 mmHg  Pulse 57  Temp(Src) 100.6 F (38.1 C) (Oral)  Resp 16  Ht 6' 1"  (1.854 m)  Wt 222 lb (100.699 kg)  BMI 29.30 kg/m2  SpO2 98% Exam: General:  Pleasant male, resting in bed in NAD Eyes: Muddy sclerae with scleral icterus present. EOMI. PERRLA. ENTM: Jaundice of buccal mucosa. MMM.  Neck: Supple. No cervical lymphadenopathy. Cardiovascular: RRR, S1, S2. No m/r/g. Respiratory: CTAB. Somewhat shallow breaths but no increased WOB. Abdomen: ++BS, hyper-resonant to percussion, TTP diffusely without rebound or guarding, hepatosplenomegaly present MSK: Trace pitting edema of shins. Can move all extremities spontaneously. Skin: Warm to touch. Pale conjunctival  mucosa.  Neuro: AOx3. No focal deficits. Psych: Normal mood and affect.   Labs and Imaging: CBC BMET   Recent Labs Lab 05/02/16 2037  WBC 3.9*  HGB 11.8*  HCT 35.4*  PLT 118*    Recent Labs Lab 05/02/16 2037  NA 134*  K 3.8  CL 104  CO2 26  BUN 8  CREATININE 1.36*  GLUCOSE 122*  CALCIUM 9.1      Rogue Bussing, MD 05/03/2016, 12:43 AM PGY-1, [place] [facility] pager: [phone], text pages welcome  I have seen and evaluated the patient with Dr. Ola Spurr. I am in agreement with the note above in its revised form. My additions are in red.  Shivaun Bilello B. Perry Puna, MD, PGY-3 05/03/2016 5:25 AM

## 2016-05-03 NOTE — Consult Note (Signed)
[facility]       Reason for Consult: fever, ? malaria    Referring Physician: Dr. Erin Hearing  Active Problems:   Abdominal pain   Jaundice   . alfuzosin  10 mg Oral QHS  . diltiazem  180 mg Oral Daily  . enoxaparin (LOVENOX) injection  40 mg Subcutaneous Q24H  . losartan  100 mg Oral Daily   And  . hydrochlorothiazide  12.5 mg Oral Daily  . pantoprazole  20 mg Oral Daily  . polyethylene glycol  17 g Oral Daily  . sodium chloride flush  3 mL Intravenous Q12H    Recommendations: Work up for elevated bilirubin, pancytopenia, biliary distension such as ERCP with consideration of acute cholangitis.   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  DDx includes leukemia with pancytopenia, which is new.    Antibiotics: none  HPI: Jeffrey Perry is a 70 y.o. male from [place], last traveled there in August 2016, who comes in with abdominal pain.  Pain is associated with eating, no bowel movement since Friday and he states he is not passing gas.  Has had episodes of rigors where he is using a space heater to keep himself warm, fever and abdominal distention.  He has chronic alk phos elevation and previously evaluated with normal GGT and increased bone component but bilirubin until now has remained negative.  He has now hyperbilirubinemia, elevated LFTs, mild biliary distention and negative CT and ultrasound.  Pancytopenia is new.  Blood cultures ngtd.  Some abdominal pain with movement, lying, sneezing, coughing.  CT independently reviewed and no acute findings.   Review of Systems:  Constitutional: positive for fevers, chills, malaise and anorexia or negative for weight loss Cardiovascular: negative for chest pain Integument/breast: negative for rash Musculoskeletal:  negative for myalgias and arthralgias All other systems reviewed and are negative   Past Medical History  Diagnosis Date  . Hypertension   . Prostate cancer ([place])   . Chronic kidney disease     Social History  Substance Use Topics  . Smoking status: Never Smoker   . Smokeless tobacco: Never Used  . Alcohol Use: No    Family History  Problem Relation Age of Onset  . Hypertension Sister   . Hypertension Brother   . Colon cancer Neg Hx   . Hypertension Mother   . Hypertension Brother   . Stroke Sister   . Hypertension Sister     Allergies  Allergen Reactions  . Latex Itching  . Lisinopril Cough  . Neuromuscular Blocking Agents Swelling    Ankle swelling  . Pyridium [Phenazopyridine Hcl] Swelling    Ankle swelling  . Sulfonamide Derivatives Itching    Physical Exam: Constitutional: in no apparent distress and non-toxic  Filed Vitals:   05/03/16 0200 05/03/16 1309  BP: 153/86 142/78  Pulse: 53 55  Temp:  98.6 F (37 C)  Resp:  18   EYES: slight icterus ENMT: no thrush Cardiovascular: Cor RRR and No murmurs Respiratory: CTA B; normal respiratory effort GI: Bowel sounds are normal, liver is not enlarged, spleen is not enlarged; no rebound, no guarding ;no Murphy's sign.   Musculoskeletal: no pedal edema noted Skin: negatives: no rash Hematologic: no cervical or supraclavicular lad  Lab Results  Component Value Date   WBC 3.5* 05/03/2016  HGB 11.0* 05/03/2016   HCT 32.0* 05/03/2016   MCV 64.1* 05/03/2016   PLT 106* 05/03/2016    Lab Results  Component Value Date   CREATININE 1.23 05/03/2016   BUN 8 05/02/2016   NA 134* 05/02/2016   K 3.8 05/02/2016   CL 104 05/02/2016   CO2 26 05/02/2016    Lab Results  Component Value Date   ALT 387* 05/02/2016   AST 144* 05/02/2016   ALKPHOS 231* 05/02/2016     Microbiology: Recent Results (from the past 240 hour(s))  Culture, blood (routine x 2)     Status: None (Preliminary result)   Collection Time:  05/03/16  5:30 AM  Result Value Ref Range Status   Specimen Description BLOOD LEFT ANTECUBITAL  Final   Special Requests BOTTLES DRAWN AEROBIC AND ANAEROBIC 5CC  Final   Culture NO GROWTH < 12 HOURS  Final   Report Status PENDING  Incomplete  Culture, blood (routine x 2)     Status: None (Preliminary result)   Collection Time: 05/03/16  5:37 AM  Result Value Ref Range Status   Specimen Description BLOOD LEFT ANTECUBITAL  Final   Special Requests BOTTLES DRAWN AEROBIC AND ANAEROBIC 5CC  Final   Culture NO GROWTH < 12 HOURS  Final   Report Status PENDING  Incomplete    Scharlene Gloss, [place] [facility] [email or web address] [phone] pager  [phone] cell 05/03/2016, 2:38 PM

## 2016-05-03 NOTE — Progress Notes (Signed)
Patient ID: Jeffrey Perry, male   DOB: 07/23/1946, 70 y.o.   MRN: [id number] Family Medicine Teaching Service Daily Progress Note Intern Pager: [phone]  Patient name: Jeffrey Perry Medical record number: [id number] Date of birth: September 19, 1946 Age: 70 y.o. Gender: male  Primary Care Provider: Kennon Portela, MD Consultants: ID Code Status: Full code   Pt Overview and Major Events to Date:  6/4 - Readmitted for diffuse abdominal pain  Assessment and Plan: 70yo with PMH of prostate CA, HTN, renal insufficiency presenting with abdominal pain and fever of 100.6.  Abdominal pain: -Unknown etiology but ddx includes malaria vs viral infection vs cholangitis. Last travel to [place] Aug 2016 but received doxy prophylaxis  -GI consult pending -Down trending AST 359->144 ALT 673->387 -Hep panel neg, HIV neg -No recent tylenol intake -Tbili- 3.8 indirect-1.4 direct 2.4  -repeat abd imaging  Constipation: -Last BM 4 days ago -docusate prn and miralax daily -Has PRN fleet enema that has not taken yet.   Fever: -Consult ID  -Blood and urine cultures pending  -UA without bacteria -CXR with mild Left basilar opacity and atelectasis  -smear pending pathologist review  Anemia: Hemoglobin 11. (Baseline ~13) Concern for hemolysis with hepatosplenomegaly. -Follow Hgb with CBC -Obtain haptoglobin  Hypertension: Elevated with sys in 160s.  -Continue home medication Diltiazem 180mg  and Losartan/hctz 100/12.5mg  -Consider IV hydralazine prn if blood pressures remain elevated.   H/O Prostate Cancer: Home medications include Alfuzosin. Last PSA normal in 12/2015. - Continue home medication  Night Sweats: Chronic. History of travel to [place] in June 2016 but had prophylaxis  - TSH and HIV wnl at last admission. - CXR 6/2 with mild left basilar opacity, atelectasis vs. mild PNA - Consider PPD as outpatient.   Hyperglycemia: Normal on admission at 89. Previously elevated to 141 on 6/1. - A1C pending  from previous admission - Continue to monitor Glucose  FEN/GI: Heart Healthy Diet  NS 75cc/hr  PPx: Lovenox  Disposition: Can likely go home pending peripheral smear   Subjective:  Over night no acute problems. Pt temperature remained at 100.6. He report having night sweats but these have been ongoing for longer than duration of admitting illness. He denies weight loss, diarrhea, arthralgias, HA, neck pain, nuchal rigidity.   Objective: Temp:  [100.1 F (37.8 C)-100.6 F (38.1 C)] 100.6 F (38.1 C) (06/04 2357) Pulse Rate:  [52-66] 53 (06/05 0200) Resp:  [16-18] 16 (06/05 0130) BP: (150-176)/(71-88) 153/86 mmHg (06/05 0200) SpO2:  [98 %-100 %] 99 % (06/05 0200) Weight:  [100.699 kg (222 lb)] 100.699 kg (222 lb) (06/04 2029) Physical Exam: General: No acute distress.  Cardiovascular: RRR, no murmurs Respiratory: clear bilaterally, no wheezes, no rales Abdomen: Soft, non-tender palpation. No masses Extremities: No rashes, Moving all extremities  Laboratory:  Recent Labs Lab 05/01/16 0634 05/02/16 2037 05/03/16 0304  WBC 6.9 3.9* 3.5*  HGB 11.2* 11.8* 11.0*  HCT 34.3* 35.4* 32.0*  PLT 112* 118* 106*    Recent Labs Lab 04/30/16 1830 05/01/16 0634 05/02/16 2037 05/03/16 0304 05/03/16 0537  NA 137 137 134*  --   --   K 3.9 3.4* 3.8  --   --   CL 105 105 104  --   --   CO2 25 22 26   --   --   BUN 16 13 8   --   --   CREATININE 1.74* 1.51* 1.36* 1.23  --   CALCIUM 8.4* 8.3* 9.1  --   --   PROT 6.6 5.5*  6.8  --   --   BILITOT 4.7* 5.0* 4.4*  --  3.8*  ALKPHOS 250* 222* 231*  --   --   ALT 941* 673* 387*  --   --   AST 660* 359* 144*  --   --   GLUCOSE 91 75 122*  --   --    Imaging/Diagnostic Tests: None  Bonnetta Barry, Med Student 05/03/2016, 8:53 AM [place] Intern pager: [phone], text pages welcome  RESIDENT ADDENDUM I have separately seen and examined the patient. I have discussed the findings and exam with the medical  student and agree with the above note. I helped develop the management plan that is described in the student's note, and I agree with the content. Additionally I have outlined my exam and assessment/plan below:  PE: General: NAD, lying in bed comfortably HEENT: scleral icterus Abd: hepatosplenomegaly Extremities: no edema Neuro: non-focal exam  A/P: Jeffrey Perry is a 70 y.o. male admitted for abdominal pain, transaminitis, and fevers.   # Abdominal Pain: Etiology still largely unknown. Localizes pain to LLQ. CT imaging showed diverticulosis and suspect this may be the cause along with some constipation contributing to pain.  -Will obtain DG abdomen   -bowel regimen as above -GI consulted; appreciate recs  #Transaminitis/hyperbilirubinemia. Improving LFTs. Suspect transaminitis due to possible passed gallstone, with labs improving vs cholangitis vs viral. Does have jaundice on exam that is new for patient. Believe this will improve with improving lab values(bilirubin down-trending as well). Other work-up for sources have transaminitis negative. -haptoglobin and blood smear pending  #Fevers. Low-grade. No source of infection at this time. Etiologies we are considering include malaria and parasitic sources with fever in a traveler. Also differential could be viral etiology, cholangitis, diverticulitis.  -continue to hold off on antibiotics -Cultures are pending -ID consulted to help with work-up/differential -autoimmune work-ups till pending  Dispo: Pending further work-up and improvement in symptoms  Luiz Blare, DO PGY-2 05/03/2016, 2:13 PM

## 2016-05-04 ENCOUNTER — Encounter (HOSPITAL_COMMUNITY): Payer: Self-pay | Admitting: Obstetrics and Gynecology

## 2016-05-04 ENCOUNTER — Other Ambulatory Visit: Payer: Self-pay | Admitting: Internal Medicine

## 2016-05-04 DIAGNOSIS — R17 Unspecified jaundice: Secondary | ICD-10-CM

## 2016-05-04 DIAGNOSIS — D61818 Other pancytopenia: Secondary | ICD-10-CM | POA: Diagnosis not present

## 2016-05-04 DIAGNOSIS — R740 Nonspecific elevation of levels of transaminase and lactic acid dehydrogenase [LDH]: Secondary | ICD-10-CM | POA: Diagnosis not present

## 2016-05-04 DIAGNOSIS — K759 Inflammatory liver disease, unspecified: Secondary | ICD-10-CM

## 2016-05-04 LAB — URINE CULTURE: Culture: NO GROWTH

## 2016-05-04 LAB — HEPATIC FUNCTION PANEL
ALBUMIN: 2.9 g/dL — AB (ref 3.5–5.0)
ALT: 334 U/L — AB (ref 17–63)
AST: 227 U/L — AB (ref 15–41)
Alkaline Phosphatase: 233 U/L — ABNORMAL HIGH (ref 38–126)
BILIRUBIN DIRECT: 1.4 mg/dL — AB (ref 0.1–0.5)
Indirect Bilirubin: 1.2 mg/dL — ABNORMAL HIGH (ref 0.3–0.9)
TOTAL PROTEIN: 6.7 g/dL (ref 6.5–8.1)
Total Bilirubin: 2.6 mg/dL — ABNORMAL HIGH (ref 0.3–1.2)

## 2016-05-04 LAB — PROTIME-INR
INR: 1.12 (ref 0.00–1.49)
PROTHROMBIN TIME: 14.6 s (ref 11.6–15.2)

## 2016-05-04 LAB — MONONUCLEOSIS SCREEN: Mono Screen: NEGATIVE

## 2016-05-04 LAB — HAPTOGLOBIN: HAPTOGLOBIN: 205 mg/dL — AB (ref 34–200)

## 2016-05-04 MED ORDER — BISACODYL 10 MG RE SUPP: 10.0000 mg | Freq: Once | RECTAL | Status: DC

## 2016-05-04 NOTE — Discharge Summary (Signed)
[facility] Discharge Summary  Patient name: Jeffrey Perry Medical record number: [id number] Date of birth: 21-Nov-1946 Age: 70 y.o. Gender: male Date of Admission: 05/02/2016  Date of Discharge: 05/05/16 Admitting Physician: Lind Covert, MD  Primary Care Provider: Kennon Portela, MD Consultants: GI, ID  Indication for Hospitalization: Abdominal Pain  Discharge Diagnoses/Problem List:  Jaundice Transaminitis Prostate CA HTN Renal Insufficiency   Disposition: Discharged to home   Discharge Condition: Stable  Discharge Exam:  BP 140/68 mmHg  Pulse 60  Temp(Src) 98 F (36.7 C) (Oral)  Resp 18  Ht 6\' 1"  (1.854 m)  Wt 222 lb (100.699 kg)  BMI 29.30 kg/m2  SpO2 100% Physical Exam  Constitutional: He is oriented to person, place, and time and well-developed, well-nourished, and in no distress. No distress.  HENT:  Head: Normocephalic and atraumatic.  Eyes:  Mild scleral icterus   Cardiovascular: Normal rate, regular rhythm and normal heart sounds.   Pulmonary/Chest: Effort normal and breath sounds normal. No respiratory distress. He has no wheezes.  Abdominal: Soft. Bowel sounds are normal. He exhibits no distension and no mass. There is no tenderness. There is no rebound and no guarding.  Neurological: He is alert and oriented to person, place, and time.  Skin: Skin is warm and dry.  Psychiatric: Affect and judgment normal.  Nursing note and vitals reviewed.   Brief Hospital Course:  Pt was admitted on 5/4 for persistent abdominal pain that he localized to LLQ but it evolved into diffuse abdominal pain. Pt was previously discharged for same symptoms one day prior to admission due to improved LFTs, bilirubin and abdominal pain. Upon readmission pt had low grade fever, elevated LFTs, tbili, abdominal pain and constipation. GI and ID consulted for further evaluation considering pt's labs from previous admission were negative for Hep A/B/D,  ASMA, an U/S abd and CT abd showing gallbladder distention without signs of stones or an obstruction. ASMA was mildly elevated and AMA negative. GI recommended having MRCP which was unremarkable; some gallbladder distension without any obstruction.   Source of transaminitis unknown. Suspected etiologies included non-FDA supplement patient was taking prior to admission, transient viral infection, or autoimmune. Work-up unremarkable. Abdominal pain possibly due to constipation; based on imaging and work-up gallbladder etiology less likely. Had no tenderness on palpation. CT Abd showed diverticulosis. Upon discharge pt had no complaints of abdominal pain with liver enzymes downtrending and jaundice improved.   Issues for Follow Up:  1. Pancytopenia 2. Liver enzymes: Follow up with GI within next week.   3. Follow-up pending labs - see below   Significant Procedures: MRCP  Significant Labs and Imaging:   Recent Labs Lab 05/01/16 0634 05/02/16 2037 05/03/16 0304  WBC 6.9 3.9* 3.5*  HGB 11.2* 11.8* 11.0*  HCT 34.3* 35.4* 32.0*  PLT 112* 118* 106*    Recent Labs Lab 04/29/16 1848 04/30/16 1830 05/01/16 0634 05/02/16 2037 05/03/16 0304 05/04/16 0521  NA 137 137 137 134*  --   --   K 3.3* 3.9 3.4* 3.8  --   --   CL 103 105 105 104  --   --   CO2 27 25 22 26   --   --   GLUCOSE 141* 91 75 122*  --   --   BUN 19 16 13 8   --   --   CREATININE 1.67* 1.74* 1.51* 1.36* 1.23  --   CALCIUM 9.1 8.4* 8.3* 9.1  --   --   [facility]  233* 250* 222* 231*  --  233*  AST 239* 660* 359* 144*  --  227*  ALT 126* 941* 673* 387*  --  334*  ALBUMIN 3.5 3.2* 2.7* 2.7*  --  2.9*   Lipase wnl Hepatic panel neg ANA neg Anti-smooth muscle 22 (weak positive) Antimicrosomoal Ab-liver - negative TSH wnl HIV neg Parasite exam blood negative Haptoglobin 205  Results/Tests Pending at Time of Discharge:  1. Hepatitis E 2. CMV dna 3. Cultures  Discharge Medications:    Medication List    TAKE these  medications        alfuzosin 10 MG 24 hr tablet  Commonly known as:  UROXATRAL  TAKE 1 TABLET (10 MG TOTAL) BY MOUTH DAILY WITH BREAKFAST     diltiazem 180 MG 24 hr capsule  Commonly known as:  TIAZAC  TAKE 1 CAPSULE (240 MG TOTAL) BY MOUTH DAILY.     ibuprofen 200 MG tablet  Commonly known as:  ADVIL,MOTRIN  Take 400 mg by mouth every 6 (six) hours as needed (pain).     losartan-hydrochlorothiazide 100-12.5 MG tablet  Commonly known as:  HYZAAR  Take 1 tablet by mouth daily.     pantoprazole 20 MG tablet  Commonly known as:  PROTONIX  Take 1 tablet (20 mg total) by mouth daily.     senna-docusate 8.6-50 MG tablet  Commonly known as:  Senokot-S  Take 1 tablet by mouth at bedtime as needed for mild constipation.        Discharge Instructions: Please refer to Patient Instructions section of EMR for full details.  Patient was counseled important signs and symptoms that should prompt return to medical care, changes in medications, dietary instructions, activity restrictions, and follow up appointments.   Follow-Up Appointments:   Follow-up Information    Follow up with Silvano Rusk, MD On 05/10/2016.   Specialty:  Gastroenterology   Why:  Go to lab in basement and have blood drawn and Dr. Carlean Purl will call results   Contact information:   [address] [place] [address] [phone]       Schedule an appointment as soon as possible for a visit with GUEST, Veneda Melter, MD.   Specialty:  Internal Medicine   Why:  for hospital follow-up   Contact information:   [place] [id number]       [place], Med Student 05/04/2016, 3:27 PM  RESIDENT ADDENDUM I have separately seen and examined the patient. I have discussed the findings and exam with the medical student and agree with the above note. I helped develop the management plan that is described in the student's note, and I agree with the content. Any additions by me have been  made in blue ink.   Luiz Blare, DO 05/05/2016, 12:08 PM

## 2016-05-04 NOTE — Progress Notes (Signed)
Progress Note   Subjective  Mr. Jeffrey Perry is a 70 year old African-American male who is being consulted for continued abdominal pain and jaundice. This morning the patient is found laying in bed with his daughter by his bedside. He tells me that he is feeling somewhat better today. He does admit that he has not been able to have a bowel movement, regardless of the increase to BID dosing of MiraLAX yesterday. He does tell me he has had a decrease in abdominal pain and thinks this would be "even better after a bowel movement". His daughter does ask questions regarding testing that was ordered yesterday.   Objective   Vital signs in last 24 hours: Temp:  [98.5 F (36.9 C)-99.3 F (37.4 C)] 98.5 F (36.9 C) (06/06 0522) Pulse Rate:  [48-57] 48 (06/06 0522) Resp:  [18] 18 (06/06 0522) BP: (124-155)/(63-78) 155/63 mmHg (06/06 0522) SpO2:  [96 %-100 %] 98 % (06/06 0522) Last BM Date: 05/02/16 General: Pleasant African American male in NAD Eyes: Decreased icterus Heart:  Regular rate and rhythm; no murmurs Lungs: Respirations even and unlabored, lungs CTA bilaterally Abdomen:  Soft, continued mild ttp in RUQ and mild distension. Decreased bowel sounds, all four quadrants Extremities:  Without edema. Neurologic:  Alert and oriented,  grossly normal neurologically. Psych:  Cooperative. Normal mood and affect.  Intake/Output from previous day: 06/05 0701 - 06/06 0700 In: 880 [P.O.:880] Out: -   Lab Results:  Recent Labs  05/02/16 2037 05/03/16 0304  WBC 3.9* 3.5*  HGB 11.8* 11.0*  HCT 35.4* 32.0*  PLT 118* 106*   BMET  Recent Labs  05/02/16 2037 05/03/16 0304  NA 134*  --   K 3.8  --   CL 104  --   CO2 26  --   GLUCOSE 122*  --   BUN 8  --   CREATININE 1.36* 1.23  CALCIUM 9.1  --    LFT  Recent Labs  05/04/16 0521  PROT 6.7  ALBUMIN 2.9*  AST 227*  ALT 334*  ALKPHOS 233*  BILITOT 2.6*  BILIDIR 1.4*  IBILI 1.2*   PT/INR  Recent Labs  05/04/16 0521    LABPROT 14.6  INR 1.12    Studies/Results: Mr 3d Recon At Scanner  05/04/2016  CLINICAL DATA:  Abdominal pain for 5 days. Elevated liver function studies. EXAM: MRI ABDOMEN WITHOUT AND WITH CONTRAST (INCLUDING MRCP) TECHNIQUE: Multiplanar multisequence MR imaging of the abdomen was performed both before and after the administration of intravenous contrast. Heavily T2-weighted images of the biliary and pancreatic ducts were obtained, and three-dimensional MRCP images were rendered by post processing. CONTRAST:  45mL MULTIHANCE GADOBENATE DIMEGLUMINE 529 MG/ML IV SOLN COMPARISON:  CT scan 04/29/2016 and ultrasound examination 04/29/2016. FINDINGS: Examination is somewhat limited by breathing motion artifact. Lower chest: The lung bases are grossly clear except for minimal dependent atelectasis. No pleural or pericardial effusion. Borderline cardiac enlargement. Hepatobiliary: No focal hepatic lesions or intrahepatic biliary dilatation. Normal caliber and course of the common bile duct. No obstructing common bile duct stone. No ampullary lesion or pancreatic head mass. Gallbladder is distended. There is mild gallbladder wall thickening and pericholecystic fluid. No definite gallstones. Findings could be due to low albumin or acalculus cholecystitis. Pancreas: No mass, inflammation or ductal dilatation. Normal caliber and course of the main pancreatic duct. Spleen: Normal size.  No focal lesions. Adrenals/Urinary Tract: The adrenal glands and kidneys are normal. Stomach/Bowel: The stomach, duodenum, visualized small bowel and visualized colon are grossly  normal. Stable duodenum diverticulum involving the third portion the duodenum. Vascular/Lymphatic: The aorta and branch vessels are patent. The major venous structures are patent. No mesenteric or retroperitoneal mass or adenopathy. Other: No ascites or abdominal wall hernia. Musculoskeletal: No significant bony findings. IMPRESSION: 1. Examination is somewhat  limited by breathing motion artifact. 2. Distended gallbladder with mild wall thickening and pericholecystic fluid. This could be due to low albumin or acalculus cholecystitis. 3. Normal caliber and course of the common bile duct and main pancreatic duct. No common bile duct stones, ampullary lesion or pancreatic head mass. No findings for acute pancreatitis. Electronically Signed   By: Marijo Sanes M.D.   On: 05/04/2016 08:16   Dg Abd Portable 1v  05/03/2016  CLINICAL DATA:  Abdominal pain starting 04/29/2016 EXAM: PORTABLE ABDOMEN - 1 VIEW COMPARISON:  CT scan 04/29/2016 FINDINGS: There is normal small bowel gas pattern. Some colonic stool noted in right colon and proximal transverse colon. Some colonic gas noted in transverse colon and descending colon without significant colonic distension. IMPRESSION: Normal small bowel gas pattern. Colonic stool and gas as described above. Electronically Signed   By: Lahoma Crocker M.D.   On: 05/03/2016 17:13   Mr Abd W/wo Cm/mrcp  05/04/2016  CLINICAL DATA:  Abdominal pain for 5 days. Elevated liver function studies. EXAM: MRI ABDOMEN WITHOUT AND WITH CONTRAST (INCLUDING MRCP) TECHNIQUE: Multiplanar multisequence MR imaging of the abdomen was performed both before and after the administration of intravenous contrast. Heavily T2-weighted images of the biliary and pancreatic ducts were obtained, and three-dimensional MRCP images were rendered by post processing. CONTRAST:  21mL MULTIHANCE GADOBENATE DIMEGLUMINE 529 MG/ML IV SOLN COMPARISON:  CT scan 04/29/2016 and ultrasound examination 04/29/2016. FINDINGS: Examination is somewhat limited by breathing motion artifact. Lower chest: The lung bases are grossly clear except for minimal dependent atelectasis. No pleural or pericardial effusion. Borderline cardiac enlargement. Hepatobiliary: No focal hepatic lesions or intrahepatic biliary dilatation. Normal caliber and course of the common bile duct. No obstructing common bile  duct stone. No ampullary lesion or pancreatic head mass. Gallbladder is distended. There is mild gallbladder wall thickening and pericholecystic fluid. No definite gallstones. Findings could be due to low albumin or acalculus cholecystitis. Pancreas: No mass, inflammation or ductal dilatation. Normal caliber and course of the main pancreatic duct. Spleen: Normal size.  No focal lesions. Adrenals/Urinary Tract: The adrenal glands and kidneys are normal. Stomach/Bowel: The stomach, duodenum, visualized small bowel and visualized colon are grossly normal. Stable duodenum diverticulum involving the third portion the duodenum. Vascular/Lymphatic: The aorta and branch vessels are patent. The major venous structures are patent. No mesenteric or retroperitoneal mass or adenopathy. Other: No ascites or abdominal wall hernia. Musculoskeletal: No significant bony findings. IMPRESSION: 1. Examination is somewhat limited by breathing motion artifact. 2. Distended gallbladder with mild wall thickening and pericholecystic fluid. This could be due to low albumin or acalculus cholecystitis. 3. Normal caliber and course of the common bile duct and main pancreatic duct. No common bile duct stones, ampullary lesion or pancreatic head mass. No findings for acute pancreatitis. Electronically Signed   By: Marijo Sanes M.D.   On: 05/04/2016 08:16       Assessment / Plan:   Impression: 1. Generalized abd pain: 6d of generalized abdominal pain, initial concern for gallbladder etiology in the setting of elevated LFTs, these have continued to trend down over past 3-4 days, recent MRCP as above, no stones or mass, continued distension of gallbladder with mild wall thickening of  uncertain significance?, normal bile ducts-continue to consider infectious process, monospot returned negative, CMV PCR pending, ASMA pending, PT/INR pending 2. Elevated LFT's: Acetaminophen level normal, hep panel neg , HIV neg- see above 3. Constipation: No  results with Senokot, miralax or stool softeners while at home, pt now increasing activity and diet, continue BID MiraLax, if no results could increase to QID.  Plan: 1. Await results of CMV PCR, ASMA, PT/INR 2. Continue Miralax BID scheduled, ordered Dulcolax suppository once and repeat if necessary 3. Added Hep E testing 4. Increase diet as tolerated 5. Increase activity as tolerated 6. Discussed above with Dr. Carlean Purl, please await any further recs  Thank you for your kind consultation, we will continue to follow    LOS: 1 day   Levin Erp  05/04/2016, 9:39 AM    [facility] Attending   I have taken an interval history, reviewed the chart and examined the patient. I agree with the Advanced Practitioner's note, impression and recommendations.   Whatever he has is improving so I recommend dc and f/u LFT's next week with me - labs only - I have ordered Once I see those we will call and arrange other f/u Needs to repeat LFT's June 12.  Gatha Mayer, MD, [facility] [phone] (pager) [phone] after 5 PM, weekends and holidays  05/04/2016 5:36 PM

## 2016-05-04 NOTE — Progress Notes (Signed)
    [facility] Infectious Disease   Reason for visit: Follow up on fever  Interval History: MRCP with no signficant findings, does have some biliary distension.  Patient feels better, afebrile.  ANA negative, ASMA minimally elevated, AMA wnl. Eating, had a bowel movement.    Physical Exam: Constitutional:  Filed Vitals:   05/03/16 1943 05/04/16 0522  BP: 124/69 155/63  Pulse: 57 48  Temp: 99.3 F (37.4 C) 98.5 F (36.9 C)  Resp: 18 18   patient appears in NAD Respiratory: Normal respiratory effort; CTA B Cardiovascular: RRR GI: soft, nt  Review of Systems: Constitutional: negative for fevers and chills Gastrointestinal: negative for diarrhea Musculoskeletal: negative for myalgias and arthralgias  Lab Results  Component Value Date   WBC 3.5* 05/03/2016   HGB 11.0* 05/03/2016   HCT 32.0* 05/03/2016   MCV 64.1* 05/03/2016   PLT 106* 05/03/2016    Lab Results  Component Value Date   CREATININE 1.23 05/03/2016   BUN 8 05/02/2016   NA 134* 05/02/2016   K 3.8 05/02/2016   CL 104 05/02/2016   CO2 26 05/02/2016    Lab Results  Component Value Date   ALT 334* 05/04/2016   AST 227* 05/04/2016   ALKPHOS 233* 05/04/2016     Microbiology: Recent Results (from the past 240 hour(s))  Culture, blood (routine x 2)     Status: None (Preliminary result)   Collection Time: 05/03/16  5:30 AM  Result Value Ref Range Status   Specimen Description BLOOD LEFT ANTECUBITAL  Final   Special Requests BOTTLES DRAWN AEROBIC AND ANAEROBIC 5CC  Final   Culture NO GROWTH < 12 HOURS  Final   Report Status PENDING  Incomplete  Culture, blood (routine x 2)     Status: None (Preliminary result)   Collection Time: 05/03/16  5:37 AM  Result Value Ref Range Status   Specimen Description BLOOD LEFT ANTECUBITAL  Final   Special Requests BOTTLES DRAWN AEROBIC AND ANAEROBIC 5CC  Final   Culture NO GROWTH < 12 HOURS  Final   Report Status PENDING  Incomplete  Urine culture     Status:  None   Collection Time: 05/03/16  6:54 AM  Result Value Ref Range Status   Specimen Description URINE, CLEAN CATCH  Final   Special Requests NONE  Final   Culture NO GROWTH  Final   Report Status 05/04/2016 FINAL  Final    Impression/Plan:  1. Fever - resolved.  Hopefully won't return. 2. transaminitis - unclear etiology.  MRCP unrevealing.  Not c/w colangitis.  I will stop zosyn.  HIV negative.  May be viral etiology.  Improving.  Hepatitis E to be checked, hepatitis panel otherwise negative.  3. Pancytopenia - also unclear.  Should be rechecked in 1-2 weeks, likely transient due to acute illness.   I will sign off, please call with questions or new findings.  Thanks

## 2016-05-04 NOTE — Progress Notes (Signed)
Patient ID: Jeffrey Perry, male   DOB: 06-03-1946, 70 y.o.   MRN: [id number] Family Medicine Teaching Service Daily Progress Note Intern Pager: [phone]  Patient name: Jeffrey Perry Medical record number: [id number] Date of birth: June 19, 1946 Age: 70 y.o. Gender: male  Primary Care Provider: Kennon Portela, MD Consultants: ID and GI Code Status: Full code   Pt Overview and Major Events to Date:  6/4 - Readmitted for diffuse abdominal pain 6/5 - Continued abdominal pain. Slight temp at 100.6. Consulted ID and GI. Ordered MRCP, CMV, PT/INR.   Assessment and Plan: 70yo with PMH of prostate CA, HTN, renal insufficiency presenting with abdominal pain and fever of 100.6.  Abdominal pain: Resolved today.  -Unknown etiology but ddx includes viral infection vs cholangitis. Last travel to [place] Aug 2016 but received doxy prophylaxis  -GI consulted. Rec ordered MRCP showing distention of gallbladder with possible acalculous.    -Down trending AST 359->144-->227  ALT 673->387-->334 -Hep panel neg, HIV neg -No recent tylenol intake -Tbili- 3.8-->2.6 indirect-1.4-->1.2 direct 2.4 --> 1.4  -KUB showing stool in ascending and transverse colon.   Constipation: -Last BM 6 days ago -Mirilax BID -Senna prn -Has PRN fleet enema that has not taken yet.   Fever: Resolved  -DDx includes cholangitis vs infectious -Consulted ID. Appreciate their rec. Started zosyn   -Blood and urine cultures pending  -parasitic screen neg -mono spot neg -UA without bacteria -CXR with mild Left basilar opacity and atelectasis  -smear pending pathologist review  Anemia: Hemoglobin 11. (Baseline ~13). No signs/symtpoms of bleeding. -Follow Hgb with CBC -Haptoglobin 205. Mildly elevated consistent with inflammatory process   Hypertension: -Systolic range 009-381.  -Continue home medication Diltiazem 18m and Losartan/hctz 100/12.572m-Consider IV hydralazine prn if blood pressures remain elevated.   H/O Prostate  Cancer: Home medications include Alfuzosin. Last PSA normal in 12/2015. - Continue home medication  Night Sweats: Chronic. History of travel to [place] June 2016 but had prophylaxis. Unknown etiology at this time.  - TSH and HIV wnl at last admission. - CXR 6/2 with mild left basilar opacity, atelectasis vs. mild PNA - Consider PPD as outpatient.   Hyperglycemia: Was previously elevated on 6/1. Normal on this admission at 8961 - A1C 6.0  Hx of Paget's disease. Seen on bone scans. Mainly with disease in pelvis. -Alk phos 233 -Currently asymptomatic  FEN/GI: Heart Healthy Diet  NS 75cc/hr  PPx: Lovenox  Disposition: Can likely go home pending GI recommendations.   Subjective:  Over night no acute problems. No abdominal pain. Still has not had bowel movement. He denies weight loss, diarrhea, arthralgias, HA, neck pain, nuchal rigidity.   Objective: Temp:  [98.5 F (36.9 C)-99.3 F (37.4 C)] 98.5 F (36.9 C) (06/06 0522) Pulse Rate:  [48-57] 48 (06/06 0522) Resp:  [18] 18 (06/06 0522) BP: (124-155)/(63-78) 155/63 mmHg (06/06 0522) SpO2:  [96 %-100 %] 98 % (06/06 0522) Physical Exam: General: No acute distress.  Cardiovascular: RRR, no murmurs Respiratory: clear bilaterally, no wheezes, no rales Abdomen: Soft, non-tender palpation. No masses Extremities: No rashes, Moving all extremities  Laboratory:  Recent Labs Lab 05/01/16 0634 05/02/16 2037 05/03/16 0304  WBC 6.9 3.9* 3.5*  HGB 11.2* 11.8* 11.0*  HCT 34.3* 35.4* 32.0*  PLT 112* 118* 106*    Recent Labs Lab 04/30/16 1830 05/01/16 0634 05/02/16 2037 05/03/16 0304 05/03/16 0537 05/04/16 0521  NA 137 137 134*  --   --   --   K 3.9 3.4* 3.8  --   --   --  CL 105 105 104  --   --   --   CO2 _0 --   --   --   BUN _1 --   --   --   CREATININE 1.74* 1.51* 1.36* 1.23  --   --   CALCIUM 8.4* 8.3* 9.1  --   --   --   PROT 6.6 5.5* 6.8  --   --  6.7  BILITOT 4.7* 5.0* 4.4*  --  3.8* 2.6*   ALKPHOS 250* 222* 231*  --   --  233*  ALT 941* 673* 387*  --   --  334*  AST 660* 359* 144*  --   --  227*  GLUCOSE 91 75 122*  --   --   --    Imaging/Diagnostic Tests: Mr 3d Recon At Scanner  05/04/2016  IMPRESSION: 1. Examination is somewhat limited by breathing motion artifact. 2. Distended gallbladder with mild wall thickening and pericholecystic fluid. This could be due to low albumin or acalculus cholecystitis. 3. Normal caliber and course of the common bile duct and main pancreatic duct. No common bile duct stones, ampullary lesion or pancreatic head mass. No findings for acute pancreatitis. Electronically Signed   By: Marijo Sanes M.D.   On: 05/04/2016 08:16   Dg Abd Portable 1v 05/03/2016  IMPRESSION: Normal small bowel gas pattern. Colonic stool and gas as described above. Electronically Signed   By: Lahoma Crocker M.D.   On: 05/03/2016 17:13   Mr Abd W/wo Cm/mrcp 05/04/2016  IMPRESSION: 1. Examination is somewhat limited by breathing motion artifact. 2. Distended gallbladder with mild wall thickening and pericholecystic fluid. This could be due to low albumin or acalculus cholecystitis. 3. Normal caliber and course of the common bile duct and main pancreatic duct. No common bile duct stones, ampullary lesion or pancreatic head mass. No findings for acute pancreatitis. Electronically Signed   By: Marijo Sanes M.D.   On: 05/04/2016 08:16    Verdia Kuba Courts, Med Student 05/04/2016, 7:38 AM [place] Intern pager: [phone], text pages welcome  RESIDENT ADDENDUM I have separately seen and examined the patient. I have discussed the findings and exam with the medical student and agree with the above note. I helped develop the management plan that is described in the student's note, and I agree with the content. Additionally I have outlined my exam and assessment/plan below:  PE: General: NAD, alert and cooperative, pleasant HEENT: scleral icterus Extremities: no  edema Neuro: non-focal exam  A/P: Jeffrey Perry is a 70 y.o. male admitted for abdominal pain, transaminitis, and fevers.   #Abdominal Pain: Resolved. Etiology still largely unknown. Localizes pain to LLQ. CT imaging showed diverticulosis and suspect this may be the cause along with some constipation contributing to pain. Initial concern for gallbladder etiology and MRCP performed was unreveiling. Negative RUQ [place]. -on bowel regimen. F/u BM -GI consulted; appreciate recs  #Transaminitis/hyperbilirubinemia. Improving LFTs. Suspect transaminitis due to possible passed gallstone, with labs improving vs cholangitis vs viral. Does have jaundice on exam that is new for patient. Believe this will improve with improving lab values(bilirubin down-trending as well). Other work-up for sources have transaminitis negative (Acetaminophen level normal, hep panel neg , HIV neg) -blood smear pending  #Fevers. Low-grade on admission but now resolved. No source of infection at this time. Etiologies we are considering included malaria and parasitic sources with fever in a traveler but these are less liekly.  Also differential could be viral etiology, cholangitis, diverticulitis, or autoimmune. Cannot r/o malignancy.Thus far work-up largely negative. -continue to hold off on antibiotics -Cultures are pending -ID consulted to help with work-up/differential -autoimmune work-up still pending -CMV PCR pending, ASMA pending, PT/INR pending  Dispo: Pending further work-up and improvement in symptoms  Luiz Blare, DO 05/04/2016, 1:52 PM PGY-2, [place]

## 2016-05-05 LAB — HEPATIC FUNCTION PANEL
ALT: 312 U/L — ABNORMAL HIGH (ref 17–63)
AST: 229 U/L — AB (ref 15–41)
Albumin: 2.8 g/dL — ABNORMAL LOW (ref 3.5–5.0)
Alkaline Phosphatase: 210 U/L — ABNORMAL HIGH (ref 38–126)
BILIRUBIN DIRECT: 0.9 mg/dL — AB (ref 0.1–0.5)
BILIRUBIN INDIRECT: 0.9 mg/dL (ref 0.3–0.9)
Total Bilirubin: 1.8 mg/dL — ABNORMAL HIGH (ref 0.3–1.2)
Total Protein: 6.7 g/dL (ref 6.5–8.1)

## 2016-05-05 LAB — CMV DNA BY PCR, QUALITATIVE: CMV DNA, Qual PCR: NEGATIVE

## 2016-05-05 LAB — ANTI-SMOOTH MUSCLE ANTIBODY, IGG: F-ACTIN AB IGG: 25 U — AB (ref 0–19)

## 2016-05-05 LAB — PARASITE EXAM, BLOOD

## 2016-05-05 NOTE — Discharge Instructions (Signed)
Unknown reason for acute illness however it appears to have resolved. No infectious source was found. Labs have improved.  One possibility could be the supplemental medicine you were on. PLEASE DISCONTINUE ALL FDA NON-APPROVED SUPPLEMENTS! These medicines have unknown ingredients.   Please follow-up with PCP and GI as instructed.   No concern from our team about travel.  Liver Function Tests Liver function tests are blood tests to see how well your liver is working. The proteins and enzymes measured in the test can alert your health care provider to inflammation, damage, or disease in your liver. It is common to have liver function tests:  During annual physical exams.  When you are taking certain medicines.  If you have liver disease.  If you drink a lot of alcohol.  When you are not feeling well.  When you have other conditions that may affect the liver. Substances measured may include:   Alanine transaminase (ALT). This is an enzyme in the liver.  Aspartate transaminase (AST). This is an enzyme in the liver, heart, and muscles.  Alkaline phosphatase (ALP). This is a protein in the liver, bile ducts, and bone. It is also in other body tissues.  Total bilirubin. This is a yellow pigment in bile.  Albumin. This is a protein in the liver.  Prothrombin time and international normalized ratio (PT and INR). PT measures the time that it takes for your blood to clot. INR is a calculation of blood clotting time based upon your PT result. It is also calculated based on normal ranges defined by the laboratory that processed your lab test.  Total protein. This measures two proteins, albumin and globulin, found in the blood. PREPARATION FOR TEST How you prepare will depend on which tests are being done and the reason why these tests are being done. You may need to:  Avoid eating for 4-6 hours before the test or as directed by your health care provider.  Stop taking certain medicines  prior to your blood test as directed by your health care provider. RESULTS  It is your responsibility to obtain your test results. Ask the lab or department performing the test when and how you will get your results. Contact your health care provider to discuss any questions you have about your results. RANGE OF NORMAL VALUES Ranges for normal values may vary among different labs and hospitals. You should always check with your health care provider after having lab work or other tests done to discuss the meaning of your test results and whether your values are considered within normal limits. The following are normal ranges for substances measured in liver function tests: ALT  Infant: may be twice as high as adult values.  Child or adult: 4-36 international units/L at 37C or 4-36 units/L (SI units).  Elderly: may be slightly higher than adult values. AST  Newborn 59-69 days old: 35-140 units/L.  Child under 43 years old: 15-60 units/L.  40-31 years old: 15-50 units/L.  58-52 years old: 10-50 units/L.  66-28 years old: 10-40 units/L.  Adult: 0-35 units/L or 0-0.58 microkatal/L (SI units).  Elderly: slightly higher than adults. ALP  Child under 26 years old: 85-235 units/L.  28-52 years old: 65-210 units/L.  39-75 years old: 60-300 units/L.  90-60 years old: 30-200 units/L.  Adult: 30-120 units/L or 0.5-2.0 microkatal/L (SI units).  Elderly: slightly higher than adult. Total bilirubin  Newborn: 1.0-12.0 mg/dL or 17.1-205 micromoles/L (SI units).  Adult, elderly, or child: 0.3-1.0 mg/dL or 5.1-17 micromoles/L. Albumin  Premature infant: 3.0-4.2 g/dL.  Newborn: 3.5-5.4 g/dL.  Infant: 4.4-5.4 g/dL.  Child: 4.0-5.9 g/dL.  Adult or elderly: 3.5-5.0 g/dL or 35-50 g/L (SI units). PT  11.0-12.5 seconds; 85%-100%. INR  0.8-1.1. Total protein  Premature infant: 4.2-7.6 g/dL.  Newborn: 4.6-7.4 g/dL.  Infant: 6.0-6.7 g/dL.  Child: 6.2-8.0 g/dL.  Adult or elderly:  6.4-8.3 g/dL or 64-83 g/L (SI units). MEANING OF RESULTS OUTSIDE NORMAL VALUE RANGES Sometimes test results can be abnormal due to other factors, such as medicines, exercise, or pregnancy. Follow up with your health care provider if you have any questions about test results outside the normal value ranges. ALT  Levels above the normal range, along with other test results, may indicate liver disease. AST  Levels above the normal range, along with other test results, may indicate liver disease. Sometimes levels also increase after burns, surgery, heart attack, muscle damage, or seizure. ALP  Levels above the normal range, along with other test results, may indicate biliary obstruction, diseases of the liver, bone disease, thyroid disease, tumors, fractures, leukemia or lymphoma, or several other conditions. People with blood type O or B may show higher levels after a fatty meal.  Levels below the normal range, along with other test results, may indicate bone and teeth conditions, malnutrition, protein deficiency, or Wilson disease. Total bilirubin  Levels above the normal range, along with other test results, may indicate problems with the liver, gallbladder, or bile ducts. Albumin  Levels above the normal range, along with other test results, may indicate dehydration. They may also be caused by a diet that is high in protein. Sometimes, the band placed around the upper arm during the process of drawing blood can cause the level of this protein in your blood to rise and give you a result above the normal range.  Levels below the normal range, along with other tests results, may indicate kidney disease, liver disease, or malabsorption of nutrients. PT and INR  Levels above the normal range mean your blood is clotting slower than normal. This may be due to blood disorders, liver disorders, or low levels of vitamin K. Total protein  Levels above the normal range, along with other test results,  may be due to infection or other diseases.  Levels below the normal range, along with other test results, may be due to an immune system disorder, bleeding, burns, kidney disorder, liver disease, trouble absorbing or getting enough nutrients, or other conditions that affect the intestines.   This information is not intended to replace advice given to you by your health care provider. Make sure you discuss any questions you have with your health care provider.   Document Released: 12/18/2004 Document Revised: 12/06/2014 Document Reviewed: 03/21/2014 Elsevier Interactive Patient Education [employer].

## 2016-05-05 NOTE — Progress Notes (Signed)
Pt discharged home with family. IV removed. Discharge instructions given. Pt questions asked and answered. Pt to transfer from unit via wheelchair and transport home with family. Wendee Copp

## 2016-05-07 ENCOUNTER — Telehealth: Payer: Self-pay | Admitting: Gastroenterology

## 2016-05-07 ENCOUNTER — Emergency Department (HOSPITAL_COMMUNITY)
Admission: EM | Admit: 2016-05-07 | Discharge: 2016-05-07 | Disposition: A | Discharge status: Home / Self Care | Payer: BC Managed Care – PPO | Attending: Emergency Medicine | Admitting: Emergency Medicine

## 2016-05-07 ENCOUNTER — Encounter (HOSPITAL_COMMUNITY): Payer: Self-pay

## 2016-05-07 DIAGNOSIS — N189 Chronic kidney disease, unspecified: Secondary | ICD-10-CM | POA: Diagnosis not present

## 2016-05-07 DIAGNOSIS — I129 Hypertensive chronic kidney disease with stage 1 through stage 4 chronic kidney disease, or unspecified chronic kidney disease: Secondary | ICD-10-CM | POA: Insufficient documentation

## 2016-05-07 DIAGNOSIS — Z8546 Personal history of malignant neoplasm of prostate: Secondary | ICD-10-CM | POA: Diagnosis not present

## 2016-05-07 DIAGNOSIS — Z91040 Latex allergy status: Secondary | ICD-10-CM | POA: Diagnosis not present

## 2016-05-07 DIAGNOSIS — R319 Hematuria, unspecified: Secondary | ICD-10-CM | POA: Insufficient documentation

## 2016-05-07 LAB — URINALYSIS, ROUTINE W REFLEX MICROSCOPIC
Bilirubin Urine: NEGATIVE
Glucose, UA: NEGATIVE mg/dL
Ketones, ur: 15 mg/dL — AB
Nitrite: NEGATIVE
Protein, ur: 30 mg/dL — AB
Specific Gravity, Urine: 1.01 (ref 1.005–1.030)
pH: 7 (ref 5.0–8.0)

## 2016-05-07 LAB — URINE MICROSCOPIC-ADD ON

## 2016-05-07 NOTE — ED Notes (Signed)
Pt. Developed hematuria last night around 17:00 his wife took him to [facility] last night.  They monitored him and he was able to urinate and they instructed for him to call his Uroloigst, which he has , and they have not called him back.  He has not urinated since 10:00 AM.  He denies any dsyuria, but does have urgency.

## 2016-05-07 NOTE — Discharge Instructions (Signed)

## 2016-05-07 NOTE — ED Notes (Signed)
Pt. Had blood work and cat scan done at [facility] last night , was told everything was okay.  Pt. Had a kidney stone.

## 2016-05-07 NOTE — Telephone Encounter (Signed)
His wife called about overt painless hematuria; she was clear that it was not just dark urine but that there was red blood in it.  I recommended she call his PCP or his urologist to discuss the hematuria.

## 2016-05-08 LAB — CULTURE, BLOOD (ROUTINE X 2)
CULTURE: NO GROWTH
Culture: NO GROWTH

## 2016-05-08 LAB — URINE CULTURE: Culture: NO GROWTH

## 2016-05-10 ENCOUNTER — Other Ambulatory Visit (INDEPENDENT_AMBULATORY_CARE_PROVIDER_SITE_OTHER): Payer: BC Managed Care – PPO

## 2016-05-10 DIAGNOSIS — R17 Unspecified jaundice: Secondary | ICD-10-CM | POA: Diagnosis not present

## 2016-05-10 DIAGNOSIS — K759 Inflammatory liver disease, unspecified: Secondary | ICD-10-CM

## 2016-05-10 LAB — HEPATIC FUNCTION PANEL
ALBUMIN: 4 g/dL (ref 3.5–5.2)
ALT: 213 U/L — ABNORMAL HIGH (ref 0–53)
AST: 79 U/L — AB (ref 0–37)
Alkaline Phosphatase: 198 U/L — ABNORMAL HIGH (ref 39–117)
BILIRUBIN TOTAL: 1.4 mg/dL — AB (ref 0.2–1.2)
Bilirubin, Direct: 0.6 mg/dL — ABNORMAL HIGH (ref 0.0–0.3)
Total Protein: 7.6 g/dL (ref 6.0–8.3)

## 2016-05-11 ENCOUNTER — Other Ambulatory Visit: Payer: Self-pay

## 2016-05-11 ENCOUNTER — Ambulatory Visit (INDEPENDENT_AMBULATORY_CARE_PROVIDER_SITE_OTHER): Payer: BC Managed Care – PPO | Admitting: Emergency Medicine

## 2016-05-11 VITALS — BP 120/76 | HR 67 | Temp 98.2°F | Resp 16 | Ht 74.0 in | Wt 223.6 lb

## 2016-05-11 DIAGNOSIS — R011 Cardiac murmur, unspecified: Secondary | ICD-10-CM | POA: Diagnosis not present

## 2016-05-11 DIAGNOSIS — R319 Hematuria, unspecified: Secondary | ICD-10-CM

## 2016-05-11 DIAGNOSIS — I10 Essential (primary) hypertension: Secondary | ICD-10-CM

## 2016-05-11 DIAGNOSIS — Z87898 Personal history of other specified conditions: Secondary | ICD-10-CM | POA: Diagnosis not present

## 2016-05-11 DIAGNOSIS — R1030 Lower abdominal pain, unspecified: Secondary | ICD-10-CM

## 2016-05-11 DIAGNOSIS — R945 Abnormal results of liver function studies: Principal | ICD-10-CM

## 2016-05-11 DIAGNOSIS — R7989 Other specified abnormal findings of blood chemistry: Secondary | ICD-10-CM

## 2016-05-11 DIAGNOSIS — R748 Abnormal levels of other serum enzymes: Secondary | ICD-10-CM

## 2016-05-11 LAB — POCT CBC
GRANULOCYTE PERCENT: 59.5 % (ref 37–80)
HCT, POC: 37.7 % — AB (ref 43.5–53.7)
HEMOGLOBIN: 12.8 g/dL — AB (ref 14.1–18.1)
Lymph, poc: 2 (ref 0.6–3.4)
MCH: 22.7 pg — AB (ref 27–31.2)
MCHC: 34 g/dL (ref 31.8–35.4)
MCV: 66.8 fL — AB (ref 80–97)
MID (cbc): 0.3 (ref 0–0.9)
MPV: 7.6 fL (ref 0–99.8)
PLATELET COUNT, POC: 286 10*3/uL (ref 142–424)
POC Granulocyte: 3.3 (ref 2–6.9)
POC LYMPH PERCENT: 35.9 %L (ref 10–50)
POC MID %: 4.6 % (ref 0–12)
RBC: 5.65 M/uL (ref 4.69–6.13)
RDW, POC: 15.6 %
WBC: 5.5 10*3/uL (ref 4.6–10.2)

## 2016-05-11 LAB — POC MICROSCOPIC URINALYSIS (UMFC): Mucus: ABSENT

## 2016-05-11 LAB — POCT URINALYSIS DIP (MANUAL ENTRY)
Bilirubin, UA: NEGATIVE
Glucose, UA: NEGATIVE
Ketones, POC UA: NEGATIVE
Leukocytes, UA: NEGATIVE
NITRITE UA: NEGATIVE
PH UA: 7
Protein Ur, POC: NEGATIVE
RBC UA: NEGATIVE
Spec Grav, UA: 1.015
Urobilinogen, UA: 1

## 2016-05-11 NOTE — Progress Notes (Signed)
Quick Note:  Liver tests better Repeat again July3 ______

## 2016-05-11 NOTE — Patient Instructions (Signed)
     IF you received an x-ray today, you will receive an invoice from [facility]. Please contact [facility] at [phone] with questions or concerns regarding your invoice.   IF you received labwork today, you will receive an invoice from [facility]. Please contact [facility] at [phone] with questions or concerns regarding your invoice.   Our billing staff will not be able to assist you with questions regarding bills from these companies.  You will be contacted with the lab results as soon as they are available. The fastest way to get your results is to activate your My Chart account. Instructions are located on the last page of this paperwork. If you have not heard from us regarding the results in 2 weeks, please contact this office.      

## 2016-05-11 NOTE — Progress Notes (Signed)
By signing my name below, I, Jeffrey Perry, attest that this documentation has been prepared under the direction and in the presence of [name], MD. Electronically Signed: Moises Perry, [place]. 05/11/2016 , 1:46 PM .  Patient was seen in room 13 .  Chief Complaint:  Chief Complaint  Patient presents with  . Follow-up    feeling much better     HPI: Jeffrey Perry is a 70 y.o. male who reports to [facility] today for follow up.  Patient was seen in [facility] for hematuria 4 days ago. He was informed to follow up with an urologist. He has an appointment at [facility] in 2 days. While in the ED, he felt better after given antibiotic IV drip. He also had a liver test done with Dr. Carlean Purl, showing improved liver functions. He reports overall feeling much better, but mentions still having occasional sweats. He also notes some appetite loss with slight weight loss.   He wants to travel back home to [place].   Past Medical History  Diagnosis Date  . Hypertension   . Prostate cancer ([place])   . Chronic kidney disease   . Paget disease of bone    Past Surgical History  Procedure Laterality Date  . Hydrocele excision  2012  . Finger ganglion cyst excision  2011    3rd finger right hand   Social History   Social History  . Marital Status: Married    Spouse Name: [name]  . Number of Children: [name]  . Years of Education: [name]   Occupational History  . professor    Social History Main Topics  . Smoking status: Never Smoker   . Smokeless tobacco: Never Used  . Alcohol Use: No  . Drug Use: No  . Sexual Activity:    Partners: Female   Other Topics Concern  . Not on file   Social History Narrative   Family History  Problem Relation Age of Onset  . Hypertension Sister   . Hypertension Brother   . Colon cancer Neg Hx   . Hypertension Mother   . Hypertension Brother   . Stroke Sister   . Hypertension Sister    Allergies  Allergen Reactions  . Latex Itching  . Lisinopril Cough  .  Neuromuscular Blocking Agents Swelling    Ankle swelling  . Pyridium [Phenazopyridine Hcl] Swelling    Ankle swelling  . Sulfonamide Derivatives Itching   Prior to Admission medications   Medication Sig Start Date End Date Taking? Authorizing Provider  alfuzosin (UROXATRAL) 10 MG 24 hr tablet TAKE 1 TABLET (10 MG TOTAL) BY MOUTH DAILY WITH BREAKFAST Patient taking differently: Take 10 mg by mouth at bedtime.  01/21/16   [name], MD  diltiazem (TIAZAC) 180 MG 24 hr capsule TAKE 1 CAPSULE (240 MG TOTAL) BY MOUTH DAILY. Patient taking differently: Take 180 mg by mouth at bedtime.  01/21/16   [name], MD  ibuprofen (ADVIL,MOTRIN) 200 MG tablet Take 400 mg by mouth every 6 (six) hours as needed (pain).    Historical Provider, MD  losartan-hydrochlorothiazide (HYZAAR) 100-12.5 MG tablet Take 1 tablet by mouth daily. 01/21/16   [name], MD  pantoprazole (PROTONIX) 20 MG tablet Take 1 tablet (20 mg total) by mouth daily. Patient not taking: Reported on 05/07/2016 04/29/16   Nona Dell, PA-C     ROS:  Constitutional: negative for fever, chills, weight changes, or fatigue; positive for appetite loss, sweats HEENT: negative for vision changes, hearing  loss, congestion, rhinorrhea, ST, epistaxis, or sinus pressure Cardiovascular: negative for chest pain or palpitations Respiratory: negative for hemoptysis, wheezing, shortness of breath, or cough Abdominal: negative for abdominal pain, nausea, vomiting, diarrhea, or constipation Dermatological: negative for rash GU: negative for hematuria Neurologic: negative for headache, dizziness, or syncope All other systems reviewed and are otherwise negative with the exception to those above and in the HPI.  PHYSICAL EXAM: Filed Vitals:   05/11/16 1259  BP: 120/76  Pulse: 67  Temp: 98.2 F (36.8 C)  Resp: 16   Body mass index is 28.7 kg/(m^2).   General: Alert cooperative, not ill-appearing HEENT:  Normocephalic,  atraumatic, oropharynx patent. Eye: Juliette Mangle [facility] Cardiovascular:  Grade 1 systolic murmur left sternum border heard when lying down, but not when sitting, Regular rate and rhythm, no rubs or gallops.  No Carotid bruits, radial pulse intact. No pedal edema.  Respiratory: Clear to auscultation bilaterally.  No wheezes, rales, or rhonchi.  No cyanosis, no use of accessory musculature Abdominal: No organomegaly, abdomen is soft and non-tender, positive bowel sounds. No masses. Musculoskeletal: Gait intact. No edema, tenderness Skin: No rashes. Neurologic: Facial musculature symmetric. Psychiatric: Patient acts appropriately throughout our interaction.  Lymphatic: No cervical or submandibular lymphadenopathy Genitourinary/Anorectal: No acute findings  LABS: Results for orders placed or performed in visit on 05/11/16  POCT urinalysis dipstick  Result Value Ref Range   Color, UA yellow yellow   Clarity, UA clear clear   Glucose, UA negative negative   Bilirubin, UA negative negative   Ketones, POC UA negative negative   Spec Grav, UA 1.015    Perry, UA negative negative   pH, UA 7.0    Protein Ur, POC negative negative   Urobilinogen, UA 1.0    Nitrite, UA Negative Negative   Leukocytes, UA Negative Negative  POCT Microscopic Urinalysis ([facility])  Result Value Ref Range   WBC,UR,HPF,POC None None WBC/hpf   RBC,UR,HPF,POC None None RBC/hpf   Bacteria None None, Too numerous to count   Mucus Absent Absent   Epithelial Cells, UR Per Microscopy None None, Too numerous to count cells/hpf  POCT CBC  Result Value Ref Range   WBC 5.5 4.6 - 10.2 K/uL   Lymph, poc 2.0 0.6 - 3.4   POC LYMPH PERCENT 35.9 10 - 50 %L   MID (cbc) 0.3 0 - 0.9   POC MID % 4.6 0 - 12 %M   POC Granulocyte 3.3 2 - 6.9   Granulocyte percent 59.5 37 - 80 %G   RBC 5.65 4.69 - 6.13 M/uL   Hemoglobin 12.8 (A) 14.1 - 18.1 g/dL   HCT, POC 37.7 (A) 43.5 - 53.7 %   MCV 66.8 (A) 80 - 97 fL   MCH, POC 22.7 (A) 27 - 31.2 pg     MCHC 34.0 31.8 - 35.4 g/dL   RDW, POC 15.6 %   Platelet Count, POC 286 142 - 424 K/uL   MPV 7.6 0 - 99.8 fL     EKG/XRAY:     ASSESSMENT/PLAN: Patient looks much better than when I last saw him. His illness appears to be resolving. Liver function tests are improved. I did add EBV titers. His Monospot in the hospital was negative. CMV was negative. He is due to see a urologist at [facility] on Thursday.I personally performed the services described in this documentation, which was scribed in my presence. The recorded information has been reviewed and is accurate.  Gross sideeffects, risk and benefits, and alternatives of  medications d/w patient. Patient is aware that all medications have potential sideeffects and we are unable to predict every sideeffect or drug-drug interaction that may occur.  [name] MD 05/11/2016 1:46 PM

## 2016-05-12 LAB — EPSTEIN-BARR VIRUS VCA ANTIBODY PANEL: EBV NA IGG: 532 U/mL — AB (ref <18.0)

## 2016-05-13 LAB — QUANTIFERON TB GOLD ASSAY (BLOOD)
INTERFERON GAMMA RELEASE ASSAY: NEGATIVE
MITOGEN-NIL SO: 5.29 [IU]/mL
Quantiferon Nil Value: 0.02 IU/mL

## 2016-05-21 NOTE — ED Provider Notes (Signed)
CSN: [id number]     Arrival date & time 05/07/16  1209 History   First MD Initiated Contact with Patient 05/07/16 1250     Chief Complaint  Patient presents with  . Hematuria     (Consider location/radiation/quality/duration/timing/severity/associated sxs/prior Treatment) HPI   [age]-year-old male with hematuria. Onset around 1700 yesterday. He was evaluated at [facility] and discharged. He tried to follow-up with his urologist but has been able to do so thus far. He felt like he was having difficulty voiding so came the emergency room. He has been able to void since that time though. Denies any pain or dysuria. No fevers or chills. No back pain. No blood thinners.  Past Medical History  Diagnosis Date  . Hypertension   . Prostate cancer ([place])   . Chronic kidney disease   . Paget disease of bone    Past Surgical History  Procedure Laterality Date  . Hydrocele excision  2012  . Finger ganglion cyst excision  2011    3rd finger right hand   Family History  Problem Relation Age of Onset  . Hypertension Sister   . Hypertension Brother   . Colon cancer Neg Hx   . Hypertension Mother   . Hypertension Brother   . Stroke Sister   . Hypertension Sister    Social History  Substance Use Topics  . Smoking status: Never Smoker   . Smokeless tobacco: Never Used  . Alcohol Use: No    Review of Systems  All systems reviewed and negative, other than as noted in HPI.   Allergies  Latex; Lisinopril; Neuromuscular blocking agents; Pyridium; and Sulfonamide derivatives  Home Medications   Prior to Admission medications   Medication Sig Start Date End Date Taking? Authorizing Provider  alfuzosin (UROXATRAL) 10 MG 24 hr tablet TAKE 1 TABLET (10 MG TOTAL) BY MOUTH DAILY WITH BREAKFAST Patient taking differently: Take 10 mg by mouth at bedtime.  01/21/16  Yes [name], MD  diltiazem (TIAZAC) 180 MG 24 hr capsule TAKE 1 CAPSULE (240 MG TOTAL) BY MOUTH DAILY. Patient taking  differently: Take 180 mg by mouth at bedtime.  01/21/16  Yes [name], MD  ibuprofen (ADVIL,MOTRIN) 200 MG tablet Take 400 mg by mouth every 6 (six) hours as needed (pain).   Yes Historical Provider, MD  losartan-hydrochlorothiazide (HYZAAR) 100-12.5 MG tablet Take 1 tablet by mouth daily. 01/21/16  Yes [name], MD   BP 163/83 mmHg  Pulse 51  Temp(Src) 98.2 F (36.8 C) (Oral)  Resp 18  Ht 6\' 2"  (1.88 m)  Wt 222 lb (100.699 kg)  BMI 28.49 kg/m2  SpO2 100% Physical Exam  Constitutional: He appears well-developed and well-nourished. No distress.  HENT:  Head: Normocephalic and atraumatic.  Eyes: Conjunctivae are normal. Right eye exhibits no discharge. Left eye exhibits no discharge.  Neck: Neck supple.  Cardiovascular: Normal rate, regular rhythm and normal heart sounds.  Exam reveals no gallop and no friction rub.   No murmur heard. Pulmonary/Chest: Effort normal and breath sounds normal. No respiratory distress.  Abdominal: Soft. He exhibits no distension. There is no tenderness.  Genitourinary:  Normal external male genitalia. No concerning lesions noted. There is a small amount of blood noted at the meatus. No clot.  Musculoskeletal: He exhibits no edema or tenderness.  Neurological: He is alert.  Skin: Skin is warm and dry.  Psychiatric: He has a normal mood and affect. His behavior is normal. Thought content normal.  Nursing note  and vitals reviewed.   ED Course  Procedures (including critical care time) Labs Review Labs Reviewed  URINALYSIS, ROUTINE W REFLEX MICROSCOPIC (NOT AT [facility]) - Abnormal; Notable for the following:    Color, Urine RED (*)    APPearance TURBID (*)    Hgb urine dipstick LARGE (*)    Ketones, ur 15 (*)    Protein, ur 30 (*)    Leukocytes, UA SMALL (*)    All other components within normal limits  URINE MICROSCOPIC-ADD ON - Abnormal; Notable for the following:    Squamous Epithelial / LPF 0-5 (*)    Bacteria, UA FEW (*)    All  other components within normal limits  URINE CULTURE    Imaging Review No results found. I have personally reviewed and evaluated these images and lab results as part of my medical decision-making.   EKG Interpretation None      MDM   Final diagnoses:  Hematuria    70 year old male with hematuria which she is understandably concerned about, but he can urinate and he does not appear to have a urinary tract infection. He is hemodynamically stable. There is no further emergent intervention or evaluation needed at this time. Advise a follow-up with urology. Return precautions were discussed.    Virgel Manifold, MD 05/21/16 2122

## 2016-05-25 ENCOUNTER — Other Ambulatory Visit (INDEPENDENT_AMBULATORY_CARE_PROVIDER_SITE_OTHER): Payer: BC Managed Care – PPO

## 2016-05-25 DIAGNOSIS — R7989 Other specified abnormal findings of blood chemistry: Secondary | ICD-10-CM

## 2016-05-25 DIAGNOSIS — R945 Abnormal results of liver function studies: Principal | ICD-10-CM

## 2016-05-25 LAB — HEPATIC FUNCTION PANEL
ALBUMIN: 3.8 g/dL (ref 3.5–5.2)
ALT: 28 U/L (ref 0–53)
AST: 20 U/L (ref 0–37)
Alkaline Phosphatase: 204 U/L — ABNORMAL HIGH (ref 39–117)
BILIRUBIN DIRECT: 0.3 mg/dL (ref 0.0–0.3)
TOTAL PROTEIN: 7.1 g/dL (ref 6.0–8.3)
Total Bilirubin: 0.8 mg/dL (ref 0.2–1.2)

## 2016-06-02 NOTE — Progress Notes (Signed)
Quick Note:  Given that info then I do not think he needs repeat LFT's or a f/u visit ______

## 2016-06-02 NOTE — Progress Notes (Signed)
Quick Note:  Liver tests almost all back to normal Please have him repeat LFT's in about 1 month and see me shortly after that ______

## 2016-06-28 NOTE — Progress Notes (Signed)
Cardiology Office Note   Date:  07/01/2016   ID:  Jeffrey, Perry 12-27-1945, MRN [id number]  PCP:  Jeffrey Ray DAVID, MD  Cardiologist:   Minus Breeding, MD   Chief Complaint  Patient presents with  . Heart Murmur      History of Present Illness: Jeffrey Perry is a 70 y.o. male who presents for evaluation of HTN and a heart murmur.    He has no prior cardiac history. He has had recent problems with abdominal pain that was diagnosed after an extensive workup at [facility].  He had hematuria. He eventually underwent cystourethroscopy with fulguration of the trigone, bladder neck, prostatic fossa, urethra and periurethral glands. This was done at [facility]. He was laid up on this and hasn't been walking.  Recently he was noted to have a murmur. He never been told about this before and has never had any workup. He's been walking 5 miles per day before getting sick. He's not had any symptoms. The patient denies any new symptoms such as chest discomfort, neck or arm discomfort. There has been no new shortness of breath, PND or orthopnea. There have been no reported palpitations, presyncope or syncope.  Past Medical History:  Diagnosis Date  . Hypertension   . Paget disease of bone    Questionable diagnosis.   . Prostate cancer [facility])     Past Surgical History:  Procedure Laterality Date  . FINGER GANGLION CYST EXCISION  2011   3rd finger right hand  . HYDROCELE EXCISION  2012     Current Outpatient Prescriptions  Medication Sig Dispense Refill  . alfuzosin (UROXATRAL) 10 MG 24 hr tablet TAKE 1 TABLET (10 MG TOTAL) BY MOUTH DAILY WITH BREAKFAST (Patient taking differently: Take 10 mg by mouth at bedtime. ) 90 tablet 1  . diltiazem (TIAZAC) 180 MG 24 hr capsule TAKE 1 CAPSULE (240 MG TOTAL) BY MOUTH DAILY. (Patient taking differently: Take 180 mg by mouth at bedtime. ) 90 capsule 1  . losartan-hydrochlorothiazide (HYZAAR) 100-12.5 MG tablet Take 1 tablet by mouth daily. 90 tablet 1   No  current facility-administered medications for this visit.     Allergies:   Latex; Lisinopril; Neuromuscular blocking agents; Pyridium [phenazopyridine hcl]; and Sulfonamide derivatives    Social History:  The patient  reports that he has never smoked. He has never used smokeless tobacco. He reports that he does not drink alcohol or use drugs.   Family History:  The patient's family history includes Hypertension in his brother, brother, mother, sister, and sister; Stroke in his sister.    ROS:  Please see the history of present illness.   Otherwise, review of systems are positive for none.   All other systems are reviewed and negative.    PHYSICAL EXAM: VS:  BP (!) 120/54   Pulse 66   Ht 6\' 1"  (1.854 m)   Wt 235 lb (106.6 kg)   BMI 31.00 kg/m  , BMI Body mass index is 31 kg/m. GENERAL:  Well appearing HEENT:  Pupils equal round and reactive, fundi not visualized, oral mucosa unremarkable NECK:  No jugular venous distention, waveform within normal limits, carotid upstroke brisk and symmetric, no bruits, no thyromegaly LYMPHATICS:  No cervical, inguinal adenopathy LUNGS:  Clear to auscultation bilaterally BACK:  No CVA tenderness CHEST:  Unremarkable HEART:  PMI not displaced or sustained,S1 and S2 within normal limits, no S3, no S4, no clicks, no rubs, 2 out of 6 apical and left upper sternal border  early peaking systolic murmur, not increasing Valsalva, murmurs ABD:  Flat, positive bowel sounds normal in frequency in pitch, no bruits, no rebound, no guarding, no midline pulsatile mass, no hepatomegaly, no splenomegaly EXT:  2 plus pulses throughout, no edema, no cyanosis no clubbing SKIN:  No rashes no nodules NEURO:  Cranial nerves II through XII grossly intact, motor grossly intact throughout PSYCH:  Cognitively intact, oriented to person place and time    EKG:  EKG is not ordered today. The ekg ordered 04/30/16 demonstrates sinus rhythm, rate 70, LVH with repolarization  changes.     Recent Labs: 04/30/2016: TSH 1.180 05/02/2016: BUN 8; Potassium 3.8; Sodium 134 05/03/2016: Creatinine, Ser 1.23; Platelets 106 05/11/2016: Hemoglobin 12.8 05/25/2016: ALT 28    Lipid Panel    Component Value Date/Time   CHOL 130 07/14/2015 1723   TRIG 71 07/14/2015 1723   HDL 36 (L) 07/14/2015 1723   CHOLHDL 3.6 07/14/2015 1723   VLDL 14 07/14/2015 1723   LDLCALC 80 07/14/2015 1723      Wt Readings from Last 3 Encounters:  07/01/16 235 lb (106.6 kg)  05/11/16 223 lb 9.6 oz (101.4 kg)  05/07/16 222 lb (100.7 kg)      Other studies Reviewed: Additional studies/ records that were reviewed today include: Care Everywhere. Review of the above records demonstrates:  Please see elsewhere in the note.     ASSESSMENT AND PLAN:  MURMUR:  I suspect that this is aortic sclerosis. I don't think further imaging is indicated. He has no symptoms. His exam is unremarkable. I would like to see him back in one year further evaluate and listen to his heart again.  HTN:  He reports his blood pressures actually been running low. His weights up a little bit because he is not exercising as backup. However, it runs low in the future I suggested switching from Hyzaar to Cozaar.   Current medicines are reviewed at length with the patient today.  The patient does not have concerns regarding medicines.  The following changes have been made:  no change  Labs/ tests ordered today include: None No orders of the defined types were placed in this encounter.    Disposition:   FU with one year.     Signed, Minus Breeding, MD  07/01/2016 10:40 AM    [place]

## 2016-07-01 ENCOUNTER — Ambulatory Visit (INDEPENDENT_AMBULATORY_CARE_PROVIDER_SITE_OTHER): Payer: BC Managed Care – PPO | Admitting: Cardiology

## 2016-07-01 ENCOUNTER — Encounter: Payer: Self-pay | Admitting: Cardiology

## 2016-07-01 VITALS — BP 120/54 | HR 66 | Ht 73.0 in | Wt 235.0 lb

## 2016-07-01 DIAGNOSIS — I10 Essential (primary) hypertension: Secondary | ICD-10-CM | POA: Diagnosis not present

## 2016-07-01 DIAGNOSIS — R011 Cardiac murmur, unspecified: Secondary | ICD-10-CM | POA: Diagnosis not present

## 2016-07-01 NOTE — Patient Instructions (Signed)
Your physician wants you to follow-up in: 1 Year. You will receive a reminder letter in the mail two months in advance. If you don't receive a letter, please call our office to schedule the follow-up appointment.  

## 2016-07-21 ENCOUNTER — Ambulatory Visit: Payer: BC Managed Care – PPO | Admitting: Family Medicine

## 2016-07-22 ENCOUNTER — Ambulatory Visit (INDEPENDENT_AMBULATORY_CARE_PROVIDER_SITE_OTHER): Payer: BC Managed Care – PPO | Admitting: Family Medicine

## 2016-07-22 ENCOUNTER — Encounter: Payer: Self-pay | Admitting: Family Medicine

## 2016-07-22 VITALS — BP 126/84 | HR 60 | Temp 97.8°F | Resp 18 | Ht 73.0 in | Wt 236.0 lb

## 2016-07-22 DIAGNOSIS — I10 Essential (primary) hypertension: Secondary | ICD-10-CM | POA: Diagnosis not present

## 2016-07-22 DIAGNOSIS — K5900 Constipation, unspecified: Secondary | ICD-10-CM | POA: Diagnosis not present

## 2016-07-22 DIAGNOSIS — R945 Abnormal results of liver function studies: Principal | ICD-10-CM

## 2016-07-22 DIAGNOSIS — Z8546 Personal history of malignant neoplasm of prostate: Secondary | ICD-10-CM

## 2016-07-22 DIAGNOSIS — Z13220 Encounter for screening for lipoid disorders: Secondary | ICD-10-CM | POA: Diagnosis not present

## 2016-07-22 DIAGNOSIS — R7989 Other specified abnormal findings of blood chemistry: Secondary | ICD-10-CM

## 2016-07-22 LAB — LIPID PANEL
Cholesterol: 132 mg/dL (ref 125–200)
HDL: 35 mg/dL — AB (ref >=40)
LDL Cholesterol: 81 mg/dL (ref <130)
Total CHOL/HDL Ratio: 3.8 Ratio (ref <=5.0)
Triglycerides: 81 mg/dL (ref <150)
VLDL: 16 mg/dL (ref <30)

## 2016-07-22 LAB — COMPLETE METABOLIC PANEL WITH GFR
ALBUMIN: 4.1 g/dL (ref 3.6–5.1)
ALK PHOS: 196 U/L — AB (ref 40–115)
ALT: 16 U/L (ref 9–46)
AST: 20 U/L (ref 10–35)
BILIRUBIN TOTAL: 0.7 mg/dL (ref 0.2–1.2)
BUN: 16 mg/dL (ref 7–25)
CO2: 24 mmol/L (ref 20–31)
CREATININE: 1.26 mg/dL — AB (ref 0.70–1.25)
Calcium: 9 mg/dL (ref 8.6–10.3)
Chloride: 106 mmol/L (ref 98–110)
GFR, EST AFRICAN AMERICAN: 67 mL/min (ref >=60)
GFR, EST NON AFRICAN AMERICAN: 58 mL/min — AB (ref >=60)
GLUCOSE: 95 mg/dL (ref 65–99)
Potassium: 3.7 mmol/L (ref 3.5–5.3)
SODIUM: 136 mmol/L (ref 135–146)
TOTAL PROTEIN: 6.9 g/dL (ref 6.1–8.1)

## 2016-07-22 LAB — PSA: PSA: 0.4 ng/mL (ref <=4.0)

## 2016-07-22 MED ORDER — DILTIAZEM HCL ER BEADS 180 MG PO CP24: 180.0000 mg | ORAL_CAPSULE | Freq: Every day | ORAL | 1 refills | Status: DC

## 2016-07-22 MED ORDER — LOSARTAN POTASSIUM-HCTZ 100-12.5 MG PO TABS: 1.0000 | ORAL_TABLET | Freq: Every day | ORAL | 1 refills | Status: DC

## 2016-07-22 NOTE — Progress Notes (Signed)
Subjective:  By signing my name below, I, Moises Blood, attest that this documentation has been prepared under the direction and in the presence of [name], MD. Electronically Signed: Moises Blood, [place]. 07/22/2016 , 8:38 AM .  Patient was seen in Room 1 .   Patient ID: Jeffrey Perry, male    DOB: December 31, 1945, 70 y.o.   MRN: [id number] Chief Complaint  Patient presents with  . Follow-up    6 month   HPI Jeffrey Perry is a 70 y.o. male Here for follow up.   HTN Last seen on Feb 22; at that time, BP was controlled.   He hasn't been checking his BP at home. He denies shortness of breath, or chest pain. He denies complication with his medications.   Lab Results  Component Value Date   CREATININE 1.23 05/03/2016   Elevated LFT's and abdominal pain He was admitted June 4th. He had a MRCP that was unremarkable. There was some gall bladder distention but no obstruction. And his LFT's were down-turning on discharge, jaundice resolving. Plan for GI follow up. Seen and follow up on June 13th and was improving at that time.   Lab Results  Component Value Date   ALT 28 05/25/2016   AST 20 05/25/2016   ALKPHOS 204 (H) 05/25/2016   BILITOT 0.8 05/25/2016   History of prostate cancer Followed by Dr. Janice Norrie. He was evaluated in the ED on June 9th for hematuria. He was advised to follow up with his urologist. Last PSA was done in February.   He reports having an itching sensation inside his bladder. He's been advised to follow up with urologist if it continues. He notes constipation recently, and has taken miralax. He denies blood in his stool.   Patient Active Problem List   Diagnosis Date Noted  . Jaundice 05/03/2016  . Elevated LFTs   . Abdominal pain 04/30/2016  . Renal insufficiency 03/05/2013  . HTN (hypertension) 03/06/2012  . Prostate cancer ([place]) 10/04/2011   Past Medical History:  Diagnosis Date  . Hypertension   . Paget disease of bone    Questionable diagnosis.     . Prostate cancer [facility])    Past Surgical History:  Procedure Laterality Date  . FINGER GANGLION CYST EXCISION  2011   3rd finger right hand  . HYDROCELE EXCISION  2012   Allergies  Allergen Reactions  . Latex Itching  . Lisinopril Cough  . Neuromuscular Blocking Agents Swelling    Ankle swelling  . Pyridium [Phenazopyridine Hcl] Swelling    Ankle swelling  . Sulfonamide Derivatives Itching   Prior to Admission medications   Medication Sig Start Date End Date Taking? Authorizing Provider  alfuzosin (UROXATRAL) 10 MG 24 hr tablet TAKE 1 TABLET (10 MG TOTAL) BY MOUTH DAILY WITH BREAKFAST Patient taking differently: Take 10 mg by mouth at bedtime.  01/21/16   [name], MD  diltiazem (TIAZAC) 180 MG 24 hr capsule TAKE 1 CAPSULE (240 MG TOTAL) BY MOUTH DAILY. Patient taking differently: Take 180 mg by mouth at bedtime.  01/21/16   [name], MD  losartan-hydrochlorothiazide (HYZAAR) 100-12.5 MG tablet Take 1 tablet by mouth daily. 01/21/16   [name], MD   Social History   Social History  . Marital status: Married    Spouse name: [name]  . Number of children: [name]  . Years of education: [name]   Occupational History  . professor [employer]   Social History Main Topics  . Smoking status:  Never Smoker  . Smokeless tobacco: Never Used  . Alcohol use No  . Drug use: No  . Sexual activity: Yes    Partners: Female   Other Topics Concern  . Not on file   Social History Narrative  . No narrative on file   Review of Systems  Constitutional: Negative for fatigue and unexpected weight change.  Eyes: Negative for visual disturbance.  Respiratory: Negative for cough, chest tightness and shortness of breath.   Cardiovascular: Negative for chest pain, palpitations and leg swelling.  Gastrointestinal: Positive for constipation. Negative for abdominal pain and blood in stool.  Neurological: Negative for dizziness, light-headedness and headaches.       Objective:    Physical Exam  Constitutional: He is oriented to person, place, and time. He appears well-developed and well-nourished.  HENT:  Head: Normocephalic and atraumatic.  Eyes: EOM are normal. Pupils are equal, round, and reactive to light.  Neck: No JVD present. Carotid bruit is not present.  Cardiovascular: Normal rate, regular rhythm and normal heart sounds.   No murmur heard. Pulmonary/Chest: Effort normal and breath sounds normal. He has no rales.  Musculoskeletal: He exhibits no edema.  Neurological: He is alert and oriented to person, place, and time.  Skin: Skin is warm and dry.  Psychiatric: He has a normal mood and affect.  Vitals reviewed.   Vitals:   07/22/16 0816  BP: 126/84  Pulse: 60  Resp: 18  Temp: 97.8 F (36.6 C)  TempSrc: Oral  SpO2: 99%  Weight: 236 lb (107 kg)  Height: 6\' 1"  (1.854 m)      Assessment & Plan:   Jeffrey Perry is a 70 y.o. male Elevated LFTs - Plan: COMPLETE METABOLIC PANEL WITH GFR  - See prior hospitalization. Had been improving. Will repeat LFTs to make sure these are stable.  Essential hypertension - Plan: diltiazem (TIAZAC) 180 MG 24 hr capsule, losartan-hydrochlorothiazide (HYZAAR) 100-12.5 MG tablet  -Stable. Tolerating current medications. No changes.  Constipation, unspecified constipation type  -Fiber in diet, and other preventative measures discussed. Can try Colace over-the-counter or MiraLAX if needed for more significant symptoms. RTC precautions if worse  History of prostate cancer - Plan: PSA  -Agreed to check PSA, but follow-up with urologist as planned  Screening for hyperlipidemia - Plan: COMPLETE METABOLIC PANEL WITH GFR, Lipid panel   Meds ordered this encounter  Medications  . diltiazem (TIAZAC) 180 MG 24 hr capsule    Sig: Take 1 capsule (180 mg total) by mouth at bedtime.    Dispense:  90 capsule    Refill:  1  . losartan-hydrochlorothiazide (HYZAAR) 100-12.5 MG tablet    Sig: Take 1 tablet by mouth daily.     Dispense:  90 tablet    Refill:  1   Patient Instructions    No change in medications for now. See information below on constipation. Follow-up if that is not improving. I will check a PSA level, but that can be followed up with your urologist. Follow-up in 6 months for physical.  Constipation, Adult Constipation is when a person has fewer than three bowel movements a week, has difficulty having a bowel movement, or has stools that are dry, hard, or larger than normal. As people grow older, constipation is more common. A low-fiber diet, not taking in enough fluids, and taking certain medicines may make constipation worse.  CAUSES   Certain medicines, such as antidepressants, pain medicine, iron supplements, antacids, and water pills.   Certain diseases, such  as diabetes, irritable bowel syndrome (IBS), thyroid disease, or depression.   Not drinking enough water.   Not eating enough fiber-rich foods.   Stress or travel.   Lack of physical activity or exercise.   Ignoring the urge to have a bowel movement.   Using laxatives too much.  SIGNS AND SYMPTOMS   Having fewer than three bowel movements a week.   Straining to have a bowel movement.   Having stools that are hard, dry, or larger than normal.   Feeling full or bloated.   Pain in the lower abdomen.   Not feeling relief after having a bowel movement.  DIAGNOSIS  Your health care provider will take a medical history and perform a physical exam. Further testing may be done for severe constipation. Some tests may include:  A barium enema X-ray to examine your rectum, colon, and, sometimes, your small intestine.   A sigmoidoscopy to examine your lower colon.   A colonoscopy to examine your entire colon. TREATMENT  Treatment will depend on the severity of your constipation and what is causing it. Some dietary treatments include drinking more fluids and eating more fiber-rich foods. Lifestyle treatments may  include regular exercise. If these diet and lifestyle recommendations do not help, your health care provider may recommend taking over-the-counter laxative medicines to help you have bowel movements. Prescription medicines may be prescribed if over-the-counter medicines do not work.  HOME CARE INSTRUCTIONS   Eat foods that have a lot of fiber, such as fruits, vegetables, whole grains, and beans.  Limit foods high in fat and processed sugars, such as french fries, hamburgers, cookies, candies, and soda.   A fiber supplement may be added to your diet if you cannot get enough fiber from foods.   Drink enough fluids to keep your urine clear or pale yellow.   Exercise regularly or as directed by your health care provider.   Go to the restroom when you have the urge to go. Do not hold it.   Only take over-the-counter or prescription medicines as directed by your health care provider. Do not take other medicines for constipation without talking to your health care provider first.  [place] IF:   You have bright red blood in your stool.   Your constipation lasts for more than 4 days or gets worse.   You have abdominal or rectal pain.   You have thin, pencil-like stools.   You have unexplained weight loss. MAKE SURE YOU:   Understand these instructions.  Will watch your condition.  Will get help right away if you are not doing well or get worse.   This information is not intended to replace advice given to you by your health care provider. Make sure you discuss any questions you have with your health care provider.   Document Released: 08/13/2004 Document Revised: 12/06/2014 Document Reviewed: 08/27/2013 Elsevier Interactive Patient Education 2016 [employer].   IF you received an x-ray today, you will receive an invoice from [facility]. Please contact [facility] at [phone] with questions or concerns regarding your invoice.    IF you received labwork today, you will receive an invoice from [employer]. Please contact [facility] at [phone] with questions or concerns regarding your invoice.   Our billing staff will not be able to assist you with questions regarding bills from these companies.  You will be contacted with the lab results as soon as they are available. The fastest way to get your  results is to activate your My Chart account. Instructions are located on the last page of this paperwork. If you have not heard from [place] regarding the results in 2 weeks, please contact this office.       I personally performed the services described in this documentation, which was scribed in my presence. The recorded information has been reviewed and considered, and addended by me as needed.   Signed,   [name], MD [facility].  07/26/16 12:11 PM

## 2016-07-22 NOTE — Patient Instructions (Addendum)
No change in medications for now. See information below on constipation. Follow-up if that is not improving. I will check a PSA level, but that can be followed up with your urologist. Follow-up in 6 months for physical.  Constipation, Adult Constipation is when a person has fewer than three bowel movements a week, has difficulty having a bowel movement, or has stools that are dry, hard, or larger than normal. As people grow older, constipation is more common. A low-fiber diet, not taking in enough fluids, and taking certain medicines may make constipation worse.  CAUSES   Certain medicines, such as antidepressants, pain medicine, iron supplements, antacids, and water pills.   Certain diseases, such as diabetes, irritable bowel syndrome (IBS), thyroid disease, or depression.   Not drinking enough water.   Not eating enough fiber-rich foods.   Stress or travel.   Lack of physical activity or exercise.   Ignoring the urge to have a bowel movement.   Using laxatives too much.  SIGNS AND SYMPTOMS   Having fewer than three bowel movements a week.   Straining to have a bowel movement.   Having stools that are hard, dry, or larger than normal.   Feeling full or bloated.   Pain in the lower abdomen.   Not feeling relief after having a bowel movement.  DIAGNOSIS  Your health care provider will take a medical history and perform a physical exam. Further testing may be done for severe constipation. Some tests may include:  A barium enema X-ray to examine your rectum, colon, and, sometimes, your small intestine.   A sigmoidoscopy to examine your lower colon.   A colonoscopy to examine your entire colon. TREATMENT  Treatment will depend on the severity of your constipation and what is causing it. Some dietary treatments include drinking more fluids and eating more fiber-rich foods. Lifestyle treatments may include regular exercise. If these diet and lifestyle  recommendations do not help, your health care provider may recommend taking over-the-counter laxative medicines to help you have bowel movements. Prescription medicines may be prescribed if over-the-counter medicines do not work.  HOME CARE INSTRUCTIONS   Eat foods that have a lot of fiber, such as fruits, vegetables, whole grains, and beans.  Limit foods high in fat and processed sugars, such as french fries, hamburgers, cookies, candies, and soda.   A fiber supplement may be added to your diet if you cannot get enough fiber from foods.   Drink enough fluids to keep your urine clear or pale yellow.   Exercise regularly or as directed by your health care provider.   Go to the restroom when you have the urge to go. Do not hold it.   Only take over-the-counter or prescription medicines as directed by your health care provider. Do not take other medicines for constipation without talking to your health care provider first.  [place] IF:   You have bright red blood in your stool.   Your constipation lasts for more than 4 days or gets worse.   You have abdominal or rectal pain.   You have thin, pencil-like stools.   You have unexplained weight loss. MAKE SURE YOU:   Understand these instructions.  Will watch your condition.  Will get help right away if you are not doing well or get worse.   This information is not intended to replace advice given to you by your health care provider. Make sure you discuss any questions you have with your health care provider.  Document Released: 08/13/2004 Document Revised: 12/06/2014 Document Reviewed: 08/27/2013 Elsevier Interactive Patient Education 2016 [employer].   IF you received an x-ray today, you will receive an invoice from [facility]. Please contact [facility] at [phone] with questions or concerns regarding your invoice.   IF you received labwork today, you will receive an  invoice from [employer]. Please contact [facility] at [phone] with questions or concerns regarding your invoice.   Our billing staff will not be able to assist you with questions regarding bills from these companies.  You will be contacted with the lab results as soon as they are available. The fastest way to get your results is to activate your My Chart account. Instructions are located on the last page of this paperwork. If you have not heard from [place] regarding the results in 2 weeks, please contact this office.

## 2016-08-13 ENCOUNTER — Other Ambulatory Visit: Payer: Self-pay | Admitting: Family Medicine

## 2016-08-13 DIAGNOSIS — I10 Essential (primary) hypertension: Secondary | ICD-10-CM

## 2016-08-19 MED ORDER — ALFUZOSIN HCL ER 10 MG PO TB24: ORAL_TABLET | ORAL | 1 refills | Status: DC

## 2016-08-19 NOTE — Addendum Note (Signed)
Addended by: Elwyn Reach A on: 08/19/2016 10:19 AM   Modules accepted: Orders

## 2016-08-19 NOTE — Telephone Encounter (Signed)
Pt called from pharm asking why the RF req for alfuzosin was denied. Reviewed last OV notes within the month and do not know why it was denied. I don't see any reason to deny this per protocol. Sent in RFs.

## 2017-01-23 ENCOUNTER — Other Ambulatory Visit: Payer: Self-pay | Admitting: Family Medicine

## 2017-01-23 DIAGNOSIS — I10 Essential (primary) hypertension: Secondary | ICD-10-CM

## 2017-01-24 ENCOUNTER — Other Ambulatory Visit: Payer: Self-pay | Admitting: Family Medicine

## 2017-01-24 DIAGNOSIS — I10 Essential (primary) hypertension: Secondary | ICD-10-CM

## 2017-01-24 NOTE — Telephone Encounter (Signed)
Meds ordered this encounter  Medications  . losartan-hydrochlorothiazide (HYZAAR) 100-12.5 MG tablet    Sig: TAKE ONE TABLET BY MOUTH DAILY    Dispense:  90 tablet    Refill:  0  . diltiazem (TIAZAC) 180 MG 24 hr capsule    Sig: TAKE ONE CAPSULE BY MOUTH EVERY NIGHT AT BEDTIME    Dispense:  90 capsule    Refill:  0    Patient notified via My Chart. Needs visit for additional fills.

## 2017-02-17 ENCOUNTER — Ambulatory Visit: Payer: BC Managed Care – PPO | Admitting: Family Medicine

## 2017-02-21 ENCOUNTER — Ambulatory Visit (INDEPENDENT_AMBULATORY_CARE_PROVIDER_SITE_OTHER): Payer: BC Managed Care – PPO | Admitting: Family Medicine

## 2017-02-21 ENCOUNTER — Encounter: Payer: Self-pay | Admitting: Family Medicine

## 2017-02-21 VITALS — BP 130/84 | HR 67 | Temp 98.4°F | Resp 16 | Ht 72.0 in | Wt 236.6 lb

## 2017-02-21 DIAGNOSIS — I10 Essential (primary) hypertension: Secondary | ICD-10-CM

## 2017-02-21 DIAGNOSIS — R7989 Other specified abnormal findings of blood chemistry: Secondary | ICD-10-CM

## 2017-02-21 DIAGNOSIS — R748 Abnormal levels of other serum enzymes: Secondary | ICD-10-CM

## 2017-02-21 DIAGNOSIS — Z13220 Encounter for screening for lipoid disorders: Secondary | ICD-10-CM | POA: Diagnosis not present

## 2017-02-21 MED ORDER — LOSARTAN POTASSIUM-HCTZ 100-12.5 MG PO TABS: 1.0000 | ORAL_TABLET | Freq: Every day | ORAL | 2 refills | Status: DC

## 2017-02-21 MED ORDER — DILTIAZEM HCL ER BEADS 180 MG PO CP24: 180.0000 mg | ORAL_CAPSULE | Freq: Every day | ORAL | 2 refills | Status: DC

## 2017-02-21 NOTE — Progress Notes (Signed)
By signing my name below, I, Mesha Guinyard, attest that this documentation has been prepared under the direction and in the presence of [name], MD.  Electronically Signed: Verlee Monte, Medical Scribe. 02/21/17. 10:59 AM.  Subjective:    Patient ID: Jeffrey Perry, male    DOB: [birth year], 71 y.o.   MRN: [id number]  HPI Chief Complaint  Patient presents with  . Hypertension    HPI Comments: Jeffrey Perry is a 71 y.o. male who presents to the Primary Care at [facility] for HTN follow-up. Last visit with me was in Aug 2017. Was tolerating medications at that time. No change in regimen.  Elevated LFTs: See last visit. Had been evaluated by GI. Alkaline phosphatse elevation evaluated by rheumatology, no follow-up needed at this time. Denies abdominal pain. Lab Results  Component Value Date   ALT 16 07/22/2016   AST 20 07/22/2016   ALKPHOS 196 (H) 07/22/2016   BILITOT 0.7 07/22/2016   HTN: Takes hyzaar 100-12.5 mg QD, and tiazac 180 mg QHS.  Pt went to a cardiologist Jul 01, 2016 after being dx with a heart murmer probably aortic sclerosis, Dr. Percival Spanish. He does not check his bp at home. The bursitis in his hip has prevented him from exercising as much as he would like. Denies experiencing chest pain, SOB, dizziness, or other side effects from his medication. Lab Results  Component Value Date   CREATININE 1.26 (H) 07/22/2016   BP Readings from Last 3 Encounters:  02/21/17 130/84  07/22/16 126/84  07/01/16 (!) 120/54   Prostate CA: He is followed by [facility] Dr. Keene Breath, last visit Nov 16th 2017. He has been treated with alfuzosin for incomplete emptying with elevated PVR. Pt has a follow-up appt with urology in 3 days.  Patient Active Problem List   Diagnosis Date Noted  . Jaundice 05/03/2016  . Elevated LFTs   . Abdominal pain 04/30/2016  . Renal insufficiency 03/05/2013  . HTN (hypertension) 03/06/2012  . Prostate cancer ([place]) 10/04/2011   Past  Medical History:  Diagnosis Date  . Hypertension   . Paget disease of bone    Questionable diagnosis.   . Prostate cancer [facility])    Past Surgical History:  Procedure Laterality Date  . FINGER GANGLION CYST EXCISION  2011   3rd finger right hand  . HYDROCELE EXCISION  2012   Allergies  Allergen Reactions  . Latex Itching  . Lisinopril Cough  . Neuromuscular Blocking Agents Swelling    Ankle swelling  . Pyridium [Phenazopyridine Hcl] Swelling    Ankle swelling  . Sulfonamide Derivatives Itching   Prior to Admission medications   Medication Sig Start Date End Date Taking? Authorizing Provider  alfuzosin (UROXATRAL) 10 MG 24 hr tablet TAKE ONE TABLET BY MOUTH DAILY 01/25/17   Gelene Mink McVey, PA-C  diltiazem [facility]) 180 MG 24 hr capsule TAKE ONE CAPSULE BY MOUTH EVERY NIGHT AT BEDTIME 01/24/17   Chelle Jeffery, PA-C  losartan-hydrochlorothiazide (HYZAAR) 100-12.5 MG tablet TAKE ONE TABLET BY MOUTH DAILY 01/24/17   Harrison Mons, PA-C   Social History   Social History  . Marital status: Married    Spouse name: [name]  . Number of children: [name]  . Years of education: [name]   Occupational History  . professor [employer]   Social History Main Topics  . Smoking status: Never Smoker  . Smokeless tobacco: Never Used  . Alcohol use No  . Drug use: No  . Sexual activity: Yes  Partners: Female   Other Topics Concern  . Not on file   Social History Narrative  . No narrative on file   Review of Systems  Constitutional: Negative for fatigue and unexpected weight change.  Eyes: Negative for visual disturbance.  Respiratory: Negative for cough, chest tightness and shortness of breath.   Cardiovascular: Negative for chest pain, palpitations and leg swelling.  Gastrointestinal: Negative for abdominal pain and blood in stool.  Neurological: Negative for dizziness, light-headedness and headaches.   Objective:  Physical Exam  Constitutional: He is oriented to person, place, and  time. He appears well-developed and well-nourished.  HENT:  Head: Normocephalic and atraumatic.  Eyes: EOM are normal. Pupils are equal, round, and reactive to light.  Neck: No JVD present. Carotid bruit is not present.  Cardiovascular: Normal rate, regular rhythm and normal heart sounds.  Exam reveals no gallop and no friction rub.   No murmur heard. Pulmonary/Chest: Effort normal and breath sounds normal. No respiratory distress. He has no wheezes. He has no rales.  Musculoskeletal: He exhibits no edema.  Neurological: He is alert and oriented to person, place, and time.  Skin: Skin is warm and dry.  Psychiatric: He has a normal mood and affect.  Vitals reviewed.   Vitals:   02/21/17 1040 02/21/17 1049  BP: (!) 146/86 130/84  Pulse: 67   Resp: 16   Temp: 98.4 F (36.9 C)   TempSrc: Oral   SpO2: 99%   Weight: 236 lb 9.6 oz (107.3 kg)   Height: 6' (1.829 m)    Body mass index is 32.09 kg/m. Assessment & Plan:   Jeffrey Perry is a 71 y.o. male Elevated alkaline phosphatase level - Plan: Comprehensive metabolic panel  - Appears stable, evaluated by rheumatology without any recommended new testing/treatment. Continue to monitor.  Essential hypertension, elevated creatinine- Plan: Comprehensive metabolic panel, diltiazem (TIAZAC) 180 MG 24 hr capsule, losartan-hydrochlorothiazide (HYZAAR) 100-12.5 MG tablet  -Borderline, but stable on repeat testing. Attributes to decreased exercise after hip bursitis. Continue to monitor outside of office, but no change in medications for now. Check BMP as creatinine slightly elevated previously.  Continue routine follow-up with cardiology for previous heart murmur thought to be aortic sclerosis. Asymptomatic.  Screening for hyperlipidemia - Plan: Lipid panel   Meds ordered this encounter  Medications  . diltiazem (TIAZAC) 180 MG 24 hr capsule    Sig: Take 1 capsule (180 mg total) by mouth at bedtime.    Dispense:  90 capsule    Refill:  2  .  losartan-hydrochlorothiazide (HYZAAR) 100-12.5 MG tablet    Sig: Take 1 tablet by mouth daily.    Dispense:  90 tablet    Refill:  2   Patient Instructions    No change in medications for now. Keep an eye on your blood pressure and if remaining over 140/90 outside of office, we may need to adjust medications. Follow-up with cardiology in August, and schedule a physical with me in September or October. Let me know if you have questions in the meantime.   IF you received an x-ray today, you will receive an invoice from [facility]. Please contact [facility] at [phone] with questions or concerns regarding your invoice.   IF you received labwork today, you will receive an invoice from [place]. Please contact [facility] at [phone] with questions or concerns regarding your invoice.   Our billing staff will not be able to assist you with questions regarding bills from these companies.  You will  be contacted with the lab results as soon as they are available. The fastest way to get your results is to activate your My Chart account. Instructions are located on the last page of this paperwork. If you have not heard from [place] regarding the results in 2 weeks, please contact this office.      I personally performed the services described in this documentation, which was scribed in my presence. The recorded information has been reviewed and considered for accuracy and completeness, addended by me as needed, and agree with information above.  Signed,   [name], MD Primary Care at [place].  02/21/17 11:12 AM

## 2017-02-21 NOTE — Patient Instructions (Addendum)
  No change in medications for now. Keep an eye on your blood pressure and if remaining over 140/90 outside of office, we may need to adjust medications. Follow-up with cardiology in August, and schedule a physical with me in September or October. Let me know if you have questions in the meantime.   IF you received an x-ray today, you will receive an invoice from [facility]. Please contact [facility] at [phone] with questions or concerns regarding your invoice.   IF you received labwork today, you will receive an invoice from [place]. Please contact [facility] at [phone] with questions or concerns regarding your invoice.   Our billing staff will not be able to assist you with questions regarding bills from these companies.  You will be contacted with the lab results as soon as they are available. The fastest way to get your results is to activate your My Chart account. Instructions are located on the last page of this paperwork. If you have not heard from [place] regarding the results in 2 weeks, please contact this office.

## 2017-02-22 LAB — LIPID PANEL
CHOL/HDL RATIO: 4.4 ratio (ref 0.0–5.0)
Cholesterol, Total: 151 mg/dL (ref 100–199)
HDL: 34 mg/dL — ABNORMAL LOW (ref >39)
LDL CALC: 91 mg/dL (ref 0–99)
TRIGLYCERIDES: 129 mg/dL (ref 0–149)
VLDL Cholesterol Cal: 26 mg/dL (ref 5–40)

## 2017-02-22 LAB — COMPREHENSIVE METABOLIC PANEL
ALT: 20 IU/L (ref 0–44)
AST: 22 IU/L (ref 0–40)
Albumin/Globulin Ratio: 1.4 (ref 1.2–2.2)
Albumin: 4 g/dL (ref 3.6–4.8)
Alkaline Phosphatase: 257 IU/L — ABNORMAL HIGH (ref 39–117)
BUN/Creatinine Ratio: 12 (ref 10–24)
BUN: 18 mg/dL (ref 8–27)
Bilirubin Total: 0.3 mg/dL (ref 0.0–1.2)
CALCIUM: 9.2 mg/dL (ref 8.6–10.2)
CO2: 24 mmol/L (ref 18–29)
CREATININE: 1.51 mg/dL — AB (ref 0.76–1.27)
Chloride: 104 mmol/L (ref 96–106)
GFR, EST AFRICAN AMERICAN: 54 mL/min/{1.73_m2} — AB (ref >59)
GFR, EST NON AFRICAN AMERICAN: 46 mL/min/{1.73_m2} — AB (ref >59)
GLOBULIN, TOTAL: 2.9 g/dL (ref 1.5–4.5)
Glucose: 82 mg/dL (ref 65–99)
Potassium: 4.3 mmol/L (ref 3.5–5.2)
SODIUM: 141 mmol/L (ref 134–144)
TOTAL PROTEIN: 6.9 g/dL (ref 6.0–8.5)

## 2017-04-09 ENCOUNTER — Other Ambulatory Visit: Payer: Self-pay | Admitting: Physician Assistant

## 2017-04-09 DIAGNOSIS — I10 Essential (primary) hypertension: Secondary | ICD-10-CM

## 2017-04-12 ENCOUNTER — Ambulatory Visit (INDEPENDENT_AMBULATORY_CARE_PROVIDER_SITE_OTHER): Payer: BC Managed Care – PPO | Admitting: Family Medicine

## 2017-04-12 ENCOUNTER — Encounter: Payer: Self-pay | Admitting: Family Medicine

## 2017-04-12 VITALS — BP 126/76 | HR 85 | Temp 98.6°F | Resp 16 | Ht 72.0 in | Wt 240.4 lb

## 2017-04-12 DIAGNOSIS — Z23 Encounter for immunization: Secondary | ICD-10-CM | POA: Diagnosis not present

## 2017-04-12 DIAGNOSIS — Z7189 Other specified counseling: Secondary | ICD-10-CM | POA: Diagnosis not present

## 2017-04-12 DIAGNOSIS — I10 Essential (primary) hypertension: Secondary | ICD-10-CM | POA: Diagnosis not present

## 2017-04-12 DIAGNOSIS — Z7184 Encounter for health counseling related to travel: Secondary | ICD-10-CM

## 2017-04-12 DIAGNOSIS — Z298 Encounter for other specified prophylactic measures: Secondary | ICD-10-CM

## 2017-04-12 MED ORDER — ALFUZOSIN HCL ER 10 MG PO TB24: 10.0000 mg | ORAL_TABLET | Freq: Every day | ORAL | 0 refills | Status: DC

## 2017-04-12 MED ORDER — TYPHOID VACCINE PO CPDR: 1.0000 | DELAYED_RELEASE_CAPSULE | ORAL | 0 refills | Status: DC

## 2017-04-12 MED ORDER — DOXYCYCLINE HYCLATE 100 MG PO TABS: 100.0000 mg | ORAL_TABLET | Freq: Every day | ORAL | 0 refills | Status: DC

## 2017-04-12 NOTE — Progress Notes (Signed)
Subjective:  By signing my name below, I, Essence Howell, attest that this documentation has been prepared under the direction and in the presence of [name], MD Electronically Signed: Ladene Artist, ED Scribe 04/12/2017 at 10:41 AM.   Patient ID: Jeffrey Perry, male    DOB: [birth year], 71 y.o.   MRN: [id number]  Chief Complaint  Patient presents with  . other    Pt is traveling to [place] soon - need prophylatic malaria treatment and anything is that is recommended    HPI  Jeffrey Perry is a 71 y.o. male who presents to Primary Care at [facility] for a malaria treatment. He denies issues with prophylactic treatment in the past. Pt will be traveling to [place] June 4-July 24. His last visit was in March; states he has a property there. No h/o typhoid. Pt denies recent fever, vomiting, diarrhea, recent infections.  Immunization History  Administered Date(s) Administered  . DTaP 03/22/2008  . Gamma Globulin 06/08/1994  . H1N1 10/03/2004  . Hepatitis A 03/29/1996, 06/10/1998  . Hepatitis B 03/22/2008, 10/07/2008  . IPV 03/18/2003  . Meningococcal Conjugate 03/22/2008  . Pneumococcal Conjugate-13 05/02/2010  . Pneumococcal Polysaccharide-23 03/04/2014  . Pneumococcal-Unspecified 05/02/2010  . Tdap 03/22/2008  . Typhoid Parenteral 05/29/2002  . Yellow Fever 03/22/2008  . Zoster 03/29/2010   Patient Active Problem List   Diagnosis Date Noted  . Jaundice 05/03/2016  . Elevated LFTs   . Abdominal pain 04/30/2016  . Renal insufficiency 03/05/2013  . HTN (hypertension) 03/06/2012  . Prostate cancer ([place]) 10/04/2011   Past Medical History:  Diagnosis Date  . Hypertension   . Paget disease of bone    Questionable diagnosis.   . Prostate cancer [facility])    Past Surgical History:  Procedure Laterality Date  . FINGER GANGLION CYST EXCISION  2011   3rd finger right hand  . HYDROCELE EXCISION  2012   Allergies  Allergen Reactions  . Latex Itching  . Lisinopril Cough  .  Neuromuscular Blocking Agents Swelling    Ankle swelling  . Pyridium [Phenazopyridine Hcl] Swelling    Ankle swelling  . Sulfonamide Derivatives Itching   Prior to Admission medications   Medication Sig Start Date End Date Taking? Authorizing Provider  alfuzosin (UROXATRAL) 10 MG 24 hr tablet TAKE ONE TABLET BY MOUTH DAILY 01/25/17   McVey, Gelene Mink, PA-C  diltiazem (TIAZAC) 180 MG 24 hr capsule Take 1 capsule (180 mg total) by mouth at bedtime. 02/21/17   [name], MD  losartan-hydrochlorothiazide (HYZAAR) 100-12.5 MG tablet Take 1 tablet by mouth daily. 02/21/17   [name], MD   Social History   Social History  . Marital status: Married    Spouse name: [name]  . Number of children: [name]  . Years of education: [name]   Occupational History  . professor [employer]   Social History Main Topics  . Smoking status: Never Smoker  . Smokeless tobacco: Never Used  . Alcohol use No  . Drug use: No  . Sexual activity: Yes    Partners: Female   Other Topics Concern  . Not on file   Social History Narrative  . No narrative on file   Review of Systems  Constitutional: Negative for fever.  Gastrointestinal: Negative for diarrhea and vomiting.      Objective:   Physical Exam  Constitutional: He is oriented to person, place, and time. He appears well-developed and well-nourished. No distress.  HENT:  Head: Normocephalic and atraumatic.  Eyes:  Conjunctivae and EOM are normal.  Neck: Neck supple. No tracheal deviation present.  Cardiovascular: Normal rate and regular rhythm.   Pulmonary/Chest: Effort normal and breath sounds normal. No respiratory distress.  Musculoskeletal: Normal range of motion.  Neurological: He is alert and oriented to person, place, and time.  Skin: Skin is warm and dry.  Psychiatric: He has a normal mood and affect. His behavior is normal.  Nursing note and vitals reviewed.  Vitals:   04/12/17 1003  BP: 126/76  Pulse: 85  Resp: 16  Temp:  98.6 F (37 C)  TempSrc: Oral  SpO2: 100%  Weight: 240 lb 6.4 oz (109 kg)  Height: 6' (1.829 m)      Assessment & Plan:   Jeffrey Perry is a 71 y.o. male Need for malaria prophylaxis - Plan: doxycycline (VIBRA-TABS) 100 MG tablet  - Doxycycline, start 1-2 days prior to travel, continue for 4 weeks after returning home.  Essential hypertension - Plan: alfuzosin (UROXATRAL) 10 MG 24 hr tablet  - Stable, needs refills of alfuzosin for travel, follow-up once returned as planned.   Counseling about travel  - CDC.gov for other travel information.  Need for immunization against typhoid - Plan: typhoid (VIVOTIF) DR capsule  -Vivotif every other day 4 doses, potential side effects discussed.   Meds ordered this encounter  Medications  . doxycycline (VIBRA-TABS) 100 MG tablet    Sig: Take 1 tablet (100 mg total) by mouth daily.    Dispense:  90 tablet    Refill:  0  . alfuzosin (UROXATRAL) 10 MG 24 hr tablet    Sig: Take 1 tablet (10 mg total) by mouth daily.    Dispense:  90 tablet    Refill:  0  . typhoid (VIVOTIF) DR capsule    Sig: Take 1 capsule by mouth every other day.    Dispense:  4 capsule    Refill:  0   Patient Instructions    See info on CDC.gov for travel.   [email or web address]  Malaria prophylaxis with doxycycline. Start 2 days before travel and continue 4 weeks after returning.   Vivotif for typhoid now.   Have a great trip and stay safe!   IF you received an x-ray today, you will receive an invoice from [facility]. Please contact [facility] at [phone] with questions or concerns regarding your invoice.   IF you received labwork today, you will receive an invoice from [facility]. Please contact [facility] at [phone] with questions or concerns regarding your invoice.   Our billing staff will not be able to assist you with questions regarding bills from  these companies.  You will be contacted with the lab results as soon as they are available. The fastest way to get your results is to activate your My Chart account. Instructions are located on the last page of this paperwork. If you have not heard from [place] regarding the results in 2 weeks, please contact this office.       I personally performed the services described in this documentation, which was scribed in my presence. The recorded information has been reviewed and considered for accuracy and completeness, addended by me as needed, and agree with information above.  Signed,   [name], MD Primary Care at [place].  04/12/17 6:53 PM

## 2017-04-12 NOTE — Patient Instructions (Addendum)
  See info on CDC.gov for travel.   [email or web address]  Malaria prophylaxis with doxycycline. Start 2 days before travel and continue 4 weeks after returning.   Vivotif for typhoid now.   Have a great trip and stay safe!   IF you received an x-ray today, you will receive an invoice from [facility]. Please contact [facility] at [phone] with questions or concerns regarding your invoice.   IF you received labwork today, you will receive an invoice from [place]. Please contact [facility] at [phone] with questions or concerns regarding your invoice.   Our billing staff will not be able to assist you with questions regarding bills from these companies.  You will be contacted with the lab results as soon as they are available. The fastest way to get your results is to activate your My Chart account. Instructions are located on the last page of this paperwork. If you have not heard from [place] regarding the results in 2 weeks, please contact this office.

## 2017-07-17 NOTE — Progress Notes (Signed)
Cardiology Office Note   Date:  07/18/2017   ID:  Ogle, Hoeffner [birth year], MRN [id number]  PCP:  Doreene Nest, MD  Cardiologist:   Minus Breeding, MD   Chief Complaint  Patient presents with  . Heart Murmur      History of Present Illness: Jeffrey Perry is a 71 y.o. male who presents for evaluation of HTN and a heart murmur.    He has no prior cardiac history. He has prostate cancer and has had radiation cystitis.  He eventually underwent cystourethroscopy with fulguration of the trigone, bladder neck, prostatic fossa, urethra and periurethral glands.  I saw him last for referral of a murmur which I did not think represented significant pathology. Since I last saw him he has had some bursitis problems and has not been walking as much.  The patient denies any new symptoms such as chest discomfort, neck or arm discomfort. There has been no new shortness of breath, PND or orthopnea. There have been no reported palpitations, presyncope or syncope.   Past Medical History:  Diagnosis Date  . Hypertension   . Paget disease of bone    Questionable diagnosis.   . Prostate cancer [facility])     Past Surgical History:  Procedure Laterality Date  . FINGER GANGLION CYST EXCISION  2011   3rd finger right hand  . HYDROCELE EXCISION  2012     Current Outpatient Prescriptions  Medication Sig Dispense Refill  . alfuzosin (UROXATRAL) 10 MG 24 hr tablet Take 1 tablet (10 mg total) by mouth daily. 90 tablet 0  . diltiazem (TIAZAC) 180 MG 24 hr capsule Take 1 capsule (180 mg total) by mouth at bedtime. 90 capsule 2  . doxycycline (VIBRA-TABS) 100 MG tablet Take 1 tablet (100 mg total) by mouth daily. 90 tablet 0  . losartan-hydrochlorothiazide (HYZAAR) 100-12.5 MG tablet Take 1 tablet by mouth daily. 90 tablet 2  . typhoid (VIVOTIF) DR capsule Take 1 capsule by mouth every other day. 4 capsule 0   No current facility-administered medications for this visit.     Allergies:   Latex;  Lisinopril; Neuromuscular blocking agents; Pyridium [phenazopyridine hcl]; and Sulfonamide derivatives    ROS:  Please see the history of present illness.   Otherwise, review of systems are positive for none.   All other systems are reviewed and negative.    PHYSICAL EXAM: VS:  BP (!) 146/74   Pulse 63   Ht 6\' 1"  (1.854 m)   Wt 242 lb (109.8 kg)   BMI 31.93 kg/m  , BMI Body mass index is 31.93 kg/m.  GENERAL:  Well appearing NECK:  No jugular venous distention, waveform within normal limits, carotid upstroke brisk and symmetric, no bruits, no thyromegaly LUNGS:  Clear to auscultation bilaterally BACK:  No CVA tenderness CHEST:  Unremarkable HEART:  PMI not displaced or sustained,S1 and S2 within normal limits, no S3, no S4, no clicks, no rubs, 2/6 apical systolic murmur without radiation or change with Valsalva, no diastolic murmurs ABD:  Flat, positive bowel sounds normal in frequency in pitch, no bruits, no rebound, no guarding, no midline pulsatile mass, no hepatomegaly, no splenomegaly EXT:  2 plus pulses throughout, no edema, no cyanosis no clubbing    EKG:  EKG is not  ordered today. Sinus rhythm, rate 63, left anterior fascicular block, left ventricular hypertrophy, no acute T-wave changes.   Recent Labs: 02/21/2017: ALT 20; BUN 18; Creatinine, Ser 1.51; Potassium 4.3; Sodium 141  Lipid Panel    Component Value Date/Time   CHOL 151 02/21/2017 1131   TRIG 129 02/21/2017 1131   HDL 34 (L) 02/21/2017 1131   CHOLHDL 4.4 02/21/2017 1131   CHOLHDL 3.8 07/22/2016 0842   VLDL 16 07/22/2016 0842   LDLCALC 91 02/21/2017 1131      Wt Readings from Last 3 Encounters:  07/18/17 242 lb (109.8 kg)  04/12/17 240 lb 6.4 oz (109 kg)  02/21/17 236 lb 9.6 oz (107.3 kg)      Other studies Reviewed: Additional studies/ records that were reviewed today include: None Review of the above records demonstrates:     ASSESSMENT AND PLAN:  MURMUR:  I will check an echo.  I  suspect that this is aortic sclerosis. He is not really having any symptoms related to this.   HTN:  BP is elevated today but this is unusual.  He will bring his blood pressure cuff to get correlated with ours to make sure it is okay.     Current medicines are reviewed at length with the patient today.  The patient does not have concerns regarding medicines.  The following changes have been made:   None  Labs/ tests ordered today include:   Orders Placed This Encounter  Procedures  . ECHOCARDIOGRAM COMPLETE     Disposition:   FU with one year.     Signed, Minus Breeding, MD  07/18/2017 9:50 AM    [place]

## 2017-07-18 ENCOUNTER — Ambulatory Visit (INDEPENDENT_AMBULATORY_CARE_PROVIDER_SITE_OTHER): Payer: BC Managed Care – PPO | Admitting: Cardiology

## 2017-07-18 ENCOUNTER — Encounter: Payer: Self-pay | Admitting: Cardiology

## 2017-07-18 VITALS — BP 146/74 | HR 63 | Ht 73.0 in | Wt 242.0 lb

## 2017-07-18 DIAGNOSIS — R011 Cardiac murmur, unspecified: Secondary | ICD-10-CM | POA: Diagnosis not present

## 2017-07-18 DIAGNOSIS — I10 Essential (primary) hypertension: Secondary | ICD-10-CM

## 2017-07-18 NOTE — Patient Instructions (Signed)
Medication Instructions:  Continue current medications  Labwork: None ordered  Testing/Procedures: Your physician has requested that you have an echocardiogram. Echocardiography is a painless test that uses sound waves to create images of your heart. It provides your doctor with information about the size and shape of your heart and how well your heart's chambers and valves are working. This procedure takes approximately one hour. There are no restrictions for this procedure.  Follow-Up: Your physician wants you to follow-up in: 1 Year. You will receive a reminder letter in the mail two months in advance. If you don't receive a letter, please call our office to schedule the follow-up appointment.   Any Other Special Instructions Will Be Listed Below (If Applicable).   If you need a refill on your cardiac medications before your next appointment, please call your pharmacy.

## 2017-07-21 ENCOUNTER — Other Ambulatory Visit: Payer: Self-pay

## 2017-07-21 ENCOUNTER — Ambulatory Visit (HOSPITAL_COMMUNITY): Payer: BC Managed Care – PPO | Attending: Internal Medicine

## 2017-07-21 DIAGNOSIS — R011 Cardiac murmur, unspecified: Secondary | ICD-10-CM

## 2017-07-21 DIAGNOSIS — I10 Essential (primary) hypertension: Secondary | ICD-10-CM | POA: Insufficient documentation

## 2017-07-21 DIAGNOSIS — I071 Rheumatic tricuspid insufficiency: Secondary | ICD-10-CM | POA: Diagnosis not present

## 2017-07-21 DIAGNOSIS — M889 Osteitis deformans of unspecified bone: Secondary | ICD-10-CM | POA: Diagnosis not present

## 2017-07-21 NOTE — Addendum Note (Signed)
Addended by: Alvina Filbert B on: 07/21/2017 09:35 AM   Modules accepted: Orders

## 2017-07-26 ENCOUNTER — Other Ambulatory Visit: Payer: Self-pay | Admitting: Family Medicine

## 2017-07-26 DIAGNOSIS — I10 Essential (primary) hypertension: Secondary | ICD-10-CM

## 2017-07-28 NOTE — Telephone Encounter (Signed)
Refilled rx. Pt needs an appt,for next refill.

## 2017-09-01 ENCOUNTER — Ambulatory Visit (INDEPENDENT_AMBULATORY_CARE_PROVIDER_SITE_OTHER): Payer: BC Managed Care – PPO | Admitting: Family Medicine

## 2017-09-01 ENCOUNTER — Encounter: Payer: Self-pay | Admitting: Family Medicine

## 2017-09-01 VITALS — BP 118/80 | HR 74 | Temp 98.3°F | Resp 16 | Ht 73.0 in | Wt 235.4 lb

## 2017-09-01 DIAGNOSIS — I10 Essential (primary) hypertension: Secondary | ICD-10-CM

## 2017-09-01 DIAGNOSIS — C61 Malignant neoplasm of prostate: Secondary | ICD-10-CM

## 2017-09-01 DIAGNOSIS — R7989 Other specified abnormal findings of blood chemistry: Secondary | ICD-10-CM | POA: Diagnosis not present

## 2017-09-01 MED ORDER — LOSARTAN POTASSIUM-HCTZ 100-12.5 MG PO TABS: 1.0000 | ORAL_TABLET | Freq: Every day | ORAL | 2 refills | Status: DC

## 2017-09-01 MED ORDER — DILTIAZEM HCL ER BEADS 180 MG PO CP24: 180.0000 mg | ORAL_CAPSULE | Freq: Every day | ORAL | 2 refills | Status: DC

## 2017-09-01 MED ORDER — ALFUZOSIN HCL ER 10 MG PO TB24: 10.0000 mg | ORAL_TABLET | Freq: Every day | ORAL | 3 refills | Status: DC

## 2017-09-01 NOTE — Progress Notes (Signed)
Subjective:  By signing my name below, I, Jeffrey Perry, attest that this documentation has been prepared under the direction and in the presence of Jeffrey Ray, MD. Electronically Signed: Moises Perry, [place]. 09/01/2017 , 12:25 PM .  Patient was seen in Room 11 .   Patient ID: Jeffrey Perry, male    DOB: May 25, 1946, 71 y.o.   MRN: [id number] Chief Complaint  Patient presents with  . Hypertension    recheck  . Medication Refill    uroxatral, should be ER not SR,  need 90 day supply   HPI Jeffrey Perry is a 71 y.o. male Here for follow up of HTN. He had oatmeal for breakfast this morning.   HTN Lab Results  Component Value Date   CREATININE 1.51 (H) 02/21/2017   He takes diltiazem 180mg  QD.   Elevated creatinine See above, his creatinine was 1.26 in Aug 2017, and then 1.51 in March 2018. His creatinine peaked at 1.74 in June 2017. He denies taking ibuprofen or aleve.   Lab Results  Component Value Date   CREATININE 1.51 (H) 02/21/2017   CREATININE 1.26 (H) 07/22/2016   CREATININE 1.23 05/03/2016   CREATININE 1.36 (H) 05/02/2016   CREATININE 1.51 (H) 05/01/2016   CREATININE 1.74 (H) 04/30/2016    Prostate cancer He is followed by Dr. Janice Norrie at [facility]. He had radiation cystitis with subsequent fulguration at [facility] summer 2017. He requests refill of uroxatral ER instead of SR. He's been on this medication for a long time. He last saw Dr. Janice Norrie in March. He denies any hematuria recently.   Recent Travel He recently traveled to [place] from June 4th - July 24th. He states he almost retired when he was there visiting family as he was able to relax and unplug. He denies malaria or troubles with mosquitoes. He denies side effects with doxycycline.   Patient Active Problem List   Diagnosis Date Noted  . Murmur 07/18/2017  . Jaundice 05/03/2016  . Elevated LFTs   . Abdominal pain 04/30/2016  . Renal insufficiency 03/05/2013  . HTN (hypertension) 03/06/2012  . Prostate  cancer ([place]) 10/04/2011   Past Medical History:  Diagnosis Date  . Hypertension   . Paget disease of bone    Questionable diagnosis.   . Prostate cancer [facility])    Past Surgical History:  Procedure Laterality Date  . FINGER GANGLION CYST EXCISION  2011   3rd finger right hand  . HYDROCELE EXCISION  2012   Allergies  Allergen Reactions  . Latex Itching  . Lisinopril Cough  . Neuromuscular Blocking Agents Swelling    Ankle swelling  . Pyridium [Phenazopyridine Hcl] Swelling    Ankle swelling  . Sulfonamide Derivatives Itching   Prior to Admission medications   Medication Sig Start Date End Date Taking? Authorizing Provider  alfuzosin (UROXATRAL) 10 MG 24 hr tablet TAKE ONE TABLET BY MOUTH DAILY 07/28/17  Yes [name], MD  diltiazem [facility]) 180 MG 24 hr capsule Take 1 capsule (180 mg total) by mouth at bedtime. 02/21/17  Yes [name], MD  doxycycline (VIBRA-TABS) 100 MG tablet Take 1 tablet (100 mg total) by mouth daily. 04/12/17  Yes [name], MD  losartan-hydrochlorothiazide (HYZAAR) 100-12.5 MG tablet Take 1 tablet by mouth daily. 02/21/17  Yes [name], MD  typhoid (VIVOTIF) DR capsule Take 1 capsule by mouth every other day. 04/12/17  Yes [name], MD   Social History   Social History  . Marital  status: Married    Spouse name: [name]  . Number of children: [name]  . Years of education: [name]   Occupational History  . professor [employer]   Social History Main Topics  . Smoking status: Never Smoker  . Smokeless tobacco: Never Used  . Alcohol use No  . Drug use: No  . Sexual activity: Yes    Partners: Female   Other Topics Concern  . Not on file   Social History Narrative  . No narrative on file   Review of Systems  Constitutional: Negative for fatigue and unexpected weight change.  Eyes: Negative for visual disturbance.  Respiratory: Negative for cough, chest tightness and shortness of breath.   Cardiovascular: Negative for chest  pain, palpitations and leg swelling.  Gastrointestinal: Negative for abdominal pain and Perry in stool.  Genitourinary: Negative for difficulty urinating and hematuria.  Neurological: Negative for dizziness, light-headedness and headaches.       Objective:   Physical Exam  Constitutional: He is oriented to person, place, and time. He appears well-developed and well-nourished.  HENT:  Head: Normocephalic and atraumatic.  Eyes: Pupils are equal, round, and reactive to light. EOM are normal.  Neck: No JVD present. Carotid bruit is not present.  Cardiovascular: Normal rate, regular rhythm and normal heart sounds.   No murmur heard. Pulmonary/Chest: Effort normal and breath sounds normal. He has no rales.  Abdominal: Soft. Bowel sounds are normal. He exhibits no distension. There is no tenderness.  Musculoskeletal: He exhibits no edema.  Neurological: He is alert and oriented to person, place, and time.  Skin: Skin is warm and dry.  Psychiatric: He has a normal mood and affect.  Vitals reviewed.   Vitals:   09/01/17 1129  BP: 118/80  Pulse: 74  Resp: 16  Temp: 98.3 F (36.8 C)  SpO2: 98%  Weight: 235 lb 6.4 oz (106.8 kg)  Height: 6\' 1"  (1.854 m)      Assessment & Plan:   Jeffrey Perry is a 71 y.o. male Elevated serum creatinine - Plan: Basic metabolic panel  - Slight elevation from prior level when tested in March. Did have a bump in his creatinine last year with bladder issues then went down to normal. Denies any recent difficulty with urination.  - Repeat testing, then consider nephrology eval if persistently elevated.   - maintain hydration, avoid NSAIDS.    Essential hypertension - Plan: diltiazem (TIAZAC) 180 MG 24 hr capsule, losartan-hydrochlorothiazide (HYZAAR) 100-12.5 MG tablet, Basic metabolic panel  - Controlled, check BMP. No change in medications. Follow-up in 6 months  Prostate cancer [facility])  - Status post radiation treatment October 2012. Subsequently with  radiation cystitis summer 2017 treated with fulguration at [facility]. Has been doing well recently. Urinating well with use of Uroxatral without known side effects - refilled. Continue follow up with urology.   Meds ordered this encounter  Medications  . alfuzosin (UROXATRAL) 10 MG 24 hr tablet    Sig: Take 1 tablet (10 mg total) by mouth daily with breakfast.    Dispense:  90 tablet    Refill:  3  . diltiazem (TIAZAC) 180 MG 24 hr capsule    Sig: Take 1 capsule (180 mg total) by mouth at bedtime.    Dispense:  90 capsule    Refill:  2  . losartan-hydrochlorothiazide (HYZAAR) 100-12.5 MG tablet    Sig: Take 1 tablet by mouth daily.    Dispense:  90 tablet    Refill:  2  Patient Instructions    Thanks for coming in today. No change in Perry pressure medications for now. I reordered the Uroxatral as  extended release. Have your pharmacy call me if that needs to be order differently. I will also recheck your creatinine to see if that has improved. If the creatinine persistently remains elevated, I can refer you to nephrology to look into other causes or possible testing. For now, make sure you avoid NSAIDs such as ibuprofen or Aleve, and make sure you stay well hydrated throughout the day.  Follow-up with me in 6 months, let me know if there are any questions sooner.    IF you received an x-Perry today, you will receive an invoice from [facility]. Please contact [facility] at [phone] with questions or concerns regarding your invoice.   IF you received labwork today, you will receive an invoice from [place]. Please contact [facility] at [phone] with questions or concerns regarding your invoice.   Our billing staff will not be able to assist you with questions regarding bills from these companies.  You will be contacted with the lab results as soon as they are available. The fastest way to get your results is to activate your My Chart account. Instructions are  located on the last page of this paperwork. If you have not heard from [place] regarding the results in 2 weeks, please contact this office.       I personally performed the services described in this documentation, which was scribed in my presence. The recorded information has been reviewed and considered for accuracy and completeness, addended by me as needed, and agree with information above.  Signed,   Jeffrey Ray, MD [facility] [place].  09/01/17 12:35 PM

## 2017-09-01 NOTE — Patient Instructions (Addendum)
  Thanks for coming in today. No change in blood pressure medications for now. I reordered the Uroxatral as  extended release. Have your pharmacy call me if that needs to be order differently. I will also recheck your creatinine to see if that has improved. If the creatinine persistently remains elevated, I can refer you to nephrology to look into other causes or possible testing. For now, make sure you avoid NSAIDs such as ibuprofen or Aleve, and make sure you stay well hydrated throughout the day.  Follow-up with me in 6 months, let me know if there are any questions sooner.    IF you received an x-ray today, you will receive an invoice from [facility]. Please contact [facility] at [phone] with questions or concerns regarding your invoice.   IF you received labwork today, you will receive an invoice from [place]. Please contact [facility] at [phone] with questions or concerns regarding your invoice.   Our billing staff will not be able to assist you with questions regarding bills from these companies.  You will be contacted with the lab results as soon as they are available. The fastest way to get your results is to activate your My Chart account. Instructions are located on the last page of this paperwork. If you have not heard from [place] regarding the results in 2 weeks, please contact this office.

## 2017-09-02 LAB — BASIC METABOLIC PANEL
BUN / CREAT RATIO: 12 (ref 10–24)
BUN: 18 mg/dL (ref 8–27)
CO2: 21 mmol/L (ref 20–29)
Calcium: 9.3 mg/dL (ref 8.6–10.2)
Chloride: 103 mmol/L (ref 96–106)
Creatinine, Ser: 1.49 mg/dL — ABNORMAL HIGH (ref 0.76–1.27)
GFR, EST AFRICAN AMERICAN: 55 mL/min/{1.73_m2} — AB (ref >59)
GFR, EST NON AFRICAN AMERICAN: 47 mL/min/{1.73_m2} — AB (ref >59)
Glucose: 58 mg/dL — ABNORMAL LOW (ref 65–99)
POTASSIUM: 4.5 mmol/L (ref 3.5–5.2)
SODIUM: 143 mmol/L (ref 134–144)

## 2017-09-14 ENCOUNTER — Other Ambulatory Visit: Payer: Self-pay | Admitting: Family Medicine

## 2017-09-14 DIAGNOSIS — R7989 Other specified abnormal findings of blood chemistry: Secondary | ICD-10-CM

## 2017-12-26 ENCOUNTER — Other Ambulatory Visit: Payer: Self-pay | Admitting: Nephrology

## 2017-12-26 DIAGNOSIS — N320 Bladder-neck obstruction: Secondary | ICD-10-CM

## 2018-01-02 ENCOUNTER — Other Ambulatory Visit: Payer: BC Managed Care – PPO

## 2018-01-11 ENCOUNTER — Ambulatory Visit
Admission: RE | Admit: 2018-01-11 | Discharge: 2018-01-11 | Disposition: A | Discharge status: Home / Self Care | Payer: BC Managed Care – PPO | Source: Ambulatory Visit | Attending: Nephrology | Admitting: Nephrology

## 2018-01-11 DIAGNOSIS — N320 Bladder-neck obstruction: Secondary | ICD-10-CM

## 2018-01-28 ENCOUNTER — Encounter: Payer: Self-pay | Admitting: Family Medicine

## 2018-03-24 ENCOUNTER — Encounter: Payer: Self-pay | Admitting: Family Medicine

## 2018-03-24 ENCOUNTER — Ambulatory Visit: Payer: BC Managed Care – PPO | Admitting: Family Medicine

## 2018-03-24 ENCOUNTER — Other Ambulatory Visit: Payer: Self-pay

## 2018-03-24 VITALS — BP 150/82 | HR 56 | Temp 97.6°F | Ht 73.0 in | Wt 233.0 lb

## 2018-03-24 DIAGNOSIS — I10 Essential (primary) hypertension: Secondary | ICD-10-CM | POA: Diagnosis not present

## 2018-03-24 DIAGNOSIS — Z23 Encounter for immunization: Secondary | ICD-10-CM | POA: Diagnosis not present

## 2018-03-24 MED ORDER — LOSARTAN POTASSIUM-HCTZ 100-25 MG PO TABS: 1.0000 | ORAL_TABLET | Freq: Every day | ORAL | 1 refills | Status: DC

## 2018-03-24 NOTE — Patient Instructions (Addendum)
Morning blood pressures look ok, but evening readings and level here is too high. I will increase the HCT part of your losartan/HCT combo.   Follow up one more time before traveling so we can check the pressure and electrolytes.    Thank you for coming in today.     How to Take Your Blood Pressure You can take your blood pressure at home with a machine. You may need to check your blood pressure at home:  To check if you have high blood pressure (hypertension).  To check your blood pressure over time.  To make sure your blood pressure medicine is working.  Supplies needed: You will need a blood pressure machine, or monitor. You can buy one at a drugstore or online. When choosing one:  Choose one with an arm cuff.  Choose one that wraps around your upper arm. Only one finger should fit between your arm and the cuff.  Do not choose one that measures your blood pressure from your wrist or finger.  Your doctor can suggest a monitor. How to prepare Avoid these things for 30 minutes before checking your blood pressure:  Drinking caffeine.  Drinking alcohol.  Eating.  Smoking.  Exercising.  Five minutes before checking your blood pressure:  Pee.  Sit in a dining chair. Avoid sitting in a soft couch or armchair.  Be quiet. Do not talk.  How to take your blood pressure Follow the instructions that came with your machine. If you have a digital blood pressure monitor, these may be the instructions: 1. Sit up straight. 2. Place your feet on the floor. Do not cross your ankles or legs. 3. Rest your left arm at the level of your heart. You may rest it on a table, desk, or chair. 4. Pull up your shirt sleeve. 5. Wrap the blood pressure cuff around the upper part of your left arm. The cuff should be 1 inch (2.5 cm) above your elbow. It is best to wrap the cuff around bare skin. 6. Fit the cuff snugly around your arm. You should be able to place only one finger between the cuff  and your arm. 7. Put the cord inside the groove of your elbow. 8. Press the power button. 9. Sit quietly while the cuff fills with air and loses air. 10. Write down the numbers on the screen. 11. Wait 2-3 minutes and then repeat steps 1-10.  What do the numbers mean? Two numbers make up your blood pressure. The first number is called systolic pressure. The second is called diastolic pressure. An example of a blood pressure reading is "120 over 80" (or 120/80). If you are an adult and do not have a medical condition, use this guide to find out if your blood pressure is normal: Normal  First number: below 120.  Second number: below 80. Elevated  First number: 120-129.  Second number: below 80. Hypertension stage 1  First number: 130-139.  Second number: 80-89. Hypertension stage 2  First number: 140 or above.  Second number: 63 or above. Your blood pressure is above normal even if only the top or bottom number is above normal. Follow these instructions at home:  Check your blood pressure as often as your doctor tells you to.  Take your monitor to your next doctor's appointment. Your doctor will: ? Make sure you are using it correctly. ? Make sure it is working right.  Make sure you understand what your blood pressure numbers should be.  Tell  your doctor if your medicines are causing side effects. Contact a doctor if:  Your blood pressure keeps being high. Get help right away if:  Your first blood pressure number is higher than 180.  Your second blood pressure number is higher than 120. This information is not intended to replace advice given to you by your health care provider. Make sure you discuss any questions you have with your health care provider. Document Released: 10/28/2008 Document Revised: 10/13/2016 Document Reviewed: 04/23/2016 Elsevier Interactive Patient Education  2018 [employer].   IF you received an x-ray today, you will receive an invoice  from [facility]. Please contact [facility] at [phone] with questions or concerns regarding your invoice.   IF you received labwork today, you will receive an invoice from [place]. Please contact [facility] at [phone] with questions or concerns regarding your invoice.   Our billing staff will not be able to assist you with questions regarding bills from these companies.  You will be contacted with the lab results as soon as they are available. The fastest way to get your results is to activate your My Chart account. Instructions are located on the last page of this paperwork. If you have not heard from [place] regarding the results in 2 weeks, please contact this office.

## 2018-03-24 NOTE — Progress Notes (Signed)
Subjective:  By signing my name below, I, Moises Blood, attest that this documentation has been prepared under the direction and in the presence of [name], MD. Electronically Signed: Moises Blood, [place]. 03/24/2018 , 3:34 PM .  Patient was seen in Room 3 .   Patient ID: Jeffrey Perry, male    DOB: January 11, 1946, 72 y.o.   MRN: [id number] Chief Complaint  Patient presents with  . Chronic Condition    dicuss med change for Blood Pressure.    HPI Jeffrey Perry is a 72 y.o. male Here for follow up.   HTN He has a history of HTN with elevated creatinine/chronic kidney disease. eGFR 55. He currently takes diltiazem 150m QD and Hyzaar 100-12.561mQD.   BP Readings from Last 3 Encounters:  03/24/18 (!) 150/82  09/01/17 118/80  07/18/17 (!) 146/74   Patient states he takes his diltiazem at night, and his Hyzaar at 8:00-9:00AM. He brought in home BP readings: mornings, low as 98/62 and high of 136/78, and afternoon readings (9:30-10:00PM), 119/75 low to 152/73 high, but primarily in 130s-140s. He denies nocturia. He denies chest pain, lightheadedness or dizziness. He mentions occasional ankle swelling.   [facility]. He has a visit scheduled in June, but he is out of the country.   Foreign Travel Patient is planning to travel to [place] May 30th, and return around July.   Prostate cancer He has a history of prostate cancer, treated by urology. He has used Uroxatral.   Patient Active Problem List   Diagnosis Date Noted  . Murmur 07/18/2017  . Jaundice 05/03/2016  . Elevated LFTs   . Abdominal pain 04/30/2016  . Renal insufficiency 03/05/2013  . HTN (hypertension) 03/06/2012  . [place]   Past Medical History:  Diagnosis Date  . Hypertension   . Paget disease of bone    Questionable diagnosis.   . [facility]   Past Surgical History:  Procedure  Laterality Date  . FINGER GANGLION CYST EXCISION  2011   3rd finger right hand  . HYDROCELE EXCISION  2012   Allergies  Allergen Reactions  . Latex Itching  . Lisinopril Cough  . Neuromuscular Blocking Agents Swelling    Ankle swelling  . Pyridium [Phenazopyridine Hcl] Swelling    Ankle swelling  . Sulfonamide Derivatives Itching   Prior to Admission medications   Medication Sig Start Date End Date Taking? Authorizing Provider  alfuzosin (UROXATRAL) 10 MG 24 hr tablet Take 1 tablet (10 mg total) by mouth daily with breakfast. 09/01/17  Yes GrWendie AgresteMD  diltiazem ([facility] hr capsule Take 1 capsule (180 mg total) by mouth at bedtime. 09/01/17  Yes GrWendie AgresteMD  losartan-hydrochlorothiazide (HYZAAR) 100-12.5 MG tablet Take 1 tablet by mouth daily. 09/01/17  Yes GrWendie AgresteMD   Social History   Socioeconomic History  . Marital status: Married    Spouse name: Not on file  . Number of children: Not on file  . Years of education: Not on file  . Highest education level: Not on file  Occupational History  . 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 Financial resource strain: Not on file  . Food insecurity:    Worry: Not on file    Inability: Not on file  . Transportation needs:    Medical: Not on file  Non-medical: Not on file  Tobacco Use  . Smoking status: Never Smoker  . Smokeless tobacco: Never Used  Substance and Sexual Activity  . Alcohol use: No  . Drug use: No  . Sexual activity: Yes    Partners: Female  Lifestyle  . Physical activity:    Days per week: Not on file    Minutes per session: Not on file  . Stress: Not on file  Relationships  . Social connections:    Talks on phone: Not on file    Gets together: Not on file    Attends religious service: Not on file    Active member of club or organization: Not on file    Attends meetings of clubs or organizations: Not on file    Relationship status: Not on file    . Intimate partner violence:    Fear of current or ex partner: Not on file    Emotionally abused: Not on file    Physically abused: Not on file    Forced sexual activity: Not on file  Other Topics Concern  . Not on file  Social History Narrative  . Not on file   Review of Systems  Constitutional: Negative for fatigue and unexpected weight change.  Eyes: Negative for visual disturbance.  Respiratory: Negative for cough, chest tightness and shortness of breath.   Cardiovascular: Negative for chest pain, palpitations and leg swelling.  Gastrointestinal: Negative for abdominal pain and blood in stool.  Neurological: Negative for dizziness, light-headedness and headaches.       Objective:   Physical Exam  Constitutional: He is oriented to person, place, and time. He appears well-developed and well-nourished.  HENT:  Head: Normocephalic and atraumatic.  Eyes: Pupils are equal, round, and reactive to light. EOM are normal.  Neck: No JVD present. Carotid bruit is not present.  Cardiovascular: Normal rate, regular rhythm and normal heart sounds.  No murmur heard. Pulmonary/Chest: Effort normal and breath sounds normal. He has no rales.  Musculoskeletal: He exhibits no edema.  Neurological: He is alert and oriented to person, place, and time.  Skin: Skin is warm and dry.  Psychiatric: He has a normal mood and affect.  Vitals reviewed.   Vitals:   03/24/18 1402 03/24/18 1408  BP: (!) 158/82 (!) 150/82  Pulse: (!) 56   Temp: 97.6 F (36.4 C)   TempSrc: Oral   SpO2: 100%   Weight: 233 lb (105.7 kg)   Height: 6' 1"  (1.854 m)        Assessment & Plan:   Jeffrey Perry is a 72 y.o. male Essential hypertension  -Based on home readings losartan HCT dosing to 25 mg of hydrochlorothiazide.  Continue diltiazem same dose.  Monitor for orthostatic symptoms.  Need for prophylactic vaccination with combined diphtheria-tetanus-pertussis (DTP) vaccine - Plan: Tdap vaccine greater than or  equal to 7yo IM.   Meds ordered this encounter  Medications  . losartan-hydrochlorothiazide (HYZAAR) 100-25 MG tablet    Sig: Take 1 tablet by mouth daily.    Dispense:  90 tablet    Refill:  1   Patient Instructions    Morning blood pressures look ok, but evening readings and level here is too high. I will increase the HCT part of your losartan/HCT combo.   Follow up one more time before traveling so we can check the pressure and electrolytes.    Thank you for coming in today.     How to Take Your Blood Pressure You can take  your blood pressure at home with a machine. You may need to check your blood pressure at home:  To check if you have high blood pressure (hypertension).  To check your blood pressure over time.  To make sure your blood pressure medicine is working.  Supplies needed: You will need a blood pressure machine, or monitor. You can buy one at a drugstore or online. When choosing one:  Choose one with an arm cuff.  Choose one that wraps around your upper arm. Only one finger should fit between your arm and the cuff.  Do not choose one that measures your blood pressure from your wrist or finger.  Your doctor can suggest a monitor. How to prepare Avoid these things for 30 minutes before checking your blood pressure:  Drinking caffeine.  Drinking alcohol.  Eating.  Smoking.  Exercising.  Five minutes before checking your blood pressure:  Pee.  Sit in a dining chair. Avoid sitting in a soft couch or armchair.  Be quiet. Do not talk.  How to take your blood pressure Follow the instructions that came with your machine. If you have a digital blood pressure monitor, these may be the instructions: 1. Sit up straight. 2. Place your feet on the floor. Do not cross your ankles or legs. 3. Rest your left arm at the level of your heart. You may rest it on a table, desk, or chair. 4. Pull up your shirt sleeve. 5. Wrap the blood pressure cuff around  the upper part of your left arm. The cuff should be 1 inch (2.5 cm) above your elbow. It is best to wrap the cuff around bare skin. 6. Fit the cuff snugly around your arm. You should be able to place only one finger between the cuff and your arm. 7. Put the cord inside the groove of your elbow. 8. Press the power button. 9. Sit quietly while the cuff fills with air and loses air. 10. Write down the numbers on the screen. 11. Wait 2-3 minutes and then repeat steps 1-10.  What do the numbers mean? Two numbers make up your blood pressure. The first number is called systolic pressure. The second is called diastolic pressure. An example of a blood pressure reading is "120 over 80" (or 120/80). If you are an adult and do not have a medical condition, use this guide to find out if your blood pressure is normal: Normal  First number: below 120.  Second number: below 80. Elevated  First number: 120-129.  Second number: below 80. Hypertension stage 1  First number: 130-139.  Second number: 80-89. Hypertension stage 2  First number: 140 or above.  Second number: 15 or above. Your blood pressure is above normal even if only the top or bottom number is above normal. Follow these instructions at home:  Check your blood pressure as often as your doctor tells you to.  Take your monitor to your next doctor's appointment. Your doctor will: ? Make sure you are using it correctly. ? Make sure it is working right.  Make sure you understand what your blood pressure numbers should be.  Tell your doctor if your medicines are causing side effects. Contact a doctor if:  Your blood pressure keeps being high. Get help right away if:  Your first blood pressure number is higher than 180.  Your second blood pressure number is higher than 120. This information is not intended to replace advice given to you by your health care provider. Make sure  you discuss any questions you have with your health  care provider. Document Released: 10/28/2008 Document Revised: 10/13/2016 Document Reviewed: 04/23/2016 Elsevier Interactive Patient Education  2018 [employer].   IF you received an x-ray today, you will receive an invoice from [facility]. Please contact [facility] at [phone] with questions or concerns regarding your invoice.   IF you received labwork today, you will receive an invoice from [place]. Please contact [facility] at [phone] with questions or concerns regarding your invoice.   Our billing staff will not be able to assist you with questions regarding bills from these companies.  You will be contacted with the lab results as soon as they are available. The fastest way to get your results is to activate your My Chart account. Instructions are located on the last page of this paperwork. If you have not heard from [place] regarding the results in 2 weeks, please contact this office.       I personally performed the services described in this documentation, which was scribed in my presence. The recorded information has been reviewed and considered for accuracy and completeness, addended by me as needed, and agree with information above.  Signed,   [name], MD Primary [facility] at [place].  03/26/18 10:44 AM

## 2018-03-26 ENCOUNTER — Encounter: Payer: Self-pay | Admitting: Family Medicine

## 2018-04-19 ENCOUNTER — Encounter: Payer: Self-pay | Admitting: Family Medicine

## 2018-04-19 ENCOUNTER — Ambulatory Visit: Payer: BC Managed Care – PPO | Admitting: Family Medicine

## 2018-04-19 VITALS — BP 120/58 | HR 70 | Temp 98.2°F | Ht 73.0 in | Wt 232.4 lb

## 2018-04-19 DIAGNOSIS — Z298 Encounter for other specified prophylactic measures: Secondary | ICD-10-CM

## 2018-04-19 DIAGNOSIS — I10 Essential (primary) hypertension: Secondary | ICD-10-CM | POA: Diagnosis not present

## 2018-04-19 MED ORDER — DILTIAZEM HCL ER BEADS 180 MG PO CP24: 180.0000 mg | ORAL_CAPSULE | Freq: Every day | ORAL | 2 refills | Status: DC

## 2018-04-19 MED ORDER — DOXYCYCLINE HYCLATE 100 MG PO TABS: 100.0000 mg | ORAL_TABLET | Freq: Every day | ORAL | 0 refills | Status: DC

## 2018-04-19 NOTE — Patient Instructions (Addendum)
Ok to continue same meds for now. Watch for lightheadedness or dizziness on th new regimen.  We can decrease dose if needed.   Doxycycline once per day for malaria prophylaxis. Continue for 30 days after returning.   Thanks for coming in today.    IF you received an x-ray today, you will receive an invoice from [facility]. Please contact [facility] at [phone] with questions or concerns regarding your invoice.   IF you received labwork today, you will receive an invoice from [place]. Please contact [facility] at [phone] with questions or concerns regarding your invoice.   Our billing staff will not be able to assist you with questions regarding bills from these companies.  You will be contacted with the lab results as soon as they are available. The fastest way to get your results is to activate your My Chart account. Instructions are located on the last page of this paperwork. If you have not heard from [place] regarding the results in 2 weeks, please contact this office.

## 2018-04-19 NOTE — Progress Notes (Signed)
Subjective:  By signing my name below, I, Essence Howell, attest that this documentation has been prepared under the direction and in the presence of [name], MD Electronically Signed: Ladene Artist, ED Scribe 04/19/2018 at 10:35 AM.   Patient ID: Jeffrey Perry, male    DOB: 1946/10/28, 72 y.o.   MRN: [id number]  Chief Complaint  Patient presents with  . Hypertension    and labs follow up. Melaria medication due to traveling soon to [place]    HPI Jeffrey Perry is a 72 y.o. male who presents to Primary Care at [facility] for f/u on HTN. Pt is not fasting at this visit.  HTN Last seen 4/26. Elevated BP at that time. H/o CKD. HCTZ was increased to 25 mg. - Pt report evening readings of 130s/60s-70s. States he now takes HCTZ in the morning and diltiazem around noon due to side-effects of lightheadedness. Reports that he is experiencing some mild lightheadedness at this time which he attributes to taking the meds too soon; states he took BP meds this morning. Denies any other symptoms. Lab Results  Component Value Date   CREATININE 1.49 (H) 09/01/2017   BP Readings from Last 3 Encounters:  04/19/18 (!) 120/58  03/24/18 (!) 150/82  09/01/17 118/80   Malaria Prophylaxis He will be traveling to [place]. - Pt will be going for 6 wks, returns July 18. He has taken doxycyline in the past without complications.  Patient Active Problem List   Diagnosis Date Noted  . Murmur 07/18/2017  . Jaundice 05/03/2016  . Elevated LFTs   . Abdominal pain 04/30/2016  . Renal insufficiency 03/05/2013  . HTN (hypertension) 03/06/2012  . Prostate cancer ([place]) 10/04/2011   Past Medical History:  Diagnosis Date  . Hypertension   . Paget disease of bone    Questionable diagnosis.   . Prostate cancer [facility])    Past Surgical History:  Procedure Laterality Date  . FINGER GANGLION CYST EXCISION  2011   3rd finger right hand  . HYDROCELE EXCISION  2012   Allergies  Allergen Reactions  . Latex Itching  .  Lisinopril Cough  . Neuromuscular Blocking Agents Swelling    Ankle swelling  . Pyridium [Phenazopyridine Hcl] Swelling    Ankle swelling  . Sulfonamide Derivatives Itching   Prior to Admission medications   Medication Sig Start Date End Date Taking? Authorizing Provider  alfuzosin (UROXATRAL) 10 MG 24 hr tablet Take 1 tablet (10 mg total) by mouth daily with breakfast. 09/01/17   [name], MD  diltiazem [facility]) 180 MG 24 hr capsule Take 1 capsule (180 mg total) by mouth at bedtime. 09/01/17   [name], MD  losartan-hydrochlorothiazide (HYZAAR) 100-25 MG tablet Take 1 tablet by mouth daily. 03/24/18   [name], MD   Social History   Socioeconomic History  . Marital status: Married    Spouse name: Not on file  . Number of children: Not on file  . Years of education: Not on file  . Highest education level: Not on file  Occupational History  . 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 Financial resource strain: Not on file  . Food insecurity:    Worry: Not on file    Inability: Not on file  . Transportation needs:    Medical: Not on file    Non-medical: Not on file  Tobacco Use  . Smoking status: Never Smoker  . Smokeless tobacco: Never Used  Substance and  Sexual Activity  . Alcohol use: No  . Drug use: No  . Sexual activity: Yes    Partners: Female  Lifestyle  . Physical activity:    Days per week: Not on file    Minutes per session: Not on file  . Stress: Not on file  Relationships  . Social connections:    Talks on phone: Not on file    Gets together: Not on file    Attends religious service: Not on file    Active member of club or organization: Not on file    Attends meetings of clubs or organizations: Not on file    Relationship status: Not on file  . Intimate partner violence:    Fear of current or ex partner: Not on file    Emotionally abused: Not on file    Physically abused: Not on file    Forced sexual activity:  Not on file  Other Topics Concern  . Not on file  Social History Narrative  . Not on file   Review of Systems  Constitutional: Negative for fatigue and unexpected weight change.  Eyes: Negative for visual disturbance.  Respiratory: Negative for cough, chest tightness and shortness of breath.   Cardiovascular: Negative for chest pain, palpitations and leg swelling.  Gastrointestinal: Negative for abdominal pain and blood in stool.  Neurological: Positive for light-headedness (mild). Negative for dizziness and headaches.      Objective:   Physical Exam  Constitutional: He is oriented to person, place, and time. He appears well-developed and well-nourished.  HENT:  Head: Normocephalic and atraumatic.  Eyes: Pupils are equal, round, and reactive to light. EOM are normal.  Neck: No JVD present. Carotid bruit is not present.  Cardiovascular: Normal rate, regular rhythm and normal heart sounds.  No murmur heard. Pulmonary/Chest: Effort normal and breath sounds normal. He has no rales.  Musculoskeletal: He exhibits no edema.  Neurological: He is alert and oriented to person, place, and time.  Skin: Skin is warm and dry.  Psychiatric: He has a normal mood and affect.  Vitals reviewed.  Vitals:   04/19/18 1019  BP: (!) 120/58  Pulse: 70  Temp: 98.2 F (36.8 C)  TempSrc: Oral  SpO2: 100%  Weight: 232 lb 6.4 oz (105.4 kg)  Height: 6\' 1"  (1.854 m)      Assessment & Plan:   Jeffrey Perry is a 72 y.o. male Essential hypertension - Plan: Comprehensive metabolic panel, diltiazem (TIAZAC) 180 MG 24 hr capsule  -Okay to continue same dose of diltiazem and HCTZ for now including dosing intervals.  If orthostatic symptoms, or borderline low blood pressure at home could consider decreasing dose of 1 of the other.  Advised to let me know if this is a case.  Need for malaria prophylaxis - Plan: doxycycline (VIBRA-TABS) 100 MG tablet  - Options discussed, will treat with doxycycline, start  before trip and continue 30 days afterwards.  Potential side effects/risks discussed.  Meds ordered this encounter  Medications  . doxycycline (VIBRA-TABS) 100 MG tablet    Sig: Take 1 tablet (100 mg total) by mouth daily.    Dispense:  70 tablet    Refill:  0  . diltiazem (TIAZAC) 180 MG 24 hr capsule    Sig: Take 1 capsule (180 mg total) by mouth at bedtime.    Dispense:  90 capsule    Refill:  2   Patient Instructions   Ok to continue same meds for now. Watch for lightheadedness or  dizziness on th new regimen.  We can decrease dose if needed.   Doxycycline once per day for malaria prophylaxis. Continue for 30 days after returning.   Thanks for coming in today.    IF you received an x-ray today, you will receive an invoice from [facility]. Please contact [facility] Radiology at [phone] with questions or concerns regarding your invoice.   IF you received labwork today, you will receive an invoice from [place]. Please contact [facility] at [phone] with questions or concerns regarding your invoice.   Our billing staff will not be able to assist you with questions regarding bills from these companies.  You will be contacted with the lab results as soon as they are available. The fastest way to get your results is to activate your My Chart account. Instructions are located on the last page of this paperwork. If you have not heard from [place] regarding the results in 2 weeks, please contact this office.       I personally performed the services described in this documentation, which was scribed in my presence. The recorded information has been reviewed and considered for accuracy and completeness, addended by me as needed, and agree with information above.  Signed,   [name], MD Primary Care at [place].  04/21/18 12:47 PM

## 2018-04-20 LAB — COMPREHENSIVE METABOLIC PANEL
ALT: 14 IU/L (ref 0–44)
AST: 23 IU/L (ref 0–40)
Albumin/Globulin Ratio: 1.5 (ref 1.2–2.2)
Albumin: 4.2 g/dL (ref 3.5–4.8)
Alkaline Phosphatase: 280 IU/L — ABNORMAL HIGH (ref 39–117)
BILIRUBIN TOTAL: 0.3 mg/dL (ref 0.0–1.2)
BUN/Creatinine Ratio: 13 (ref 10–24)
BUN: 16 mg/dL (ref 8–27)
CHLORIDE: 103 mmol/L (ref 96–106)
CO2: 22 mmol/L (ref 20–29)
Calcium: 9.1 mg/dL (ref 8.6–10.2)
Creatinine, Ser: 1.26 mg/dL (ref 0.76–1.27)
GFR calc non Af Amer: 57 mL/min/{1.73_m2} — ABNORMAL LOW (ref >59)
GFR, EST AFRICAN AMERICAN: 66 mL/min/{1.73_m2} (ref >59)
GLUCOSE: 98 mg/dL (ref 65–99)
Globulin, Total: 2.8 g/dL (ref 1.5–4.5)
Potassium: 3.7 mmol/L (ref 3.5–5.2)
Sodium: 139 mmol/L (ref 134–144)
TOTAL PROTEIN: 7 g/dL (ref 6.0–8.5)

## 2018-07-14 ENCOUNTER — Other Ambulatory Visit: Payer: Self-pay | Admitting: Family Medicine

## 2018-07-14 NOTE — Telephone Encounter (Signed)
alfuzosin 10 mg 24 hr  refill Last Refill:09/01/17 # 90  And 3 refills Last OV: 04/19/18 PCP: [place]

## 2018-07-16 NOTE — Progress Notes (Signed)
Cardiology Office Note   Date:  07/18/2018   ID:  Jeffrey Perry October 08, 1946, MRN [id number]  PCP:  Doreene Nest, MD  Cardiologist:   Jeffrey Breeding, MD   Chief Complaint  Patient presents with  . Triscuspid regurgitation      History of Present Illness: Jeffrey Perry is a 72 y.o. male who presents for evaluation of HTN and a heart murmur.   He was found to have TR.   Since I last saw him he has done well.  The patient denies any new symptoms such as chest discomfort, neck or arm discomfort. There has been no new shortness of breath, PND or orthopnea. There have been no reported palpitations, presyncope or syncope.   He walks 4 miles everyday.  Of note he had malaria recently.    Past Medical History:  Diagnosis Date  . Hypertension   . Paget disease of bone    Questionable diagnosis.   . Prostate cancer [facility])     Past Surgical History:  Procedure Laterality Date  . FINGER GANGLION CYST EXCISION  2011   3rd finger right hand  . HYDROCELE EXCISION  2012     Current Outpatient Medications  Medication Sig Dispense Refill  . alfuzosin (UROXATRAL) 10 MG 24 hr tablet TAKE ONE TABLET BY MOUTH EVERY MORNING WITH BREAKFAST 90 tablet 2  . diltiazem (TIAZAC) 180 MG 24 hr capsule Take 1 capsule (180 mg total) by mouth at bedtime. 90 capsule 2  . losartan-hydrochlorothiazide (HYZAAR) 100-25 MG tablet Take 1 tablet by mouth daily. 90 tablet 1   No current facility-administered medications for this visit.     Allergies:   Latex; Lisinopril; Neuromuscular blocking agents; Phenazopyridine hcl; Pyridium [phenazopyridine hcl]; and Sulfonamide derivatives    ROS:  Please see the history of present illness.   Otherwise, review of systems are positive for no.   All other systems are reviewed and negative.    PHYSICAL EXAM: VS:  BP 124/74   Pulse (!) 52   Ht 6\' 1"  (1.854 m)   Wt 232 lb 3.2 oz (105.3 kg)   BMI 30.64 kg/m  , BMI Body mass index is 30.64 kg/m.  GENERAL:   Well appearing NECK:  No jugular venous distention, waveform within normal limits, carotid upstroke brisk and symmetric, no bruits, no thyromegaly LUNGS:  Clear to auscultation bilaterally CHEST:  Unremarkable HEART:  PMI not displaced or sustained,S1 and S2 within normal limits, no S3, no S4, no clicks, no rubs, very soft systolic murmur, no diastolic murmurs ABD:  Flat, positive bowel sounds normal in frequency in pitch, no bruits, no rebound, no guarding, no midline pulsatile mass, no hepatomegaly, no splenomegaly EXT:  2 plus pulses throughout, no edema, no cyanosis no clubbing   EKG:  EKG  ordered today. Sinus rhythm, rate 52, left anterior fascicular block, left ventricular hypertrophy, no acute T-wave changes.   Recent Labs: 04/19/2018: ALT 14; BUN 16; Creatinine, Ser 1.26; Potassium 3.7; Sodium 139    Lipid Panel    Component Value Date/Time   CHOL 151 02/21/2017 1131   TRIG 129 02/21/2017 1131   HDL 34 (L) 02/21/2017 1131   CHOLHDL 4.4 02/21/2017 1131   CHOLHDL 3.8 07/22/2016 0842   VLDL 16 07/22/2016 0842   LDLCALC 91 02/21/2017 1131      Wt Readings from Last 3 Encounters:  07/18/18 232 lb 3.2 oz (105.3 kg)  04/19/18 232 lb 6.4 oz (105.4 kg)  03/24/18 233 lb (105.7 kg)  Other studies Reviewed: Additional studies/ records that were reviewed today include: None Review of the above records demonstrates:     ASSESSMENT AND PLAN:  TR:  He was found to have moderate TR echo.  Clinically I do not suspect that it is severe.  Exam is not particularly remarkable and he has no symptoms.  I will continue to follow this clinically.  No further imaging at this time.   HTN:  BP is controlled.  His meds are being adjusted by Doreene Nest, MD.  He does have some diastolic dysfunction.  We talked about the continued need for optimal BP control.    Current medicines are reviewed at length with the patient today.  The patient does not have concerns regarding  medicines.  The following changes have been made:   None  Labs/ tests ordered today include: None  Orders Placed This Encounter  Procedures  . EKG 12-Lead     Disposition:   FU with 2 years.     Signed, Jeffrey Breeding, MD  07/18/2018 2:06 PM    [place]

## 2018-07-18 ENCOUNTER — Encounter: Payer: Self-pay | Admitting: Cardiology

## 2018-07-18 ENCOUNTER — Ambulatory Visit (INDEPENDENT_AMBULATORY_CARE_PROVIDER_SITE_OTHER): Payer: BC Managed Care – PPO | Admitting: Cardiology

## 2018-07-18 VITALS — BP 124/74 | HR 52 | Ht 73.0 in | Wt 232.2 lb

## 2018-07-18 DIAGNOSIS — I361 Nonrheumatic tricuspid (valve) insufficiency: Secondary | ICD-10-CM | POA: Diagnosis not present

## 2018-07-18 DIAGNOSIS — I10 Essential (primary) hypertension: Secondary | ICD-10-CM | POA: Diagnosis not present

## 2018-07-18 NOTE — Patient Instructions (Signed)

## 2018-09-05 ENCOUNTER — Other Ambulatory Visit: Payer: Self-pay | Admitting: Family Medicine

## 2018-11-16 ENCOUNTER — Ambulatory Visit (INDEPENDENT_AMBULATORY_CARE_PROVIDER_SITE_OTHER): Payer: BC Managed Care – PPO | Admitting: Family Medicine

## 2018-11-16 ENCOUNTER — Encounter: Payer: Self-pay | Admitting: Family Medicine

## 2018-11-16 ENCOUNTER — Other Ambulatory Visit: Payer: Self-pay

## 2018-11-16 VITALS — BP 133/75 | HR 71 | Temp 98.4°F | Ht 73.0 in | Wt 230.4 lb

## 2018-11-16 DIAGNOSIS — Z298 Encounter for other specified prophylactic measures: Secondary | ICD-10-CM | POA: Diagnosis not present

## 2018-11-16 DIAGNOSIS — I10 Essential (primary) hypertension: Secondary | ICD-10-CM

## 2018-11-16 DIAGNOSIS — Z7184 Encounter for health counseling related to travel: Secondary | ICD-10-CM

## 2018-11-16 MED ORDER — DOXYCYCLINE HYCLATE 100 MG PO TABS: 100.0000 mg | ORAL_TABLET | Freq: Every day | ORAL | 0 refills | Status: DC

## 2018-11-16 MED ORDER — LOSARTAN POTASSIUM-HCTZ 100-25 MG PO TABS: 1.0000 | ORAL_TABLET | Freq: Every day | ORAL | 1 refills | Status: DC

## 2018-11-16 NOTE — Progress Notes (Signed)
Subjective:    Patient ID: Jeffrey Perry, male    DOB: 04-08-1946, 72 y.o.   MRN: [id number]  HPI Jeffrey Perry is a 72 y.o. male Presents today for: Chief Complaint  Patient presents with  . Hypertension  . Travel Consult    to [place]    Hypertension: BP Readings from Last 3 Encounters:  11/16/18 133/75  07/18/18 124/74  04/19/18 (!) 120/58   Lab Results  Component Value Date   CREATININE 1.26 04/19/2018  Diltiazem 180 mg daily, Hyzaar 100/25 mg daily. No new side effects.  Also on alfuzosin 10mg  qd at 10 pm to help prostate. Hesitancy off meds. No new dizziness.  CKD - followed by Dr. Marval Regal. appt in August.  Hx orostate CA - followed by Dr. Louis Meckel, appt 1-2 months ago , no concerns. PSA about 0.31.  Taking losartan in afternoon has resolved dizziness.   HM: Deferring PNA vaccine until back from [place].   Travel: Flying to [place] this afternoon for 3 weeks.  Malaria prophylaxis with doxycycline in past.  Was told he had Malaria in July in [place], treated with 3 days of meds. Per CDC - chloroquine resistance only. Doxy recommended. Would lie to try doxy again.  Typhoid: vivotif in 2018 - 5 yr booster recommended.  Hep A:had 2 Hep B:had 2 Meningitis:02/2008 plans to discuss prior to next trip.  Reports having measles vaccine prior, and had measles disease.  Yellow fever - 2009 - plans to discuss prior to next trip.  tdap 4/19 Declines fu vaccine.    Immunization History  Administered Date(s) Administered  . DTaP 03/22/2008  . Gamma Globulin 06/08/1994  . H1N1 10/03/2004  . Hepatitis A 03/29/1996, 06/10/1998  . Hepatitis B 03/22/2008, 10/07/2008  . IPV 03/18/2003  . Meningococcal Conjugate 03/22/2008  . Pneumococcal Conjugate-13 05/02/2010  . Pneumococcal Polysaccharide-23 03/04/2014  . Pneumococcal-Unspecified 05/02/2010  . Tdap 03/22/2008, 03/24/2018  . Typhoid Parenteral 05/29/2002  . Yellow Fever 03/22/2008  . Zoster 03/29/2010     Patient Active Problem  List   Diagnosis Date Noted  . Murmur 07/18/2017  . Jaundice 05/03/2016  . Elevated LFTs   . Abdominal pain 04/30/2016  . Renal insufficiency 03/05/2013  . HTN (hypertension) 03/06/2012  . Prostate cancer ([place]) 10/04/2011   Past Medical History:  Diagnosis Date  . Hypertension   . Paget disease of bone    Questionable diagnosis.   . Prostate cancer [facility])    Past Surgical History:  Procedure Laterality Date  . FINGER GANGLION CYST EXCISION  2011   3rd finger right hand  . HYDROCELE EXCISION  2012   Allergies  Allergen Reactions  . Latex Itching  . Lisinopril Cough  . Neuromuscular Blocking Agents Swelling    Ankle swelling Ankle swelling  . Phenazopyridine Hcl Swelling    Ankle swelling  . Pyridium [Phenazopyridine Hcl] Swelling    Ankle swelling  . Sulfonamide Derivatives Itching   Prior to Admission medications   Medication Sig Start Date End Date Taking? Authorizing Provider  alfuzosin (UROXATRAL) 10 MG 24 hr tablet TAKE ONE TABLET BY MOUTH EVERY MORNING WITH BREAKFAST 07/14/18  Yes [name], MD  diltiazem [facility]) 180 MG 24 hr capsule Take 1 capsule (180 mg total) by mouth at bedtime. 04/19/18  Yes [name], MD  losartan-hydrochlorothiazide [facility]) 100-25 MG tablet TAKE ONE TABLET BY MOUTH DAILY 09/05/18  Yes [name], MD   Social History   Socioeconomic History  . Marital status: Married  Spouse name: Not on file  . Number of children: Not on file  . Years of education: Not on file  . Highest education level: Not on file  Occupational History  . 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 Financial resource strain: Not on file  . Food insecurity:    Worry: Not on file    Inability: Not on file  . Transportation needs:    Medical: Not on file    Non-medical: Not on file  Tobacco Use  . Smoking status: Never Smoker  . Smokeless tobacco: Never Used  Substance and Sexual Activity  . Alcohol use: No  . Drug use:  No  . Sexual activity: Yes    Partners: Female  Lifestyle  . Physical activity:    Days per week: Not on file    Minutes per session: Not on file  . Stress: Not on file  Relationships  . Social connections:    Talks on phone: Not on file    Gets together: Not on file    Attends religious service: Not on file    Active member of club or organization: Not on file    Attends meetings of clubs or organizations: Not on file    Relationship status: Not on file  . Intimate partner violence:    Fear of current or ex partner: Not on file    Emotionally abused: Not on file    Physically abused: Not on file    Forced sexual activity: Not on file  Other Topics Concern  . Not on file  Social History Narrative  . Not on file    Review of Systems  Constitutional: Negative for fatigue and unexpected weight change.  Eyes: Negative for visual disturbance.  Respiratory: Negative for cough, chest tightness and shortness of breath.   Cardiovascular: Negative for chest pain, palpitations and leg swelling.  Gastrointestinal: Negative for abdominal pain and blood in stool.  Neurological: Negative for dizziness, light-headedness and headaches.       Objective:   Physical Exam Vitals signs reviewed.  Constitutional:      Appearance: He is well-developed.  HENT:     Head: Normocephalic and atraumatic.  Eyes:     Pupils: Pupils are equal, round, and reactive to light.  Neck:     Vascular: No carotid bruit or JVD.  Cardiovascular:     Rate and Rhythm: Normal rate and regular rhythm.     Heart sounds: Normal heart sounds. No murmur.  Pulmonary:     Effort: Pulmonary effort is normal.     Breath sounds: Normal breath sounds. No rales.  Skin:    General: Skin is warm and dry.  Neurological:     Mental Status: He is alert and oriented to person, place, and time.    Vitals:   11/16/18 0902  BP: 133/75  Pulse: 71  Temp: 98.4 F (36.9 C)  TempSrc: Oral  SpO2: 98%  Weight: 230 lb 6.4  oz (104.5 kg)  Height: 6\' 1"  (1.854 m)       Assessment & Plan:   Jeffrey Perry is a 72 y.o. male Essential hypertension  -  Stable, tolerating current regimen.  Travel advice encounter - Plan: doxycycline (VIBRA-TABS) 100 MG tablet Need for malaria prophylaxis - Plan: doxycycline (VIBRA-TABS) 100 MG tablet  -Insect repellent discussed, immunization reviewed as above.  May need some updates on meningitis vaccine, yellow fever vaccine, but as leaving today these were deferred.  He  plans on follow-up with me in 3 months to review those again.  He should be up-to-date on typhoid as should not need booster for 5 years from last.  Doxycycline once per day for malaria prophylaxis.  Meds ordered this encounter  Medications  . doxycycline (VIBRA-TABS) 100 MG tablet    Sig: Take 1 tablet (100 mg total) by mouth daily. Continue for 30 days after returning from travel.    Dispense:  60 tablet    Refill:  0   Patient Instructions   No change in medications for now.  Good luck on your upcoming trip.  Doxycycline once per day to lessen risk of malaria, but make sure to use insect repellent.  Follow-up with me in the next 3 months and we can review other immunizations prior to next trip, including pneumonia vaccine.   If you have lab work done today you will be contacted with your lab results within the next 2 weeks.  If you have not heard from [place] then please contact us. The fastest way to get your results is to register for My Chart.   IF you received an x-ray today, you will receive an invoice from [facility]. Please contact [facility] at [phone] with questions or concerns regarding your invoice.   IF you received labwork today, you will receive an invoice from [facility]. Please contact [facility] at [phone] with questions or concerns regarding your invoice.   Our billing staff will not be able to assist you with questions regarding bills from these companies.  You will  be contacted with the lab results as soon as they are available. The fastest way to get your results is to activate your My Chart account. Instructions are located on the last page of this paperwork. If you have not heard from [place] regarding the results in 2 weeks, please contact this office.       Signed,   [name], MD [facility] [place].  11/16/18 10:00 AM

## 2018-11-16 NOTE — Patient Instructions (Addendum)
No change in medications for now.  Good luck on your upcoming trip.  Doxycycline once per day to lessen risk of malaria, but make sure to use insect repellent.  Follow-up with me in the next 3 months and we can review other immunizations prior to next trip, including pneumonia vaccine.   If you have lab work done today you will be contacted with your lab results within the next 2 weeks.  If you have not heard from [place] then please contact us. The fastest way to get your results is to register for My Chart.   IF you received an x-ray today, you will receive an invoice from [facility]. Please contact [facility] Radiology at [phone] with questions or concerns regarding your invoice.   IF you received labwork today, you will receive an invoice from [place]. Please contact [facility] at [phone] with questions or concerns regarding your invoice.   Our billing staff will not be able to assist you with questions regarding bills from these companies.  You will be contacted with the lab results as soon as they are available. The fastest way to get your results is to activate your My Chart account. Instructions are located on the last page of this paperwork. If you have not heard from [place] regarding the results in 2 weeks, please contact this office.

## 2019-01-31 ENCOUNTER — Ambulatory Visit: Payer: BC Managed Care – PPO | Admitting: Family Medicine

## 2019-01-31 ENCOUNTER — Encounter: Payer: Self-pay | Admitting: Family Medicine

## 2019-01-31 ENCOUNTER — Other Ambulatory Visit: Payer: Self-pay

## 2019-01-31 VITALS — BP 132/77 | HR 65 | Temp 98.8°F | Resp 14 | Ht 73.0 in | Wt 233.0 lb

## 2019-01-31 DIAGNOSIS — Z23 Encounter for immunization: Secondary | ICD-10-CM

## 2019-01-31 DIAGNOSIS — I10 Essential (primary) hypertension: Secondary | ICD-10-CM | POA: Diagnosis not present

## 2019-01-31 DIAGNOSIS — Z298 Encounter for other specified prophylactic measures: Secondary | ICD-10-CM

## 2019-01-31 MED ORDER — ALFUZOSIN HCL ER 10 MG PO TB24: ORAL_TABLET | ORAL | 2 refills | Status: DC

## 2019-01-31 MED ORDER — DILTIAZEM HCL ER BEADS 180 MG PO CP24: 180.0000 mg | ORAL_CAPSULE | Freq: Every day | ORAL | 2 refills | Status: DC

## 2019-01-31 MED ORDER — DOXYCYCLINE HYCLATE 100 MG PO TABS: 100.0000 mg | ORAL_TABLET | Freq: Every day | ORAL | 0 refills | Status: DC

## 2019-01-31 MED ORDER — LOSARTAN POTASSIUM-HCTZ 100-25 MG PO TABS: 1.0000 | ORAL_TABLET | Freq: Every day | ORAL | 1 refills | Status: DC

## 2019-01-31 NOTE — Patient Instructions (Addendum)
http://trevino.com/ for travel recommendations.  Doxycycline once per day when traveling and continue for 30 days once you have returned for malaria prophylaxis.   No medication changes for now. recheck in 6 months.    If you have lab work done today you will be contacted with your lab results within the next 2 weeks.  If you have not heard from [place] then please contact us. The fastest way to get your results is to register for My Chart.   IF you received an x-ray today, you will receive an invoice from [facility] Rad[facility]. Please contact [facility]iology at [phone] with questions or concerns regarding your invoice.   IF you received labwork today, you will receive an invoice from [place]. Please contact [facility] at [phone] with questions or concerns regarding your invoice.   Our billing staff will not be able to assist you with questions regarding bills from these companies.  You will be contacted with the lab results as soon as they are available. The fastest way to get your results is to activate your My Chart account. Instructions are located on the last page of this paperwork. If you have not heard from [place] regarding the results in 2 weeks, please contact this office.

## 2019-01-31 NOTE — Progress Notes (Signed)
Subjective:    Patient ID: Jeffrey Perry, male    DOB: 1946-09-29, 73 y.o.   MRN: [id number]  HPI Jeffrey Perry is a 73 y.o. male Presents today for: Chief Complaint  Patient presents with  . Hypertension    3 mon f/u on hypertension and need refill on alfuzosin and need pneumonia shot   Hypertension: BP Readings from Last 3 Encounters:  01/31/19 132/77  11/16/18 133/75  07/18/18 124/74   Lab Results  Component Value Date   CREATININE 1.26 04/19/2018  no home readings.  Tolerating hyzaar 100/25, diltiazem 180mg , alfuzosin (aslo used for LUTS with prior prostate CA - tried stopping, but had to restart, followed by urology).  Will be traveling again to [place] in May. 6 weeks. Tolerated doxy last trip. Defers meningitis vaccine.   Immunization History  Administered Date(s) Administered  . DTaP 03/22/2008  . Gamma Globulin 06/08/1994  . H1N1 10/03/2004  . Hepatitis A 03/29/1996, 06/10/1998  . Hepatitis B 03/22/2008, 10/07/2008  . IPV 03/18/2003  . Meningococcal Conjugate 03/22/2008  . Pneumococcal Conjugate-13 05/02/2010, 01/31/2019  . Pneumococcal Polysaccharide-23 03/04/2014  . Pneumococcal-Unspecified 05/02/2010  . Tdap 03/22/2008, 03/24/2018  . Typhoid Parenteral 05/29/2002  . Yellow Fever 03/22/2008  . Zoster 03/29/2010     Patient Active Problem List   Diagnosis Date Noted  . Murmur 07/18/2017  . Jaundice 05/03/2016  . Elevated LFTs   . Abdominal pain 04/30/2016  . Renal insufficiency 03/05/2013  . HTN (hypertension) 03/06/2012  . Prostate cancer ([place]) 10/04/2011   Past Medical History:  Diagnosis Date  . Hypertension   . Paget disease of bone    Questionable diagnosis.   . Prostate cancer [facility])    Past Surgical History:  Procedure Laterality Date  . FINGER GANGLION CYST EXCISION  2011   3rd finger right hand  . HYDROCELE EXCISION  2012   Allergies  Allergen Reactions  . Latex Itching  . Lisinopril Cough  . Neuromuscular Blocking Agents Swelling    Ankle swelling Ankle swelling  . Phenazopyridine Hcl Swelling    Ankle swelling  . Pyridium [Phenazopyridine Hcl] Swelling    Ankle swelling  . Sulfonamide Derivatives Itching   Prior to Admission medications   Medication Sig Start Date End Date Taking? Authorizing Provider  alfuzosin (UROXATRAL) 10 MG 24 hr tablet TAKE ONE TABLET BY MOUTH EVERY MORNING WITH BREAKFAST 07/14/18  Yes [name], MD  diltiazem [facility]) 180 MG 24 hr capsule Take 1 capsule (180 mg total) by mouth at bedtime. 04/19/18  Yes [name], MD  losartan-hydrochlorothiazide (HYZAAR) 100-25 MG tablet Take 1 tablet by mouth daily. 11/16/18  Yes [name], MD   Social History   Socioeconomic History  . Marital status: Married    Spouse name: Not on file  . Number of children: Not on file  . Years of education: Not on file  . Highest education level: Not on file  Occupational History  . 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 Financial resource strain: Not on file  . Food insecurity:    Worry: Not on file    Inability: Not on file  . Transportation needs:    Medical: Not on file    Non-medical: Not on file  Tobacco Use  . Smoking status: Never Smoker  . Smokeless tobacco: Never Used  Substance and Sexual Activity  . Alcohol use: No  . Drug use: No  . Sexual activity: Yes    Partners:  Female  Lifestyle  . Physical activity:    Days per week: Not on file    Minutes per session: Not on file  . Stress: Not on file  Relationships  . Social connections:    Talks on phone: Not on file    Gets together: Not on file    Attends religious service: Not on file    Active member of club or organization: Not on file    Attends meetings of clubs or organizations: Not on file    Relationship status: Not on file  . Intimate partner violence:    Fear of current or ex partner: Not on file    Emotionally abused: Not on file    Physically abused: Not on file    Forced sexual  activity: Not on file  Other Topics Concern  . Not on file  Social History Narrative  . Not on file    Review of Systems  Constitutional: Negative for fatigue and unexpected weight change.  Eyes: Negative for visual disturbance.  Respiratory: Negative for cough, chest tightness and shortness of breath.   Cardiovascular: Negative for chest pain, palpitations and leg swelling.  Gastrointestinal: Negative for abdominal pain and blood in stool.  Neurological: Negative for dizziness, light-headedness and headaches.       Objective:   Physical Exam Vitals signs reviewed.  Constitutional:      Appearance: He is well-developed.  HENT:     Head: Normocephalic and atraumatic.     Right Ear: External ear normal.     Left Ear: External ear normal.  Eyes:     Conjunctiva/sclera: Conjunctivae normal.     Pupils: Pupils are equal, round, and reactive to light.  Neck:     Musculoskeletal: Normal range of motion and neck supple.     Thyroid: No thyromegaly.  Cardiovascular:     Rate and Rhythm: Normal rate and regular rhythm.     Heart sounds: Normal heart sounds.  Pulmonary:     Effort: Pulmonary effort is normal. No respiratory distress.     Breath sounds: Normal breath sounds. No wheezing.  Abdominal:     General: There is no distension.     Palpations: Abdomen is soft.     Tenderness: There is no abdominal tenderness.     Hernia: There is no hernia in the right inguinal area or left inguinal area.  Genitourinary:    Prostate: Normal.  Musculoskeletal: Normal range of motion.        General: No tenderness.  Lymphadenopathy:     Cervical: No cervical adenopathy.  Skin:    General: Skin is warm and dry.  Neurological:     Mental Status: He is alert and oriented to person, place, and time.     Deep Tendon Reflexes: Reflexes are normal and symmetric.  Psychiatric:        Behavior: Behavior normal.     Vitals:   01/31/19 0831  BP: 132/77  Pulse: 65  Resp: 14  Temp: 98.8 F  (37.1 C)  TempSrc: Oral  SpO2: 99%  Weight: 233 lb (105.7 kg)  Height: 6\' 1"  (1.854 m)       Assessment & Plan:   [name] is a 73 y.o. male Essential hypertension - Plan: Comprehensive metabolic panel  -  Stable, tolerating current regimen. Medications refilled. Labs pending as above.   Need for prophylactic vaccination against Streptococcus pneumoniae (pneumococcus) - Plan: Pneumococcal conjugate vaccine 13-valent  Need for malaria prophylaxis - Plan: doxycycline (VIBRA-TABS)  100 MG tablet  -CDC.gov for travel information.  Doxycycline for malaria prophylaxis.  Tolerated previously.  Continue for 30 days once he is returned  Meds ordered this encounter  Medications  . doxycycline (VIBRA-TABS) 100 MG tablet    Sig: Take 1 tablet (100 mg total) by mouth daily.    Dispense:  75 tablet    Refill:  0   Patient Instructions   CDC.gov for travel recommendations.  Doxycycline once per day when traveling and continue for 30 days once you have returned for malaria prophylaxis.   No medication changes for now. recheck in 6 months.    If you have lab work done today you will be contacted with your lab results within the next 2 weeks.  If you have not heard from [place] then please contact us. The fastest way to get your results is to register for My Chart.   IF you received an x-ray today, you will receive an invoice from [facility] Rad[facility]. Please contact [facility] Radiology at [phone] with questions or concerns regarding your invoice.   IF you received labwork today, you will receive an invoice from [place]. Please contact [facility] at [phone] with questions or concerns regarding your invoice.   Our billing staff will not be able to assist you with questions regarding bills from these companies.  You will be contacted with the lab results as soon as they are available. The fastest way to get your results is to activate your My Chart account. Instructions are located on  the last page of this paperwork. If you have not heard from [place] regarding the results in 2 weeks, please contact this office.       Signed,   [name], MD [facility] [place].  01/31/19 9:19 AM

## 2019-02-01 LAB — COMPREHENSIVE METABOLIC PANEL
ALBUMIN: 4.2 g/dL (ref 3.7–4.7)
ALK PHOS: 249 IU/L — AB (ref 39–117)
ALT: 23 IU/L (ref 0–44)
AST: 31 IU/L (ref 0–40)
Albumin/Globulin Ratio: 1.6 (ref 1.2–2.2)
BILIRUBIN TOTAL: 0.5 mg/dL (ref 0.0–1.2)
BUN / CREAT RATIO: 13 (ref 10–24)
BUN: 18 mg/dL (ref 8–27)
CO2: 21 mmol/L (ref 20–29)
CREATININE: 1.43 mg/dL — AB (ref 0.76–1.27)
Calcium: 9.2 mg/dL (ref 8.6–10.2)
Chloride: 103 mmol/L (ref 96–106)
GFR calc Af Amer: 57 mL/min/{1.73_m2} — ABNORMAL LOW (ref >59)
GFR calc non Af Amer: 49 mL/min/{1.73_m2} — ABNORMAL LOW (ref >59)
GLOBULIN, TOTAL: 2.7 g/dL (ref 1.5–4.5)
Glucose: 92 mg/dL (ref 65–99)
Potassium: 3.6 mmol/L (ref 3.5–5.2)
SODIUM: 139 mmol/L (ref 134–144)
Total Protein: 6.9 g/dL (ref 6.0–8.5)

## 2019-02-12 ENCOUNTER — Encounter: Payer: Self-pay | Admitting: Radiology

## 2019-04-26 ENCOUNTER — Other Ambulatory Visit: Payer: Self-pay | Admitting: Family Medicine

## 2019-04-26 DIAGNOSIS — I10 Essential (primary) hypertension: Secondary | ICD-10-CM

## 2019-05-16 ENCOUNTER — Telehealth: Payer: Self-pay | Admitting: *Deleted

## 2019-05-16 NOTE — Telephone Encounter (Signed)
A message was left, re: follow up visit. 

## 2019-08-09 ENCOUNTER — Ambulatory Visit (INDEPENDENT_AMBULATORY_CARE_PROVIDER_SITE_OTHER): Payer: Medicare Other | Admitting: Family Medicine

## 2019-08-09 ENCOUNTER — Other Ambulatory Visit: Payer: Self-pay

## 2019-08-09 ENCOUNTER — Encounter: Payer: Self-pay | Admitting: Family Medicine

## 2019-08-09 VITALS — BP 136/78 | HR 57 | Temp 98.2°F | Resp 16 | Wt 239.4 lb

## 2019-08-09 DIAGNOSIS — N189 Chronic kidney disease, unspecified: Secondary | ICD-10-CM | POA: Diagnosis not present

## 2019-08-09 DIAGNOSIS — I10 Essential (primary) hypertension: Secondary | ICD-10-CM | POA: Diagnosis not present

## 2019-08-09 DIAGNOSIS — R748 Abnormal levels of other serum enzymes: Secondary | ICD-10-CM

## 2019-08-09 MED ORDER — DILTIAZEM HCL ER BEADS 180 MG PO CP24: 180.0000 mg | ORAL_CAPSULE | Freq: Every day | ORAL | 2 refills | Status: DC

## 2019-08-09 MED ORDER — LOSARTAN POTASSIUM-HCTZ 100-25 MG PO TABS: 1.0000 | ORAL_TABLET | Freq: Every day | ORAL | 1 refills | Status: DC

## 2019-08-09 NOTE — Progress Notes (Signed)
Subjective:    Patient ID: Jeffrey Perry, male    DOB: [birth year], 73 y.o.   MRN: [id number]  HPI Jeffrey Perry is a 73 y.o. male Presents today for:  Hypertension: BP Readings from Last 3 Encounters:  08/09/19 136/78  01/31/19 132/77  11/16/18 133/75   Lab Results  Component Value Date   CREATININE 1.43 (H) 01/31/2019  diltiazem 154m qd. Losartan hctz 100/216mQD Home readings:  109/64.  No new side effects.  Less exercise- every other day now, vs. 5d per week.  Cardiology: Dr. HoPercival Spanish appt next week.   Lipid screening  Lab Results  Component Value Date   CHOL 151 02/21/2017   HDL 34 (L) 02/21/2017   LDLCALC 91 02/21/2017   TRIG 129 02/21/2017   CHOLHDL 4.4 02/21/2017   Lab Results  Component Value Date   ALT 23 01/31/2019   AST 31 01/31/2019   ALKPHOS 249 (H) 01/31/2019   BILITOT 0.5 01/31/2019   History of elevated Alk phos -evaluated previously by gastroenterology, rheumatology without specific follow-up needed. 249 in March, down from 280 in 2019.   appt in October with urologist, hx of prostate CA, alfuzosin has been doing well for urination.   CKD - 3 Nephrology eval in June.  Cr 1.2-1.4.  Appt in February.  Considering travel out of country in few months - depending on pandemic.    Patient Active Problem List   Diagnosis Date Noted  . Murmur 07/18/2017  . Jaundice 05/03/2016  . Elevated LFTs   . Abdominal pain 04/30/2016  . Renal insufficiency 03/05/2013  . HTN (hypertension) 03/06/2012  . [place]state cancer [place]   Past Medical History:  Diagnosis Date  . Hypertension   . Paget disease of bone    Questionable diagnosis.   . [facility]   Past Surgical History:  Procedure Laterality Date  . FINGER GANGLION CYST EXCISION  2011   3rd finger right hand  . HYDROCELE EXCISION  2012   Allergies  Allergen Reactions  . Latex Itching  . Lisinopril Cough  . Neuromuscular Blocking Agents Swelling    Ankle swelling Ankle  swelling  . Phenazopyridine Hcl Swelling    Ankle swelling  . Pyridium [Phenazopyridine Hcl] Swelling    Ankle swelling  . Sulfonamide Derivatives Itching   Prior to Admission medications   Medication Sig Start Date End Date Taking? Authorizing Provider  AMOXICILLIN PO Take 100 mg by mouth.   Yes [provider]  alfuzosin (UROXATRAL) 10 MG 24 hr tablet TAKE ONE TABLET BY MOUTH EVERY MORNING WITH BREAKFAST 04/26/19   GrWendie AgresteMD  diltiazem ([facility] MG 24 hr capsule Take 1 capsule (180 mg total) by mouth at bedtime. 01/31/19   GrWendie AgresteMD  doxycycline (VIBRA-TABS) 100 MG tablet Take 1 tablet (100 mg total) by mouth daily. 01/31/19   GrWendie AgresteMD  losartan-hydrochlorothiazide (HYZAAR) 100-25 MG tablet Take 1 tablet by mouth daily. 01/31/19   GrWendie AgresteMD   Social History   Socioeconomic History  . Marital status: Married    Spouse name: Not on file  . Number of children: Not on file  . Years of education: Not on file  . Highest education level: Not on file  Occupational History  . 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 Financial resource strain: Not on file  . Food insecurity    Worry: Not on file    Inability:  Not on file  . Transportation needs    Medical: Not on file    Non-medical: Not on file  Tobacco Use  . Smoking status: Never Smoker  . Smokeless tobacco: Never Used  Substance and Sexual Activity  . Alcohol use: No  . Drug use: No  . Sexual activity: Yes    Partners: Female  Lifestyle  . Physical activity    Days per week: Not on file    Minutes per session: Not on file  . Stress: Not on file  Relationships  . Social Herbalist on phone: Not on file    Gets together: Not on file    Attends religious service: Not on file    Active member of club or organization: Not on file    Attends meetings of clubs or organizations: Not on file    Relationship status: Not on file  . Intimate partner  violence    Fear of current or ex partner: Not on file    Emotionally abused: Not on file    Physically abused: Not on file    Forced sexual activity: Not on file  Other Topics Concern  . Not on file  Social History Narrative  . Not on file    Review of Systems  Constitutional: Negative for fatigue and unexpected weight change (weight gain with pandemic. less exercise. ).  Eyes: Negative for visual disturbance.  Respiratory: Negative for cough, chest tightness and shortness of breath.   Cardiovascular: Negative for chest pain, palpitations and leg swelling.  Gastrointestinal: Negative for abdominal pain and blood in stool.  Neurological: Negative for dizziness, light-headedness and headaches.       Objective:   Physical Exam Vitals signs reviewed.  Constitutional:      Appearance: He is well-developed.  HENT:     Head: Normocephalic and atraumatic.  Eyes:     Pupils: Pupils are equal, round, and reactive to light.  Neck:     Vascular: No carotid bruit or JVD.  Cardiovascular:     Rate and Rhythm: Normal rate and regular rhythm.     Heart sounds: Normal heart sounds. No murmur.  Pulmonary:     Effort: Pulmonary effort is normal.     Breath sounds: Normal breath sounds. No rales.  Abdominal:     General: There is no distension.     Tenderness: There is no abdominal tenderness.  Skin:    General: Skin is warm and dry.  Neurological:     Mental Status: He is alert and oriented to person, place, and time.    Vitals:   08/09/19 0919  BP: 136/78  Pulse: (!) 57  Resp: 16  Temp: 98.2 F (36.8 C)  TempSrc: Oral  SpO2: 100%  Weight: 239 lb 6.4 oz (108.6 kg)          Assessment & Plan:   Jeffrey Perry is a 73 y.o. male Essential hypertension - Plan: Comprehensive metabolic panel, diltiazem (TIAZAC) 180 MG 24 hr capsule, losartan-hydrochlorothiazide (HYZAAR) 100-25 MG tablet  -  Stable, tolerating current regimen. Medications refilled. Labs pending as above.    Chronic kidney disease, unspecified CKD stage - Plan: Comprehensive metabolic panel   -Continue follow-up with nephrology.  BP well controlled.  Elevated alkaline phosphatase level - Plan: Comprehensive metabolic panel  -As above has had evaluation with rheumatology, GI in the past.  Monitor for stability with testing above.   Meds ordered this encounter  Medications  . diltiazem (TIAZAC) 180  MG 24 hr capsule    Sig: Take 1 capsule (180 mg total) by mouth at bedtime.    Dispense:  90 capsule    Refill:  2  . losartan-hydrochlorothiazide (HYZAAR) 100-25 MG tablet    Sig: Take 1 tablet by mouth daily.    Dispense:  90 tablet    Refill:  1   Patient Instructions     No medication changes today, especially with home blood pressure readings.  Keep follow-up with cardiology, urology as planned.  Let me know if there are questions.  Take care   If you have lab work done today you will be contacted with your lab results within the next 2 weeks.  If you have not heard from [place] then please contact us. The fastest way to get your results is to register for My Chart.   IF you received an x-ray today, you will receive an invoice from [facility]. Please contact [facility] at [phone] with questions or concerns regarding your invoice.   IF you received labwork today, you will receive an invoice from [place]. Please contact [facility] at [phone] with questions or concerns regarding your invoice.   Our billing staff will not be able to assist you with questions regarding bills from these companies.  You will be contacted with the lab results as soon as they are available. The fastest way to get your results is to activate your My Chart account. Instructions are located on the last page of this paperwork. If you have not heard from [place] regarding the results in 2 weeks, please contact this office.       Signed,   [name], MD [facility].  08/09/19 1:09 PM

## 2019-08-09 NOTE — Patient Instructions (Addendum)
   No medication changes today, especially with home blood pressure readings.  Keep follow-up with cardiology, urology as planned.  Let me know if there are questions.  Take care   If you have lab work done today you will be contacted with your lab results within the next 2 weeks.  If you have not heard from [place] then please contact us. The fastest way to get your results is to register for My Chart.   IF you received an x-ray today, you will receive an invoice from [facility]. Please contact [facility] at [phone] with questions or concerns regarding your invoice.   IF you received labwork today, you will receive an invoice from [place]. Please contact [facility] at [phone] with questions or concerns regarding your invoice.   Our billing staff will not be able to assist you with questions regarding bills from these companies.  You will be contacted with the lab results as soon as they are available. The fastest way to get your results is to activate your My Chart account. Instructions are located on the last page of this paperwork. If you have not heard from [place] regarding the results in 2 weeks, please contact this office.

## 2019-08-10 LAB — COMPREHENSIVE METABOLIC PANEL
ALT: 15 IU/L (ref 0–44)
AST: 22 IU/L (ref 0–40)
Albumin/Globulin Ratio: 1.4 (ref 1.2–2.2)
Albumin: 4 g/dL (ref 3.7–4.7)
Alkaline Phosphatase: 323 IU/L — ABNORMAL HIGH (ref 39–117)
BUN/Creatinine Ratio: 13 (ref 10–24)
BUN: 15 mg/dL (ref 8–27)
Bilirubin Total: 0.3 mg/dL (ref 0.0–1.2)
CO2: 22 mmol/L (ref 20–29)
Calcium: 9.2 mg/dL (ref 8.6–10.2)
Chloride: 99 mmol/L (ref 96–106)
Creatinine, Ser: 1.18 mg/dL (ref 0.76–1.27)
GFR calc Af Amer: 71 mL/min/{1.73_m2} (ref >59)
GFR calc non Af Amer: 62 mL/min/{1.73_m2} (ref >59)
Globulin, Total: 2.9 g/dL (ref 1.5–4.5)
Glucose: 88 mg/dL (ref 65–99)
Potassium: 3.7 mmol/L (ref 3.5–5.2)
Sodium: 134 mmol/L (ref 134–144)
Total Protein: 6.9 g/dL (ref 6.0–8.5)

## 2019-08-12 NOTE — Progress Notes (Signed)
Cardiology Office Note   Date:  08/14/2019   ID:  Jeffrey Perry, DOB 18-May-1946, MRN [id number]  PCP:  Patient, No Pcp Per  Cardiologist:   Minus Breeding, MD   Chief Complaint  Patient presents with  . Tricuspid Regurgitation      History of Present Illness: Jeffrey Perry is a 73 y.o. male who presents for evaluation of HTN and a heart murmur.   He was found to have TR.   Since I last saw him he has done well. The patient denies any new symptoms such as chest discomfort, neck or arm discomfort. There has been no new shortness of breath, PND or orthopnea. There have been no reported palpitations, presyncope or syncope.  He walks about 5 miles per day.  He gets a little short of breath when he climbs up a hill.  He might get a little left greater than right leg swelling.  Otherwise he does really well.   Past Medical History:  Diagnosis Date  . Hypertension   . Paget disease of bone    Questionable diagnosis.   . Prostate cancer [facility])     Past Surgical History:  Procedure Laterality Date  . FINGER GANGLION CYST EXCISION  2011   3rd finger right hand  . HYDROCELE EXCISION  2012     Current Outpatient Medications  Medication Sig Dispense Refill  . alfuzosin (UROXATRAL) 10 MG 24 hr tablet TAKE ONE TABLET BY MOUTH EVERY MORNING WITH BREAKFAST 90 tablet 0  . diltiazem (TIAZAC) 180 MG 24 hr capsule Take 1 capsule (180 mg total) by mouth at bedtime. 90 capsule 2  . losartan-hydrochlorothiazide (HYZAAR) 100-25 MG tablet Take 1 tablet by mouth daily. 90 tablet 1  . AMOXICILLIN PO Take 100 mg by mouth.     No current facility-administered medications for this visit.     Allergies:   Latex, Lisinopril, Neuromuscular blocking agents, Phenazopyridine hcl, Pyridium [phenazopyridine hcl], and Sulfonamide derivatives    ROS:  Please see the history of present illness.   Otherwise, review of systems are positive for none.   All other systems are reviewed and negative.    PHYSICAL EXAM:  VS:  BP (!) 142/86   Pulse 62   Temp (!) 97.3 F (36.3 C)   Ht 6\' 1"  (1.854 m)   Wt 236 lb 14.4 oz (107.5 kg)   SpO2 97%   BMI 31.26 kg/m  , BMI Body mass index is 31.26 kg/m.  GENERAL:  Well appearing NECK:  No jugular venous distention, waveform within normal limits, carotid upstroke brisk and symmetric, no bruits, no thyromegaly LUNGS:  Clear to auscultation bilaterally CHEST:  Unremarkable HEART:  PMI not displaced or sustained,S1 and S2 within normal limits, no S3, no S4, no clicks, no rubs, no murmurs ABD:  Flat, positive bowel sounds normal in frequency in pitch, no bruits, no rebound, no guarding, no midline pulsatile mass, no hepatomegaly, no splenomegaly EXT:  2 plus pulses throughout, trace left greater than right leg edema, no cyanosis no clubbing   EKG:  EKG  ordered today. Sinus rhythm, rate 53, left anterior fascicular block, left ventricular hypertrophy, no acute T-wave changes.  No change from previous   Recent Labs: 08/09/2019: ALT 15; BUN 15; Creatinine, Ser 1.18; Potassium 3.7; Sodium 134    Lipid Panel    Component Value Date/Time   CHOL 151 02/21/2017 1131   TRIG 129 02/21/2017 1131   HDL 34 (L) 02/21/2017 1131   CHOLHDL 4.4 02/21/2017  1131   CHOLHDL 3.8 07/22/2016 0842   VLDL 16 07/22/2016 0842   LDLCALC 91 02/21/2017 1131      Wt Readings from Last 3 Encounters:  08/14/19 236 lb 14.4 oz (107.5 kg)  08/09/19 239 lb 6.4 oz (108.6 kg)  01/31/19 233 lb (105.7 kg)      Other studies Reviewed: Additional studies/ records that were reviewed today include: Previous echo Review of the above records demonstrates:  See above   ASSESSMENT AND PLAN:  TR:    I would not suspect that he has pulmonary hypertension or secondary cause for his TR.  I will repeat an echo as it has been 2 years.  I probably will follow this clinically.  We discussed today the physiology.  He is having no symptoms that would suggest a problem.  HTN:  BP is slightly elevated  today but he says this is very unusual.  He takes his blood pressure routinely and is well controlled.  No change in therapy.   CKD II: He has very mild renal insufficiency and is followed by nephrology.  Creatinine was 1.4.  Current medicines are reviewed at length with the patient today.  The patient does not have concerns regarding medicines.  The following changes have been made:   None  Labs/ tests ordered today include:   Orders Placed This Encounter  Procedures  . EKG 12-Lead  . ECHOCARDIOGRAM COMPLETE     Disposition:   FU with me in  in 2 years.     Signed, Minus Breeding, MD  08/14/2019 2:26 PM    [facility]

## 2019-08-14 ENCOUNTER — Other Ambulatory Visit: Payer: Self-pay

## 2019-08-14 ENCOUNTER — Ambulatory Visit (INDEPENDENT_AMBULATORY_CARE_PROVIDER_SITE_OTHER): Payer: Medicare Other | Admitting: Cardiology

## 2019-08-14 ENCOUNTER — Encounter: Payer: Self-pay | Admitting: Cardiology

## 2019-08-14 VITALS — BP 142/86 | HR 62 | Temp 97.3°F | Ht 73.0 in | Wt 236.9 lb

## 2019-08-14 DIAGNOSIS — I10 Essential (primary) hypertension: Secondary | ICD-10-CM | POA: Diagnosis not present

## 2019-08-14 DIAGNOSIS — I071 Rheumatic tricuspid insufficiency: Secondary | ICD-10-CM

## 2019-08-14 NOTE — Patient Instructions (Addendum)
Medication Instructions:  Your physician recommends that you continue on your current medications as directed. Please refer to the Current Medication list given to you today.  If you need a refill on your cardiac medications before your next appointment, please call your pharmacy.   Lab work: NONE   Testing/Procedures: Your physician has requested that you have an echocardiogram. Echocardiography is a painless test that uses sound waves to create images of your heart. It provides your doctor with information about the size and shape of your heart and how well your heart's chambers and valves are working. This procedure takes approximately one hour. There are no restrictions for this procedure. [address]  Follow-Up: At [facility], you and your health needs are our priority.  As part of our continuing mission to provide you with exceptional heart care, we have created designated Provider Care Teams.  These Care Teams include your primary Cardiologist (physician) and Advanced Practice Providers (APPs -  Physician Assistants and Nurse Practitioners) who all work together to provide you with the care you need, when you need it. You will need a follow up appointment in 2 years.  Please call our office 2 months in advance to schedule this appointment.  You may see Minus Breeding, MD or one of the following Advanced Practice Providers on your designated Care Team:   Kerin Ransom, [place] Roby Lofts, PA-C [place], [place]    Echocardiogram An echocardiogram is a procedure that uses painless sound waves (ultrasound) to produce an image of the heart. Images from an echocardiogram can provide important information about:  Signs of coronary artery disease (CAD).  Aneurysm detection. An aneurysm is a weak or damaged part of an artery wall that bulges out from the normal force of blood pumping through the body.  Heart size and shape. Changes in the size or shape  of the heart can be associated with certain conditions, including heart failure, aneurysm, and CAD.  Heart muscle function.  Heart valve function.  Signs of a past heart attack.  Fluid buildup around the heart.  Thickening of the heart muscle.  A tumor or infectious growth around the heart valves. Tell a health care provider about:  Any allergies you have.  All medicines you are taking, including vitamins, herbs, eye drops, creams, and over-the-counter medicines.  Any blood disorders you have.  Any surgeries you have had.  Any medical conditions you have.  Whether you are pregnant or may be pregnant. What are the risks? Generally, this is a safe procedure. However, problems may occur, including:  Allergic reaction to dye (contrast) that may be used during the procedure. What happens before the procedure? No specific preparation is needed. You may eat and drink normally. What happens during the procedure?   An IV tube may be inserted into one of your veins.  You may receive contrast through this tube. A contrast is an injection that improves the quality of the pictures from your heart.  A gel will be applied to your chest.  A wand-like tool (transducer) will be moved over your chest. The gel will help to transmit the sound waves from the transducer.  The sound waves will harmlessly bounce off of your heart to allow the heart images to be captured in real-time motion. The images will be recorded on a computer. The procedure may vary among health care providers and hospitals. What happens after the procedure?  You may return to your normal, everyday  life, including diet, activities, and medicines, unless your health care provider tells you not to do that. Summary  An echocardiogram is a procedure that uses painless sound waves (ultrasound) to produce an image of the heart.  Images from an echocardiogram can provide important information about the size and shape of  your heart, heart muscle function, heart valve function, and fluid buildup around your heart.  You do not need to do anything to prepare before this procedure. You may eat and drink normally.  After the echocardiogram is completed, you may return to your normal, everyday life, unless your health care provider tells you not to do that. This information is not intended to replace advice given to you by your health care provider. Make sure you discuss any questions you have with your health care provider. Document Released: 11/12/2000 Document Revised: 03/08/2019 Document Reviewed: 12/18/2016 Elsevier Patient Education  2020 [employer].

## 2019-08-19 ENCOUNTER — Other Ambulatory Visit: Payer: Self-pay | Admitting: Family Medicine

## 2019-08-19 DIAGNOSIS — R748 Abnormal levels of other serum enzymes: Secondary | ICD-10-CM

## 2019-08-19 NOTE — Progress Notes (Signed)
l °

## 2019-08-21 ENCOUNTER — Other Ambulatory Visit: Payer: Self-pay

## 2019-08-21 ENCOUNTER — Ambulatory Visit (HOSPITAL_COMMUNITY): Payer: Medicare Other | Attending: Cardiology

## 2019-08-21 DIAGNOSIS — I10 Essential (primary) hypertension: Secondary | ICD-10-CM | POA: Diagnosis not present

## 2019-08-21 DIAGNOSIS — I071 Rheumatic tricuspid insufficiency: Secondary | ICD-10-CM

## 2019-11-05 ENCOUNTER — Other Ambulatory Visit: Payer: Self-pay

## 2019-11-05 ENCOUNTER — Encounter: Payer: Self-pay | Admitting: Family Medicine

## 2019-11-05 ENCOUNTER — Ambulatory Visit: Payer: Medicare Other | Admitting: Family Medicine

## 2019-11-05 VITALS — BP 130/74 | HR 73 | Temp 98.0°F | Wt 237.2 lb

## 2019-11-05 DIAGNOSIS — Z298 Encounter for other specified prophylactic measures: Secondary | ICD-10-CM | POA: Diagnosis not present

## 2019-11-05 DIAGNOSIS — Z01812 Encounter for preprocedural laboratory examination: Secondary | ICD-10-CM | POA: Diagnosis not present

## 2019-11-05 DIAGNOSIS — Z20828 Contact with and (suspected) exposure to other viral communicable diseases: Secondary | ICD-10-CM

## 2019-11-05 DIAGNOSIS — R748 Abnormal levels of other serum enzymes: Secondary | ICD-10-CM

## 2019-11-05 DIAGNOSIS — Z20822 Contact with and (suspected) exposure to covid-19: Secondary | ICD-10-CM

## 2019-11-05 LAB — ALKALINE PHOSPHATASE: Alkaline Phosphatase: 320 IU/L — ABNORMAL HIGH (ref 39–117)

## 2019-11-05 MED ORDER — DOXYCYCLINE HYCLATE 100 MG PO TABS: 100.0000 mg | ORAL_TABLET | Freq: Every day | ORAL | 0 refills | Status: DC

## 2019-11-05 MED ORDER — DOXYCYCLINE HYCLATE 100 MG PO TABS: 100.0000 mg | ORAL_TABLET | Freq: Two times a day (BID) | ORAL | 0 refills | Status: DC

## 2019-11-05 NOTE — Patient Instructions (Addendum)
  I will recheck alkaline phosphatase again, but if still significant elevation would recommend meeting with rheumatology.  Especially would recommend that if you have any new areas of bone pain or new areas of pain.  Covid test results should be back in the next few days.  Doxycycline once per day while traveling for malaria prophylaxis, continue for 30 days once you have returned.  Have a great trip and stay safe!  If you have lab work done today you will be contacted with your lab results within the next 2 weeks.  If you have not heard from [place] then please contact us. The fastest way to get your results is to register for My Chart.   IF you received an x-ray today, you will receive an invoice from [facility]. Please contact [facility] at [phone] with questions or concerns regarding your invoice.   IF you received labwork today, you will receive an invoice from [place]. Please contact [facility] at [phone] with questions or concerns regarding your invoice.   Our billing staff will not be able to assist you with questions regarding bills from these companies.  You will be contacted with the lab results as soon as they are available. The fastest way to get your results is to activate your My Chart account. Instructions are located on the last page of this paperwork. If you have not heard from [place] regarding the results in 2 weeks, please contact this office.

## 2019-11-05 NOTE — Progress Notes (Signed)
Subjective:  Patient ID: Jeffrey Perry, male    DOB: [birth year]  Age: 73 y.o. MRN: [id number]  CC:  Chief Complaint  Patient presents with   Follow-up    here to f/u and repeat lab test and to have a covid test done for his trip    HPI Jeffrey Perry presents for   Elevated alkaline phosphatase: Previously elevated, slightly higher than previous baseline in September.  Prior range 196-280.  Most recently 323.  History of Paget's. Evaluated by rheumatology most recently in March 2017.  Bone scan November 2016 stable appearance of increased sacral uptake with no findings to suggest metastatic prostate disease.  85% bone isoenzymes and low liver enzyme exams.  Possible mild degree of Paget's disease with the changes of the ALP and bone scan.  Discussion of not treating with bisphosphonates at that time.  Reportedly has had elevated alkaline phosphatase since 1984.  Pelvic x-ray March 2017 with trabecular and cortical thickening throughout the pelvis and seen with Paget's disease.   Denies new pain/bone pain. No new hip/pelvis pain. Able to do walking. Feels well.  Lab Results  Component Value Date   ALT 15 08/09/2019   AST 22 08/09/2019   ALKPHOS 323 (H) 08/09/2019   BILITOT 0.3 08/09/2019   COVID-19 screening. Traveling to [place] in 3 days.  Will be there 7 weeks.  No symptoms, no sick contacts.  Has used doxycycline for malaria prophylaxis in past. Tolerated previously.  vivotif for typhoid in 2018.   History Patient Active Problem List   Diagnosis Date Noted   Murmur 07/18/2017   Jaundice 05/03/2016   Elevated LFTs    Abdominal pain 04/30/2016   Renal insufficiency 03/05/2013   HTN (hypertension) 03/06/2012   Prostate cancer ([place]) 10/04/2011   Past Medical History:  Diagnosis Date   Hypertension    Paget disease of bone    Questionable diagnosis.    Prostate cancer [facility])    Past Surgical History:  Procedure Laterality Date   FINGER GANGLION CYST EXCISION   2011   3rd finger right hand   HYDROCELE EXCISION  2012   Allergies  Allergen Reactions   Latex Itching   Lisinopril Cough   Neuromuscular Blocking Agents Swelling    Ankle swelling Ankle swelling   Phenazopyridine Hcl Swelling    Ankle swelling   Pyridium [Phenazopyridine Hcl] Swelling    Ankle swelling   Sulfonamide Derivatives Itching   Prior to Admission medications   Medication Sig Start Date End Date Taking? Authorizing Provider  alfuzosin (UROXATRAL) 10 MG 24 hr tablet TAKE ONE TABLET BY MOUTH EVERY MORNING WITH BREAKFAST 04/26/19  Yes [name], MD  diltiazem [facility]) 180 MG 24 hr capsule Take 1 capsule (180 mg total) by mouth at bedtime. 08/09/19  Yes [name], MD  losartan-hydrochlorothiazide (HYZAAR) 100-25 MG tablet Take 1 tablet by mouth daily. 08/09/19  Yes [name], MD   Social History   Socioeconomic History   Marital status: Married    Spouse name: Not on file   Number of children: Not on file   Years of education: Not on file   Highest education level: Not on file  Occupational History   Occupation: professor    Employer: [employer]  Social Needs   Financial resource strain: Not on file   Food insecurity    Worry: Not on file    Inability: Not on file   Transportation needs    Medical: Not on file  Non-medical: Not on file  Tobacco Use   Smoking status: Never Smoker   Smokeless tobacco: Never Used  Substance and Sexual Activity   Alcohol use: No   Drug use: No   Sexual activity: Yes    Partners: Female  Lifestyle   Physical activity    Days per week: Not on file    Minutes per session: Not on file   Stress: Not on file  Relationships   Social connections    Talks on phone: Not on file    Gets together: Not on file    Attends religious service: Not on file    Active member of club or organization: Not on file    Attends meetings of clubs or organizations: Not on file    Relationship status: Not on  file   Intimate partner violence    Fear of current or ex partner: Not on file    Emotionally abused: Not on file    Physically abused: Not on file    Forced sexual activity: Not on file  Other Topics Concern   Not on file  Social History Narrative   Not on file    Review of Systems Per HPI.   Objective:   Vitals:   11/05/19 0805  BP: 130/74  Pulse: 73  Temp: 98 F (36.7 C)  TempSrc: Temporal  SpO2: 99%  Weight: 237 lb 3.2 oz (107.6 kg)     Physical Exam Vitals signs reviewed.  Constitutional:      General: He is not in acute distress.    Appearance: He is well-developed.  HENT:     Head: Normocephalic and atraumatic.  Cardiovascular:     Rate and Rhythm: Normal rate.  Pulmonary:     Effort: Pulmonary effort is normal.  Musculoskeletal:     Right hip: Normal. He exhibits normal range of motion, no tenderness and no bony tenderness.     Left hip: He exhibits normal range of motion, no tenderness and no bony tenderness.  Neurological:     Mental Status: He is alert and oriented to person, place, and time.        Assessment & Plan:  Jeffrey Perry is a 73 y.o. male . Elevated alkaline phosphatase level - Plan: Alkaline phosphatase  -History of Paget's suspected.  Denies any pain/bony pain.  Last rheumatology eval 2017.  Repeat testing, consider new rheumatology eval once he has returned from vacation.  Encounter for preprocedure screening laboratory testing for COVID-19 - Plan: Novel Coronavirus, NAA ([facility]) Encounter for screening laboratory testing for COVID-19 virus - Plan: Novel Coronavirus, NAA ([facility])  -Asymptomatic, screening testing above  Need for malaria prophylaxis - Plan: doxycycline (VIBRA-TABS) 100 MG tablet, DISCONTINUED: doxycycline (VIBRA-TABS) 100 MG tablet  -Doxycycline once per day for malaria prophylaxis, potential side effects discussed.  Tolerated previously.  Up-to-date on typhoid.  Meds ordered this encounter  Medications    DISCONTD: doxycycline (VIBRA-TABS) 100 MG tablet    Sig: Take 1 tablet (100 mg total) by mouth 2 (two) times daily.    Dispense:  80 tablet    Refill:  0   doxycycline (VIBRA-TABS) 100 MG tablet    Sig: Take 1 tablet (100 mg total) by mouth daily. For malaria prophylaxis.    Dispense:  80 tablet    Refill:  0   Patient Instructions    I will recheck alkaline phosphatase again, but if still significant elevation would recommend meeting with rheumatology.  Especially would recommend that if you have  any new areas of bone pain or new areas of pain.  Covid test results should be back in the next few days.  Doxycycline once per day while traveling for malaria prophylaxis, continue for 30 days once you have returned.  Have a great trip and stay safe!  If you have lab work done today you will be contacted with your lab results within the next 2 weeks.  If you have not heard from [place] then please contact us. The fastest way to get your results is to register for My Chart.   IF you received an x-ray today, you will receive an invoice from [facility]. Please contact [facility] at [phone] with questions or concerns regarding your invoice.   IF you received labwork today, you will receive an invoice from [facility]. Please contact [facility] at [phone] with questions or concerns regarding your invoice.   Our billing staff will not be able to assist you with questions regarding bills from these companies.  You will be contacted with the lab results as soon as they are available. The fastest way to get your results is to activate your My Chart account. Instructions are located on the last page of this paperwork. If you have not heard from [place] regarding the results in 2 weeks, please contact this office.          Signed, [name], MD [facility]

## 2019-11-06 LAB — NOVEL CORONAVIRUS, NAA: SARS-CoV-2, NAA: NOT DETECTED

## 2019-12-26 ENCOUNTER — Other Ambulatory Visit: Payer: Self-pay | Admitting: Family Medicine

## 2019-12-26 DIAGNOSIS — I10 Essential (primary) hypertension: Secondary | ICD-10-CM

## 2020-01-14 ENCOUNTER — Other Ambulatory Visit: Payer: Self-pay | Admitting: Family Medicine

## 2020-01-14 DIAGNOSIS — I10 Essential (primary) hypertension: Secondary | ICD-10-CM

## 2020-01-20 ENCOUNTER — Ambulatory Visit: Payer: Medicare PPO | Attending: Internal Medicine

## 2020-01-20 DIAGNOSIS — Z23 Encounter for immunization: Secondary | ICD-10-CM | POA: Insufficient documentation

## 2020-01-20 NOTE — Progress Notes (Signed)
   Covid-19 Vaccination Clinic  Name:  Jeffrey Perry    MRN: [id number] DOB: March 03, 1946  01/20/2020  Jeffrey Perry was observed post Covid-19 immunization for 15 minutes without incidence. He was provided with Vaccine Information Sheet and instruction to access the V-Safe system.   Jeffrey Perry was instructed to call 911 with any severe reactions post vaccine: [name] Kitchen Difficulty breathing  . Swelling of your face and throat  . A fast heartbeat  . A bad rash all over your body  . Dizziness and weakness    Immunizations Administered    Name Date Dose VIS Date Route   Pfizer COVID-19 Vaccine 01/20/2020  8:13 AM 0.3 mL 11/09/2019 Intramuscular   Manufacturer: [place]   Lot: [id number]   [place]: [id number]

## 2020-02-07 ENCOUNTER — Ambulatory Visit (INDEPENDENT_AMBULATORY_CARE_PROVIDER_SITE_OTHER): Payer: Medicare PPO | Admitting: Family Medicine

## 2020-02-07 ENCOUNTER — Other Ambulatory Visit: Payer: Self-pay

## 2020-02-07 ENCOUNTER — Encounter: Payer: Self-pay | Admitting: Family Medicine

## 2020-02-07 VITALS — BP 124/80 | HR 63 | Temp 97.9°F | Ht 73.0 in | Wt 236.0 lb

## 2020-02-07 DIAGNOSIS — Z0001 Encounter for general adult medical examination with abnormal findings: Secondary | ICD-10-CM | POA: Diagnosis not present

## 2020-02-07 DIAGNOSIS — I10 Essential (primary) hypertension: Secondary | ICD-10-CM

## 2020-02-07 DIAGNOSIS — R748 Abnormal levels of other serum enzymes: Secondary | ICD-10-CM

## 2020-02-07 DIAGNOSIS — C61 Malignant neoplasm of prostate: Secondary | ICD-10-CM

## 2020-02-07 DIAGNOSIS — Z0000 Encounter for general adult medical examination without abnormal findings: Secondary | ICD-10-CM

## 2020-02-07 MED ORDER — LOSARTAN POTASSIUM-HCTZ 100-25 MG PO TABS: 1.0000 | ORAL_TABLET | Freq: Every day | ORAL | 1 refills | Status: DC

## 2020-02-07 MED ORDER — DILTIAZEM HCL ER BEADS 180 MG PO CP24: 180.0000 mg | ORAL_CAPSULE | Freq: Every day | ORAL | 1 refills | Status: DC

## 2020-02-07 NOTE — Progress Notes (Signed)
 Subjective:  Patient ID: Jeffrey Perry, male    DOB: 05/03/1946  Age: 74 y.o. MRN: [id number]  CC:  Chief Complaint  Patient presents with  . Medicare Wellness    pt feels good today with no complaints. medication seem tobe working well.     HPI Jeffrey Perry presents for  Annual wellness exam, med review. Care team Urology, Dr. Herrick PCP, me Cardiology, Dr. Hochrein Rheum: prior Dr Truslow.   Hypertension: Losartan HCTZ 100/25 mg daily, diltiazem 180 mg daily. Home readings: no recent readings.  no new side effects.  BP Readings from Last 3 Encounters:  02/07/20 124/80  11/05/19 130/74  08/14/19 (!) 142/86   Lab Results  Component Value Date   CREATININE 1.18 08/09/2019   History of chronic elevated alkaline phosphatase, Paget's Evaluated by rheumatology, bone scan November 2016 with stable appearance of increased sacral uptake, no findings to suggest metastatic prostate disease.  85% bone isoenzymes, low liver enzyme exams.  Possible mild degree of Paget's.  Decided against bisphosphonate treatment.  Pelvic x-ray March 2017 with findings seen with Paget disease.  No new hip or bone at December 7 visit. Most recent level of 320 on December 7.  With that increase did recommend rheumatology eval [place].  No new bone pain. Did not feel well at one time in [place] - reports Alk phos 825, but unknown units.   Prostate cancer Followed [facility] Dr. [facility], visit September 18, 2019 reviewed.  Treated with external beam radiation in 2012.  PSA 0.39 in October 2020.  Previously treated and evaluated by [facility] for erectile dysfunction, hematuria.  Prior ED, treated with prosthesis.  Continued planned for annual PSA monitoring.  Continued alfuzosin for BPH with LUTS, sildenafil has been prescribed for ED. Would like to check PSA today.  Not taking sildenafil.   Fall Risk  11/05/2019 08/09/2019 01/31/2019 11/16/2018 04/19/2018  Falls in the past year? 0 0 0  0 No  Number falls in past yr: 0 0 0 - -  Injury with Fall? 0 0 0 - -  Follow up Falls evaluation completed Falls evaluation completed Falls evaluation completed - -   Cancer screening Prostate as above, colonoscopy 04/11/2013 - repeat 10 yrs.   Immunization History  Administered Date(s) Administered  . DTaP 03/22/2008  . Gamma Globulin 06/08/1994  . H1N1 10/03/2004  . Hepatitis A 03/29/1996, 06/10/1998  . Hepatitis B 03/22/2008, 10/07/2008  . IPV 03/18/2003  . Meningococcal Conjugate 03/22/2008  . PFIZER SARS-COV-2 Vaccination 01/20/2020  . Pneumococcal Conjugate-13 05/02/2010, 01/31/2019  . Pneumococcal Polysaccharide-23 03/04/2014  . Pneumococcal-Unspecified 05/02/2010  . Tdap 03/22/2008, 03/24/2018  . Typhoid Parenteral 05/29/2002  . Yellow Fever 03/22/2008  . Zoster 03/29/2010  possible shingrix - will check with [place] [facility].   Depression screen PHQ 2/9 11/05/2019 08/09/2019 01/31/2019 11/16/2018 04/19/2018  Decreased Interest 0 0 0 0 0  Down, Depressed, Hopeless 0 0 0 0 0  PHQ - 2 Score 0 0 0 0 0   Functional Status Survey: Is the patient deaf or have difficulty hearing?: No Does the patient have difficulty seeing, even when wearing glasses/contacts?: No Does the patient have difficulty concentrating, remembering, or making decisions?: No Does the patient have difficulty walking or climbing stairs?: No Does the patient have difficulty dressing or bathing?: No Does the patient have difficulty doing errands alone such as visiting a doctor's office or shopping?: No  6CIT Screen 02/07/2020  What Year? 0 points  What   month? 0 points  What time? 0 points  Count back from 20 0 points  Months in reverse 0 points  Repeat phrase 0 points  Total Score 0     Hearing Screening   125Hz 250Hz 500Hz 1000Hz 2000Hz 3000Hz 4000Hz 6000Hz 8000Hz  Right ear:           Left ear:             Visual Acuity Screening   Right eye Left eye Both eyes  Without correction:     With  correction: 20/30 20/25 20/20  optho-  Last visit 2 years ago - Dr. Delman Cheadle. Will call to schedule  Dental: every 61month.   Exercise: daily. Walking 1hour, 4.2 miles.     [facility]-C Score  1     Advanced directives: Has a living will and healthcare power of attorney   History Patient Active Problem List   Diagnosis Date Noted  . Murmur 07/18/2017  . Jaundice 05/03/2016  . Elevated LFTs   . Abdominal pain 04/30/2016  . Renal insufficiency 03/05/2013  . HTN (hypertension) 03/06/2012  . Prostate cancer ([facility] 10/04/2011   Past Medical History:  Diagnosis Date  . Hypertension   . Paget disease of bone    Questionable diagnosis.   . Prostate [facility] ([facility]    Past Surgical History:  Procedure Laterality Date  . FINGER GANGLION CYST EXCISION  2011   3rd finger right hand  . HYDROCELE EXCISION  2012   Allergies  Allergen Reactions  . Latex Itching  . Lisinopril Cough  . Neuromuscular Blocking Agents Swelling    Ankle swelling Ankle swelling  . Phenazopyridine Hcl Swelling    Ankle swelling  . Pyridium [Phenazopyridine Hcl] Swelling    Ankle swelling  . Sulfonamide Derivatives Itching   Prior to Admission medications   Medication Sig Start Date End Date Taking? Authorizing Provider  alfuzosin (UROXATRAL) 10 MG 24 hr tablet TAKE ONE TABLET BY MOUTH EVERY MORNING WITH BREAKFAST 01/14/20  Yes GWendie Agreste MD  diltiazem ([facility] 180 MG 24 hr capsule TAKE ONE CAPSULE BY MOUTH EVERY NIGHT AT BEDTIME 12/26/19  Yes GWendie Agreste MD  doxycycline (VIBRA-TABS) 100 MG tablet Take 1 tablet (100 mg total) by mouth daily. For malaria prophylaxis. 11/05/19  Yes GWendie Agreste MD  losartan-hydrochlorothiazide (HYZAAR) 100-25 MG tablet Take 1 tablet by mouth daily. 08/09/19  Yes GWendie Agreste MD   Social History   Socioeconomic History  . Marital status: Married    Spouse name: Not on file  . Number of children: Not  on file  . Years of education: Not on file  . Highest education level: Not on file  Occupational History  . Occupation: professor    EFish farm manager [employer]  Tobacco Use  . Smoking status: Never Smoker  . Smokeless tobacco: Never Used  Substance and Sexual Activity  . Alcohol use: No  . Drug use: No  . Sexual activity: Yes    Partners: Female  Other Topics Concern  . Not on file  Social History Narrative  . Not on file   Social Determinants of Health   Financial Resource Strain:   . Difficulty of Paying Living Expenses:   Food Insecurity:   . Worried About RCharity fundraiserin the Last Year:   . RArboriculturistin the Last Year:   Transportation Needs:   . LFilm/video editor(Medical):   .[name] Kitchen  Lack of Transportation (Non-Medical):   Physical Activity:   . Days of Exercise per Week:   . Minutes of Exercise per Session:   Stress:   . Feeling of Stress :   Social Connections:   . Frequency of Communication with Friends and Family:   . Frequency of Social Gatherings with Friends and Family:   . Attends Religious Services:   . Active Member of Clubs or Organizations:   . Attends Club or Organization Meetings:   . Marital Status:   Intimate Partner Violence:   . Fear of Current or Ex-Partner:   . Emotionally Abused:   . Physically Abused:   . Sexually Abused:     Review of Systems 13 point review of systems per patient health survey noted.  Negative other than as indicated above or in HPI.    Objective:   Vitals:   02/07/20 0814 02/07/20 0831  BP: (!) 145/84 124/80  Pulse: 63   Temp: 97.9 F (36.6 C)   TempSrc: Temporal   SpO2: 98%   Weight: 236 lb (107 kg)   Height: 6' 1" (1.854 m)      Physical Exam Vitals reviewed.  Constitutional:      Appearance: He is well-developed.  HENT:     Head: Normocephalic and atraumatic.     Right Ear: External ear normal.     Left Ear: External ear normal.  Eyes:     Conjunctiva/sclera: Conjunctivae normal.     Pupils:  Pupils are equal, round, and reactive to light.  Neck:     Thyroid: No thyromegaly.  Cardiovascular:     Rate and Rhythm: Normal rate and regular rhythm.     Heart sounds: Normal heart sounds.  Pulmonary:     Effort: Pulmonary effort is normal. No respiratory distress.     Breath sounds: Normal breath sounds. No wheezing.  Abdominal:     General: There is no distension.     Palpations: Abdomen is soft.     Tenderness: There is no abdominal tenderness.  Musculoskeletal:        General: No tenderness. Normal range of motion.     Cervical back: Normal range of motion and neck supple.  Lymphadenopathy:     Cervical: No cervical adenopathy.  Skin:    General: Skin is warm and dry.  Neurological:     Mental Status: He is alert and oriented to person, place, and time.     Deep Tendon Reflexes: Reflexes are normal and symmetric.  Psychiatric:        Behavior: Behavior normal.     Assessment & Plan:  Jeffrey Perry is a 74 y.o. male . Medicare annual wellness visit, subsequent  - - anticipatory guidance as below in AVS, screening labs if needed. Health maintenance items as above in HPI discussed/recommended as applicable.  - no concerning responses on depression, fall, or functional status screening. Any positive responses noted as above. Advanced directives discussed as in CHL.   Elevated alkaline phosphatase level  -Suspected Paget's previously been discussed with rheumatology but not at level for treatment.  Increase in alk phos, will repeat testing, refer to rheumatology.  Possible different units when measured in [place] but will repeat testing today based on reported significant high reading at that time.  Essential hypertension - Plan: Comprehensive metabolic panel  -  Stable, tolerating current regimen. Medications refilled. Labs pending as above.   Prostate cancer (HCC)  -Follow-up with urology as planned, he did request a PSA to   be drawn today.  That result should also be  discussed with urology.  Meds ordered this encounter  Medications  . diltiazem (TIAZAC) 180 MG 24 hr capsule    Sig: Take 1 capsule (180 mg total) by mouth at bedtime.    Dispense:  90 capsule    Refill:  1  . losartan-hydrochlorothiazide (HYZAAR) 100-25 MG tablet    Sig: Take 1 tablet by mouth daily.    Dispense:  90 tablet    Refill:  1   Patient Instructions       Good talking to you today.  I will refer you to rheumatology to discuss the elevated alkaline phosphatase, and potential further testing if needed.  I did repeat that level today.  PSA was also checked, but can be discussed with urology.  Keep follow-up with them as planned.  No change in medications today, recheck 6 months.     Preventive Care 65 Years and Older, Male Preventive care refers to lifestyle choices and visits with your health care provider that can promote health and wellness. This includes:  A yearly physical exam. This is also called an annual well check.  Regular dental and eye exams.  Immunizations.  Screening for certain conditions.  Healthy lifestyle choices, such as diet and exercise. What can I expect for my preventive care visit? Physical exam Your health care provider will check:  Height and weight. These may be used to calculate body mass index (BMI), which is a measurement that tells if you are at a healthy weight.  Heart rate and blood pressure.  Your skin for abnormal spots. Counseling Your health care provider may ask you questions about:  Alcohol, tobacco, and drug use.  Emotional well-being.  Home and relationship well-being.  Sexual activity.  Eating habits.  History of falls.  Memory and ability to understand (cognition).  Work and work environment. What immunizations do I need?  Influenza (flu) vaccine  This is recommended every year. Tetanus, diphtheria, and pertussis (Tdap) vaccine  You may need a Td booster every 10 years. Varicella (chickenpox)  vaccine  You may need this vaccine if you have not already been vaccinated. Zoster (shingles) vaccine  You may need this after age 60. Pneumococcal conjugate (PCV13) vaccine  One dose is recommended after age 65. Pneumococcal polysaccharide (PPSV23) vaccine  One dose is recommended after age 65. Measles, mumps, and rubella (MMR) vaccine  You may need at least one dose of MMR if you were born in 1957 or later. You may also need a second dose. Meningococcal conjugate (MenACWY) vaccine  You may need this if you have certain conditions. Hepatitis A vaccine  You may need this if you have certain conditions or if you travel or work in places where you may be exposed to hepatitis A. Hepatitis B vaccine  You may need this if you have certain conditions or if you travel or work in places where you may be exposed to hepatitis B. Haemophilus influenzae type b (Hib) vaccine  You may need this if you have certain conditions. You may receive vaccines as individual doses or as more than one vaccine together in one shot (combination vaccines). Talk with your health care provider about the risks and benefits of combination vaccines. What tests do I need? Blood tests  Lipid and cholesterol levels. These may be checked every 5 years, or more frequently depending on your overall health.  Hepatitis C test.  Hepatitis B test. Screening  Lung cancer screening. You may   have this screening every year starting at age 53 if you have a 30-pack-year history of smoking and currently smoke or have quit within the past 15 years.  Colorectal cancer screening. All adults should have this screening starting at age 82 and continuing until age 64. Your health care provider may recommend screening at age 82 if you are at increased risk. You will have tests every 1-10 years, depending on your results and the type of screening test.  Prostate cancer screening. Recommendations will vary depending on your family  history and other risks.  Diabetes screening. This is done by checking your blood sugar (glucose) after you have not eaten for a while (fasting). You may have this done every 1-3 years.  Abdominal aortic aneurysm (AAA) screening. You may need this if you are a current or former smoker.  Sexually transmitted disease (STD) testing. Follow these instructions at home: Eating and drinking  Eat a diet that includes fresh fruits and vegetables, whole grains, lean protein, and low-fat dairy products. Limit your intake of foods with high amounts of sugar, saturated fats, and salt.  Take vitamin and mineral supplements as recommended by your health care provider.  Do not drink alcohol if your health care provider tells you not to drink.  If you drink alcohol: ? Limit how much you have to 0-2 drinks a day. ? Be aware of how much alcohol is in your drink. In the [place] one drink equals one 12 oz bottle of beer (355 mL), one 5 oz glass of wine (148 mL), or one 1 oz glass of hard liquor (44 mL). Lifestyle  Take daily care of your teeth and gums.  Stay active. Exercise for at least 30 minutes on 5 or more days each week.  Do not use any products that contain nicotine or tobacco, such as cigarettes, e-cigarettes, and chewing tobacco. If you need help quitting, ask your health care provider.  If you are sexually active, practice safe sex. Use a condom or other form of protection to prevent STIs (sexually transmitted infections).  Talk with your health care provider about taking a low-dose aspirin or statin. What's next?  Visit your health care provider once a year for a well check visit.  Ask your health care provider how often you should have your eyes and teeth checked.  Stay up to date on all vaccines. This information is not intended to replace advice given to you by your health care provider. Make sure you discuss any questions you have with your health care provider. Document Revised:  11/09/2018 Document Reviewed: 11/09/2018 Elsevier Patient Education  [employer].   If you have lab work done today you will be contacted with your lab results within the next 2 weeks.  If you have not heard from [place] then please contact us. The fastest way to get your results is to register for My Chart.   IF you received an x-ray today, you will receive an invoice from [facility]. Please contact [facility] at [phone] with questions or concerns regarding your invoice.   IF you received labwork today, you will receive an invoice from [place]. Please contact [facility] at [phone] with questions or concerns regarding your invoice.   Our billing staff will not be able to assist you with questions regarding bills from these companies.  You will be contacted with the lab results as soon as they are available. The fastest way to get your results is to activate your My Chart account.  Instructions are located on the last page of this paperwork. If you have not heard from us regarding the results in 2 weeks, please contact this office.          Signed, Jeffrey Greene, MD [facility]  

## 2020-02-07 NOTE — Patient Instructions (Signed)
Good talking to you today.  I will refer you to rheumatology to discuss the elevated alkaline phosphatase, and potential further testing if needed.  I did repeat that level today.  PSA was also checked, but can be discussed with urology.  Keep follow-up with them as planned.  No change in medications today, recheck 6 months.     Preventive Care 72 Years and Older, Male Preventive care refers to lifestyle choices and visits with your health care provider that can promote health and wellness. This includes:  A yearly physical exam. This is also called an annual well check.  Regular dental and eye exams.  Immunizations.  Screening for certain conditions.  Healthy lifestyle choices, such as diet and exercise. What can I expect for my preventive care visit? Physical exam Your health care provider will check:  Height and weight. These may be used to calculate body mass index (BMI), which is a measurement that tells if you are at a healthy weight.  Heart rate and blood pressure.  Your skin for abnormal spots. Counseling Your health care provider may ask you questions about:  Alcohol, tobacco, and drug use.  Emotional well-being.  Home and relationship well-being.  Sexual activity.  Eating habits.  History of falls.  Memory and ability to understand (cognition).  Work and work Statistician. What immunizations do I need?  Influenza (flu) vaccine  This is recommended every year. Tetanus, diphtheria, and pertussis (Tdap) vaccine  You may need a Td booster every 10 years. Varicella (chickenpox) vaccine  You may need this vaccine if you have not already been vaccinated. Zoster (shingles) vaccine  You may need this after age 31. Pneumococcal conjugate (PCV13) vaccine  One dose is recommended after age 11. Pneumococcal polysaccharide (PPSV23) vaccine  One dose is recommended after age 66. Measles, mumps, and rubella (MMR) vaccine  You may need at least one dose  of MMR if you were born in 1957 or later. You may also need a second dose. Meningococcal conjugate (MenACWY) vaccine  You may need this if you have certain conditions. Hepatitis A vaccine  You may need this if you have certain conditions or if you travel or work in places where you may be exposed to hepatitis A. Hepatitis B vaccine  You may need this if you have certain conditions or if you travel or work in places where you may be exposed to hepatitis B. Haemophilus influenzae type b (Hib) vaccine  You may need this if you have certain conditions. You may receive vaccines as individual doses or as more than one vaccine together in one shot (combination vaccines). Talk with your health care provider about the risks and benefits of combination vaccines. What tests do I need? Blood tests  Lipid and cholesterol levels. These may be checked every 5 years, or more frequently depending on your overall health.  Hepatitis C test.  Hepatitis B test. Screening  Lung cancer screening. You may have this screening every year starting at age 60 if you have a 30-pack-year history of smoking and currently smoke or have quit within the past 15 years.  Colorectal cancer screening. All adults should have this screening starting at age 42 and continuing until age 41. Your health care provider may recommend screening at age 44 if you are at increased risk. You will have tests every 1-10 years, depending on your results and the type of screening test.  Prostate cancer screening. Recommendations will vary depending on your family history and  other risks.  Diabetes screening. This is done by checking your blood sugar (glucose) after you have not eaten for a while (fasting). You may have this done every 1-3 years.  Abdominal aortic aneurysm (AAA) screening. You may need this if you are a current or former smoker.  Sexually transmitted disease (STD) testing. Follow these instructions at home: Eating and  drinking  Eat a diet that includes fresh fruits and vegetables, whole grains, lean protein, and low-fat dairy products. Limit your intake of foods with high amounts of sugar, saturated fats, and salt.  Take vitamin and mineral supplements as recommended by your health care provider.  Do not drink alcohol if your health care provider tells you not to drink.  If you drink alcohol: ? Limit how much you have to 0-2 drinks a day. ? Be aware of how much alcohol is in your drink. In the [place] one drink equals one 12 oz bottle of beer (355 mL), one 5 oz glass of wine (148 mL), or one 1 oz glass of hard liquor (44 mL). Lifestyle  Take daily care of your teeth and gums.  Stay active. Exercise for at least 30 minutes on 5 or more days each week.  Do not use any products that contain nicotine or tobacco, such as cigarettes, e-cigarettes, and chewing tobacco. If you need help quitting, ask your health care provider.  If you are sexually active, practice safe sex. Use a condom or other form of protection to prevent STIs (sexually transmitted infections).  Talk with your health care provider about taking a low-dose aspirin or statin. What's next?  Visit your health care provider once a year for a well check visit.  Ask your health care provider how often you should have your eyes and teeth checked.  Stay up to date on all vaccines. This information is not intended to replace advice given to you by your health care provider. Make sure you discuss any questions you have with your health care provider. Document Revised: 11/09/2018 Document Reviewed: 11/09/2018 Elsevier Patient Education  [employer].   If you have lab work done today you will be contacted with your lab results within the next 2 weeks.  If you have not heard from [place] then please contact us. The fastest way to get your results is to register for My Chart.   IF you received an x-ray today, you will receive an invoice from  [facility]. Please contact [facility] at [phone] with questions or concerns regarding your invoice.   IF you received labwork today, you will receive an invoice from [place]. Please contact [facility] at [phone] with questions or concerns regarding your invoice.   Our billing staff will not be able to assist you with questions regarding bills from these companies.  You will be contacted with the lab results as soon as they are available. The fastest way to get your results is to activate your My Chart account. Instructions are located on the last page of this paperwork. If you have not heard from [place] regarding the results in 2 weeks, please contact this office.

## 2020-02-08 LAB — COMPREHENSIVE METABOLIC PANEL
ALT: 18 IU/L (ref 0–44)
AST: 29 IU/L (ref 0–40)
Albumin/Globulin Ratio: 1.6 (ref 1.2–2.2)
Albumin: 4.3 g/dL (ref 3.7–4.7)
Alkaline Phosphatase: 361 IU/L — ABNORMAL HIGH (ref 39–117)
BUN/Creatinine Ratio: 13 (ref 10–24)
BUN: 18 mg/dL (ref 8–27)
Bilirubin Total: 0.6 mg/dL (ref 0.0–1.2)
CO2: 22 mmol/L (ref 20–29)
Calcium: 9.3 mg/dL (ref 8.6–10.2)
Chloride: 101 mmol/L (ref 96–106)
Creatinine, Ser: 1.42 mg/dL — ABNORMAL HIGH (ref 0.76–1.27)
GFR calc Af Amer: 57 mL/min/{1.73_m2} — ABNORMAL LOW (ref >59)
GFR calc non Af Amer: 49 mL/min/{1.73_m2} — ABNORMAL LOW (ref >59)
Globulin, Total: 2.7 g/dL (ref 1.5–4.5)
Glucose: 88 mg/dL (ref 65–99)
Potassium: 3.6 mmol/L (ref 3.5–5.2)
Sodium: 137 mmol/L (ref 134–144)
Total Protein: 7 g/dL (ref 6.0–8.5)

## 2020-02-08 LAB — PSA: Prostate Specific Ag, Serum: 0.5 ng/mL (ref 0.0–4.0)

## 2020-02-12 ENCOUNTER — Ambulatory Visit: Payer: Medicare PPO | Attending: Internal Medicine

## 2020-02-12 DIAGNOSIS — Z23 Encounter for immunization: Secondary | ICD-10-CM

## 2020-02-12 NOTE — Progress Notes (Signed)
   Covid-19 Vaccination Clinic  Name:  Jeffrey Perry    MRN: [id number] DOB: Feb 03, 1946  02/12/2020  [name] was observed post Covid-19 immunization for 15 minutes without incident. He was provided with Vaccine Information Sheet and instruction to access the V-Safe system.   [name] was instructed to call 911 with any severe reactions post vaccine: [name] Kitchen Difficulty breathing  . Swelling of face and throat  . A fast heartbeat  . A bad rash all over body  . Dizziness and weakness   Immunizations Administered    Name Date Dose VIS Date Route   Pfizer COVID-19 Vaccine 02/12/2020  2:24 PM 0.3 mL 11/09/2019 Intramuscular   Manufacturer: [place]   Lot: [id number]   [place]: [id number]

## 2020-04-04 NOTE — Progress Notes (Signed)
Office Visit Note  [name]             Date of Birth: September 04, 1946           MRN: [id number]             PCP: [name], MD Referring: [name], MD Visit Date: 04/07/2020 Occupation: _0 @  Subjective:  New Patient (Initial Visit) (Abnormal labs)   History of Present Illness: Truxton Stupka is a 74 y.o. male seen in consultation per request of Dr. Nyoka Cowden.  According to patient he was referred for elevated alkaline phosphatase.  He states he has seen a rheumatologist in the past who was not concerned about the patient Paget's disease.  He was hospitalized in 2016 for elevated liver functions and jaundice.  According to patient no cause was determined.  He does not have any joint pain.  He states after prolonged sitting he may be uncomfortable but no pelvic pain as such as noted.  None of the other joints are painful.  He notices some swelling on his left foot.  Activities of Daily Living:  Patient reports morning stiffness for 0 none.   Patient Denies nocturnal pain.  Difficulty dressing/grooming: Denies Difficulty climbing stairs: Denies Difficulty getting out of chair: Denies Difficulty using hands for taps, buttons, cutlery, and/or writing: Denies  Review of Systems  Constitutional: Negative for fatigue and night sweats.  HENT: Negative for mouth sores, mouth dryness and nose dryness.   Eyes: Negative for redness and dryness.  Respiratory: Negative for shortness of breath and difficulty breathing.   Cardiovascular: Positive for swelling in legs/feet. Negative for chest pain, palpitations, hypertension and irregular heartbeat.  Gastrointestinal: Positive for constipation. Negative for diarrhea.  Endocrine: Positive for heat intolerance. Negative for cold intolerance and increased urination.  Genitourinary: Negative for difficulty urinating.  Musculoskeletal: Negative for arthralgias, joint pain, joint swelling, myalgias, muscle weakness, morning  stiffness, muscle tenderness and myalgias.  Skin: Negative for color change, rash, hair loss, nodules/bumps, skin tightness, ulcers and sensitivity to sunlight.  Allergic/Immunologic: Negative for susceptible to infections.  Neurological: Negative for dizziness, fainting, numbness, memory loss, night sweats and weakness ( ).  Hematological: Negative for bruising/bleeding tendency and swollen glands.  Psychiatric/Behavioral: Positive for sleep disturbance. Negative for depressed mood. The patient is not nervous/anxious.     PMFS History:  Patient Active Problem List   Diagnosis Date Noted  . Murmur 07/18/2017  . Jaundice 05/03/2016  . Elevated LFTs   . Abdominal pain 04/30/2016  . Renal insufficiency 03/05/2013  . HTN (hypertension) 03/06/2012  . Prostate cancer ([place]) 10/04/2011    Past Medical History:  Diagnosis Date  . Hypertension   . Paget disease of bone    Questionable diagnosis.   . Prostate cancer [facility])     Family History  Problem Relation Age of Onset  . Hypertension Sister   . Hypertension Brother   . Hypertension Mother   . Hypertension Brother   . Stroke Sister   . Hypertension Sister   . Colon cancer Neg Hx    Past Surgical History:  Procedure Laterality Date  . BLADDER SURGERY    . FINGER GANGLION CYST EXCISION  2011   3rd finger right hand  . HYDROCELE EXCISION  2012  . PENILE PROSTHESIS IMPLANT     Social History   Social History Narrative  . Not on file   Immunization History  Administered Date(s) Administered  . DTaP 03/22/2008  . Gamma Globulin 06/08/1994  .  H1N1 10/03/2004  . Hepatitis A 03/29/1996, 06/10/1998  . Hepatitis B 03/22/2008, 10/07/2008  . IPV 03/18/2003  . Meningococcal Conjugate 03/22/2008  . PFIZER SARS-COV-2 Vaccination 01/20/2020, 02/12/2020  . Pneumococcal Conjugate-13 05/02/2010, 01/31/2019  . Pneumococcal Polysaccharide-23 03/04/2014  . Pneumococcal-Unspecified 05/02/2010  . Tdap 03/22/2008, 03/24/2018  . Typhoid  Parenteral 05/29/2002  . Yellow Fever 03/22/2008  . Zoster 03/29/2010     Objective: Vital Signs: BP 117/70 (BP Location: Right Arm, Patient Position: Sitting, Cuff Size: Normal)   Pulse 67   Resp 16   Ht _0  (1.854 m)   Wt 240 lb (108.9 kg)   BMI 31.66 kg/m    Physical Exam Vitals and nursing note reviewed.  Constitutional:      Appearance: He is well-developed.  HENT:     Head: Normocephalic and atraumatic.  Eyes:     Conjunctiva/sclera: Conjunctivae normal.     Pupils: Pupils are equal, round, and reactive to light.  Cardiovascular:     Rate and Rhythm: Normal rate and regular rhythm.     Heart sounds: Normal heart sounds.  Pulmonary:     Effort: Pulmonary effort is normal.     Breath sounds: Normal breath sounds.  Abdominal:     General: Bowel sounds are normal.     Palpations: Abdomen is soft.  Musculoskeletal:     Cervical back: Normal range of motion and neck supple.  Skin:    General: Skin is warm and dry.     Capillary Refill: Capillary refill takes less than 2 seconds.  Neurological:     Mental Status: He is alert and oriented to person, place, and time.  Psychiatric:        Behavior: Behavior normal.      Musculoskeletal Exam: C-spine thoracic and lumbar spine with good range of motion. Shoulder joints, elbow joints, wrist joints, MCPs PIPs and DIPs with good range of motion with no synovitis. Hip joints, knee joints, ankles, MTPs and PIPs with good range of motion with no synovitis.  CDAI Exam: CDAI Score: -- Patient Global: --; Provider Global: -- Swollen: --; Tender: -- Joint Exam 04/07/2020   No joint exam has been documented for this visit   There is currently no information documented on the homunculus. Go to the Rheumatology activity and complete the homunculus joint exam.  Investigation: No additional findings.  Imaging: No results found.  Recent Labs: Lab Results  Component Value Date   WBC 5.5 05/11/2016   HGB 12.8 (A)  05/11/2016   PLT 106 (L) 05/03/2016   NA 137 02/07/2020   K 3.6 02/07/2020   CL 101 02/07/2020   CO2 22 02/07/2020   GLUCOSE 88 02/07/2020   BUN 18 02/07/2020   CREATININE 1.42 (H) 02/07/2020   BILITOT 0.6 02/07/2020   ALKPHOS 361 (H) 02/07/2020   AST 29 02/07/2020   ALT 18 02/07/2020   PROT 7.0 02/07/2020   ALBUMIN 4.3 02/07/2020   CALCIUM 9.3 02/07/2020   GFRAA 57 (L) 02/07/2020    Speciality Comments: No specialty comments available.  Procedures:  No procedures performed Allergies: Latex, Lisinopril, Neuromuscular blocking agents, Phenazopyridine hcl, Pyridium [phenazopyridine hcl], and Sulfonamide derivatives   Assessment / Plan:     Visit Diagnoses: Paget disease of bone -patient was previously seen by Dr. Charlestine Night and no treatment was advised. He states he occasionally have discomfort in his pelvic region but usually is not in pain. As his alkaline phosphatase has been elevated he would like to have repeat total body  bone scan before taking treatment for Paget's disease. We will schedule total body bone scan. I discussed possible use of IV zoledronic acid. Indications side effects were discussed. He was in agreement. I will have Amber Yopp of her pharmacist also discussed the side effects with the patient once the total body bone scan is available.  Elevated alkaline phosphatase level - 11/05/19: Alk phos 320 - Plan: VITAMIN D 25 Hydroxy (Vit-D Deficiency, Fractures), Parathyroid hormone, intact (no Ca), Serum protein electrophoresis with reflex  Essential hypertension-under control.  Prostate cancer [facility]) - 2012 ttd  Renal insufficiency-his GFR is in 78s.  Elevated LFTs - 04/2016  Jaundice - 04/2016  Murmur  Medication monitoring encounter - Plan: CBC with Differential/Platelet, COMPLETE METABOLIC PANEL WITH GFR  Orders: Orders Placed This Encounter  Procedures  . NM Bone Scan Whole Body  . CBC with Differential/Platelet  . VITAMIN D 25 Hydroxy (Vit-D Deficiency,  Fractures)  . Parathyroid hormone, intact (no Ca)  . Serum protein electrophoresis with reflex  . COMPLETE METABOLIC PANEL WITH GFR   No orders of the defined types were placed in this encounter.   Face-to-face time spent with patient was 45 minutes. Greater than 50% of time was spent in counseling and coordination of care.  Follow-Up Instructions: Return for Elevated alkaline phosphatase.   Bo Merino, MD  Note - This record has been created using Editor, commissioning.  Chart creation errors have been sought, but may not always  have been located. Such creation errors do not reflect on  the standard of medical care.

## 2020-04-07 ENCOUNTER — Other Ambulatory Visit: Payer: Self-pay

## 2020-04-07 ENCOUNTER — Telehealth: Payer: Self-pay

## 2020-04-07 ENCOUNTER — Encounter: Payer: Self-pay | Admitting: Rheumatology

## 2020-04-07 ENCOUNTER — Ambulatory Visit: Payer: Medicare PPO | Admitting: Rheumatology

## 2020-04-07 VITALS — BP 117/70 | HR 67 | Resp 16 | Ht 73.0 in | Wt 240.0 lb

## 2020-04-07 DIAGNOSIS — N400 Enlarged prostate without lower urinary tract symptoms: Secondary | ICD-10-CM | POA: Diagnosis not present

## 2020-04-07 DIAGNOSIS — C61 Malignant neoplasm of prostate: Secondary | ICD-10-CM

## 2020-04-07 DIAGNOSIS — R17 Unspecified jaundice: Secondary | ICD-10-CM | POA: Diagnosis not present

## 2020-04-07 DIAGNOSIS — R748 Abnormal levels of other serum enzymes: Secondary | ICD-10-CM

## 2020-04-07 DIAGNOSIS — R7989 Other specified abnormal findings of blood chemistry: Secondary | ICD-10-CM | POA: Diagnosis not present

## 2020-04-07 DIAGNOSIS — M889 Osteitis deformans of unspecified bone: Secondary | ICD-10-CM | POA: Diagnosis not present

## 2020-04-07 DIAGNOSIS — N289 Disorder of kidney and ureter, unspecified: Secondary | ICD-10-CM | POA: Diagnosis not present

## 2020-04-07 DIAGNOSIS — Z8546 Personal history of malignant neoplasm of prostate: Secondary | ICD-10-CM | POA: Diagnosis not present

## 2020-04-07 DIAGNOSIS — R011 Cardiac murmur, unspecified: Secondary | ICD-10-CM | POA: Diagnosis not present

## 2020-04-07 DIAGNOSIS — N529 Male erectile dysfunction, unspecified: Secondary | ICD-10-CM | POA: Diagnosis not present

## 2020-04-07 DIAGNOSIS — Z8249 Family history of ischemic heart disease and other diseases of the circulatory system: Secondary | ICD-10-CM | POA: Diagnosis not present

## 2020-04-07 DIAGNOSIS — Z5181 Encounter for therapeutic drug level monitoring: Secondary | ICD-10-CM

## 2020-04-07 DIAGNOSIS — I10 Essential (primary) hypertension: Secondary | ICD-10-CM | POA: Diagnosis not present

## 2020-04-07 DIAGNOSIS — Z6831 Body mass index (BMI) 31.0-31.9, adult: Secondary | ICD-10-CM | POA: Diagnosis not present

## 2020-04-07 DIAGNOSIS — Z882 Allergy status to sulfonamides status: Secondary | ICD-10-CM | POA: Diagnosis not present

## 2020-04-07 DIAGNOSIS — E669 Obesity, unspecified: Secondary | ICD-10-CM | POA: Diagnosis not present

## 2020-04-07 DIAGNOSIS — Z823 Family history of stroke: Secondary | ICD-10-CM | POA: Diagnosis not present

## 2020-04-07 NOTE — Telephone Encounter (Signed)
Dr. Estanislado Pandy ordered a total body bone scan for patient.   Per office note,  I discussed possible use of IV zoledronic acid. Indications side effects were discussed. He was in agreement. I will have Amber Yopp of her pharmacist also discussed the side effects with the patient once the total body bone scan is available.  Thanks!

## 2020-04-07 NOTE — Telephone Encounter (Signed)
Findings of benefits investigation for Reclast :  Insurance: [employer]  Phone: [phone]  Plan is ACTIVE  Per Insurance, [employer]- does NOT require precertification in the outpatient setting as long as the location is considered in-network. [facility] is in-network with this plan. Plan has $0.00 copay for Medicare part B drugs. There is a $35 copay for Specialist visits under this plan. Patient has a max OOP of $3,300, and $0.00 has been met as of today.  RepJoelene Millin Ref# [id number]  11:15 AM Beatriz Chancellor, CPhT

## 2020-04-09 LAB — COMPLETE METABOLIC PANEL WITH GFR
AG Ratio: 1.4 (calc) (ref 1.0–2.5)
ALT: 27 U/L (ref 9–46)
AST: 25 U/L (ref 10–35)
Albumin: 4 g/dL (ref 3.6–5.1)
Alkaline phosphatase (APISO): 275 U/L — ABNORMAL HIGH (ref 35–144)
BUN/Creatinine Ratio: 14 (calc) (ref 6–22)
BUN: 18 mg/dL (ref 7–25)
CO2: 28 mmol/L (ref 20–32)
Calcium: 9.3 mg/dL (ref 8.6–10.3)
Chloride: 104 mmol/L (ref 98–110)
Creat: 1.25 mg/dL — ABNORMAL HIGH (ref 0.70–1.18)
GFR, Est African American: 66 mL/min/{1.73_m2} (ref >=60)
GFR, Est Non African American: 57 mL/min/{1.73_m2} — ABNORMAL LOW (ref >=60)
Globulin: 2.9 g/dL (calc) (ref 1.9–3.7)
Glucose, Bld: 122 mg/dL — ABNORMAL HIGH (ref 65–99)
Potassium: 3.6 mmol/L (ref 3.5–5.3)
Sodium: 139 mmol/L (ref 135–146)
Total Bilirubin: 0.4 mg/dL (ref 0.2–1.2)
Total Protein: 6.9 g/dL (ref 6.1–8.1)

## 2020-04-09 LAB — PROTEIN ELECTROPHORESIS, SERUM, WITH REFLEX
Albumin ELP: 3.8 g/dL (ref 3.8–4.8)
Alpha 1: 0.3 g/dL (ref 0.2–0.3)
Alpha 2: 0.8 g/dL (ref 0.5–0.9)
Beta 2: 0.4 g/dL (ref 0.2–0.5)
Beta Globulin: 0.4 g/dL (ref 0.4–0.6)
Gamma Globulin: 1.2 g/dL (ref 0.8–1.7)
Total Protein: 6.8 g/dL (ref 6.1–8.1)

## 2020-04-09 LAB — CBC WITH DIFFERENTIAL/PLATELET
Absolute Monocytes: 414 cells/uL (ref 200–950)
Basophils Absolute: 22 cells/uL (ref 0–200)
Basophils Relative: 0.5 %
Eosinophils Absolute: 110 cells/uL (ref 15–500)
Eosinophils Relative: 2.5 %
HCT: 42.3 % (ref 38.5–50.0)
Hemoglobin: 12.8 g/dL — ABNORMAL LOW (ref 13.2–17.1)
Lymphs Abs: 1395 cells/uL (ref 850–3900)
MCH: 22 pg — ABNORMAL LOW (ref 27.0–33.0)
MCHC: 30.3 g/dL — ABNORMAL LOW (ref 32.0–36.0)
MCV: 72.7 fL — ABNORMAL LOW (ref 80.0–100.0)
MPV: 10.8 fL (ref 7.5–12.5)
Monocytes Relative: 9.4 %
Neutro Abs: 2460 cells/uL (ref 1500–7800)
Neutrophils Relative %: 55.9 %
Platelets: 201 10*3/uL (ref 140–400)
RBC: 5.82 10*6/uL — ABNORMAL HIGH (ref 4.20–5.80)
RDW: 16.3 % — ABNORMAL HIGH (ref 11.0–15.0)
Total Lymphocyte: 31.7 %
WBC: 4.4 10*3/uL (ref 3.8–10.8)

## 2020-04-09 LAB — PARATHYROID HORMONE, INTACT (NO CA): PTH: 48 pg/mL (ref 14–64)

## 2020-04-10 NOTE — Telephone Encounter (Signed)
Total body bone scan scheduled for 5/24.  We will follow-up pending scan results on starting request.   Mariella Saa, PharmD, [facility], [place] Clinical Specialty Pharmacist [phone]  04/10/2020 9:24 AM

## 2020-04-21 ENCOUNTER — Encounter (HOSPITAL_COMMUNITY): Payer: Self-pay

## 2020-04-21 ENCOUNTER — Ambulatory Visit (HOSPITAL_COMMUNITY): Admission: RE | Admit: 2020-04-21 | Payer: Medicare PPO | Source: Ambulatory Visit

## 2020-04-21 ENCOUNTER — Other Ambulatory Visit: Payer: Self-pay

## 2020-04-21 ENCOUNTER — Ambulatory Visit (HOSPITAL_COMMUNITY)
Admission: RE | Admit: 2020-04-21 | Discharge: 2020-04-21 | Disposition: A | Discharge status: Home / Self Care | Payer: Medicare PPO | Source: Ambulatory Visit | Attending: Rheumatology | Admitting: Rheumatology

## 2020-04-21 ENCOUNTER — Encounter (HOSPITAL_COMMUNITY)
Admission: RE | Admit: 2020-04-21 | Discharge: 2020-04-21 | Disposition: A | Discharge status: Home / Self Care | Payer: Medicare PPO | Source: Ambulatory Visit | Attending: Rheumatology | Admitting: Rheumatology

## 2020-04-21 DIAGNOSIS — M889 Osteitis deformans of unspecified bone: Secondary | ICD-10-CM | POA: Insufficient documentation

## 2020-04-21 DIAGNOSIS — R748 Abnormal levels of other serum enzymes: Secondary | ICD-10-CM | POA: Diagnosis not present

## 2020-04-21 MED ORDER — TECHNETIUM TC 99M MEDRONATE IV KIT
20.0000 | PACK | Freq: Once | INTRAVENOUS | Status: AC | PRN
  Administered 2020-04-21: 18.8 via INTRAVENOUS

## 2020-04-22 NOTE — Progress Notes (Signed)
Total body bone scan is consistent with Paget's disease.  No change was noted according to the radiologist based on comparison.  If patient is ready then we can schedule for IV Reclast.

## 2020-04-23 ENCOUNTER — Telehealth: Payer: Self-pay | Admitting: Pharmacist

## 2020-04-23 DIAGNOSIS — Z5181 Encounter for therapeutic drug level monitoring: Secondary | ICD-10-CM

## 2020-04-23 DIAGNOSIS — M889 Osteitis deformans of unspecified bone: Secondary | ICD-10-CM

## 2020-04-23 NOTE — Telephone Encounter (Signed)
Please start BIV for Reclast for Padget's disease.   Mariella Saa, PharmD, [place], CPP Clinical Specialty Pharmacist (Rheumatology and Pulmonology)  04/23/2020 8:36 AM

## 2020-04-23 NOTE — Telephone Encounter (Signed)
-----   Message from Bo Merino, MD sent at 04/22/2020 12:20 PM EDT ----- Total body bone scan is consistent with Paget's disease.  No change was noted according to the radiologist based on comparison.  If patient is ready then we can schedule for IV Reclast.

## 2020-04-23 NOTE — Telephone Encounter (Signed)
Reclast BIV is in previous encounter.

## 2020-04-24 NOTE — Telephone Encounter (Signed)
Called to notify patient.  Reviewed total body bone scan results.  Patient verbalized understanding.  Advised Dr. Estanislado Pandy wants to admit forward with one-time dose of IV Reclast.  Patient was counseled on proper use, administration, and common side effects.  Discussed results from benefits investigation.  Patient would like to proceed with Reclast.  CBC/CMP was drawn at office visit on 5/10 but not vitamin D.  He will need repeat labs prior to placing orders.  Patient states he will come to the office tomorrow to update labs.  Once labs have resulted and are within normal limits will place order and call patient with number to call in medical day to schedule infusion.   Mariella Saa, PharmD, [place], CPP Clinical Specialty Pharmacist (Rheumatology and Pulmonology)  04/24/2020 9:15 AM

## 2020-04-25 ENCOUNTER — Other Ambulatory Visit: Payer: Self-pay

## 2020-04-25 DIAGNOSIS — Z5181 Encounter for therapeutic drug level monitoring: Secondary | ICD-10-CM

## 2020-04-25 DIAGNOSIS — M889 Osteitis deformans of unspecified bone: Secondary | ICD-10-CM | POA: Diagnosis not present

## 2020-04-26 LAB — CBC WITH DIFFERENTIAL/PLATELET
Absolute Monocytes: 440 cells/uL (ref 200–950)
Basophils Absolute: 22 cells/uL (ref 0–200)
Basophils Relative: 0.5 %
Eosinophils Absolute: 233 cells/uL (ref 15–500)
Eosinophils Relative: 5.3 %
HCT: 41 % (ref 38.5–50.0)
Hemoglobin: 12.6 g/dL — ABNORMAL LOW (ref 13.2–17.1)
Lymphs Abs: 1707 cells/uL (ref 850–3900)
MCH: 22.2 pg — ABNORMAL LOW (ref 27.0–33.0)
MCHC: 30.7 g/dL — ABNORMAL LOW (ref 32.0–36.0)
MCV: 72.3 fL — ABNORMAL LOW (ref 80.0–100.0)
MPV: 10.7 fL (ref 7.5–12.5)
Monocytes Relative: 10 %
Neutro Abs: 1998 cells/uL (ref 1500–7800)
Neutrophils Relative %: 45.4 %
Platelets: 179 10*3/uL (ref 140–400)
RBC: 5.67 10*6/uL (ref 4.20–5.80)
RDW: 15.1 % — ABNORMAL HIGH (ref 11.0–15.0)
Total Lymphocyte: 38.8 %
WBC: 4.4 10*3/uL (ref 3.8–10.8)

## 2020-04-26 LAB — COMPLETE METABOLIC PANEL WITH GFR
AG Ratio: 1.4 (calc) (ref 1.0–2.5)
ALT: 17 U/L (ref 9–46)
AST: 21 U/L (ref 10–35)
Albumin: 4.1 g/dL (ref 3.6–5.1)
Alkaline phosphatase (APISO): 278 U/L — ABNORMAL HIGH (ref 35–144)
BUN/Creatinine Ratio: 13 (calc) (ref 6–22)
BUN: 21 mg/dL (ref 7–25)
CO2: 27 mmol/L (ref 20–32)
Calcium: 9.3 mg/dL (ref 8.6–10.3)
Chloride: 100 mmol/L (ref 98–110)
Creat: 1.63 mg/dL — ABNORMAL HIGH (ref 0.70–1.18)
GFR, Est African American: 48 mL/min/{1.73_m2} — ABNORMAL LOW (ref >=60)
GFR, Est Non African American: 41 mL/min/{1.73_m2} — ABNORMAL LOW (ref >=60)
Globulin: 2.9 g/dL (calc) (ref 1.9–3.7)
Glucose, Bld: 108 mg/dL — ABNORMAL HIGH (ref 65–99)
Potassium: 3.9 mmol/L (ref 3.5–5.3)
Sodium: 137 mmol/L (ref 135–146)
Total Bilirubin: 0.5 mg/dL (ref 0.2–1.2)
Total Protein: 7 g/dL (ref 6.1–8.1)

## 2020-04-26 LAB — VITAMIN D 25 HYDROXY (VIT D DEFICIENCY, FRACTURES): Vit D, 25-Hydroxy: 33 ng/mL (ref 30–100)

## 2020-04-29 ENCOUNTER — Ambulatory Visit: Payer: Medicare PPO | Admitting: Rheumatology

## 2020-04-29 ENCOUNTER — Other Ambulatory Visit: Payer: Self-pay | Admitting: Pharmacist

## 2020-04-29 DIAGNOSIS — M889 Osteitis deformans of unspecified bone: Secondary | ICD-10-CM

## 2020-04-29 NOTE — Addendum Note (Signed)
Addended by: Mariella Saa C on: 04/29/2020 10:21 AM   Modules accepted: Orders

## 2020-04-29 NOTE — Progress Notes (Signed)
Vitamin D and eGFR within normal limits to start Reclast. No pre-auth required through insurance.  Orders placed for 1 time dose of 5 mg for the treatment of padgets disease.  Will need repeat alk phos in 3 months.  Mariella Saa, PharmD, [place], CPP Clinical Specialty Pharmacist (Rheumatology and Pulmonology)  04/29/2020 10:14 AM

## 2020-05-09 ENCOUNTER — Encounter (HOSPITAL_COMMUNITY)
Admission: RE | Admit: 2020-05-09 | Discharge: 2020-05-09 | Disposition: A | Discharge status: Home / Self Care | Payer: Medicare PPO | Source: Ambulatory Visit | Attending: Rheumatology | Admitting: Rheumatology

## 2020-05-09 DIAGNOSIS — M889 Osteitis deformans of unspecified bone: Secondary | ICD-10-CM | POA: Diagnosis not present

## 2020-05-09 MED ORDER — ZOLEDRONIC ACID 5 MG/100ML IV SOLN: 5.0000 mg | Freq: Once | INTRAVENOUS | Status: DC

## 2020-05-09 MED ORDER — ACETAMINOPHEN 325 MG PO TABS
ORAL_TABLET | ORAL | Status: AC
  Administered 2020-05-09: 650 mg
  Filled 2020-05-09: qty 2

## 2020-05-09 MED ORDER — ZOLEDRONIC ACID 5 MG/100ML IV SOLN
INTRAVENOUS | Status: AC
  Administered 2020-05-09: 5 mg
  Filled 2020-05-09: qty 100

## 2020-05-09 MED ORDER — DIPHENHYDRAMINE HCL 25 MG PO CAPS
ORAL_CAPSULE | ORAL | Status: AC
  Administered 2020-05-09: 25 mg
  Filled 2020-05-09: qty 1

## 2020-05-09 MED ORDER — ACETAMINOPHEN 325 MG PO TABS: 650.0000 mg | ORAL_TABLET | Freq: Once | ORAL | Status: DC

## 2020-05-09 MED ORDER — DIPHENHYDRAMINE HCL 25 MG PO CAPS: 25.0000 mg | ORAL_CAPSULE | Freq: Once | ORAL | Status: DC

## 2020-05-19 NOTE — Progress Notes (Signed)
Office Visit Note  [name]             Date of Birth: 13-Jul-1946           MRN: [id number]             PCP: [name], MD Referring: [name], MD Visit Date: 05/27/2020 Occupation: @GUAROCC @  Subjective:  Other (right sided discomfort since reclast infusion )   History of Present Illness: Jeffrey Perry is a 74 y.o. male with history of Paget's disease.  He had Reclast 5 mg IV on 05/09/2020.  He states after the infusion he had severe pain all over his body with chills which lasted almost for 2 weeks.  The symptoms are gradually getting better.  He states now most the pain is resolved except some in the thoracic region.  He states that he used to have a lot of lower back pain and pelvic pain when he is to walk which she did not realize before the treatment.  Now that pain has completely resolved.  He has been walking.  Activities of Daily Living:  Patient reports morning stiffness for 0 minutes.   Patient Denies nocturnal pain.  Difficulty dressing/grooming: Denies Difficulty climbing stairs: Denies Difficulty getting out of chair: Denies Difficulty using hands for taps, buttons, cutlery, and/or writing: Denies  Review of Systems  Constitutional: Negative for fatigue and night sweats.  HENT: Negative for mouth sores, mouth dryness and nose dryness.   Eyes: Negative for redness, itching and dryness.  Respiratory: Negative for shortness of breath and difficulty breathing.   Cardiovascular: Negative for chest pain, palpitations, hypertension, irregular heartbeat and swelling in legs/feet.  Gastrointestinal: Positive for constipation. Negative for blood in stool and diarrhea.  Endocrine: Negative for increased urination.  Genitourinary: Negative for difficulty urinating and painful urination.  Musculoskeletal: Negative for arthralgias, joint pain, joint swelling, myalgias, muscle weakness, morning stiffness, muscle tenderness and myalgias.  Skin: Negative for  color change, rash, hair loss, nodules/bumps, redness, skin tightness, ulcers and sensitivity to sunlight.  Allergic/Immunologic: Negative for susceptible to infections.  Neurological: Negative for dizziness, fainting, numbness, headaches, memory loss, night sweats and weakness.  Hematological: Negative for bruising/bleeding tendency and swollen glands.  Psychiatric/Behavioral: Negative for depressed mood, confusion and sleep disturbance. The patient is not nervous/anxious.     PMFS History:  Patient Active Problem List   Diagnosis Date Noted  . Murmur 07/18/2017  . Jaundice 05/03/2016  . Elevated LFTs   . Abdominal pain 04/30/2016  . Renal insufficiency 03/05/2013  . HTN (hypertension) 03/06/2012  . Prostate cancer ([place]) 10/04/2011    Past Medical History:  Diagnosis Date  . Hypertension   . Paget disease of bone    Questionable diagnosis.   . Prostate cancer [facility])     Family History  Problem Relation Age of Onset  . Hypertension Sister   . Hypertension Brother   . Hypertension Mother   . Hypertension Brother   . Stroke Sister   . Hypertension Sister   . Colon cancer Neg Hx    Past Surgical History:  Procedure Laterality Date  . BLADDER SURGERY    . FINGER GANGLION CYST EXCISION  2011   3rd finger right hand  . HYDROCELE EXCISION  2012  . PENILE PROSTHESIS IMPLANT     Social History   Social History Narrative  . Not on file   Immunization History  Administered Date(s) Administered  . DTaP 03/22/2008  . Gamma Globulin 06/08/1994  .  H1N1 10/03/2004  . Hepatitis A 03/29/1996, 06/10/1998  . Hepatitis B 03/22/2008, 10/07/2008  . IPV 03/18/2003  . Meningococcal Conjugate 03/22/2008  . PFIZER SARS-COV-2 Vaccination 01/20/2020, 02/12/2020  . Pneumococcal Conjugate-13 05/02/2010, 01/31/2019  . Pneumococcal Polysaccharide-23 03/04/2014  . Pneumococcal-Unspecified 05/02/2010  . Tdap 03/22/2008, 03/24/2018  . Typhoid Parenteral 05/29/2002  . Yellow Fever 03/22/2008   . Zoster 03/29/2010     Objective: Vital Signs: BP 128/76 (BP Location: Left Arm, Patient Position: Sitting, Cuff Size: Normal)   Pulse 61   Resp 16   Ht 6' 1"  (1.854 m)   Wt 241 lb 9.6 oz (109.6 kg)   BMI 31.88 kg/m    Physical Exam Vitals and nursing note reviewed.  Constitutional:      Appearance: He is well-developed.  HENT:     Head: Normocephalic and atraumatic.  Eyes:     Conjunctiva/sclera: Conjunctivae normal.     Pupils: Pupils are equal, round, and reactive to light.  Cardiovascular:     Rate and Rhythm: Normal rate and regular rhythm.     Heart sounds: Normal heart sounds.  Pulmonary:     Effort: Pulmonary effort is normal.     Breath sounds: Normal breath sounds.  Abdominal:     General: Bowel sounds are normal.     Palpations: Abdomen is soft.  Musculoskeletal:     Cervical back: Normal range of motion and neck supple.  Skin:    General: Skin is warm and dry.     Capillary Refill: Capillary refill takes less than 2 seconds.  Neurological:     Mental Status: He is alert and oriented to person, place, and time.  Psychiatric:        Behavior: Behavior normal.      Musculoskeletal Exam: C-spine thoracic and lumbar spine with good range of motion.  Shoulder joints, elbow joints, wrist joints, MCPs PIPs and DIPs with good range of motion with no synovitis.  Hip joints, knee joints, ankles with good range of motion with no tenderness.  CDAI Exam: CDAI Score: -- Patient Global: --; Provider Global: -- Swollen: --; Tender: -- Joint Exam 05/27/2020   No joint exam has been documented for this visit   There is currently no information documented on the homunculus. Go to the Rheumatology activity and complete the homunculus joint exam.  Investigation: No additional findings.  Imaging: No results found.  Recent Labs: Lab Results  Component Value Date   WBC 4.4 04/25/2020   HGB 12.6 (L) 04/25/2020   PLT 179 04/25/2020   NA 137 04/25/2020   K 3.9  04/25/2020   CL 100 04/25/2020   CO2 27 04/25/2020   GLUCOSE 108 (H) 04/25/2020   BUN 21 04/25/2020   CREATININE 1.63 (H) 04/25/2020   BILITOT 0.5 04/25/2020   ALKPHOS 361 (H) 02/07/2020   AST 21 04/25/2020   ALT 17 04/25/2020   PROT 7.0 04/25/2020   ALBUMIN 4.3 02/07/2020   CALCIUM 9.3 04/25/2020   GFRAA 48 (L) 04/25/2020  Apr 07, 2020 SPEP normal, PTH normal, vitamin D 33  Speciality Comments: No specialty comments available.  Procedures:  No procedures performed Allergies: Latex, Lisinopril, Neuromuscular blocking agents, Phenazopyridine hcl, Pyridium [phenazopyridine hcl], and Sulfonamide derivatives   Assessment / Plan:     Visit Diagnoses: Paget disease of bone -involving pelvis and sacrum.  Patient received Reclast 5 mg IV on May 09, 2020.  He had good response to the Reclast.  He states the pelvic pain has resolved.  Although  he experienced some generalized bony pain and chills after the Reclast infusion.  Plan repeat alk phos in 3 months.  And then in December.  Medication monitoring encounter - Plan: COMPLETE METABOLIC PANEL WITH GFR in September and December.  Elevated alkaline phosphatase level-we will monitor levels.  Essential hypertension-is controlled.  Prostate cancer ([place])  Renal insufficiency - GFR 48  Orders: Orders Placed This Encounter  Procedures  . COMPLETE METABOLIC PANEL WITH GFR   No orders of the defined types were placed in this encounter.     Follow-Up Instructions: Return for Pagets diease.   Bo Merino, MD  Note - This record has been created using Editor, commissioning.  Chart creation errors have been sought, but may not always  have been located. Such creation errors do not reflect on  the standard of medical care.

## 2020-05-27 ENCOUNTER — Encounter: Payer: Self-pay | Admitting: Rheumatology

## 2020-05-27 ENCOUNTER — Other Ambulatory Visit: Payer: Self-pay

## 2020-05-27 ENCOUNTER — Ambulatory Visit: Payer: Medicare PPO | Admitting: Rheumatology

## 2020-05-27 VITALS — BP 128/76 | HR 61 | Resp 16 | Ht 73.0 in | Wt 241.6 lb

## 2020-05-27 DIAGNOSIS — N289 Disorder of kidney and ureter, unspecified: Secondary | ICD-10-CM | POA: Diagnosis not present

## 2020-05-27 DIAGNOSIS — I10 Essential (primary) hypertension: Secondary | ICD-10-CM

## 2020-05-27 DIAGNOSIS — Z5181 Encounter for therapeutic drug level monitoring: Secondary | ICD-10-CM | POA: Diagnosis not present

## 2020-05-27 DIAGNOSIS — C61 Malignant neoplasm of prostate: Secondary | ICD-10-CM | POA: Diagnosis not present

## 2020-05-27 DIAGNOSIS — R748 Abnormal levels of other serum enzymes: Secondary | ICD-10-CM

## 2020-05-27 DIAGNOSIS — M889 Osteitis deformans of unspecified bone: Secondary | ICD-10-CM

## 2020-05-27 NOTE — Patient Instructions (Addendum)
Please return in September and December for your lab work ([place])

## 2020-08-01 ENCOUNTER — Other Ambulatory Visit: Payer: Self-pay | Admitting: *Deleted

## 2020-08-01 DIAGNOSIS — Z5181 Encounter for therapeutic drug level monitoring: Secondary | ICD-10-CM | POA: Diagnosis not present

## 2020-08-01 DIAGNOSIS — M889 Osteitis deformans of unspecified bone: Secondary | ICD-10-CM

## 2020-08-02 LAB — COMPLETE METABOLIC PANEL WITH GFR
AG Ratio: 1.4 (calc) (ref 1.0–2.5)
ALT: 13 U/L (ref 9–46)
AST: 20 U/L (ref 10–35)
Albumin: 4.1 g/dL (ref 3.6–5.1)
Alkaline phosphatase (APISO): 93 U/L (ref 35–144)
BUN/Creatinine Ratio: 12 (calc) (ref 6–22)
BUN: 17 mg/dL (ref 7–25)
CO2: 26 mmol/L (ref 20–32)
Calcium: 8.5 mg/dL — ABNORMAL LOW (ref 8.6–10.3)
Chloride: 103 mmol/L (ref 98–110)
Creat: 1.44 mg/dL — ABNORMAL HIGH (ref 0.70–1.18)
GFR, Est African American: 56 mL/min/{1.73_m2} — ABNORMAL LOW (ref >=60)
GFR, Est Non African American: 48 mL/min/{1.73_m2} — ABNORMAL LOW (ref >=60)
Globulin: 2.9 g/dL (calc) (ref 1.9–3.7)
Glucose, Bld: 64 mg/dL — ABNORMAL LOW (ref 65–139)
Potassium: 4.1 mmol/L (ref 3.5–5.3)
Sodium: 137 mmol/L (ref 135–146)
Total Bilirubin: 0.5 mg/dL (ref 0.2–1.2)
Total Protein: 7 g/dL (ref 6.1–8.1)

## 2020-08-05 NOTE — Progress Notes (Signed)
GFR is low and is stable.  Alkaline phosphatase is normal now.  We will repeat CMP in 3 months.  Calcium is low.  Please advise intake of food rich in calcium.

## 2020-08-08 ENCOUNTER — Ambulatory Visit: Payer: Medicare PPO | Admitting: Family Medicine

## 2020-08-08 ENCOUNTER — Other Ambulatory Visit: Payer: Self-pay

## 2020-08-08 ENCOUNTER — Encounter: Payer: Self-pay | Admitting: Family Medicine

## 2020-08-08 VITALS — BP 126/78 | HR 63 | Temp 97.8°F | Ht 73.0 in | Wt 238.0 lb

## 2020-08-08 DIAGNOSIS — I10 Essential (primary) hypertension: Secondary | ICD-10-CM | POA: Diagnosis not present

## 2020-08-08 DIAGNOSIS — N1831 Chronic kidney disease, stage 3a: Secondary | ICD-10-CM

## 2020-08-08 DIAGNOSIS — M889 Osteitis deformans of unspecified bone: Secondary | ICD-10-CM

## 2020-08-08 MED ORDER — ALFUZOSIN HCL ER 10 MG PO TB24: ORAL_TABLET | ORAL | 1 refills | Status: DC

## 2020-08-08 MED ORDER — DILTIAZEM HCL ER BEADS 180 MG PO CP24: 180.0000 mg | ORAL_CAPSULE | Freq: Every day | ORAL | 1 refills | Status: DC

## 2020-08-08 MED ORDER — LOSARTAN POTASSIUM-HCTZ 100-25 MG PO TABS: 1.0000 | ORAL_TABLET | Freq: Every day | ORAL | 1 refills | Status: DC

## 2020-08-08 NOTE — Patient Instructions (Addendum)
  Heart rate ok here today. You can call and schedule appointment with Dr. Percival Spanish to discuss those elevated and low heart rates.  No med changes today.  Thanks for coming in today.   If you have lab work done today you will be contacted with your lab results within the next 2 weeks.  If you have not heard from [place] then please contact us. The fastest way to get your results is to register for My Chart.   IF you received an x-ray today, you will receive an invoice from [facility]. Please contact [facility] at [phone] with questions or concerns regarding your invoice.   IF you received labwork today, you will receive an invoice from [facility]. Please contact [facility] at [phone] with questions or concerns regarding your invoice.   Our billing staff will not be able to assist you with questions regarding bills from these companies.  You will be contacted with the lab results as soon as they are available. The fastest way to get your results is to activate your My Chart account. Instructions are located on the last page of this paperwork. If you have not heard from [place] regarding the results in 2 weeks, please contact this office.

## 2020-08-08 NOTE — Progress Notes (Signed)
Subjective:  Patient ID: Jeffrey Perry, male    DOB: Apr 03, 1946  Age: 74 y.o. MRN: [id number]  CC:  Chief Complaint  Patient presents with  . Follow-up    hypertension. pt reports no issues with BP since last OV. Pt reports no physical symptoms since last OV. Pt reports having and infussion done for his alkaline phosphate levels.  . heart rate questions    Pt's apple watche alerted the pt that his heart rate was elevated during the afternoon pt  reports not being active during the alert, and then also alerted him that his heart rate was low while sleeping PT reports no chest pain     HPI [name] presents for   Hypertension: With CKD 3a Diltiazem 180 mg daily, losartan HCTZ 100/25 mg daily.  He also takes alfuzosin 10 mg daily as well with history of prostate cancer, BPH withLUTS.  Apple watch has indicated elevated readings at times 59-160, then range of 103-143, no palpitations, did not feel fast heart rate. Asymptomatic, low readings when he was sleeping. He has a history of sinus bradycardia with rate of 53 on EKG September 2020. Cardiology - Dr. Percival Spanish.  No new side effects with meds. Home readings:none recent.  BP Readings from Last 3 Encounters:  08/08/20 126/78  05/27/20 128/76  05/09/20 (!) 142/78  egfr 56 on recent labs.  Lab Results  Component Value Date   CREATININE 1.44 (H) 08/01/2020  prior creat 1.63 in May.  No nsaids.   Paget's disease: Under care of rheumatology, Dr. Estanislado Pandy.  Status post treatment with Reclast infusion, with normalization of alk phos at 93 on September 3. Felt bone soreness day after injection. [place] now.   History Patient Active Problem List   Diagnosis Date Noted  . Murmur 07/18/2017  . Jaundice 05/03/2016  . Elevated LFTs   . Abdominal pain 04/30/2016  . Renal insufficiency 03/05/2013  . HTN (hypertension) 03/06/2012  . Prostate cancer ([place]) 10/04/2011   Past Medical History:  Diagnosis Date  . Hypertension   . Paget disease of  bone    Questionable diagnosis.   . Prostate cancer [facility])    Past Surgical History:  Procedure Laterality Date  . BLADDER SURGERY    . FINGER GANGLION CYST EXCISION  2011   3rd finger right hand  . HYDROCELE EXCISION  2012  . PENILE PROSTHESIS IMPLANT     Allergies  Allergen Reactions  . Latex Itching  . Lisinopril Cough  . Neuromuscular Blocking Agents Swelling    Ankle swelling Ankle swelling  . Phenazopyridine Hcl Swelling    Ankle swelling  . Pyridium [Phenazopyridine Hcl] Swelling    Ankle swelling  . Sulfonamide Derivatives Itching   Prior to Admission medications   Medication Sig Start Date End Date Taking? Authorizing Provider  alfuzosin (UROXATRAL) 10 MG 24 hr tablet TAKE ONE TABLET BY MOUTH EVERY MORNING WITH BREAKFAST 01/14/20  Yes [name], MD  diltiazem [facility]) 180 MG 24 hr capsule Take 1 capsule (180 mg total) by mouth at bedtime. 02/07/20  Yes [name], MD  losartan-hydrochlorothiazide (HYZAAR) 100-25 MG tablet Take 1 tablet by mouth daily. 02/07/20  Yes [name], MD   Social History   Socioeconomic History  . Marital status: Married    Spouse name: Not on file  . Number of children: Not on file  . Years of education: Not on file  . Highest education level: Not on file  Occupational History  . Occupation:  professor    Employer: [employer]  Tobacco Use  . Smoking status: Never Smoker  . Smokeless tobacco: Never Used  Vaping Use  . Vaping Use: Never used  Substance and Sexual Activity  . Alcohol use: Yes    Comment: Beer occ  . Drug use: No  . Sexual activity: Yes    Partners: Female  Other Topics Concern  . Not on file  Social History Narrative  . Not on file   Social Determinants of Health   Financial Resource Strain:   . Difficulty of Paying Living Expenses: Not on file  Food Insecurity:   . Worried About Charity fundraiser in the Last Year: Not on file  . Ran Out of Food in the Last Year: Not on file  Transportation  Needs:   . Lack of Transportation (Medical): Not on file  . Lack of Transportation (Non-Medical): Not on file  Physical Activity:   . Days of Exercise per Week: Not on file  . Minutes of Exercise per Session: Not on file  Stress:   . Feeling of Stress : Not on file  Social Connections:   . Frequency of Communication with Friends and Family: Not on file  . Frequency of Social Gatherings with Friends and Family: Not on file  . Attends Religious Services: Not on file  . Active Member of Clubs or Organizations: Not on file  . Attends Archivist Meetings: Not on file  . Marital Status: Not on file  Intimate Partner Violence:   . Fear of Current or Ex-Partner: Not on file  . Emotionally Abused: Not on file  . Physically Abused: Not on file  . Sexually Abused: Not on file    Review of Systems  Constitutional: Negative for fatigue and unexpected weight change.  Eyes: Negative for visual disturbance.  Respiratory: Negative for cough, chest tightness and shortness of breath.   Cardiovascular: Negative for chest pain, palpitations and leg swelling.  Gastrointestinal: Negative for abdominal pain and blood in stool.  Neurological: Negative for dizziness, light-headedness and headaches.     Objective:   Vitals:   08/08/20 0810  BP: 126/78  Pulse: 63  Temp: 97.8 F (36.6 C)  TempSrc: Temporal  SpO2: 98%  Weight: 238 lb (108 kg)  Height: 6' 1"  (1.854 m)     Physical Exam Vitals reviewed.  Constitutional:      Appearance: He is well-developed.  HENT:     Head: Normocephalic and atraumatic.  Eyes:     Pupils: Pupils are equal, round, and reactive to light.  Neck:     Vascular: No carotid bruit or JVD.  Cardiovascular:     Rate and Rhythm: Normal rate and regular rhythm.     Heart sounds: Murmur (1-2/6 ) heard.      Comments: No ectopy, regular rate.  Pulmonary:     Effort: Pulmonary effort is normal.     Breath sounds: Normal breath sounds. No rales.  Skin:     General: Skin is warm and dry.  Neurological:     Mental Status: He is alert and oriented to person, place, and time.        Assessment & Plan:  Jeffrey Perry is a 74 y.o. male . Essential hypertension  -Stable control with current regimen.  No changes.  In regards to heart rate noted on apple watch, he was asymptomatic, regular rhythm in the office today.  He does plan to meet with his cardiologist to decide if further  testing indicated.  RTC/ER precautions if symptomatic with variable heart rate.  Paget's bone disease  -Status post Reclast.  Most recent labs reviewed including normal alk phos.  Continue follow-up with rheumatology.  Stage 3a chronic kidney disease  -Improved creatinine on recent testing.  Avoiding NSAIDs.  No orders of the defined types were placed in this encounter.  Patient Instructions    Heart rate ok here today. You can call and schedule appointment with Dr. Percival Spanish to discuss those elevated and low heart rates.  No med changes today.  Thanks for coming in today.   If you have lab work done today you will be contacted with your lab results within the next 2 weeks.  If you have not heard from [place] then please contact us. The fastest way to get your results is to register for My Chart.   IF you received an x-ray today, you will receive an invoice from [facility]. Please contact [facility] at [phone] with questions or concerns regarding your invoice.   IF you received labwork today, you will receive an invoice from [place]. Please contact [facility] at [phone] with questions or concerns regarding your invoice.   Our billing staff will not be able to assist you with questions regarding bills from these companies.  You will be contacted with the lab results as soon as they are available. The fastest way to get your results is to activate your My Chart account. Instructions are located on the last page of this paperwork. If you have  not heard from [place] regarding the results in 2 weeks, please contact this office.         Signed, [name], MD [facility]

## 2020-09-10 DIAGNOSIS — Z8546 Personal history of malignant neoplasm of prostate: Secondary | ICD-10-CM | POA: Diagnosis not present

## 2020-09-15 ENCOUNTER — Telehealth: Payer: Self-pay | Admitting: Cardiology

## 2020-09-15 DIAGNOSIS — N529 Male erectile dysfunction, unspecified: Secondary | ICD-10-CM | POA: Diagnosis not present

## 2020-09-15 DIAGNOSIS — R351 Nocturia: Secondary | ICD-10-CM | POA: Diagnosis not present

## 2020-09-15 DIAGNOSIS — Z8546 Personal history of malignant neoplasm of prostate: Secondary | ICD-10-CM | POA: Diagnosis not present

## 2020-09-15 NOTE — Telephone Encounter (Signed)
Office visit with Dr Percival Spanish 10/29, will forward for review

## 2020-09-15 NOTE — Telephone Encounter (Signed)
Patient sent a MyChart message with the following information Hi:  According to my Apple watch my heart rhythm is irregular and has been so since 4:15 am today.  I took my pressure three time about 30 minutes ago (with the last reading being 138/82) and the machine also alerted me that my heart rhythm is irregular. This is concerning so I would appreciate your advice on what to do.   [place]   STAT if HR is under 50 or over 120 (normal HR is 60-100 beats per minute)  1) What is your heart rate? 64  2) Do you have a log of your heart rate readings (document readings)?   3) Do you have any other symptoms? No. Symptoms stopped at 5 pm yesterday   Called patient to follow-up from [place] message. Patient said symptoms resolved. Advised patient to call us if symptoms come back instead of using MyChart. Added patient to a wait list to try and be seen sooner

## 2020-09-15 NOTE — Telephone Encounter (Signed)
Can he send any of the rhythm strips from home?

## 2020-09-15 NOTE — Telephone Encounter (Signed)
Spoke with patient, he will try to upload

## 2020-09-25 DIAGNOSIS — I071 Rheumatic tricuspid insufficiency: Secondary | ICD-10-CM | POA: Insufficient documentation

## 2020-09-25 DIAGNOSIS — R002 Palpitations: Secondary | ICD-10-CM | POA: Insufficient documentation

## 2020-09-25 DIAGNOSIS — N182 Chronic kidney disease, stage 2 (mild): Secondary | ICD-10-CM | POA: Insufficient documentation

## 2020-09-25 NOTE — Progress Notes (Addendum)
Cardiology Office Note   Date:  09/26/2020   ID:  Waller, Marcussen 31-Jul-1946, MRN [id number]  PCP:  [name], MD  Cardiologist:   Minus Breeding, MD   Chief Complaint  Patient presents with  . Palpitations      History of Present Illness: Jeffrey Perry is a 74 y.o. male who presents for evaluation of HTN and a heart murmur.   He was found to have TR.   Since I last saw him he called to say that his Apple Watch that demonstrated an irregular rhythm but I do not believe he was able to send [place] any of the strips.  He presented today to discuss this.  His watch would alarm and say that he might be in A. fib but it is an Apple Watch 3 and cannot record of EKG it turns out.  I reviewed it and there is some reports of irregularity.  However, I also see that he has PACs on his EKG which likely is what this is recording.  He has had a couple of episodes of it fluttering.  This was in July and September.  I see some mention of some low heart rates but I do not see any tachy arrhythmia rapid heart rates recorded.  He has had any presyncope or syncope.  He has some sporadic mild chest discomfort.  However, he is walking 4 to 5 miles a day without any shortness of breath, PND or orthopnea.  This does not cause him to have any chest pressure, neck or arm discomfort.  He has no weight gain and has only trace lower extremity swelling.   Past Medical History:  Diagnosis Date  . Hypertension   . Paget disease of bone    Questionable diagnosis.   . Prostate cancer [facility])     Past Surgical History:  Procedure Laterality Date  . BLADDER SURGERY    . FINGER GANGLION CYST EXCISION  2011   3rd finger right hand  . HYDROCELE EXCISION  2012  . PENILE PROSTHESIS IMPLANT       Current Outpatient Medications  Medication Sig Dispense Refill  . alfuzosin (UROXATRAL) 10 MG 24 hr tablet TAKE ONE TABLET BY MOUTH EVERY MORNING WITH BREAKFAST 90 tablet 1  . diltiazem (TIAZAC) 180 MG 24 hr capsule Take  1 capsule (180 mg total) by mouth at bedtime. 90 capsule 1  . losartan-hydrochlorothiazide (HYZAAR) 100-25 MG tablet Take 1 tablet by mouth daily. 90 tablet 1   No current facility-administered medications for this visit.    Allergies:   Latex, Lisinopril, Neuromuscular blocking agents, Phenazopyridine hcl, Pyridium [phenazopyridine hcl], and Sulfonamide derivatives    ROS:  Please see the history of present illness.   Otherwise, review of systems are positive for none.   All other systems are reviewed and negative.    PHYSICAL EXAM: VS:  BP 119/68   Pulse (!) 53   Ht 6\' 1"  (1.854 m)   Wt 237 lb (107.5 kg)   SpO2 98%   BMI 31.27 kg/m  , BMI Body mass index is 31.27 kg/m.  GENERAL:  Well appearing NECK:  No jugular venous distention, waveform within normal limits, carotid upstroke brisk and symmetric, no bruits, no thyromegaly LUNGS:  Clear to auscultation bilaterally CHEST:  Unremarkable HEART:  PMI not displaced or sustained,S1 and S2 within normal limits, no S3, no S4, no clicks, no rubs, soft 2 out of 6 left mid sternal border systolic murmur, no diastolic murmurs  ABD:  Flat, positive bowel sounds normal in frequency in pitch, no bruits, no rebound, no guarding, no midline pulsatile mass, no hepatomegaly, no splenomegaly EXT:  2 plus pulses throughout, no edema, no cyanosis no clubbing    EKG:  EKG is  ordered today. Sinus rhythm, rate 53, left anterior fascicular block, left ventricular hypertrophy, no acute T-wave changes.  No change from previous   Recent Labs: 04/25/2020: Hemoglobin 12.6; Platelets 179 08/01/2020: ALT 13; BUN 17; Creat 1.44; Potassium 4.1; Sodium 137    Lipid Panel    Component Value Date/Time   CHOL 151 02/21/2017 1131   TRIG 129 02/21/2017 1131   HDL 34 (L) 02/21/2017 1131   CHOLHDL 4.4 02/21/2017 1131   CHOLHDL 3.8 07/22/2016 0842   VLDL 16 07/22/2016 0842   LDLCALC 91 02/21/2017 1131      Wt Readings from Last 3 Encounters:  09/26/20 237  lb (107.5 kg)  08/08/20 238 lb (108 kg)  05/27/20 241 lb 9.6 oz (109.6 kg)      Other studies Reviewed: Additional studies/ records that were reviewed today include: Extensive review of his diarrhea and his phone Review of the above records demonstrates:  See elsewhere   ASSESSMENT AND PLAN:  TR:    His exam is unchanged.  I do not strongly suspect worsening of this.  I will follow this clinically.  No further imaging at this time.   ARRHYTHMIA: He likely is having some premature atrial contractions as he demonstrates today.  These are not particularly symptomatic.  I suggested maybe getting an Apple 4 so he could record EKGs.  If he has more dysrhythmia symptoms going forward I would apply a monitor but at this point I think it is too infrequent to be of use.  HTN:  BP is is actually excellent.  However, he gets a little lightheaded in the morning.  He does have some bradycardia.  I told him if he has increasing dizziness or lightheadedness I would back off probably on his Cardizem and possibly on his other antihypertensive.  For now he is tolerating this and he will let me know.    CKD II:   Creat is 1.44.  This is stable.  No change in therapy.   OVERWEIGHT: We talked about dietary changes to his accompanying he has excellent exercise.  Current medicines are reviewed at length with the patient today.  The patient does not have concerns regarding medicines.  The following changes have been made:   None  Labs/ tests ordered today include: None  Orders Placed This Encounter  Procedures  . EKG 12-Lead     Disposition:   FU with APP in 6 months.   Signed, Minus Breeding, MD  09/26/2020 9:52 AM    [facility]

## 2020-09-26 ENCOUNTER — Encounter: Payer: Self-pay | Admitting: Cardiology

## 2020-09-26 ENCOUNTER — Other Ambulatory Visit: Payer: Self-pay

## 2020-09-26 ENCOUNTER — Ambulatory Visit: Payer: Medicare PPO | Admitting: Cardiology

## 2020-09-26 VITALS — BP 119/68 | HR 53 | Ht 73.0 in | Wt 237.0 lb

## 2020-09-26 DIAGNOSIS — I10 Essential (primary) hypertension: Secondary | ICD-10-CM

## 2020-09-26 DIAGNOSIS — N182 Chronic kidney disease, stage 2 (mild): Secondary | ICD-10-CM

## 2020-09-26 DIAGNOSIS — I071 Rheumatic tricuspid insufficiency: Secondary | ICD-10-CM | POA: Diagnosis not present

## 2020-09-26 DIAGNOSIS — R002 Palpitations: Secondary | ICD-10-CM | POA: Diagnosis not present

## 2020-09-26 NOTE — Patient Instructions (Signed)
Medication Instructions:  No changes *If you need a refill on your cardiac medications before your next appointment, please call your pharmacy*   Lab Work: None ordered If you have labs (blood work) drawn today and your tests are completely normal, you will receive your results only by: [name] Kitchen MyChart Message (if you have MyChart) OR . A paper copy in the mail If you have any lab test that is abnormal or we need to change your treatment, we will call you to review the results.   Testing/Procedures: None ordered   Follow-Up: At [facility], you and your health needs are our priority.  As part of our continuing mission to provide you with exceptional heart care, we have created designated Provider Care Teams.  These Care Teams include your primary Cardiologist (physician) and Advanced Practice Providers (APPs -  Physician Assistants and Nurse Practitioners) who all work together to provide you with the care you need, when you need it.  We recommend signing up for the patient portal called "MyChart".  Sign up information is provided on this After Visit Summary.  MyChart is used to connect with patients for Virtual Visits (Telemedicine).  Patients are able to view lab/test results, encounter notes, upcoming appointments, etc.  Non-urgent messages can be sent to your provider as well.   To learn more about what you can do with MyChart, go to [email or web address].    Your next appointment:   6 month(s)  The format for your next appointment:   In Person  Provider:   You will see one of the following Advanced Practice Providers on your designated Care Team:    Rosaria Ferries, PA-C  Jory Sims, DNP, ANP    Other Instructions None

## 2020-11-12 DIAGNOSIS — E559 Vitamin D deficiency, unspecified: Secondary | ICD-10-CM | POA: Diagnosis not present

## 2020-11-12 DIAGNOSIS — R748 Abnormal levels of other serum enzymes: Secondary | ICD-10-CM | POA: Diagnosis not present

## 2020-11-12 DIAGNOSIS — N183 Chronic kidney disease, stage 3 unspecified: Secondary | ICD-10-CM | POA: Diagnosis not present

## 2020-11-12 DIAGNOSIS — C61 Malignant neoplasm of prostate: Secondary | ICD-10-CM | POA: Diagnosis not present

## 2020-11-12 DIAGNOSIS — I129 Hypertensive chronic kidney disease with stage 1 through stage 4 chronic kidney disease, or unspecified chronic kidney disease: Secondary | ICD-10-CM | POA: Diagnosis not present

## 2020-11-12 DIAGNOSIS — N3040 Irradiation cystitis without hematuria: Secondary | ICD-10-CM | POA: Diagnosis not present

## 2020-11-18 ENCOUNTER — Other Ambulatory Visit: Payer: Self-pay

## 2020-11-18 DIAGNOSIS — Z5181 Encounter for therapeutic drug level monitoring: Secondary | ICD-10-CM | POA: Diagnosis not present

## 2020-11-18 NOTE — Progress Notes (Signed)
Office Visit Note  [name]             Date of Birth: 1946-10-13           MRN: [id number]             PCP: [name], MD Referring: [name], MD Visit Date: 12/02/2020 Occupation: @GUAROCC @  Subjective:  Paget's disease follow-up.   History of Present Illness: Tin Stankus is a 74 y.o. male with Paget's disease of pelvis and sacrum.  He received Reclast infusion on May 09, 2020.  He had good response to the treatment.  He states the pain has completely resolved.  He denies discomfort in any of his joints.  He has been walking about 4 to 5 miles daily.  He states towards the end of the day he notices some fluid retention.  Activities of Daily Living:  Patient reports morning stiffness for 0 minutes.   Patient Denies nocturnal pain.  Difficulty dressing/grooming: Denies Difficulty climbing stairs: Denies Difficulty getting out of chair: Denies Difficulty using hands for taps, buttons, cutlery, and/or writing: Denies  Review of Systems  Constitutional: Negative for fatigue.  HENT: Negative for mouth sores, mouth dryness and nose dryness.   Eyes: Positive for itching. Negative for pain, visual disturbance and dryness.  Respiratory: Negative for cough, hemoptysis, shortness of breath and difficulty breathing.   Cardiovascular: Positive for swelling in legs/feet. Negative for chest pain and palpitations.  Gastrointestinal: Positive for constipation. Negative for abdominal pain, blood in stool and diarrhea.  Endocrine: Negative for increased urination.  Genitourinary: Negative for painful urination.  Musculoskeletal: Negative for arthralgias, joint pain, joint swelling, myalgias, muscle weakness, morning stiffness, muscle tenderness and myalgias.  Skin: Negative for color change, rash and redness.  Allergic/Immunologic: Negative for susceptible to infections.  Neurological: Negative for dizziness, numbness, headaches, memory loss and weakness.   Hematological: Negative for swollen glands.  Psychiatric/Behavioral: Negative for confusion and sleep disturbance.    PMFS History:  Patient Active Problem List   Diagnosis Date Noted  . Tricuspid valve insufficiency 09/25/2020  . Palpitations 09/25/2020  . CKD (chronic kidney disease), stage II 09/25/2020  . Murmur 07/18/2017  . Jaundice 05/03/2016  . Elevated LFTs   . Abdominal pain 04/30/2016  . Renal insufficiency 03/05/2013  . HTN (hypertension) 03/06/2012  . Prostate cancer ([place]) 10/04/2011    Past Medical History:  Diagnosis Date  . Hypertension   . Paget disease of bone    Questionable diagnosis.   . Prostate cancer [facility])     Family History  Problem Relation Age of Onset  . Hypertension Sister   . Hypertension Brother   . Hypertension Mother   . Hypertension Brother   . Stroke Sister   . Hypertension Sister   . Colon cancer Neg Hx    Past Surgical History:  Procedure Laterality Date  . BLADDER SURGERY    . FINGER GANGLION CYST EXCISION  2011   3rd finger right hand  . HYDROCELE EXCISION  2012  . PENILE PROSTHESIS IMPLANT     Social History   Social History Narrative  . Not on file   Immunization History  Administered Date(s) Administered  . DTaP 03/22/2008  . Gamma Globulin 06/08/1994  . H1N1 10/03/2004  . Hepatitis A 03/29/1996, 06/10/1998  . Hepatitis B 03/22/2008, 10/07/2008  . IPV 03/18/2003  . Meningococcal Conjugate 03/22/2008  . PFIZER SARS-COV-2 Vaccination 01/20/2020, 02/12/2020, 07/30/2020  . Pneumococcal Conjugate-13 05/02/2010, 01/31/2019  . Pneumococcal Polysaccharide-23 03/04/2014  .  Pneumococcal-Unspecified 05/02/2010  . Tdap 03/22/2008, 03/24/2018  . Typhoid Parenteral 05/29/2002  . Yellow Fever 03/22/2008  . Zoster 03/29/2010     Objective: Vital Signs: BP 107/68 (BP Location: Left Arm, Patient Position: Sitting, Cuff Size: Large)   Pulse (!) 53   Ht 6\' 1"  (1.854 m)   Wt 237 lb 6.4 oz (107.7 kg)   BMI 31.32 kg/m     Physical Exam Vitals and nursing note reviewed.  Constitutional:      Appearance: He is well-developed and well-nourished.  HENT:     Head: Normocephalic and atraumatic.  Eyes:     Extraocular Movements: EOM normal.     Conjunctiva/sclera: Conjunctivae normal.     Pupils: Pupils are equal, round, and reactive to light.  Cardiovascular:     Rate and Rhythm: Normal rate and regular rhythm.     Heart sounds: Normal heart sounds.  Pulmonary:     Effort: Pulmonary effort is normal.     Breath sounds: Normal breath sounds.  Abdominal:     General: Bowel sounds are normal.     Palpations: Abdomen is soft.  Musculoskeletal:     Cervical back: Normal range of motion and neck supple.  Skin:    General: Skin is warm and dry.     Capillary Refill: Capillary refill takes less than 2 seconds.  Neurological:     Mental Status: He is alert and oriented to person, place, and time.  Psychiatric:        Mood and Affect: Mood and affect normal.        Behavior: Behavior normal.      Musculoskeletal Exam: C-spine, thoracic and lumbar spine were in good range of motion.  Shoulder joints, elbow joints, wrist joints, MCPs PIPs and DIPs with good range of motion with no synovitis.  Hip joints, knee joints, ankles, MTPs and PIPs with good range of motion with no synovitis.  CDAI Exam: CDAI Score: -- Patient Global: --; Provider Global: -- Swollen: --; Tender: -- Joint Exam 12/02/2020   No joint exam has been documented for this visit   There is currently no information documented on the homunculus. Go to the Rheumatology activity and complete the homunculus joint exam.  Investigation: No additional findings.  Imaging: No results found.  Recent Labs: Lab Results  Component Value Date   WBC 4.4 04/25/2020   HGB 12.6 (L) 04/25/2020   PLT 179 04/25/2020   NA 137 11/18/2020   K 4.4 11/18/2020   CL 102 11/18/2020   CO2 26 11/18/2020   GLUCOSE 99 11/18/2020   BUN 21 11/18/2020    CREATININE 1.38 (H) 11/18/2020   BILITOT 0.5 11/18/2020   ALKPHOS 361 (H) 02/07/2020   AST 19 11/18/2020   ALT 16 11/18/2020   PROT 7.1 11/18/2020   ALBUMIN 4.3 02/07/2020   CALCIUM 9.3 11/18/2020   GFRAA 59 (L) 11/18/2020    Speciality Comments: No specialty comments available.  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  Patient received Reclast 5 mg IV on May 09, 2020.  He denies any discomfort in the pelvis and sacrum.  He had good range of motion of bilateral hip joints.  He had good flexibility of the lumbar spine.  Medication monitoring encounter -his labs done in December showed alkaline phosphatase of 80.  His alkaline phosphatase level since remarkably improved from the previous value of 361.  GFR continues to be low in 50s.  Plan: COMPLETE METABOLIC PANEL WITH GFR  Elevated alkaline phosphatase level-normalized now.  Essential hypertension-blood pressure is well controlled.  Prostate cancer (HCC)-no recurrence per patient.  Renal insufficiency-GFR is in 84s and stable.  Orders: Orders Placed This Encounter  Procedures  . COMPLETE METABOLIC PANEL WITH GFR   No orders of the defined types were placed in this encounter.     Follow-Up Instructions: Return in about 6 months (around 06/01/2021) for Paget's .   Bo Merino, MD  Note - This record has been created using Editor, commissioning.  Chart creation errors have been sought, but may not always  have been located. Such creation errors do not reflect on  the standard of medical care.

## 2020-11-19 LAB — COMPLETE METABOLIC PANEL WITH GFR
AG Ratio: 1.4 (calc) (ref 1.0–2.5)
ALT: 16 U/L (ref 9–46)
AST: 19 U/L (ref 10–35)
Albumin: 4.2 g/dL (ref 3.6–5.1)
Alkaline phosphatase (APISO): 80 U/L (ref 35–144)
BUN/Creatinine Ratio: 15 (calc) (ref 6–22)
BUN: 21 mg/dL (ref 7–25)
CO2: 26 mmol/L (ref 20–32)
Calcium: 9.3 mg/dL (ref 8.6–10.3)
Chloride: 102 mmol/L (ref 98–110)
Creat: 1.38 mg/dL — ABNORMAL HIGH (ref 0.70–1.18)
GFR, Est African American: 59 mL/min/{1.73_m2} — ABNORMAL LOW (ref >=60)
GFR, Est Non African American: 51 mL/min/{1.73_m2} — ABNORMAL LOW (ref >=60)
Globulin: 2.9 g/dL (calc) (ref 1.9–3.7)
Glucose, Bld: 99 mg/dL (ref 65–99)
Potassium: 4.4 mmol/L (ref 3.5–5.3)
Sodium: 137 mmol/L (ref 135–146)
Total Bilirubin: 0.5 mg/dL (ref 0.2–1.2)
Total Protein: 7.1 g/dL (ref 6.1–8.1)

## 2020-11-19 NOTE — Progress Notes (Signed)
GFR is low but is stable

## 2020-12-02 ENCOUNTER — Encounter: Payer: Self-pay | Admitting: Rheumatology

## 2020-12-02 ENCOUNTER — Ambulatory Visit: Payer: Medicare PPO | Admitting: Rheumatology

## 2020-12-02 ENCOUNTER — Other Ambulatory Visit: Payer: Self-pay

## 2020-12-02 VITALS — BP 107/68 | HR 53 | Ht 73.0 in | Wt 237.4 lb

## 2020-12-02 DIAGNOSIS — M889 Osteitis deformans of unspecified bone: Secondary | ICD-10-CM

## 2020-12-02 DIAGNOSIS — N289 Disorder of kidney and ureter, unspecified: Secondary | ICD-10-CM | POA: Diagnosis not present

## 2020-12-02 DIAGNOSIS — Z5181 Encounter for therapeutic drug level monitoring: Secondary | ICD-10-CM

## 2020-12-02 DIAGNOSIS — H524 Presbyopia: Secondary | ICD-10-CM | POA: Diagnosis not present

## 2020-12-02 DIAGNOSIS — I10 Essential (primary) hypertension: Secondary | ICD-10-CM

## 2020-12-02 DIAGNOSIS — H349 Unspecified retinal vascular occlusion: Secondary | ICD-10-CM | POA: Diagnosis not present

## 2020-12-02 DIAGNOSIS — R748 Abnormal levels of other serum enzymes: Secondary | ICD-10-CM

## 2020-12-02 DIAGNOSIS — C61 Malignant neoplasm of prostate: Secondary | ICD-10-CM

## 2020-12-02 DIAGNOSIS — H5203 Hypermetropia, bilateral: Secondary | ICD-10-CM | POA: Diagnosis not present

## 2020-12-08 ENCOUNTER — Encounter: Payer: Self-pay | Admitting: Family Medicine

## 2020-12-08 ENCOUNTER — Other Ambulatory Visit: Payer: Self-pay

## 2020-12-08 ENCOUNTER — Ambulatory Visit: Payer: Medicare PPO | Admitting: Family Medicine

## 2020-12-08 VITALS — BP 140/75 | HR 58 | Temp 98.2°F | Ht 73.0 in | Wt 236.2 lb

## 2020-12-08 DIAGNOSIS — Z298 Encounter for other specified prophylactic measures: Secondary | ICD-10-CM

## 2020-12-08 DIAGNOSIS — Z7184 Encounter for health counseling related to travel: Secondary | ICD-10-CM | POA: Diagnosis not present

## 2020-12-08 DIAGNOSIS — Z20822 Contact with and (suspected) exposure to covid-19: Secondary | ICD-10-CM | POA: Diagnosis not present

## 2020-12-08 MED ORDER — DOXYCYCLINE HYCLATE 100 MG PO TABS: 100.0000 mg | ORAL_TABLET | Freq: Every day | ORAL | 0 refills | Status: DC

## 2020-12-08 NOTE — Patient Instructions (Addendum)
Have a safe trip. Start doxycycline 1-2 days before traveling, continue for 4 weeks once you return.   If you have lab work done today you will be contacted with your lab results within the next 2 weeks.  If you have not heard from [place] then please contact us. The fastest way to get your results is to register for My Chart.   IF you received an x-ray today, you will receive an invoice from [facility]. Please contact [facility] at [phone] with questions or concerns regarding your invoice.   IF you received labwork today, you will receive an invoice from [facility]. Please contact [facility] at [phone] with questions or concerns regarding your invoice.   Our billing staff will not be able to assist you with questions regarding bills from these companies.  You will be contacted with the lab results as soon as they are available. The fastest way to get your results is to activate your My Chart account. Instructions are located on the last page of this paperwork. If you have not heard from [place] regarding the results in 2 weeks, please contact this office.

## 2020-12-08 NOTE — Progress Notes (Signed)
Subjective:  Patient ID: Jeffrey Perry, male    DOB: 07-18-1946  Age: 75 y.o. MRN: [id number]  CC:  Chief Complaint  Patient presents with  . Travel Consult    Patient need rx anti-malaria for traveling.    HPI Jeffrey Perry presents for   Travel counseling. Malaria prophylaxis needed for travel. doxycycline has worked well.  Will be leaving in 3 days for [place] for 7 weeks. Visiting family.  Last typhoid vaccine: May 2018 (booster due 5 yrs).  Yellow fever vaccine 2009.  Had covid test at [facility] this morning.    Immunization History  Administered Date(s) Administered  . DTaP 03/22/2008  . Gamma Globulin 06/08/1994  . H1N1 10/03/2004  . Hepatitis A 03/29/1996, 06/10/1998  . Hepatitis B 03/22/2008, 10/07/2008  . IPV 03/18/2003  . Meningococcal Conjugate 03/22/2008  . PFIZER SARS-COV-2 Vaccination 01/20/2020, 02/12/2020, 07/30/2020  . Pneumococcal Conjugate-13 05/02/2010, 01/31/2019  . Pneumococcal Polysaccharide-23 03/04/2014  . Pneumococcal-Unspecified 05/02/2010  . Tdap 03/22/2008, 03/24/2018  . Typhoid Parenteral 05/29/2002  . Yellow Fever 03/22/2008  . Zoster 03/29/2010    History Patient Active Problem List   Diagnosis Date Noted  . Tricuspid valve insufficiency 09/25/2020  . Palpitations 09/25/2020  . CKD (chronic kidney disease), stage II 09/25/2020  . Murmur 07/18/2017  . Jaundice 05/03/2016  . Elevated LFTs   . Abdominal pain 04/30/2016  . Renal insufficiency 03/05/2013  . HTN (hypertension) 03/06/2012  . Prostate cancer ([place]) 10/04/2011   Past Medical History:  Diagnosis Date  . Hypertension   . Paget disease of bone    Questionable diagnosis.   . Prostate cancer [facility])    Past Surgical History:  Procedure Laterality Date  . BLADDER SURGERY    . FINGER GANGLION CYST EXCISION  2011   3rd finger right hand  . HYDROCELE EXCISION  2012  . PENILE PROSTHESIS IMPLANT     Allergies  Allergen Reactions  . Latex Itching  . Lisinopril Cough  .  Neuromuscular Blocking Agents Swelling    Ankle swelling Ankle swelling  . Phenazopyridine Hcl Swelling    Ankle swelling  . Pyridium [Phenazopyridine Hcl] Swelling    Ankle swelling  . Sulfonamide Derivatives Itching   Prior to Admission medications   Medication Sig Start Date End Date Taking? Authorizing Provider  alfuzosin (UROXATRAL) 10 MG 24 hr tablet TAKE ONE TABLET BY MOUTH EVERY MORNING WITH BREAKFAST 08/08/20  Yes [name], MD  diltiazem [facility]) 180 MG 24 hr capsule Take 1 capsule (180 mg total) by mouth at bedtime. 08/08/20  Yes [name], MD  losartan-hydrochlorothiazide (HYZAAR) 100-25 MG tablet Take 1 tablet by mouth daily. 08/08/20  Yes [name], MD   Social History   Socioeconomic History  . Marital status: Married    Spouse name: Not on file  . Number of children: Not on file  . Years of education: Not on file  . Highest education level: Not on file  Occupational History  . Occupation: professor    Fish farm manager: [employer]  Tobacco Use  . Smoking status: Never Smoker  . Smokeless tobacco: Never Used  Vaping Use  . Vaping Use: Never used  Substance and Sexual Activity  . Alcohol use: Yes    Comment: Beer occ  . Drug use: No  . Sexual activity: Yes    Partners: Female  Other Topics Concern  . Not on file  Social History Narrative  . Not on file   Social Determinants of Health   Financial  Resource Strain: Not on file  Food Insecurity: Not on file  Transportation Needs: Not on file  Physical Activity: Not on file  Stress: Not on file  Social Connections: Not on file  Intimate Partner Violence: Not on file    Review of Systems   Objective:   Vitals:   12/08/20 1503  BP: 140/75  Pulse: (!) 58  Temp: 98.2 F (36.8 C)  TempSrc: Temporal  SpO2: 99%  Weight: 236 lb 3.2 oz (107.1 kg)  Height: 6\' 1"  (1.854 m)     Physical Exam Vitals reviewed.  Constitutional:      General: He is not in acute distress.    Appearance: He is  well-developed and well-nourished.  HENT:     Head: Normocephalic and atraumatic.  Cardiovascular:     Rate and Rhythm: Normal rate.  Pulmonary:     Effort: Pulmonary effort is normal.  Neurological:     Mental Status: He is alert and oriented to person, place, and time.  Psychiatric:        Mood and Affect: Mood and affect normal.      Assessment & Plan:  Jeffrey Perry is a 75 y.o. male . Need for malaria prophylaxis - Plan: doxycycline (VIBRA-TABS) 100 MG tablet  Travel advice encounter - Plan: doxycycline (VIBRA-TABS) 100 MG tablet  -Immunizations reviewed, travel advice encounter did malaria prophylaxis with doxycycline and indications/usage discussed.  Insect repellent also recommended.  Meds ordered this encounter  Medications  . doxycycline (VIBRA-TABS) 100 MG tablet    Sig: Take 1 tablet (100 mg total) by mouth daily.    Dispense:  80 tablet    Refill:  0   Patient Instructions   Have a safe trip. Start doxycycline 1-2 days before traveling, continue for 4 weeks once you return.   If you have lab work done today you will be contacted with your lab results within the next 2 weeks.  If you have not heard from [place] then please contact us. The fastest way to get your results is to register for My Chart.   IF you received an x-ray today, you will receive an invoice from [facility]. Please contact [facility] at [phone] with questions or concerns regarding your invoice.   IF you received labwork today, you will receive an invoice from [place]. Please contact [facility] at [phone] with questions or concerns regarding your invoice.   Our billing staff will not be able to assist you with questions regarding bills from these companies.  You will be contacted with the lab results as soon as they are available. The fastest way to get your results is to activate your My Chart account. Instructions are located on the last page of this paperwork. If you have  not heard from [place] regarding the results in 2 weeks, please contact this office.          Signed, [name], MD [facility]

## 2021-02-15 ENCOUNTER — Other Ambulatory Visit: Payer: Self-pay | Admitting: Family Medicine

## 2021-02-15 DIAGNOSIS — I10 Essential (primary) hypertension: Secondary | ICD-10-CM

## 2021-02-15 NOTE — Telephone Encounter (Signed)
Requested Prescriptions  Pending Prescriptions Disp Refills  . losartan-hydrochlorothiazide (HYZAAR) 100-25 MG tablet [Pharmacy Med Name: LOSARTAN-HCTZ 100-25 MG TAB] 90 tablet 0    Sig: TAKE ONE TABLET BY MOUTH DAILY     Cardiovascular: ARB + Diuretic Combos Failed - 02/15/2021 11:19 AM      Failed - Cr in normal range and within 180 days    Creat  Date Value Ref Range Status  11/18/2020 1.38 (H) 0.70 - 1.18 mg/dL Final    Comment:    For patients >71 years of age, the reference limit for Creatinine is approximately 13% higher for people identified as African-American. .          Failed - Last BP in normal range    BP Readings from Last 1 Encounters:  12/08/20 140/75         Passed - K in normal range and within 180 days    Potassium  Date Value Ref Range Status  11/18/2020 4.4 3.5 - 5.3 mmol/L Final         Passed - Na in normal range and within 180 days    Sodium  Date Value Ref Range Status  11/18/2020 137 135 - 146 mmol/L Final  02/07/2020 137 134 - 144 mmol/L Final         Passed - Ca in normal range and within 180 days    Calcium  Date Value Ref Range Status  11/18/2020 9.3 8.6 - 10.3 mg/dL Final         Passed - Patient is not pregnant      Passed - Valid encounter within last 6 months    Recent Outpatient Visits          2 months ago Need for malaria prophylaxis   [facility], MD   6 months ago Essential hypertension   [facility], Ranell Patrick, MD   1 year ago Medicare annual wellness visit, subsequent   [facility], Ranell Patrick, MD   1 year ago Elevated alkaline phosphatase level   [facility], Ranell Patrick, MD   1 year ago Essential hypertension   [facility], Ranell Patrick, MD      Future Appointments            In 1 month Carlota Raspberry, Ranell Patrick, MD [facility] [place]   In 3 months Bo Merino, [place]  [facility]

## 2021-02-17 DIAGNOSIS — H349 Unspecified retinal vascular occlusion: Secondary | ICD-10-CM | POA: Diagnosis not present

## 2021-02-20 ENCOUNTER — Other Ambulatory Visit: Payer: Self-pay | Admitting: Family Medicine

## 2021-02-20 DIAGNOSIS — I10 Essential (primary) hypertension: Secondary | ICD-10-CM

## 2021-02-23 ENCOUNTER — Ambulatory Visit: Payer: Self-pay | Admitting: Family Medicine

## 2021-02-27 ENCOUNTER — Telehealth: Payer: Self-pay

## 2021-02-27 DIAGNOSIS — R002 Palpitations: Secondary | ICD-10-CM

## 2021-02-27 NOTE — Telephone Encounter (Signed)
I did attempt to contact patient to discuss further- unable to reach him, attempted to leave message to call back. Will send another message back via mychart to advise I will notify Dr.Hochrein and get his recommendations.

## 2021-03-02 ENCOUNTER — Ambulatory Visit (INDEPENDENT_AMBULATORY_CARE_PROVIDER_SITE_OTHER): Payer: Medicare PPO

## 2021-03-02 ENCOUNTER — Encounter: Payer: Self-pay | Admitting: Radiology

## 2021-03-02 DIAGNOSIS — R002 Palpitations: Secondary | ICD-10-CM

## 2021-03-02 NOTE — Progress Notes (Signed)
Enrolled patient for a 7 day Zio XT monitor to be mailed to patients home.  

## 2021-03-04 DIAGNOSIS — R002 Palpitations: Secondary | ICD-10-CM | POA: Diagnosis not present

## 2021-03-10 DIAGNOSIS — C61 Malignant neoplasm of prostate: Secondary | ICD-10-CM | POA: Diagnosis not present

## 2021-03-10 DIAGNOSIS — Z8546 Personal history of malignant neoplasm of prostate: Secondary | ICD-10-CM | POA: Diagnosis not present

## 2021-03-17 DIAGNOSIS — R002 Palpitations: Secondary | ICD-10-CM | POA: Diagnosis not present

## 2021-03-23 ENCOUNTER — Other Ambulatory Visit: Payer: Self-pay

## 2021-03-23 ENCOUNTER — Ambulatory Visit: Payer: Medicare PPO | Admitting: Family Medicine

## 2021-03-23 VITALS — BP 136/68 | HR 60 | Temp 98.0°F | Resp 16 | Ht 73.0 in | Wt 238.8 lb

## 2021-03-23 DIAGNOSIS — I10 Essential (primary) hypertension: Secondary | ICD-10-CM

## 2021-03-23 DIAGNOSIS — N1831 Chronic kidney disease, stage 3a: Secondary | ICD-10-CM

## 2021-03-23 DIAGNOSIS — R42 Dizziness and giddiness: Secondary | ICD-10-CM

## 2021-03-23 NOTE — Progress Notes (Signed)
Subjective:  Patient ID: Jeffrey Perry, male    DOB: [birth year]  Age: 75 y.o. MRN: [id number]  CC:  Chief Complaint  Patient presents with  . Hypertension    Pt following up but doing well, has had appointment regarding irregular heart beat which he would like to discuss but doing well over all.     HPI [name] presents for   Hypertension: With CKD stage II.  Tricuspid regurgitation, and arrhythmia, previously thought to have PACs, treated by Dr. Percival Spanish.  Collapsed when he was in [place]. Treated for dehydration. Had not eaten well day or 2 prior.  Had testing - no known cause.  No recurrence of syncope.  Recently underwent cardiac monitoring, note reviewed from April 23, no significant arrhythmias.  A few premature beats/PAC, brief runs of atrial bigeminy, no sustained arrhythmias. no change in therapy. Has noticed a few times where feel dizzy - has to sit down. Usually in am, alfuzosin in evening. Not checking BP at time of dizziness.  No focal weakness.  No headaches.  Has decreased exercise.   Home readings: BP Readings from Last 3 Encounters:  03/23/21 136/68  12/08/20 140/75  12/02/20 107/68   Lab Results  Component Value Date   CREATININE 1.38 (H) 11/18/2020     History Patient Active Problem List   Diagnosis Date Noted  . Tricuspid valve insufficiency 09/25/2020  . Palpitations 09/25/2020  . CKD (chronic kidney disease), stage II 09/25/2020  . Murmur 07/18/2017  . Jaundice 05/03/2016  . Elevated LFTs   . Abdominal pain 04/30/2016  . Renal insufficiency 03/05/2013  . HTN (hypertension) 03/06/2012  . Prostate cancer ([place]) 10/04/2011   Past Medical History:  Diagnosis Date  . Hypertension   . Paget disease of bone    Questionable diagnosis.   . Prostate cancer [facility])    Past Surgical History:  Procedure Laterality Date  . BLADDER SURGERY    . FINGER GANGLION CYST EXCISION  2011   3rd finger right hand  . HYDROCELE EXCISION  2012  . PENILE PROSTHESIS  IMPLANT     Allergies  Allergen Reactions  . Latex Itching  . Lisinopril Cough  . Neuromuscular Blocking Agents Swelling    Ankle swelling Ankle swelling  . Phenazopyridine Hcl Swelling    Ankle swelling  . Pyridium [Phenazopyridine Hcl] Swelling    Ankle swelling  . Sulfonamide Derivatives Itching   Prior to Admission medications   Medication Sig Start Date End Date Taking? Authorizing Provider  alfuzosin (UROXATRAL) 10 MG 24 hr tablet TAKE ONE TABLET BY MOUTH EVERY MORNING WITH BREAKFAST 08/08/20  Yes [name], MD  diltiazem [facility]) 180 MG 24 hr capsule TAKE ONE CAPSULE BY MOUTH AT BEDTIME 02/20/21  Yes [name], MD  losartan-hydrochlorothiazide [facility]) 100-25 MG tablet TAKE ONE TABLET BY MOUTH DAILY 02/15/21  Yes [name], MD   Social History   Socioeconomic History  . Marital status: Married    Spouse name: Not on file  . Number of children: Not on file  . Years of education: Not on file  . Highest education level: Not on file  Occupational History  . Occupation: professor    Fish farm manager: [employer]  Tobacco Use  . Smoking status: Never Smoker  . Smokeless tobacco: Never Used  Vaping Use  . Vaping Use: Never used  Substance and Sexual Activity  . Alcohol use: Yes    Comment: Beer occ  . Drug use: No  . Sexual activity: Yes  Partners: Female  Other Topics Concern  . Not on file  Social History Narrative  . Not on file   Social Determinants of Health   Financial Resource Strain: Not on file  Food Insecurity: Not on file  Transportation Needs: Not on file  Physical Activity: Not on file  Stress: Not on file  Social Connections: Not on file  Intimate Partner Violence: Not on file    Review of Systems  Constitutional: Negative for fatigue and unexpected weight change.  Eyes: Negative for visual disturbance.  Respiratory: Negative for cough, chest tightness and shortness of breath.   Cardiovascular: Positive for palpitations. Negative for  chest pain and leg swelling.  Gastrointestinal: Negative for abdominal pain and blood in stool.  Neurological: Positive for dizziness and light-headedness. Negative for tremors, seizures, syncope, facial asymmetry, weakness and headaches.    Objective:   Vitals:   03/23/21 1501  BP: 136/68  Pulse: 60  Resp: 16  Temp: 98 F (36.7 C)  TempSrc: Temporal  SpO2: 97%  Weight: 238 lb 12.8 oz (108.3 kg)  Height: 6\' 1"  (1.854 m)     Physical Exam Vitals reviewed.  Constitutional:      Appearance: He is well-developed.  HENT:     Head: Normocephalic and atraumatic.  Eyes:     Pupils: Pupils are equal, round, and reactive to light.  Neck:     Vascular: No carotid bruit or JVD.  Cardiovascular:     Rate and Rhythm: Normal rate and regular rhythm.     Heart sounds: Normal heart sounds. No murmur heard.   Pulmonary:     Effort: Pulmonary effort is normal.     Breath sounds: Normal breath sounds. No rales.  Skin:    General: Skin is warm and dry.  Neurological:     General: No focal deficit present.     Mental Status: He is alert and oriented to person, place, and time.     Cranial Nerves: No cranial nerve deficit.     Sensory: No sensory deficit.     Motor: No weakness.     Coordination: Coordination normal.     Gait: Gait normal.  Psychiatric:        Mood and Affect: Mood normal.        Behavior: Behavior normal.      Assessment & Plan:  Jeffrey Perry is a 75 y.o. male . Essential hypertension - Plan: CBC, Basic metabolic panel  Stage 3a chronic kidney disease ([place]) - Plan: Basic metabolic panel  Episode of dizziness - Plan: CBC, Basic metabolic panel  Hypertension stable.  Recent cardiac monitoring reassuring.  Hesitant to stop or change meds given borderline control.  Episodes of lightheadedness appear to be in the morning, question whether or not the alfuzosin may be responsible.  Continue to maintain hydration, recommend he discuss options with the alfuzosin with  his urologist.  Monitor BP.  Check labs.  No med changes at this time.  Home monitoring of BP with RTC precautions given if persistent dizziness.  Check labs as above  No orders of the defined types were placed in this encounter.  Patient Instructions  Check your blood pressure if feeling dizzy. You may need to discuss trial off the alfuzosin or other med with urology if that appears to be contributing to dizziness. Make sure to drink plenty of fluids.   Return to the clinic or go to the nearest emergency room if any of your symptoms worsen or new symptoms occur.  Dizziness Dizziness is a common problem. It is a feeling of unsteadiness or light-headedness. You may feel like you are about to faint. Dizziness can lead to injury if you stumble or fall. Anyone can become dizzy, but dizziness is more common in older adults. This condition can be caused by a number of things, including medicines, dehydration, or illness. Follow these instructions at home: Eating and drinking  Drink enough fluid to keep your urine clear or pale yellow. This helps to keep you from becoming dehydrated. Try to drink more clear fluids, such as water.  Do not drink alcohol.  Limit your caffeine intake if told to do so by your health care provider. Check ingredients and nutrition facts to see if a food or beverage contains caffeine.  Limit your salt (sodium) intake if told to do so by your health care provider. Check ingredients and nutrition facts to see if a food or beverage contains sodium. Activity  Avoid making quick movements. ? Rise slowly from chairs and steady yourself until you feel okay. ? In the morning, first sit up on the side of the bed. When you feel okay, stand slowly while you hold onto something until you know that your balance is fine.  If you need to stand in one place for a long time, move your legs often. Tighten and relax the muscles in your legs while you are standing.  Do not drive or use  heavy machinery if you feel dizzy.  Avoid bending down if you feel dizzy. Place items in your home so that they are easy for you to reach without leaning over. Lifestyle  Do not use any products that contain nicotine or tobacco, such as cigarettes and e-cigarettes. If you need help quitting, ask your health care provider.  Try to reduce your stress level by using methods such as yoga or meditation. Talk with your health care provider if you need help to manage your stress. General instructions  Watch your dizziness for any changes.  Take over-the-counter and prescription medicines only as told by your health care provider. Talk with your health care provider if you think that your dizziness is caused by a medicine that you are taking.  Tell a friend or a family member that you are feeling dizzy. If he or she notices any changes in your behavior, have this person call your health care provider.  Keep all follow-up visits as told by your health care provider. This is important. Contact a health care provider if:  Your dizziness does not go away.  Your dizziness or light-headedness gets worse.  You feel nauseous.  You have reduced hearing.  You have new symptoms.  You are unsteady on your feet or you feel like the room is spinning. Get help right away if:  You vomit or have diarrhea and are unable to eat or drink anything.  You have problems talking, walking, swallowing, or using your arms, hands, or legs.  You feel generally weak.  You are not thinking clearly or you have trouble forming sentences. It may take a friend or family member to notice this.  You have chest pain, abdominal pain, shortness of breath, or sweating.  Your vision changes.  You have any bleeding.  You have a severe headache.  You have neck pain or a stiff neck.  You have a fever. These symptoms may represent a serious problem that is an emergency. Do not wait to see if the symptoms will go away.  Get medical  help right away. Call your local emergency services (911 in the [place] Do not drive yourself to the hospital. Summary  Dizziness is a feeling of unsteadiness or light-headedness. This condition can be caused by a number of things, including medicines, dehydration, or illness.  Anyone can become dizzy, but dizziness is more common in older adults.  Drink enough fluid to keep your urine clear or pale yellow. Do not drink alcohol.  Avoid making quick movements if you feel dizzy. Monitor your dizziness for any changes. This information is not intended to replace advice given to you by your health care provider. Make sure you discuss any questions you have with your health care provider. Document Revised: 11/18/2017 Document Reviewed: 12/18/2016 Elsevier Patient Education  2021 [place].      Signed, [name], MD [facility]

## 2021-03-23 NOTE — Patient Instructions (Signed)
Check your blood pressure if feeling dizzy. You may need to discuss trial off the alfuzosin or other med with urology if that appears to be contributing to dizziness. Make sure to drink plenty of fluids.   Return to the clinic or go to the nearest emergency room if any of your symptoms worsen or new symptoms occur.  Dizziness Dizziness is a common problem. It is a feeling of unsteadiness or light-headedness. You may feel like you are about to faint. Dizziness can lead to injury if you stumble or fall. Anyone can become dizzy, but dizziness is more common in older adults. This condition can be caused by a number of things, including medicines, dehydration, or illness. Follow these instructions at home: Eating and drinking  Drink enough fluid to keep your urine clear or pale yellow. This helps to keep you from becoming dehydrated. Try to drink more clear fluids, such as water.  Do not drink alcohol.  Limit your caffeine intake if told to do so by your health care provider. Check ingredients and nutrition facts to see if a food or beverage contains caffeine.  Limit your salt (sodium) intake if told to do so by your health care provider. Check ingredients and nutrition facts to see if a food or beverage contains sodium. Activity  Avoid making quick movements. ? Rise slowly from chairs and steady yourself until you feel okay. ? In the morning, first sit up on the side of the bed. When you feel okay, stand slowly while you hold onto something until you know that your balance is fine.  If you need to stand in one place for a long time, move your legs often. Tighten and relax the muscles in your legs while you are standing.  Do not drive or use heavy machinery if you feel dizzy.  Avoid bending down if you feel dizzy. Place items in your home so that they are easy for you to reach without leaning over. Lifestyle  Do not use any products that contain nicotine or tobacco, such as cigarettes and  e-cigarettes. If you need help quitting, ask your health care provider.  Try to reduce your stress level by using methods such as yoga or meditation. Talk with your health care provider if you need help to manage your stress. General instructions  Watch your dizziness for any changes.  Take over-the-counter and prescription medicines only as told by your health care provider. Talk with your health care provider if you think that your dizziness is caused by a medicine that you are taking.  Tell a friend or a family member that you are feeling dizzy. If he or she notices any changes in your behavior, have this person call your health care provider.  Keep all follow-up visits as told by your health care provider. This is important. Contact a health care provider if:  Your dizziness does not go away.  Your dizziness or light-headedness gets worse.  You feel nauseous.  You have reduced hearing.  You have new symptoms.  You are unsteady on your feet or you feel like the room is spinning. Get help right away if:  You vomit or have diarrhea and are unable to eat or drink anything.  You have problems talking, walking, swallowing, or using your arms, hands, or legs.  You feel generally weak.  You are not thinking clearly or you have trouble forming sentences. It may take a friend or family member to notice this.  You have chest pain, abdominal  pain, shortness of breath, or sweating.  Your vision changes.  You have any bleeding.  You have a severe headache.  You have neck pain or a stiff neck.  You have a fever. These symptoms may represent a serious problem that is an emergency. Do not wait to see if the symptoms will go away. Get medical help right away. Call your local emergency services (911 in the [place] Do not drive yourself to the hospital. Summary  Dizziness is a feeling of unsteadiness or light-headedness. This condition can be caused by a number of things, including  medicines, dehydration, or illness.  Anyone can become dizzy, but dizziness is more common in older adults.  Drink enough fluid to keep your urine clear or pale yellow. Do not drink alcohol.  Avoid making quick movements if you feel dizzy. Monitor your dizziness for any changes. This information is not intended to replace advice given to you by your health care provider. Make sure you discuss any questions you have with your health care provider. Document Revised: 11/18/2017 Document Reviewed: 12/18/2016 Elsevier Patient Education  2021 [employer].

## 2021-03-24 ENCOUNTER — Other Ambulatory Visit: Payer: Self-pay | Admitting: Family Medicine

## 2021-03-24 ENCOUNTER — Encounter: Payer: Self-pay | Admitting: Family Medicine

## 2021-03-24 DIAGNOSIS — E876 Hypokalemia: Secondary | ICD-10-CM

## 2021-03-24 LAB — BASIC METABOLIC PANEL
BUN: 18 mg/dL (ref 6–23)
CO2: 28 mEq/L (ref 19–32)
Calcium: 9.2 mg/dL (ref 8.4–10.5)
Chloride: 101 mEq/L (ref 96–112)
Creatinine, Ser: 1.38 mg/dL (ref 0.40–1.50)
GFR: 50.85 mL/min — ABNORMAL LOW (ref >60.00)
Glucose, Bld: 110 mg/dL — ABNORMAL HIGH (ref 70–99)
Potassium: 3.2 mEq/L — ABNORMAL LOW (ref 3.5–5.1)
Sodium: 136 mEq/L (ref 135–145)

## 2021-03-24 LAB — CBC
HCT: 37.4 % — ABNORMAL LOW (ref 39.0–52.0)
Hemoglobin: 12.1 g/dL — ABNORMAL LOW (ref 13.0–17.0)
MCHC: 32.3 g/dL (ref 30.0–36.0)
MCV: 69.9 fl — ABNORMAL LOW (ref 78.0–100.0)
Platelets: 159 10*3/uL (ref 150.0–400.0)
RBC: 5.36 Mil/uL (ref 4.22–5.81)
RDW: 15.4 % (ref 11.5–15.5)
WBC: 4.5 10*3/uL (ref 4.0–10.5)

## 2021-03-24 MED ORDER — POTASSIUM CHLORIDE CRYS ER 10 MEQ PO TBCR: 10.0000 meq | EXTENDED_RELEASE_TABLET | Freq: Two times a day (BID) | ORAL | 0 refills | Status: DC

## 2021-03-24 NOTE — Progress Notes (Signed)
See lab notes

## 2021-04-02 ENCOUNTER — Other Ambulatory Visit: Payer: Self-pay

## 2021-04-02 ENCOUNTER — Other Ambulatory Visit (INDEPENDENT_AMBULATORY_CARE_PROVIDER_SITE_OTHER): Payer: Medicare PPO

## 2021-04-02 DIAGNOSIS — E876 Hypokalemia: Secondary | ICD-10-CM

## 2021-04-02 LAB — BASIC METABOLIC PANEL
BUN: 12 mg/dL (ref 6–23)
CO2: 26 mEq/L (ref 19–32)
Calcium: 8.7 mg/dL (ref 8.4–10.5)
Chloride: 97 mEq/L (ref 96–112)
Creatinine, Ser: 1.33 mg/dL (ref 0.40–1.50)
GFR: 53.14 mL/min — ABNORMAL LOW (ref >60.00)
Glucose, Bld: 110 mg/dL — ABNORMAL HIGH (ref 70–99)
Potassium: 4 mEq/L (ref 3.5–5.1)
Sodium: 130 mEq/L — ABNORMAL LOW (ref 135–145)

## 2021-04-04 ENCOUNTER — Other Ambulatory Visit: Payer: Self-pay | Admitting: Family Medicine

## 2021-04-04 DIAGNOSIS — E876 Hypokalemia: Secondary | ICD-10-CM

## 2021-04-06 NOTE — Telephone Encounter (Signed)
LFD 03/24/21 #30 Continue with this medication?

## 2021-04-07 ENCOUNTER — Other Ambulatory Visit: Payer: Self-pay | Admitting: Family Medicine

## 2021-04-07 DIAGNOSIS — E876 Hypokalemia: Secondary | ICD-10-CM

## 2021-04-07 DIAGNOSIS — E871 Hypo-osmolality and hyponatremia: Secondary | ICD-10-CM

## 2021-04-07 NOTE — Progress Notes (Signed)
See labs 

## 2021-05-15 ENCOUNTER — Other Ambulatory Visit: Payer: Self-pay | Admitting: Family Medicine

## 2021-05-15 DIAGNOSIS — I10 Essential (primary) hypertension: Secondary | ICD-10-CM

## 2021-05-16 ENCOUNTER — Other Ambulatory Visit: Payer: Self-pay | Admitting: Family Medicine

## 2021-05-16 DIAGNOSIS — E876 Hypokalemia: Secondary | ICD-10-CM

## 2021-05-16 DIAGNOSIS — I10 Essential (primary) hypertension: Secondary | ICD-10-CM

## 2021-05-19 ENCOUNTER — Other Ambulatory Visit: Payer: Self-pay | Admitting: Family Medicine

## 2021-05-19 DIAGNOSIS — I10 Essential (primary) hypertension: Secondary | ICD-10-CM

## 2021-05-20 ENCOUNTER — Other Ambulatory Visit: Payer: Self-pay | Admitting: Family Medicine

## 2021-05-20 DIAGNOSIS — I10 Essential (primary) hypertension: Secondary | ICD-10-CM

## 2021-05-21 NOTE — Progress Notes (Signed)
Office Visit Note  [name]             Date of Birth: 1946/05/30           MRN: [id number]             PCP: [name], MD Referring: [name], MD Visit Date: 06/04/2021 Occupation: @GUAROCC @  Subjective:  Disease management   History of Present Illness: Jeffrey Perry is a 75 y.o. male with a history of Paget's disease.  He denies any discomfort in his hip or pelvic region.  He states he had left trigger thumb injection by Dr. Rip Harbour which is a still bothersome.  Activities of Daily Living:  Patient reports morning stiffness for 0 minutes.   Patient Denies nocturnal pain.  Difficulty dressing/grooming: Denies Difficulty climbing stairs: Denies Difficulty getting out of chair: Denies Difficulty using hands for taps, buttons, cutlery, and/or writing: Denies  Review of Systems  Constitutional:  Negative for fatigue.  HENT:  Negative for mouth sores, mouth dryness and nose dryness.   Eyes:  Negative for pain, itching and dryness.  Respiratory:  Negative for shortness of breath and difficulty breathing.   Cardiovascular:  Negative for chest pain and palpitations.  Gastrointestinal:  Negative for blood in stool, constipation and diarrhea.  Endocrine: Negative for increased urination.  Genitourinary:  Negative for difficulty urinating.  Musculoskeletal:  Negative for joint pain, joint pain, joint swelling, myalgias, morning stiffness, muscle tenderness and myalgias.  Skin:  Negative for color change, rash and redness.  Allergic/Immunologic: Negative for susceptible to infections.  Neurological:  Negative for dizziness, numbness, headaches, memory loss and weakness.  Hematological:  Negative for bruising/bleeding tendency.  Psychiatric/Behavioral:  Negative for confusion.    PMFS History:  Patient Active Problem List   Diagnosis Date Noted   Tricuspid valve insufficiency 09/25/2020   Palpitations 09/25/2020   CKD (chronic kidney disease), stage II  09/25/2020   Murmur 07/18/2017   Jaundice 05/03/2016   Elevated LFTs    Abdominal pain 04/30/2016   Renal insufficiency 03/05/2013   HTN (hypertension) 03/06/2012   Prostate cancer ([place]) 10/04/2011    Past Medical History:  Diagnosis Date   Hypertension    Paget disease of bone    Questionable diagnosis.    Prostate cancer ([place])     Family History  Problem Relation Age of Onset   Hypertension Sister    Hypertension Brother    Hypertension Mother    Hypertension Brother    Stroke Sister    Hypertension Sister    Colon cancer Neg Hx    Past Surgical History:  Procedure Laterality Date   BLADDER SURGERY     FINGER GANGLION CYST EXCISION  2011   3rd finger right hand   HYDROCELE EXCISION  2012   PENILE PROSTHESIS IMPLANT     Social History   Social History Narrative   Not on file   Immunization History  Administered Date(s) Administered   DTaP 03/22/2008   Gamma Globulin 06/08/1994   H1N1 10/03/2004   Hepatitis A 03/29/1996, 06/10/1998   Hepatitis B 03/22/2008, 10/07/2008   IPV 03/18/2003   Meningococcal Conjugate 03/22/2008   PFIZER(Purple Top)SARS-COV-2 Vaccination 01/20/2020, 02/12/2020, 07/30/2020   Pneumococcal Conjugate-13 05/02/2010, 01/31/2019   Pneumococcal Polysaccharide-23 03/04/2014   Pneumococcal-Unspecified 05/02/2010   Tdap 03/22/2008, 03/24/2018   Typhoid Parenteral 05/29/2002   Yellow Fever 03/22/2008   Zoster, Live 03/29/2010     Objective: Vital Signs: BP 125/80 (BP Location: Left Arm, Patient Position: Sitting,  Cuff Size: Normal)   Pulse 60   Resp 17   Ht 6\' 1"  (1.854 m)   Wt 237 lb (107.5 kg)   BMI 31.27 kg/m    Physical Exam Vitals and nursing note reviewed.  Constitutional:      Appearance: He is well-developed.  HENT:     Head: Normocephalic and atraumatic.  Eyes:     Conjunctiva/sclera: Conjunctivae normal.     Pupils: Pupils are equal, round, and reactive to light.  Cardiovascular:     Rate and Rhythm: Normal rate and  regular rhythm.     Heart sounds: Normal heart sounds.  Pulmonary:     Effort: Pulmonary effort is normal.     Breath sounds: Normal breath sounds.  Abdominal:     General: Bowel sounds are normal.     Palpations: Abdomen is soft.  Musculoskeletal:     Cervical back: Normal range of motion and neck supple.  Skin:    General: Skin is warm and dry.     Capillary Refill: Capillary refill takes less than 2 seconds.  Neurological:     Mental Status: He is alert and oriented to person, place, and time.  Psychiatric:        Behavior: Behavior normal.     Musculoskeletal Exam: C-spine, thoracic and lumbar spine were in good range of motion.  Shoulder joints, elbow joints, wrist joints, MCPs PIPs and DIPs with good range of motion.  He had left flexor tendon tenderness.  Hip joints, knee joints, and ankle joints are in good range of motion.  He had no tenderness over ankles or MTPs.    CDAI Exam: CDAI Score: -- Patient Global: --; Provider Global: -- Swollen: --; Tender: -- Joint Exam 06/04/2021   No joint exam has been documented for this visit   There is currently no information documented on the homunculus. Go to the Rheumatology activity and complete the homunculus joint exam.  Investigation: No additional findings.  Imaging: No results found.  Recent Labs: Lab Results  Component Value Date   WBC 4.5 03/23/2021   HGB 12.1 (L) 03/23/2021   PLT 159.0 03/23/2021   NA 130 (L) 04/02/2021   K 4.0 04/02/2021   CL 97 04/02/2021   CO2 26 04/02/2021   GLUCOSE 110 (H) 04/02/2021   BUN 12 04/02/2021   CREATININE 1.33 04/02/2021   BILITOT 0.5 11/18/2020   ALKPHOS 361 (H) 02/07/2020   AST 19 11/18/2020   ALT 16 11/18/2020   PROT 7.1 11/18/2020   ALBUMIN 4.3 02/07/2020   CALCIUM 8.7 04/02/2021   GFRAA 59 (L) 11/18/2020   November 18, 2021 CMP with GFR creatinine 1.38, GFR 59, alkaline phosphatase 80  Speciality Comments: No specialty comments available.  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  Patient received Reclast 5 mg IV on May 09, 2020.  He denies any pelvic or hip pain now.  I advised him to contact me  in case he develops any increased pain.  Increased risk of bone cancer was also discussed with Paget's disease.  Elevated alkaline phosphatase level - his labs done in December showed alkaline phosphatase of 80.  His alkaline phosphatase level since remarkably improved from the previous value of 361.  I will repeat CMP with GFR today.  Advised him to get CMP  with GFR few days prior to his next appointment.  Renal insufficiency-his GFR is too unstable for which she has been followed by his PCP.  Trigger thumb, left thumb-he had a trigger finger injection by Dr. Rip Harbour last week.  Prostate cancer ([place]) - no recurrence per patient.  Essential hypertension-his blood pressure is well controlled now.  Orders: Orders Placed This Encounter  Procedures   COMPLETE METABOLIC PANEL WITH GFR   COMPLETE METABOLIC PANEL WITH GFR    No orders of the defined types were placed in this encounter.    Follow-Up Instructions: Return in about 6 months (around 12/05/2021) for Paget's disease.   Bo Merino, MD  Note - This record has been created using Editor, commissioning.  Chart creation errors have been sought, but may not always  have been located. Such creation errors do not reflect on  the standard of medical care.

## 2021-05-26 ENCOUNTER — Other Ambulatory Visit: Payer: Self-pay | Admitting: Family Medicine

## 2021-05-26 ENCOUNTER — Other Ambulatory Visit: Payer: Self-pay

## 2021-05-26 DIAGNOSIS — I10 Essential (primary) hypertension: Secondary | ICD-10-CM

## 2021-05-26 MED ORDER — LOSARTAN POTASSIUM-HCTZ 100-25 MG PO TABS: 1.0000 | ORAL_TABLET | Freq: Every day | ORAL | 0 refills | Status: DC

## 2021-05-26 NOTE — Telephone Encounter (Signed)
Medication sent to pharmacy  

## 2021-06-04 ENCOUNTER — Other Ambulatory Visit: Payer: Self-pay

## 2021-06-04 ENCOUNTER — Ambulatory Visit: Payer: Medicare PPO | Admitting: Rheumatology

## 2021-06-04 ENCOUNTER — Encounter: Payer: Self-pay | Admitting: Rheumatology

## 2021-06-04 VITALS — BP 125/80 | HR 60 | Resp 17 | Ht 73.0 in | Wt 237.0 lb

## 2021-06-04 DIAGNOSIS — C61 Malignant neoplasm of prostate: Secondary | ICD-10-CM

## 2021-06-04 DIAGNOSIS — N289 Disorder of kidney and ureter, unspecified: Secondary | ICD-10-CM | POA: Diagnosis not present

## 2021-06-04 DIAGNOSIS — M65312 Trigger thumb, left thumb: Secondary | ICD-10-CM

## 2021-06-04 DIAGNOSIS — Z5181 Encounter for therapeutic drug level monitoring: Secondary | ICD-10-CM

## 2021-06-04 DIAGNOSIS — R748 Abnormal levels of other serum enzymes: Secondary | ICD-10-CM | POA: Diagnosis not present

## 2021-06-04 DIAGNOSIS — I10 Essential (primary) hypertension: Secondary | ICD-10-CM

## 2021-06-04 DIAGNOSIS — M889 Osteitis deformans of unspecified bone: Secondary | ICD-10-CM

## 2021-06-05 ENCOUNTER — Other Ambulatory Visit: Payer: Self-pay | Admitting: Family Medicine

## 2021-06-05 DIAGNOSIS — E876 Hypokalemia: Secondary | ICD-10-CM

## 2021-06-05 LAB — COMPLETE METABOLIC PANEL WITH GFR
AG Ratio: 1.3 (calc) (ref 1.0–2.5)
ALT: 16 U/L (ref 9–46)
AST: 17 U/L (ref 10–35)
Albumin: 4.1 g/dL (ref 3.6–5.1)
Alkaline phosphatase (APISO): 79 U/L (ref 35–144)
BUN/Creatinine Ratio: 13 (calc) (ref 6–22)
BUN: 18 mg/dL (ref 7–25)
CO2: 26 mmol/L (ref 20–32)
Calcium: 8.9 mg/dL (ref 8.6–10.3)
Chloride: 102 mmol/L (ref 98–110)
Creat: 1.35 mg/dL — ABNORMAL HIGH (ref 0.70–1.18)
GFR, Est African American: 60 mL/min/{1.73_m2} (ref >=60)
GFR, Est Non African American: 52 mL/min/{1.73_m2} — ABNORMAL LOW (ref >=60)
Globulin: 3.1 g/dL (calc) (ref 1.9–3.7)
Glucose, Bld: 68 mg/dL (ref 65–99)
Potassium: 3.9 mmol/L (ref 3.5–5.3)
Sodium: 137 mmol/L (ref 135–146)
Total Bilirubin: 0.4 mg/dL (ref 0.2–1.2)
Total Protein: 7.2 g/dL (ref 6.1–8.1)

## 2021-06-05 NOTE — Telephone Encounter (Signed)
Last bmp drawn 06/04/21 by Dr. Kathi Ludwig, potassium 3.9 Do you want patient to continue with this medication?

## 2021-06-05 NOTE — Progress Notes (Signed)
Creatinine is elevated and stable.  Most likely due to diuretic use.  Alkaline phosphatase is normal at 79.

## 2021-07-04 ENCOUNTER — Other Ambulatory Visit: Payer: Self-pay | Admitting: Family Medicine

## 2021-07-04 DIAGNOSIS — I10 Essential (primary) hypertension: Secondary | ICD-10-CM

## 2021-08-01 ENCOUNTER — Other Ambulatory Visit: Payer: Self-pay | Admitting: Family Medicine

## 2021-08-01 DIAGNOSIS — E876 Hypokalemia: Secondary | ICD-10-CM

## 2021-08-13 ENCOUNTER — Other Ambulatory Visit: Payer: Self-pay | Admitting: Family Medicine

## 2021-08-13 DIAGNOSIS — I10 Essential (primary) hypertension: Secondary | ICD-10-CM

## 2021-08-29 HISTORY — PX: TRIGGER FINGER RELEASE: SHX641

## 2021-09-21 ENCOUNTER — Ambulatory Visit: Payer: Medicare PPO | Admitting: Family Medicine

## 2021-09-21 ENCOUNTER — Other Ambulatory Visit: Payer: Self-pay

## 2021-09-21 ENCOUNTER — Ambulatory Visit (INDEPENDENT_AMBULATORY_CARE_PROVIDER_SITE_OTHER)
Admission: RE | Admit: 2021-09-21 | Discharge: 2021-09-21 | Disposition: A | Discharge status: Home / Self Care | Payer: Medicare PPO | Source: Ambulatory Visit | Attending: Family Medicine | Admitting: Family Medicine

## 2021-09-21 ENCOUNTER — Encounter: Payer: Self-pay | Admitting: Family Medicine

## 2021-09-21 VITALS — BP 132/66 | HR 66 | Temp 98.1°F | Resp 16 | Ht 73.0 in | Wt 243.6 lb

## 2021-09-21 DIAGNOSIS — I10 Essential (primary) hypertension: Secondary | ICD-10-CM

## 2021-09-21 DIAGNOSIS — E876 Hypokalemia: Secondary | ICD-10-CM

## 2021-09-21 DIAGNOSIS — M545 Low back pain, unspecified: Secondary | ICD-10-CM | POA: Diagnosis not present

## 2021-09-21 DIAGNOSIS — K5900 Constipation, unspecified: Secondary | ICD-10-CM | POA: Diagnosis not present

## 2021-09-21 DIAGNOSIS — N1831 Chronic kidney disease, stage 3a: Secondary | ICD-10-CM

## 2021-09-21 DIAGNOSIS — M889 Osteitis deformans of unspecified bone: Secondary | ICD-10-CM

## 2021-09-21 MED ORDER — DILTIAZEM HCL ER BEADS 180 MG PO CP24: 180.0000 mg | ORAL_CAPSULE | Freq: Every day | ORAL | 1 refills | Status: DC

## 2021-09-21 MED ORDER — POTASSIUM CHLORIDE CRYS ER 10 MEQ PO TBCR: 10.0000 meq | EXTENDED_RELEASE_TABLET | Freq: Two times a day (BID) | ORAL | 2 refills | Status: DC

## 2021-09-21 MED ORDER — ALFUZOSIN HCL ER 10 MG PO TB24: ORAL_TABLET | ORAL | 1 refills | Status: DC

## 2021-09-21 MED ORDER — LOSARTAN POTASSIUM-HCTZ 100-25 MG PO TABS: 1.0000 | ORAL_TABLET | Freq: Every day | ORAL | 1 refills | Status: DC

## 2021-09-21 NOTE — Progress Notes (Signed)
Subjective:  Patient ID: Jeffrey Perry, male    DOB: Apr 09, 1946  Age: 75 y.o. MRN: [id number]  CC:  Chief Complaint  Patient presents with   Chonic Kidney Disease    Pt here for recheck, no concerns at this time.    Hypertension    Pt here for recheck denies physical sxs, no concerns    Back Pain    Pt reports lower back pain over the last week maybe 2 aching feeling does not think it is RT or LT but midline     HPI Jeffrey Perry presents for   Hypertension: With chronic kidney disease. Treated with losartan HCTZ, diltiazem.  Also on potassium 10 mEq daily. Some weight gain with diet on vacation form July to [facility] September.  Home readings: none recent. No new side effects of meds.  Drinking fluids. Ran out of potassium on vacation, irregular noted on last day  - 9/18 - watch told had high heart rate - noted for one day only when flying home. no recurrence back on potassium. Felt ok, no cp, near-syncope or palpitations.  No nsaids.   BP Readings from Last 3 Encounters:  09/21/21 132/66  06/04/21 125/80  03/23/21 136/68   Lab Results  Component Value Date   CREATININE 1.35 (H) 06/04/2021   Wt Readings from Last 3 Encounters:  09/21/21 243 lb 9.6 oz (110.5 kg)  06/04/21 237 lb (107.5 kg)  03/23/21 238 lb 12.8 oz (108.3 kg)    Constipation: Ongoing symptoms. Off and on.  Tx Has miralax at home - ever 2-3 weeks.   Low back pain Past 10 days. Notes with sitting on couch certain way. NKI. Adjusting position helps.  No night sweats/fever/weight loss. No bowel or bladder incontinence, no saddle anesthesia, no lower extremity weakness. No leg radiation.  Hx of Pagets - followed by Dr. Estanislado Pandy. Involved pelvis, sacrum. Treated Reclast in 2021. No return of hip/pelvic pain.  Tx: some relief with Biofreeze   Prostate cancer Treated with external beam radiation, 2012.  [facility], Dr. Louis Meckel.  Radiation cystitis in 2017, treated with fulguration at [facility].  Hematuria at that  time.  Continues on alfuzosin.  Last PSA 0.51 on 03/10/2021  History Patient Active Problem List   Diagnosis Date Noted   Tricuspid valve insufficiency 09/25/2020   Palpitations 09/25/2020   CKD (chronic kidney disease), stage II 09/25/2020   Murmur 07/18/2017   Jaundice 05/03/2016   Elevated LFTs    Abdominal pain 04/30/2016   Renal insufficiency 03/05/2013   HTN (hypertension) 03/06/2012   Prostate cancer ([place]) 10/04/2011   Past Medical History:  Diagnosis Date   Hypertension    Paget disease of bone    Questionable diagnosis.    Prostate cancer [facility])    Past Surgical History:  Procedure Laterality Date   BLADDER SURGERY     FINGER GANGLION CYST EXCISION  2011   3rd finger right hand   HYDROCELE EXCISION  2012   PENILE PROSTHESIS IMPLANT     Allergies  Allergen Reactions   Latex Itching   Lisinopril Cough   Neuromuscular Blocking Agents Swelling    Ankle swelling Ankle swelling   Phenazopyridine Hcl Swelling    Ankle swelling   Pyridium [Phenazopyridine Hcl] Swelling    Ankle swelling   Sulfonamide Derivatives Itching   Prior to Admission medications   Medication Sig Start Date End Date Taking? Authorizing Provider  alfuzosin (UROXATRAL) 10 MG 24 hr tablet TAKE ONE TABLET BY MOUTH EVERY MORNING WITH  BREAKFAST 08/08/20   [name], MD  diltiazem [facility]) 180 MG 24 hr capsule TAKE ONE CAPSULE BY MOUTH EVERY NIGHT AT BEDTIME 08/14/21   [name], MD  losartan-hydrochlorothiazide [facility]) 100-25 MG tablet TAKE ONE TABLET BY MOUTH DAILY 07/06/21   [name], MD  potassium chloride (KLOR-CON) 10 MEQ tablet TAKE ONE TABLET BY MOUTH TWICE A DAY 08/05/21   [name], MD   Social History   Socioeconomic History   Marital status: Married    Spouse name: Not on file   Number of children: Not on file   Years of education: Not on file   Highest education level: Not on file  Occupational History   Occupation: professor    Employer: [employer]   Tobacco Use   Smoking status: Never   Smokeless tobacco: Never  Vaping Use   Vaping Use: Never used  Substance and Sexual Activity   Alcohol use: Yes    Comment: Beer occ   Drug use: No   Sexual activity: Yes    Partners: Female  Other Topics Concern   Not on file  Social History Narrative   Not on file   Social Determinants of Health   Financial Resource Strain: Not on file  Food Insecurity: Not on file  Transportation Needs: Not on file  Physical Activity: Not on file  Stress: Not on file  Social Connections: Not on file  Intimate Partner Violence: Not on file    Review of Systems  Constitutional:  Negative for fatigue and unexpected weight change.  Eyes:  Negative for visual disturbance.  Respiratory:  Negative for cough, chest tightness and shortness of breath.   Cardiovascular:  Negative for chest pain, palpitations and leg swelling.  Gastrointestinal:  Negative for abdominal pain and blood in stool.  Musculoskeletal:  Positive for back pain. Negative for gait problem, joint swelling and myalgias.  Neurological:  Negative for dizziness, light-headedness and headaches.    Objective:   Vitals:   09/21/21 1311  BP: 132/66  Pulse: 66  Resp: 16  Temp: 98.1 F (36.7 C)  TempSrc: Temporal  SpO2: 96%  Weight: 243 lb 9.6 oz (110.5 kg)  Height: 6\' 1"  (1.854 m)     Physical Exam Vitals reviewed.  Constitutional:      Appearance: He is well-developed.  HENT:     Head: Normocephalic and atraumatic.  Neck:     Vascular: No carotid bruit or JVD.  Cardiovascular:     Rate and Rhythm: Normal rate and regular rhythm.     Heart sounds: Normal heart sounds. No murmur heard. Pulmonary:     Effort: Pulmonary effort is normal.     Breath sounds: Normal breath sounds. No rales.  Abdominal:     General: Abdomen is flat.     Palpations: Abdomen is soft.     Tenderness: There is no abdominal tenderness.  Musculoskeletal:     Right lower leg: No edema.     Left  lower leg: No edema.     Comments: Lumbar spine No midline bony tenderness.  Slight discomfort of the paraspinals lower paraspinals bilaterally.  Minimal discomfort over SI joints.  Negative seated straight leg raise.  Able to walk without difficulty and able to heel and toe walk without difficulty.  Skin:    General: Skin is warm and dry.  Neurological:     Mental Status: He is alert and oriented to person, place, and time.  Psychiatric:  Mood and Affect: Mood normal.       Assessment & Plan:  Jeffrey Perry is a 75 y.o. male . Constipation, unspecified constipation type  -Intermittent symptoms, handout given, MiraLAX discussed if needed up to daily, but prevention with Citrucel or Metamucil daily may also be helpful.  RTC precautions.  Essential hypertension - Plan: losartan-hydrochlorothiazide (HYZAAR) 100-25 MG tablet, Comprehensive metabolic panel, diltiazem (TIAZAC) 180 MG 24 hr capsule, alfuzosin (UROXATRAL) 10 MG 24 hr tablet  -Stable on current regimen, continue same.  Check labs  Acute bilateral low back pain without sciatica - Plan: DG Lumbar Spine Complete Paget's bone disease  -Likely mechanical pain, may be due to some increased weight, but has been returning to walking.  Overall reassuring exam/history but will image given history of Paget's disease.  Symptomatic care with Biofreeze, Tylenol, heat or ice as needed with RTC precautions.  Handout given  Stage 3a chronic kidney disease ([place])  -Check updated labs, continue follow-up with nephrology.  Hypokalemia - Plan: potassium chloride (KLOR-CON) 10 MEQ tablet  -Continue supplementation, check updated labs.  Brief irregular heart rate noted per his watch last month when off potassium, but asymptomatic at the time.  Has not had recurrence.  RTC precautions if any abnormal heart rate, palpitations or new symptoms.  Meds ordered this encounter  Medications   losartan-hydrochlorothiazide (HYZAAR) 100-25 MG tablet     Sig: Take 1 tablet by mouth daily.    Dispense:  90 tablet    Refill:  1   diltiazem (TIAZAC) 180 MG 24 hr capsule    Sig: Take 1 capsule (180 mg total) by mouth at bedtime.    Dispense:  90 capsule    Refill:  1   alfuzosin (UROXATRAL) 10 MG 24 hr tablet    Sig: TAKE ONE TABLET BY MOUTH EVERY MORNING WITH BREAKFAST    Dispense:  90 tablet    Refill:  1   potassium chloride (KLOR-CON) 10 MEQ tablet    Sig: Take 1 tablet (10 mEq total) by mouth 2 (two) times daily.    Dispense:  60 tablet    Refill:  2   Patient Instructions  If any return of abnormal heart rate, or irregular heart rate be seen right away. I will check potassium and electrolytes today.   Xray at [facility] today if possible.  Tylenol, heat or ice, Biofreeze or fine to use if needed temporarily.  If back pain is not improving in the next few weeks or any worsening symptoms please return for recheck.    See info on constipation, miralax up to once per day if needed.  I recommend citrucel or metamucil daily. Return to the clinic or go to the nearest emergency room if any of your symptoms worsen or new symptoms occur.   Acute Back Pain, Adult Acute back pain is sudden and usually short-lived. It is often caused by an injury to the muscles and tissues in the back. The injury may result from: A muscle, tendon, or ligament getting overstretched or torn. Ligaments are tissues that connect bones to each other. Lifting something improperly can cause a back strain. Wear and tear (degeneration) of the spinal disks. Spinal disks are circular tissue that provide cushioning between the bones of the spine (vertebrae). Twisting motions, such as while playing sports or doing yard work. A hit to the back. Arthritis. You may have a physical exam, lab tests, and imaging tests to find the cause of your pain. Acute back pain usually  goes away with rest and home care. Follow these instructions at home: Managing pain, stiffness, and  swelling Take over-the-counter and prescription medicines only as told by your health care provider. Treatment may include medicines for pain and inflammation that are taken by mouth or applied to the skin, or muscle relaxants. Your health care provider may recommend applying ice during the first 24-48 hours after your pain starts. To do this: Put ice in a plastic bag. Place a towel between your skin and the bag. Leave the ice on for 20 minutes, 2-3 times a day. Remove the ice if your skin turns bright red. This is very important. If you cannot feel pain, heat, or cold, you have a greater risk of damage to the area. If directed, apply heat to the affected area as often as told by your health care provider. Use the heat source that your health care provider recommends, such as a moist heat pack or a heating pad. Place a towel between your skin and the heat source. Leave the heat on for 20-30 minutes. Remove the heat if your skin turns bright red. This is especially important if you are unable to feel pain, heat, or cold. You have a greater risk of getting burned. Activity  Do not stay in bed. Staying in bed for more than 1-2 days can delay your recovery. Sit up and stand up straight. Avoid leaning forward when you sit or hunching over when you stand. If you work at a desk, sit close to it so you do not need to lean over. Keep your chin tucked in. Keep your neck drawn back, and keep your elbows bent at a 90-degree angle (right angle). Sit high and close to the steering wheel when you drive. Add lower back (lumbar) support to your car seat, if needed. Take short walks on even surfaces as soon as you are able. Try to increase the length of time you walk each day. Do not sit, drive, or stand in one place for more than 30 minutes at a time. Sitting or standing for long periods of time can put stress on your back. Do not drive or use heavy machinery while taking prescription pain medicine. Use proper  lifting techniques. When you bend and lift, use positions that put less stress on your back: [place] your knees. Keep the load close to your body. Avoid twisting. Exercise regularly as told by your health care provider. Exercising helps your back heal faster and helps prevent back injuries by keeping muscles strong and flexible. Work with a physical therapist to make a safe exercise program, as recommended by your health care provider. Do any exercises as told by your physical therapist. Lifestyle Maintain a healthy weight. Extra weight puts stress on your back and makes it difficult to have good posture. Avoid activities or situations that make you feel anxious or stressed. Stress and anxiety increase muscle tension and can make back pain worse. Learn ways to manage anxiety and stress, such as through exercise. General instructions Sleep on a firm mattress in a comfortable position. Try lying on your side with your knees slightly bent. If you lie on your back, put a pillow under your knees. Keep your head and neck in a straight line with your spine (neutral position) when using electronic equipment like smartphones or pads. To do this: Raise your smartphone or pad to look at it instead of bending your head or neck to look down. Put the smartphone or pad at the  level of your face while looking at the screen. Follow your treatment plan as told by your health care provider. This may include: Cognitive or behavioral therapy. Acupuncture or massage therapy. Meditation or yoga. Contact a health care provider if: You have pain that is not relieved with rest or medicine. You have increasing pain going down into your legs or buttocks. Your pain does not improve after 2 weeks. You have pain at night. You lose weight without trying. You have a fever or chills. You develop nausea or vomiting. You develop abdominal pain. Get help right away if: You develop new bowel or bladder control problems. You  have unusual weakness or numbness in your arms or legs. You feel faint. These symptoms may represent a serious problem that is an emergency. Do not wait to see if the symptoms will go away. Get medical help right away. Call your local emergency services (911 in the [place] Do not drive yourself to the hospital. Summary Acute back pain is sudden and usually short-lived. Use proper lifting techniques. When you bend and lift, use positions that put less stress on your back. Take over-the-counter and prescription medicines only as told by your health care provider, and apply heat or ice as told. This information is not intended to replace advice given to you by your health care provider. Make sure you discuss any questions you have with your health care provider. Document Revised: 02/06/2021 Document Reviewed: 02/06/2021 Elsevier Patient Education  2022 [place].   Constipation, Adult Constipation is when a person has fewer than three bowel movements in a week, has difficulty having a bowel movement, or has stools (feces) that are dry, hard, or larger than normal. Constipation may be caused by an underlying condition. It may become worse with age if a person takes certain medicines and does not take in enough fluids. Follow these instructions at home: Eating and drinking  Eat foods that have a lot of fiber, such as beans, whole grains, and fresh fruits and vegetables. Limit foods that are low in fiber and high in fat and processed sugars, such as fried or sweet foods. These include french fries, hamburgers, cookies, candies, and soda. Drink enough fluid to keep your urine pale yellow. General instructions Exercise regularly or as told by your health care provider. Try to do 150 minutes of moderate exercise each week. Use the bathroom when you have the urge to go. Do not hold it in. Take over-the-counter and prescription medicines only as told by your health care provider. This includes any  fiber supplements. During bowel movements: Practice deep breathing while relaxing the lower abdomen. Practice pelvic floor relaxation. Watch your condition for any changes. Let your health care provider know about them. Keep all follow-up visits as told by your health care provider. This is important. Contact a health care provider if: You have pain that gets worse. You have a fever. You do not have a bowel movement after 4 days. You vomit. You are not hungry or you lose weight. You are bleeding from the opening between the buttocks (anus). You have thin, pencil-like stools. Get help right away if: You have a fever and your symptoms suddenly get worse. You leak stool or have blood in your stool. Your abdomen is bloated. You have severe pain in your abdomen. You feel dizzy or you faint. Summary Constipation is when a person has fewer than three bowel movements in a week, has difficulty having a bowel movement, or has stools (feces) that  are dry, hard, or larger than normal. Eat foods that have a lot of fiber, such as beans, whole grains, and fresh fruits and vegetables. Drink enough fluid to keep your urine pale yellow. Take over-the-counter and prescription medicines only as told by your health care provider. This includes any fiber supplements. This information is not intended to replace advice given to you by your health care provider. Make sure you discuss any questions you have with your health care provider. Document Revised: 10/03/2019 Document Reviewed: 10/03/2019 Elsevier Patient Education  2022 [place],   [name], MD [place] 09/21/21 2:34 PM

## 2021-09-21 NOTE — Addendum Note (Signed)
Addended by: Patrcia Dolly on: 09/21/2021 02:48 PM   Modules accepted: Orders

## 2021-09-21 NOTE — Patient Instructions (Addendum)
If any return of abnormal heart rate, or irregular heart rate be seen right away. I will check potassium and electrolytes today.   Xray at [facility] today if possible.  Tylenol, heat or ice, Biofreeze or fine to use if needed temporarily.  If back pain is not improving in the next few weeks or any worsening symptoms please return for recheck.    See info on constipation, miralax up to once per day if needed.  I recommend citrucel or metamucil daily. Return to the clinic or go to the nearest emergency room if any of your symptoms worsen or new symptoms occur.   Acute Back Pain, Adult Acute back pain is sudden and usually short-lived. It is often caused by an injury to the muscles and tissues in the back. The injury may result from: A muscle, tendon, or ligament getting overstretched or torn. Ligaments are tissues that connect bones to each other. Lifting something improperly can cause a back strain. Wear and tear (degeneration) of the spinal disks. Spinal disks are circular tissue that provide cushioning between the bones of the spine (vertebrae). Twisting motions, such as while playing sports or doing yard work. A hit to the back. Arthritis. You may have a physical exam, lab tests, and imaging tests to find the cause of your pain. Acute back pain usually goes away with rest and home care. Follow these instructions at home: Managing pain, stiffness, and swelling Take over-the-counter and prescription medicines only as told by your health care provider. Treatment may include medicines for pain and inflammation that are taken by mouth or applied to the skin, or muscle relaxants. Your health care provider may recommend applying ice during the first 24-48 hours after your pain starts. To do this: Put ice in a plastic bag. Place a towel between your skin and the bag. Leave the ice on for 20 minutes, 2-3 times a day. Remove the ice if your skin turns bright red. This is very important. If you  cannot feel pain, heat, or cold, you have a greater risk of damage to the area. If directed, apply heat to the affected area as often as told by your health care provider. Use the heat source that your health care provider recommends, such as a moist heat pack or a heating pad. Place a towel between your skin and the heat source. Leave the heat on for 20-30 minutes. Remove the heat if your skin turns bright red. This is especially important if you are unable to feel pain, heat, or cold. You have a greater risk of getting burned. Activity  Do not stay in bed. Staying in bed for more than 1-2 days can delay your recovery. Sit up and stand up straight. Avoid leaning forward when you sit or hunching over when you stand. If you work at a desk, sit close to it so you do not need to lean over. Keep your chin tucked in. Keep your neck drawn back, and keep your elbows bent at a 90-degree angle (right angle). Sit high and close to the steering wheel when you drive. Add lower back (lumbar) support to your car seat, if needed. Take short walks on even surfaces as soon as you are able. Try to increase the length of time you walk each day. Do not sit, drive, or stand in one place for more than 30 minutes at a time. Sitting or standing for long periods of time can put stress on your back. Do not drive or use  heavy machinery while taking prescription pain medicine. Use proper lifting techniques. When you bend and lift, use positions that put less stress on your back: [place] your knees. Keep the load close to your body. Avoid twisting. Exercise regularly as told by your health care provider. Exercising helps your back heal faster and helps prevent back injuries by keeping muscles strong and flexible. Work with a physical therapist to make a safe exercise program, as recommended by your health care provider. Do any exercises as told by your physical therapist. Lifestyle Maintain a healthy weight. Extra weight puts  stress on your back and makes it difficult to have good posture. Avoid activities or situations that make you feel anxious or stressed. Stress and anxiety increase muscle tension and can make back pain worse. Learn ways to manage anxiety and stress, such as through exercise. General instructions Sleep on a firm mattress in a comfortable position. Try lying on your side with your knees slightly bent. If you lie on your back, put a pillow under your knees. Keep your head and neck in a straight line with your spine (neutral position) when using electronic equipment like smartphones or pads. To do this: Raise your smartphone or pad to look at it instead of bending your head or neck to look down. Put the smartphone or pad at the level of your face while looking at the screen. Follow your treatment plan as told by your health care provider. This may include: Cognitive or behavioral therapy. Acupuncture or massage therapy. Meditation or yoga. Contact a health care provider if: You have pain that is not relieved with rest or medicine. You have increasing pain going down into your legs or buttocks. Your pain does not improve after 2 weeks. You have pain at night. You lose weight without trying. You have a fever or chills. You develop nausea or vomiting. You develop abdominal pain. Get help right away if: You develop new bowel or bladder control problems. You have unusual weakness or numbness in your arms or legs. You feel faint. These symptoms may represent a serious problem that is an emergency. Do not wait to see if the symptoms will go away. Get medical help right away. Call your local emergency services (911 in the [place] Do not drive yourself to the hospital. Summary Acute back pain is sudden and usually short-lived. Use proper lifting techniques. When you bend and lift, use positions that put less stress on your back. Take over-the-counter and prescription medicines only as told by your  health care provider, and apply heat or ice as told. This information is not intended to replace advice given to you by your health care provider. Make sure you discuss any questions you have with your health care provider. Document Revised: 02/06/2021 Document Reviewed: 02/06/2021 Elsevier Patient Education  2022 [place].   Constipation, Adult Constipation is when a person has fewer than three bowel movements in a week, has difficulty having a bowel movement, or has stools (feces) that are dry, hard, or larger than normal. Constipation may be caused by an underlying condition. It may become worse with age if a person takes certain medicines and does not take in enough fluids. Follow these instructions at home: Eating and drinking  Eat foods that have a lot of fiber, such as beans, whole grains, and fresh fruits and vegetables. Limit foods that are low in fiber and high in fat and processed sugars, such as fried or sweet foods. These include french fries, hamburgers, cookies,  candies, and soda. Drink enough fluid to keep your urine pale yellow. General instructions Exercise regularly or as told by your health care provider. Try to do 150 minutes of moderate exercise each week. Use the bathroom when you have the urge to go. Do not hold it in. Take over-the-counter and prescription medicines only as told by your health care provider. This includes any fiber supplements. During bowel movements: Practice deep breathing while relaxing the lower abdomen. Practice pelvic floor relaxation. Watch your condition for any changes. Let your health care provider know about them. Keep all follow-up visits as told by your health care provider. This is important. Contact a health care provider if: You have pain that gets worse. You have a fever. You do not have a bowel movement after 4 days. You vomit. You are not hungry or you lose weight. You are bleeding from the opening between the buttocks  (anus). You have thin, pencil-like stools. Get help right away if: You have a fever and your symptoms suddenly get worse. You leak stool or have blood in your stool. Your abdomen is bloated. You have severe pain in your abdomen. You feel dizzy or you faint. Summary Constipation is when a person has fewer than three bowel movements in a week, has difficulty having a bowel movement, or has stools (feces) that are dry, hard, or larger than normal. Eat foods that have a lot of fiber, such as beans, whole grains, and fresh fruits and vegetables. Drink enough fluid to keep your urine pale yellow. Take over-the-counter and prescription medicines only as told by your health care provider. This includes any fiber supplements. This information is not intended to replace advice given to you by your health care provider. Make sure you discuss any questions you have with your health care provider. Document Revised: 10/03/2019 Document Reviewed: 10/03/2019 Elsevier Patient Education  [place].

## 2021-09-22 LAB — COMPREHENSIVE METABOLIC PANEL
ALT: 15 U/L (ref 0–53)
AST: 20 U/L (ref 0–37)
Albumin: 4.2 g/dL (ref 3.5–5.2)
Alkaline Phosphatase: 58 U/L (ref 39–117)
BUN: 18 mg/dL (ref 6–23)
CO2: 26 mEq/L (ref 19–32)
Calcium: 9.1 mg/dL (ref 8.4–10.5)
Chloride: 100 mEq/L (ref 96–112)
Creatinine, Ser: 1.37 mg/dL (ref 0.40–1.50)
GFR: 51.11 mL/min — ABNORMAL LOW (ref >60.00)
Glucose, Bld: 78 mg/dL (ref 70–99)
Potassium: 4.1 mEq/L (ref 3.5–5.1)
Sodium: 134 mEq/L — ABNORMAL LOW (ref 135–145)
Total Bilirubin: 0.6 mg/dL (ref 0.2–1.2)
Total Protein: 7 g/dL (ref 6.0–8.3)

## 2021-09-24 ENCOUNTER — Telehealth: Payer: Self-pay | Admitting: *Deleted

## 2021-09-24 NOTE — Telephone Encounter (Signed)
Labs received from: [facility] on:09/18/2021 Reviewed by:Hazel Sams, PA-C  Labs drawn:CMP, CBC w/DIFF, Phosphorus, Urinaysis,PR/CR Ratio  Results: Creatine  1.39 GFR  54 Hgb  11.9 Hct  35.2 MCV  67 MCH  22.7 RDW  15.7

## 2021-09-25 ENCOUNTER — Telehealth: Payer: Self-pay | Admitting: Pharmacist

## 2021-09-25 NOTE — Telephone Encounter (Signed)
Noted, closing encounter.

## 2021-09-25 NOTE — Telephone Encounter (Addendum)
Dr. Estanislado Pandy ordered one-time Reclast in 2021. Will clarify that plan is for patient to receive another dose based on most recent labs  ----- Message from Shona Needles, RT sent at 09/24/2021  1:31 PM EDT ----- Regarding: RECLAST Patients last Reclast was 03/2020. Please check on status of next Reclast. Thank you.

## 2021-09-25 NOTE — Telephone Encounter (Signed)
This patient received IV Reclast for Paget's disease.  He does not need repeat dose.

## 2021-11-19 NOTE — Progress Notes (Signed)
Office Visit Note  [name]             Date of Birth: Jun 14, 1946           MRN: [id number]             PCP: [name], MD Referring: [name], MD Visit Date: 12/03/2021 Occupation: @GUAROCC @  Subjective:  Follow-up on Paget's disease.   History of Present Illness: Jeffrey Perry is a 75 y.o. male with a history of Paget's disease.  He denies any lower back pain or sacral pain.  He states he has been doing well and denies any discomfort.  He denies any morning stiffness.  He has been followed by Dr. Candie Chroman for chronic kidney disease.  Activities of Daily Living:  Patient reports morning stiffness for 0 minutes.   Patient Denies nocturnal pain.  Difficulty dressing/grooming: Denies Difficulty climbing stairs: Denies Difficulty getting out of chair: Denies Difficulty using hands for taps, buttons, cutlery, and/or writing: Denies  Review of Systems  Constitutional:  Negative for fatigue.  HENT:  Negative for mouth sores, mouth dryness and nose dryness.   Eyes:  Positive for dryness. Negative for pain and itching.  Respiratory:  Negative for shortness of breath and difficulty breathing.   Cardiovascular:  Negative for chest pain and palpitations.  Gastrointestinal:  Positive for constipation. Negative for blood in stool and diarrhea.  Endocrine: Negative for increased urination.  Genitourinary:  Negative for difficulty urinating.  Musculoskeletal:  Negative for joint pain, joint pain, joint swelling, myalgias, morning stiffness, muscle tenderness and myalgias.  Skin:  Negative for color change, rash and redness.  Allergic/Immunologic: Negative for susceptible to infections.  Neurological:  Positive for numbness. Negative for dizziness, headaches, memory loss and weakness.  Hematological:  Negative for bruising/bleeding tendency.  Psychiatric/Behavioral:  Negative for confusion.    PMFS History:  Patient Active Problem List   Diagnosis Date Noted    Tricuspid valve insufficiency 09/25/2020   Palpitations 09/25/2020   CKD (chronic kidney disease), stage II 09/25/2020   Murmur 07/18/2017   Jaundice 05/03/2016   Elevated LFTs    Abdominal pain 04/30/2016   Renal insufficiency 03/05/2013   HTN (hypertension) 03/06/2012   Prostate cancer ([place]) 10/04/2011    Past Medical History:  Diagnosis Date   Hypertension    Paget disease of bone    Questionable diagnosis.    Prostate cancer ([place])     Family History  Problem Relation Age of Onset   Hypertension Sister    Hypertension Brother    Hypertension Mother    Hypertension Brother    Stroke Sister    Hypertension Sister    Colon cancer Neg Hx    Past Surgical History:  Procedure Laterality Date   BLADDER SURGERY     FINGER GANGLION CYST EXCISION  2011   3rd finger right hand   HYDROCELE EXCISION  2012   PENILE PROSTHESIS IMPLANT     TRIGGER FINGER RELEASE Left 08/2021   thumb   Social History   Social History Narrative   Not on file   Immunization History  Administered Date(s) Administered   DTaP 03/22/2008   Gamma Globulin 06/08/1994   H1N1 10/03/2004   Hepatitis A 03/29/1996, 06/10/1998   Hepatitis B 03/22/2008, 10/07/2008   IPV 03/18/2003   Meningococcal Conjugate 03/22/2008   PFIZER(Purple Top)SARS-COV-2 Vaccination 01/20/2020, 02/12/2020, 07/30/2020   Pneumococcal Conjugate-13 05/02/2010, 01/31/2019   Pneumococcal Polysaccharide-23 03/04/2014   Pneumococcal-Unspecified 05/02/2010   Tdap 03/22/2008, 03/24/2018  Typhoid Parenteral 05/29/2002   Yellow Fever 03/22/2008   Zoster, Live 03/29/2010     Objective: Vital Signs: BP 127/77 (BP Location: Left Arm, Patient Position: Sitting, Cuff Size: Large)    Pulse 60    Ht 6\' 1"  (1.854 m)    Wt 241 lb 9.6 oz (109.6 kg)    BMI 31.88 kg/m    Physical Exam Vitals and nursing note reviewed.  Constitutional:      Appearance: He is well-developed.  HENT:     Head: Normocephalic and atraumatic.  Eyes:      Conjunctiva/sclera: Conjunctivae normal.     Pupils: Pupils are equal, round, and reactive to light.  Cardiovascular:     Rate and Rhythm: Normal rate and regular rhythm.     Heart sounds: Normal heart sounds.  Pulmonary:     Effort: Pulmonary effort is normal.     Breath sounds: Normal breath sounds.  Abdominal:     General: Bowel sounds are normal.     Palpations: Abdomen is soft.  Musculoskeletal:     Cervical back: Normal range of motion and neck supple.  Skin:    General: Skin is warm and dry.     Capillary Refill: Capillary refill takes less than 2 seconds.  Neurological:     Mental Status: He is alert and oriented to person, place, and time.  Psychiatric:        Behavior: Behavior normal.     Musculoskeletal Exam: C-spine thoracic and lumbar spine with good range of motion.  Shoulder joints, elbow joints, wrist joints, MCPs PIPs and DIPs with good range of motion with no synovitis.  Hip joints, knee joints, ankles, MTPs and PIPs with good range of motion with no synovitis.  CDAI Exam: CDAI Score: -- Patient Global: --; Provider Global: -- Swollen: --; Tender: -- Joint Exam 12/03/2021   No joint exam has been documented for this visit   There is currently no information documented on the homunculus. Go to the Rheumatology activity and complete the homunculus joint exam.  Investigation: No additional findings.  Imaging: No results found.  Recent Labs: Lab Results  Component Value Date   WBC 4.5 03/23/2021   HGB 12.1 (L) 03/23/2021   PLT 159.0 03/23/2021   NA 136 11/26/2021   K 3.9 11/26/2021   CL 101 11/26/2021   CO2 27 11/26/2021   GLUCOSE 126 (H) 11/26/2021   BUN 19 11/26/2021   CREATININE 1.27 11/26/2021   BILITOT 0.7 11/26/2021   ALKPHOS 58 09/21/2021   AST 20 11/26/2021   ALT 16 11/26/2021   PROT 6.8 11/26/2021   ALBUMIN 4.2 09/21/2021   CALCIUM 8.8 11/26/2021   GFRAA 60 06/04/2021    Speciality Comments: No specialty comments  available.  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  Patient received Reclast 5 mg IV on May 09, 2020.  He continues to do well without any joint pain or discomfort.  He had no tenderness on palpation over sacrum or pelvic region.  Elevated alkaline phosphatase level-alkaline phosphatase states levels have been consistently within normal limits since the Reclast infusion.  I advised him to get CMP in 6 months.  Stage IIIa chronic kidney disease-has been followed by Dr. Marval Regal and GFR has been stable.  Trigger thumb, left thumb -resolved after surgery.  Prostate  cancer ([place]) - no recurrence per patient.  Essential hypertension-blood pressure is normal today.  Orders: Orders Placed This Encounter  Procedures   COMPLETE METABOLIC PANEL WITH GFR   No orders of the defined types were placed in this encounter.   Follow-Up Instructions: Return for paget's .   Bo Merino, MD  Note - This record has been created using Editor, commissioning.  Chart creation errors have been sought, but may not always  have been located. Such creation errors do not reflect on  the standard of medical care.

## 2021-11-26 ENCOUNTER — Other Ambulatory Visit: Payer: Self-pay | Admitting: *Deleted

## 2021-11-26 DIAGNOSIS — R748 Abnormal levels of other serum enzymes: Secondary | ICD-10-CM

## 2021-11-27 LAB — COMPLETE METABOLIC PANEL WITH GFR
AG Ratio: 1.3 (calc) (ref 1.0–2.5)
ALT: 16 U/L (ref 9–46)
AST: 20 U/L (ref 10–35)
Albumin: 3.9 g/dL (ref 3.6–5.1)
Alkaline phosphatase (APISO): 63 U/L (ref 35–144)
BUN: 19 mg/dL (ref 7–25)
CO2: 27 mmol/L (ref 20–32)
Calcium: 8.8 mg/dL (ref 8.6–10.3)
Chloride: 101 mmol/L (ref 98–110)
Creat: 1.27 mg/dL (ref 0.70–1.28)
Globulin: 2.9 g/dL (calc) (ref 1.9–3.7)
Glucose, Bld: 126 mg/dL — ABNORMAL HIGH (ref 65–99)
Potassium: 3.9 mmol/L (ref 3.5–5.3)
Sodium: 136 mmol/L (ref 135–146)
Total Bilirubin: 0.7 mg/dL (ref 0.2–1.2)
Total Protein: 6.8 g/dL (ref 6.1–8.1)
eGFR: 60 mL/min/{1.73_m2} (ref >=60)

## 2021-11-27 NOTE — Progress Notes (Signed)
Creatinine is normal now and glucose is mildly elevated.

## 2021-12-03 ENCOUNTER — Ambulatory Visit: Payer: Medicare PPO | Admitting: Rheumatology

## 2021-12-03 ENCOUNTER — Encounter: Payer: Self-pay | Admitting: Rheumatology

## 2021-12-03 ENCOUNTER — Other Ambulatory Visit: Payer: Self-pay

## 2021-12-03 VITALS — BP 127/77 | HR 60 | Ht 73.0 in | Wt 241.6 lb

## 2021-12-03 DIAGNOSIS — I10 Essential (primary) hypertension: Secondary | ICD-10-CM

## 2021-12-03 DIAGNOSIS — N1831 Chronic kidney disease, stage 3a: Secondary | ICD-10-CM | POA: Diagnosis not present

## 2021-12-03 DIAGNOSIS — M889 Osteitis deformans of unspecified bone: Secondary | ICD-10-CM

## 2021-12-03 DIAGNOSIS — R748 Abnormal levels of other serum enzymes: Secondary | ICD-10-CM | POA: Diagnosis not present

## 2021-12-03 DIAGNOSIS — C61 Malignant neoplasm of prostate: Secondary | ICD-10-CM

## 2021-12-03 NOTE — Patient Instructions (Signed)
Please come 1 week prior to your next appointment for lab work which will be CMP with GFR

## 2022-01-07 ENCOUNTER — Other Ambulatory Visit: Payer: Self-pay | Admitting: Family Medicine

## 2022-01-07 DIAGNOSIS — E876 Hypokalemia: Secondary | ICD-10-CM

## 2022-02-04 ENCOUNTER — Telehealth: Payer: Self-pay

## 2022-02-04 ENCOUNTER — Ambulatory Visit: Payer: Medicare PPO

## 2022-02-04 NOTE — Telephone Encounter (Signed)
I left a message on patient's voice mail for him to call me back to reschedule his AWV. ?

## 2022-02-04 NOTE — Telephone Encounter (Signed)
Called patient he answered and stated he is on the other line and cannot talk to reschedule appointment . ? ?L.Jeffrey Willert,LPN  ?

## 2022-02-04 NOTE — Progress Notes (Signed)
error 

## 2022-02-06 ENCOUNTER — Other Ambulatory Visit: Payer: Self-pay | Admitting: Family Medicine

## 2022-02-06 DIAGNOSIS — I10 Essential (primary) hypertension: Secondary | ICD-10-CM

## 2022-02-08 NOTE — Telephone Encounter (Signed)
I spoke to patient and he rescheduled appointment to 02/18/22. ?

## 2022-02-18 ENCOUNTER — Ambulatory Visit: Payer: Medicare PPO

## 2022-02-25 ENCOUNTER — Encounter: Payer: Self-pay | Admitting: Cardiology

## 2022-02-25 ENCOUNTER — Encounter (INDEPENDENT_AMBULATORY_CARE_PROVIDER_SITE_OTHER): Payer: Medicare PPO | Admitting: Ophthalmology

## 2022-02-25 DIAGNOSIS — H318 Other specified disorders of choroid: Secondary | ICD-10-CM

## 2022-02-25 DIAGNOSIS — I10 Essential (primary) hypertension: Secondary | ICD-10-CM

## 2022-02-25 DIAGNOSIS — H43813 Vitreous degeneration, bilateral: Secondary | ICD-10-CM | POA: Diagnosis not present

## 2022-02-25 DIAGNOSIS — H35033 Hypertensive retinopathy, bilateral: Secondary | ICD-10-CM | POA: Diagnosis not present

## 2022-02-25 NOTE — Progress Notes (Signed)
?  ?Cardiology Office Note ? ? ?Date:  02/26/2022  ? ?IDJahsir Perry, DOB 05/09/1946, MRN [id number] ? ?PCP:  [name], MD  ?Cardiologist:   Minus Breeding, MD  ? ?Chief Complaint  ?Patient presents with  ? Palpitations  ? ? ?  ?History of Present Illness: ?Jeffrey Perry is a 76 y.o. male who presents for evaluation of HTN and a heart murmur.   He was found to have TR.  I last saw him in 2021.    ? ?Since I last saw him he has done well.  He has not Apple Watch.  He does not get alerts on this.  He does not really notice much in the way of palpitations, presyncope or syncope.  He has no new shortness of breath, PND or orthopnea.  He has had no chest pressure, neck or arm discomfort.  He was traveling in [place] and was not exercising as much but now he is back and started walking. ? ? ?Past Medical History:  ?Diagnosis Date  ? Hypertension   ? Paget disease of bone   ? Questionable diagnosis.   ? Prostate cancer ([place])   ? Tricuspid regurgitation   ? ? ?Past Surgical History:  ?Procedure Laterality Date  ? BLADDER SURGERY    ? FINGER GANGLION CYST EXCISION  2011  ? 3rd finger right hand  ? HYDROCELE EXCISION  2012  ? PENILE PROSTHESIS IMPLANT    ? TRIGGER FINGER RELEASE Left 08/2021  ? thumb  ? ? ? ?Current Outpatient Medications  ?Medication Sig Dispense Refill  ? alfuzosin (UROXATRAL) 10 MG 24 hr tablet TAKE ONE TABLET BY MOUTH EVERY MORNING WITH BREAKFAST 90 tablet 1  ? bevacizumab (AVASTIN) 2.5 mg/0.1 mL SOLN intravitreal injection 0.625 mg by Intravitreal route once.    ? diltiazem (TIAZAC) 180 MG 24 hr capsule Take 1 capsule (180 mg total) by mouth at bedtime. 90 capsule 1  ? losartan-hydrochlorothiazide (HYZAAR) 100-25 MG tablet TAKE ONE TABLET BY MOUTH DAILY 90 tablet 1  ? potassium chloride (KLOR-CON M) 10 MEQ tablet TAKE ONE TABLET BY MOUTH TWICE A DAY 60 tablet 2  ? Vitamin D, Ergocalciferol, 50000 units CAPS Take 50,000 Units by mouth every 30 (thirty) days.    ? ?No current facility-administered  medications for this visit.  ? ? ?Allergies:   Latex, Lisinopril, Neuromuscular blocking agents, Phenazopyridine hcl, Pyridium [phenazopyridine hcl], and Sulfonamide derivatives  ? ? ?ROS:  Please see the history of present illness.   Otherwise, review of systems are positive for none.   All other systems are reviewed and negative.  ? ? ?PHYSICAL EXAM: ?VS:  BP 120/60 (BP Location: Left Arm)   Pulse (!) 54   Ht '6\' 1"'$  (1.854 m)   Wt 241 lb 3.2 oz (109.4 kg)   SpO2 95%   BMI 31.82 kg/m?  , BMI Body mass index is 31.82 kg/m?.  ?GENERAL:  Well appearing ?NECK:  No jugular venous distention, waveform within normal limits, carotid upstroke brisk and symmetric, no bruits, no thyromegaly ?LUNGS:  Clear to auscultation bilaterally ?CHEST:  Unremarkable ?HEART:  PMI not displaced or sustained,S1 and S2 within normal limits, no S3, no S4, no clicks, no rubs, no murmurs ?ABD:  Flat, positive bowel sounds normal in frequency in pitch, no bruits, no rebound, no guarding, no midline pulsatile mass, no hepatomegaly, no splenomegaly ?EXT:  2 plus pulses throughout, no edema, no cyanosis no clubbing ? ? ? ?EKG:  EKG is  ordered today. ?  Sinus rhythm, rate 54, left anterior fascicular block, left ventricular hypertrophy, no acute T-wave changes.  No change from previous ? ? ?Recent Labs: ?03/23/2021: Hemoglobin 12.1; Platelets 159.0 ?11/26/2021: ALT 16; BUN 19; Creat 1.27; Potassium 3.9; Sodium 136  ? ? ?Lipid Panel ?   ?Component Value Date/Time  ? CHOL 151 02/21/2017 1131  ? TRIG 129 02/21/2017 1131  ? HDL 34 (L) 02/21/2017 1131  ? CHOLHDL 4.4 02/21/2017 1131  ? CHOLHDL 3.8 07/22/2016 0842  ? VLDL 16 07/22/2016 0842  ? Leal 91 02/21/2017 1131  ? ?  ? ?Wt Readings from Last 3 Encounters:  ?02/26/22 241 lb 3.2 oz (109.4 kg)  ?12/03/21 241 lb 9.6 oz (109.6 kg)  ?09/21/21 243 lb 9.6 oz (110.5 kg)  ?  ? ? ?Other studies Reviewed: ?Additional studies/ records that were reviewed today include: Labs ?Review of the above records  demonstrates:  See elsewhere ? ? ?ASSESSMENT AND PLAN: ? ?TR:    I will check an echocardiogram to follow-up on this.  I do not strongly suspect that he has pulmonary hypertension or if this is worsening.\ ? ?ARRHYTHMIA:   There were some questions of atrial fibrillation at a previous visit but there is been no documentation of this and he has his Apple watch.  No change in therapy. ? ?HTN:  BP is controlled.  No change in therapy. ? ?CKD II:   Creat is mildly elevated and he is following up with pulmonary.  ? ?AORTIC ENLARGEMENT: He had minimal aortic enlargement noted on echocardiogram previously.  I will be following this up with the echo.  I do not think CT is indicated at this point. ? ?Current medicines are reviewed at length with the patient today.  The patient does not have concerns regarding medicines. ? ?The following changes have been made:   None ? ?Labs/ tests ordered today include:   ? ?Orders Placed This Encounter  ?Procedures  ? EKG 12-Lead  ? ECHOCARDIOGRAM COMPLETE  ? ? ? ?Disposition:   FU with me in 1 year or so. ? ? ? ?Signed, ?Minus Breeding, MD  ?02/26/2022 11:28 AM    ?[place] ?

## 2022-02-26 ENCOUNTER — Encounter: Payer: Self-pay | Admitting: Cardiology

## 2022-02-26 ENCOUNTER — Ambulatory Visit: Payer: Medicare PPO | Admitting: Cardiology

## 2022-02-26 VITALS — BP 120/60 | HR 54 | Ht 73.0 in | Wt 241.2 lb

## 2022-02-26 DIAGNOSIS — I071 Rheumatic tricuspid insufficiency: Secondary | ICD-10-CM | POA: Diagnosis not present

## 2022-02-26 DIAGNOSIS — N182 Chronic kidney disease, stage 2 (mild): Secondary | ICD-10-CM

## 2022-02-26 DIAGNOSIS — I10 Essential (primary) hypertension: Secondary | ICD-10-CM | POA: Diagnosis not present

## 2022-02-26 NOTE — Patient Instructions (Addendum)
Medication Instructions:  ?No changes ?*If you need a refill on your cardiac medications before your next appointment, please call your pharmacy* ? ? ?Lab Work: ?Please have an A1C drawn with your next labs ? ?If you have labs (blood work) drawn today and your tests are completely normal, you will receive your results only by: ?MyChart Message (if you have MyChart) OR ?A paper copy in the mail ?If you have any lab test that is abnormal or we need to change your treatment, we will call you to review the results. ? ? ?Testing/Procedures: ?Your physician has requested that you have an echocardiogram. Echocardiography is a painless test that uses sound waves to create images of your heart. It provides your doctor with information about the size and shape of your heart and how well your heart?s chambers and valves are working. You may receive an ultrasound enhancing agent through an IV if needed to better visualize your heart during the echo.This procedure takes approximately one hour. There are no restrictions for this procedure. This will take place at the [address], [address].  ? ?Follow-Up: ?At [facility], you and your health needs are our priority.  As part of our continuing mission to provide you with exceptional heart care, we have created designated Provider Care Teams.  These Care Teams include your primary Cardiologist (physician) and Advanced Practice Providers (APPs -  Physician Assistants and Nurse Practitioners) who all work together to provide you with the care you need, when you need it. ? ?We recommend signing up for the patient portal called "MyChart".  Sign up information is provided on this After Visit Summary.  MyChart is used to connect with patients for Virtual Visits (Telemedicine).  Patients are able to view lab/test results, encounter notes, upcoming appointments, etc.  Non-urgent messages can be sent to your provider as well.   ?To learn more about what you can do with MyChart, go  to [email or web address].   ? ?Your next appointment:   ?12 month(s) ? ?The format for your next appointment:   ?In Person ? ?Provider:   ?Minus Breeding, MD { ? ?

## 2022-03-18 ENCOUNTER — Ambulatory Visit (HOSPITAL_COMMUNITY): Payer: Medicare PPO | Attending: Cardiology

## 2022-03-18 DIAGNOSIS — I361 Nonrheumatic tricuspid (valve) insufficiency: Secondary | ICD-10-CM

## 2022-03-18 DIAGNOSIS — I071 Rheumatic tricuspid insufficiency: Secondary | ICD-10-CM | POA: Insufficient documentation

## 2022-03-18 LAB — ECHOCARDIOGRAM COMPLETE
Area-P 1/2: 4.15 cm2
S' Lateral: 3.1 cm

## 2022-03-22 ENCOUNTER — Ambulatory Visit: Payer: Medicare PPO | Admitting: Family Medicine

## 2022-03-22 ENCOUNTER — Encounter: Payer: Self-pay | Admitting: Family Medicine

## 2022-03-22 VITALS — BP 130/78 | HR 61 | Temp 98.2°F | Resp 17 | Ht 73.0 in | Wt 242.8 lb

## 2022-03-22 DIAGNOSIS — I10 Essential (primary) hypertension: Secondary | ICD-10-CM | POA: Diagnosis not present

## 2022-03-22 DIAGNOSIS — R739 Hyperglycemia, unspecified: Secondary | ICD-10-CM | POA: Diagnosis not present

## 2022-03-22 DIAGNOSIS — E1159 Type 2 diabetes mellitus with other circulatory complications: Secondary | ICD-10-CM

## 2022-03-22 DIAGNOSIS — E1122 Type 2 diabetes mellitus with diabetic chronic kidney disease: Secondary | ICD-10-CM | POA: Diagnosis not present

## 2022-03-22 DIAGNOSIS — N182 Chronic kidney disease, stage 2 (mild): Secondary | ICD-10-CM

## 2022-03-22 LAB — BASIC METABOLIC PANEL
BUN: 21 mg/dL (ref 6–23)
CO2: 27 mEq/L (ref 19–32)
Calcium: 9.1 mg/dL (ref 8.4–10.5)
Chloride: 102 mEq/L (ref 96–112)
Creatinine, Ser: 1.32 mg/dL (ref 0.40–1.50)
GFR: 53.26 mL/min — ABNORMAL LOW (ref >60.00)
Glucose, Bld: 79 mg/dL (ref 70–99)
Potassium: 3.8 mEq/L (ref 3.5–5.1)
Sodium: 137 mEq/L (ref 135–145)

## 2022-03-22 LAB — HEMOGLOBIN A1C: Hgb A1c MFr Bld: 6.2 % (ref 4.6–6.5)

## 2022-03-22 NOTE — Progress Notes (Signed)
? ?Subjective:  ?Patient ID: Jeffrey Perry, male    DOB: [birth year]  Age: 76 y.o. MRN: [id number] ? ?CC:  ?Chief Complaint  ?Patient presents with  ? Hypertension  ?  Pt reports over last 3 weeks has had some higher BP notes last 2 days has started coming down last night 130-/76 denies physical sxs   ? ? ?HPI ?Jeffrey Perry presents for  ? ?Hypertension: ?With CKD. Elevated readings past few weeks. Up to 165/96. No HA, fatigue, vision changes or new symptoms. BP was initially high at eye specialist, then elevated at home.  ?Better last night 130/76.  ?Less walking than in past. No added salt to diet.  ?Some increased food from restaurants past 2 weeks- water damage in home.  ?No missed doses of diltiazem or losartan hct.  ?Cardiology visit 02/26/22. No changes. Recheck 1 year.  ? ?BP Readings from Last 3 Encounters:  ?03/22/22 130/78  ?02/26/22 120/60  ?12/03/21 127/77  ? ?Lab Results  ?Component Value Date  ? CREATININE 1.27 11/26/2021  ? ?Hyperglycemia ?Glucose 126, 78, 68, 110 over past year.  ?Lab Results  ?Component Value Date  ? HGBA1C 6.0 (H) 04/30/2016  ?Ate 4.5 hrs ago.  ? ?History ?Patient Active Problem List  ? Diagnosis Date Noted  ? Tricuspid valve insufficiency 09/25/2020  ? Palpitations 09/25/2020  ? CKD (chronic kidney disease), stage II 09/25/2020  ? Murmur 07/18/2017  ? Jaundice 05/03/2016  ? Elevated LFTs   ? Abdominal pain 04/30/2016  ? Renal insufficiency 03/05/2013  ? HTN (hypertension) 03/06/2012  ? Prostate cancer ([place]) 10/04/2011  ? ?Past Medical History:  ?Diagnosis Date  ? Hypertension   ? Paget disease of bone   ? Questionable diagnosis.   ? Prostate cancer ([place])   ? Tricuspid regurgitation   ? ?Past Surgical History:  ?Procedure Laterality Date  ? BLADDER SURGERY    ? FINGER GANGLION CYST EXCISION  2011  ? 3rd finger right hand  ? HYDROCELE EXCISION  2012  ? PENILE PROSTHESIS IMPLANT    ? TRIGGER FINGER RELEASE Left 08/2021  ? thumb  ? ?Allergies  ?Allergen Reactions  ? Latex Itching  ?  Lisinopril Cough  ? Neuromuscular Blocking Agents Swelling  ?  Ankle swelling ?Ankle swelling  ? Phenazopyridine Hcl Swelling  ?  Ankle swelling  ? Pyridium [Phenazopyridine Hcl] Swelling  ?  Ankle swelling  ? Sulfonamide Derivatives Itching  ? ?Prior to Admission medications   ?Medication Sig Start Date End Date Taking? Authorizing Provider  ?alfuzosin (UROXATRAL) 10 MG 24 hr tablet TAKE ONE TABLET BY MOUTH EVERY MORNING WITH BREAKFAST 09/21/21  Yes [name], MD  ?bevacizumab (AVASTIN) 2.5 mg/0.1 mL SOLN intravitreal injection 0.625 mg by Intravitreal route once.   Yes [provider]  ?diltiazem (TIAZAC) 180 MG 24 hr capsule Take 1 capsule (180 mg total) by mouth at bedtime. 09/21/21  Yes [name], MD  ?losartan-hydrochlorothiazide [place]) 100-25 MG tablet TAKE ONE TABLET BY MOUTH DAILY 02/08/22  Yes [name], MD  ?potassium chloride (KLOR-CON M) 10 MEQ tablet TAKE ONE TABLET BY MOUTH TWICE A DAY 01/07/22  Yes [name], MD  ?Vitamin D, Ergocalciferol, 50000 units CAPS Take 50,000 Units by mouth every 30 (thirty) days. ?Patient not taking: Reported on 03/22/2022    [provider]  ? ?Social History  ? ?Socioeconomic History  ? Marital status: Married  ?  Spouse name: Not on file  ? Number of children: Not on file  ?  Years of education: Not on file  ? Highest education level: Not on file  ?Occupational History  ? Occupation: professor  ?  Employer: [employer]  ?Tobacco Use  ? Smoking status: Never  ? Smokeless tobacco: Never  ?Vaping Use  ? Vaping Use: Never used  ?Substance and Sexual Activity  ? Alcohol use: Yes  ?  Comment: Beer occ  ? Drug use: No  ? Sexual activity: Yes  ?  Partners: Female  ?Other Topics Concern  ? Not on file  ?Social History Narrative  ? Not on file  ? ?Social Determinants of Health  ? ?Financial Resource Strain: Not on file  ?Food Insecurity: Not on file  ?Transportation Needs: Not on file  ?Physical Activity: Not on file  ?Stress: Not on file   ?Social Connections: Not on file  ?Intimate Partner Violence: Not on file  ? ? ?Review of Systems  ?Constitutional:  Negative for fatigue and unexpected weight change.  ?Eyes:  Negative for visual disturbance.  ?Respiratory:  Negative for cough, chest tightness and shortness of breath.   ?Cardiovascular:  Negative for chest pain, palpitations and leg swelling.  ?Gastrointestinal:  Negative for abdominal pain and blood in stool.  ?Neurological:  Negative for dizziness, light-headedness and headaches.  ? ? ?Objective:  ? ?Vitals:  ? 03/22/22 1321  ?BP: 130/78  ?Pulse: 61  ?Resp: 17  ?Temp: 98.2 ?F (36.8 ?C)  ?TempSrc: Temporal  ?SpO2: 100%  ?Weight: 242 lb 12.8 oz (110.1 kg)  ?Height: '6\' 1"'$  (1.854 m)  ? ? ?Physical Exam ?Vitals reviewed.  ?Constitutional:   ?   Appearance: He is well-developed.  ?HENT:  ?   Head: Normocephalic and atraumatic.  ?Neck:  ?   Vascular: No carotid bruit or JVD.  ?Cardiovascular:  ?   Rate and Rhythm: Normal rate and regular rhythm.  ?   Heart sounds: Normal heart sounds. No murmur heard. ?Pulmonary:  ?   Effort: Pulmonary effort is normal.  ?   Breath sounds: Normal breath sounds. No rales.  ?Musculoskeletal:  ?   Right lower leg: No edema.  ?   Left lower leg: No edema.  ?Skin: ?   General: Skin is warm and dry.  ?Neurological:  ?   Mental Status: He is alert and oriented to person, place, and time.  ?Psychiatric:     ?   Mood and Affect: Mood normal.  ? ? ? ? ? ?Assessment & Plan:  ?Jeffrey Perry is a 76 y.o. male . ?Hypertension associated with diabetes ([place]) - Plan: Basic metabolic panel ? -Recent decreased control, likely related to decreased activity and possibly increased sodium with restaurant food.  Improved reading today.  Discussed possible dosage changes but with borderline bradycardia I am concerned he may not tolerate higher dose of diltiazem.  Home monitoring recommended next 2 weeks, avoid restaurant food or lower sodium options if needed.  Handout given on salty 6.  MyChart  message regarding home readings next 2 weeks to decide on changes.  1 month follow-up. ? ?CKD (chronic kidney disease), stage II - Plan: Basic metabolic panel ? -Continue routine follow-up with nephrology.  Check labs.  Eval for changes with recent elevated blood pressures. ? ?Hyperglycemia - Plan: Hemoglobin A1c ? -No recent A1c, has had variable blood sugars, normal a few times last year and most recently elevated slightly.  May have been slight hyperglycemia without being fasting, but also suspect component of prediabetes based on A1c in 2017. ? ?No orders of the  defined types were placed in this encounter. ? ?Patient Instructions  ?Try cut back on restaurant food or low sodium entrees. See list of higher sodium foods, and try to avoid them. Walking most days per week. Keep a record of your blood pressures outside of the office and send me update in next 2 weeks. We could change meds if needed, but heart rate on lower side and higher dose diltiazem may not be tolerated.  ? ?Recheck 1 month.  ? ? ? ? ?Signed,  ? ?[name], MD ?[place], [facility] ?[facility] ?03/22/22 ?2:02 PM ? ? ?

## 2022-03-22 NOTE — Patient Instructions (Addendum)
Try cut back on restaurant food or low sodium entrees. See list of higher sodium foods, and try to avoid them. Walking most days per week. Keep a record of your blood pressures outside of the office and send me update in next 2 weeks. We could change meds if needed, but heart rate on lower side and higher dose diltiazem may not be tolerated.  ? ?Recheck 1 month.  ? ?

## 2022-03-25 ENCOUNTER — Encounter (INDEPENDENT_AMBULATORY_CARE_PROVIDER_SITE_OTHER): Payer: Medicare PPO | Admitting: Ophthalmology

## 2022-03-25 DIAGNOSIS — I10 Essential (primary) hypertension: Secondary | ICD-10-CM | POA: Diagnosis not present

## 2022-03-25 DIAGNOSIS — H318 Other specified disorders of choroid: Secondary | ICD-10-CM | POA: Diagnosis not present

## 2022-03-25 DIAGNOSIS — H2513 Age-related nuclear cataract, bilateral: Secondary | ICD-10-CM

## 2022-03-25 DIAGNOSIS — H35033 Hypertensive retinopathy, bilateral: Secondary | ICD-10-CM

## 2022-03-25 DIAGNOSIS — H43813 Vitreous degeneration, bilateral: Secondary | ICD-10-CM | POA: Diagnosis not present

## 2022-03-29 ENCOUNTER — Ambulatory Visit: Payer: Medicare PPO | Admitting: Dermatology

## 2022-04-05 ENCOUNTER — Ambulatory Visit: Payer: Medicare PPO | Admitting: Dermatology

## 2022-04-05 ENCOUNTER — Encounter: Payer: Self-pay | Admitting: Dermatology

## 2022-04-05 DIAGNOSIS — D489 Neoplasm of uncertain behavior, unspecified: Secondary | ICD-10-CM

## 2022-04-05 DIAGNOSIS — Z1283 Encounter for screening for malignant neoplasm of skin: Secondary | ICD-10-CM

## 2022-04-05 DIAGNOSIS — L820 Inflamed seborrheic keratosis: Secondary | ICD-10-CM | POA: Diagnosis not present

## 2022-04-05 DIAGNOSIS — L720 Epidermal cyst: Secondary | ICD-10-CM | POA: Diagnosis not present

## 2022-04-05 NOTE — Patient Instructions (Signed)
Seborrheic Keratosis A seborrheic keratosis is a common, noncancerous (benign) skin growth. These growths are velvety, waxy, rough, tan, brown, or black spots that appear on the skin. These skin growths can be flat or raised, and scaly. What are the causes? The cause of this condition is not known. What increases the risk? You are more likely to develop this condition if you: Have a family history of seborrheic keratosis. Are 50 or older. Are pregnant. Have had estrogen replacement therapy. What are the signs or symptoms? Symptoms of this condition include growths on the face, chest, shoulders, back, or other areas. These growths: Are usually painless, but may become irritated and itchy. Can be yellow, brown, black, or other colors. Are slightly raised or have a flat surface. Are sometimes rough or wart-like in texture. Are often velvety or waxy on the surface. Are round or oval-shaped. Often occur in groups, but may occur as a single growth. How is this diagnosed? This condition is diagnosed with a medical history and physical exam. A sample of the growth may be tested (skin biopsy). You may need to see a skin specialist (dermatologist). How is this treated? Treatment is not usually needed for this condition, unless the growths are irritated or bleed often. You may also choose to have the growths removed if you do not like their appearance. Most commonly, these growths are treated with a procedure in which liquid nitrogen is applied to "freeze" off the growth (cryosurgery). They may also be burned off with electricity (electrocautery) or removed by scraping (curettage). Follow these instructions at home: Watch your growth for any changes. Keep all follow-up visits as told by your health care provider. This is important. Do not scratch or pick at the growth or growths. This can cause them to become irritated or infected. Contact a health care provider if: You suddenly have many new  growths. Your growth bleeds, itches, or hurts. Your growth suddenly becomes larger or changes color. Summary A seborrheic keratosis is a common, noncancerous (benign) skin growth. Treatment is not usually needed for this condition, unless the growths are irritated or bleed often. Watch your growth for any changes. Contact a health care provider if you suddenly have many new growths or your growth suddenly becomes larger or changes color. Keep all follow-up visits as told by your health care provider. This is important. This information is not intended to replace advice given to you by your health care provider. Make sure you discuss any questions you have with your health care provider. Document Revised: 09/09/2021 Document Reviewed: 09/09/2021 Elsevier Patient Education  2023 [employer]  

## 2022-04-07 ENCOUNTER — Ambulatory Visit: Payer: Medicare PPO | Admitting: Dermatology

## 2022-04-07 ENCOUNTER — Other Ambulatory Visit: Payer: Self-pay | Admitting: Family Medicine

## 2022-04-07 DIAGNOSIS — E876 Hypokalemia: Secondary | ICD-10-CM

## 2022-04-07 DIAGNOSIS — I10 Essential (primary) hypertension: Secondary | ICD-10-CM

## 2022-04-08 ENCOUNTER — Ambulatory Visit: Payer: Medicare PPO | Admitting: Family Medicine

## 2022-04-19 ENCOUNTER — Ambulatory Visit (INDEPENDENT_AMBULATORY_CARE_PROVIDER_SITE_OTHER)
Admission: RE | Admit: 2022-04-19 | Discharge: 2022-04-19 | Disposition: A | Discharge status: Home / Self Care | Payer: Medicare PPO | Source: Ambulatory Visit | Attending: Family Medicine | Admitting: Family Medicine

## 2022-04-19 ENCOUNTER — Encounter: Payer: Self-pay | Admitting: Family Medicine

## 2022-04-19 ENCOUNTER — Ambulatory Visit: Payer: Medicare PPO | Admitting: Family Medicine

## 2022-04-19 VITALS — BP 128/76 | HR 63 | Temp 98.3°F | Resp 17 | Ht 73.0 in | Wt 242.4 lb

## 2022-04-19 DIAGNOSIS — I10 Essential (primary) hypertension: Secondary | ICD-10-CM | POA: Diagnosis not present

## 2022-04-19 DIAGNOSIS — M889 Osteitis deformans of unspecified bone: Secondary | ICD-10-CM

## 2022-04-19 DIAGNOSIS — M5442 Lumbago with sciatica, left side: Secondary | ICD-10-CM | POA: Diagnosis not present

## 2022-04-19 MED ORDER — PREDNISONE 20 MG PO TABS: ORAL_TABLET | ORAL | 0 refills | Status: DC

## 2022-04-19 MED ORDER — ALFUZOSIN HCL ER 10 MG PO TB24: ORAL_TABLET | ORAL | 1 refills | Status: DC

## 2022-04-19 MED ORDER — LOSARTAN POTASSIUM-HCTZ 100-25 MG PO TABS: 1.0000 | ORAL_TABLET | Freq: Every day | ORAL | 1 refills | Status: DC

## 2022-04-19 NOTE — Progress Notes (Signed)
Subjective:  Patient ID: Jeffrey Perry, male    DOB: Apr 04, 1946  Age: 76 y.o. MRN: [id number]  CC:  Chief Complaint  Patient presents with   Hypertension    Pt here for recheck on BP notes he is having normal readings again no concerns    Hip Pain    Pt reports hip pain from last visit has increased some and would like to discuss what else he can do for this     HPI Jeffrey Perry presents for   Hypertension: With CKD.  Last visit April 24.  Some decreased control discussed at that visit, likely related to decreased activity and possibly increased sodium.  Was improving at last visit.  We decided against medication changes given his borderline bradycardia and question of tolerance of higher dose of diltiazem.  Continued on same doses diltiazem, losartan HCT.  Followed by nephrology with CKD, Dr. Marval Regal.  Blood pressure better since last visit - 116-130/70's.   Home readings: BP Readings from Last 3 Encounters:  04/19/22 128/76  03/22/22 130/78  02/26/22 120/60   Lab Results  Component Value Date   CREATININE 1.32 03/22/2022   Hip pain History of Paget's, treated with Reclast in 2021 with rheumatology.  Appointment with rheumatology 12/03/2021 noted.  No discomfort at that time was doing well.  No back or sacral pain per last note.  Now reports pain in left hip/buttock area - long time, comes and goes.  Worse past 2 weeks. No fall/injury/new activities or exercise - just walking. Pain radiates down leg to foot. Trying stretching, min relief. No meds. Min improvement past days.  No bowel or bladder incontinence, no saddle anesthesia, no lower extremity weakness.  Discussed LBP in 10/22-LS spine XR reassuring at that time.  FINDINGS: There is no evidence of lumbar spine fracture. Alignment is normal. Intervertebral disc spaces are maintained. Atherosclerotic calcifications visualized along the abdominal aorta.   IMPRESSION: Negative.   Needs alfuzosin refilled.     History Patient Active Problem List   Diagnosis Date Noted   Tricuspid valve insufficiency 09/25/2020   Palpitations 09/25/2020   CKD (chronic kidney disease), stage II 09/25/2020   Murmur 07/18/2017   Jaundice 05/03/2016   Elevated LFTs    Abdominal pain 04/30/2016   Renal insufficiency 03/05/2013   HTN (hypertension) 03/06/2012   Prostate cancer ([place]) 10/04/2011   Past Medical History:  Diagnosis Date   Hypertension    Paget disease of bone    Questionable diagnosis.    Prostate cancer [facility])    Tricuspid regurgitation    Past Surgical History:  Procedure Laterality Date   BLADDER SURGERY     FINGER GANGLION CYST EXCISION  2011   3rd finger right hand   HYDROCELE EXCISION  2012   PENILE PROSTHESIS IMPLANT     TRIGGER FINGER RELEASE Left 08/2021   thumb   Allergies  Allergen Reactions   Latex Itching   Lisinopril Cough   Neuromuscular Blocking Agents Swelling    Ankle swelling Ankle swelling   Phenazopyridine Hcl Swelling    Ankle swelling   Pyridium [Phenazopyridine Hcl] Swelling    Ankle swelling   Sulfonamide Derivatives Itching   Prior to Admission medications   Medication Sig Start Date End Date Taking? Authorizing Provider  alfuzosin (UROXATRAL) 10 MG 24 hr tablet TAKE ONE TABLET BY MOUTH EVERY MORNING WITH BREAKFAST 09/21/21  Yes [name], MD  bevacizumab (AVASTIN) 2.5 mg/0.1 mL SOLN intravitreal injection 0.625 mg by Intravitreal route once.  Yes [provider]  diltiazem (TIAZAC) 180 MG 24 hr capsule TAKE ONE CAPSULE BY MOUTH EVERY NIGHT AT BEDTIME 04/07/22  Yes [name], MD  losartan-hydrochlorothiazide [facility]) 100-25 MG tablet TAKE ONE TABLET BY MOUTH DAILY 02/08/22  Yes [name], MD  potassium chloride (KLOR-CON M) 10 MEQ tablet TAKE ONE TABLET BY MOUTH TWICE A DAY 04/07/22  Yes [name], MD  Vitamin D, Ergocalciferol, 50000 units CAPS Take 50,000 Units by mouth every 30 (thirty) days.   Yes [provider]   Social History   Socioeconomic History   Marital status: Married    Spouse name: Not on file   Number of children: Not on file   Years of education: Not on file   Highest education level: Not on file  Occupational History   Occupation: professor    Employer: [employer]  Tobacco Use   Smoking status: Never   Smokeless tobacco: Never  Vaping Use   Vaping Use: Never used  Substance and Sexual Activity   Alcohol use: Yes    Comment: Beer occ   Drug use: No   Sexual activity: Yes    Partners: Female  Other Topics Concern   Not on file  Social History Narrative   Not on file   Social Determinants of Health   Financial Resource Strain: Not on file  Food Insecurity: Not on file  Transportation Needs: Not on file  Physical Activity: Not on file  Stress: Not on file  Social Connections: Not on file  Intimate Partner Violence: Not on file    Review of Systems Per HPI.   Objective:   Vitals:   04/19/22 1058  BP: 128/76  Pulse: 63  Resp: 17  Temp: 98.3 F (36.8 C)  TempSrc: Temporal  SpO2: 98%  Weight: 242 lb 6.4 oz (110 kg)  Height: '6\' 1"'$  (1.854 m)     Physical Exam Vitals reviewed.  Constitutional:      Appearance: He is well-developed.  HENT:     Head: Normocephalic and atraumatic.  Neck:     Vascular: No carotid bruit or JVD.  Cardiovascular:     Rate and Rhythm: Normal rate and regular rhythm.     Heart sounds: Normal heart sounds. No murmur heard. Pulmonary:     Effort: Pulmonary effort is normal.     Breath sounds: Normal breath sounds. No rales.  Musculoskeletal:     Right lower leg: No edema.     Left lower leg: No edema.     Comments: .Locates area of discomfort to the left lower paraspinal muscles no midline lumbar spine tenderness or CVA tenderness.  Negative seated straight leg raise.  Strength intact distally and ambulating without assistive device.  Skin:    General: Skin is warm and dry.  Neurological:     Mental Status: He  is alert and oriented to person, place, and time.  Psychiatric:        Mood and Affect: Mood normal.   Left hip, no focal tenderness, no lateral bony tenderness, pain-free range of motion    Assessment & Plan:  Jeffrey Perry is a 76 y.o. male . Left-sided low back pain with left-sided sciatica, unspecified chronicity - Plan: DG Lumbar Spine Complete, predniSONE (DELTASONE) 20 MG tablet Paget's bone disease - Plan: DG Lumbar Spine Complete  -Suspected lumbar pain.  Previous imaging reassuring but with history of Paget's we will repeat imaging.  Slight improvement in symptoms past 2 days.  Continue  symptomatic care with option to start prednisone if symptoms worsen.  Potential side effects and risks with prednisone discussed.  Recheck next few weeks.  Can also discuss medications for travel at his next appointment.  Essential hypertension - Plan: alfuzosin (UROXATRAL) 10 MG 24 hr tablet, losartan-hydrochlorothiazide (HYZAAR) 100-25 MG tablet  -Improved, no med changes at this time. Meds ordered this encounter  Medications   alfuzosin (UROXATRAL) 10 MG 24 hr tablet    Sig: TAKE ONE TABLET BY MOUTH EVERY MORNING WITH BREAKFAST    Dispense:  90 tablet    Refill:  1   losartan-hydrochlorothiazide (HYZAAR) 100-25 MG tablet    Sig: Take 1 tablet by mouth daily.    Dispense:  90 tablet    Refill:  1   predniSONE (DELTASONE) 20 MG tablet    Sig: 3 by mouth for 3 days, then 2 by mouth for 2 days, then 1 by mouth for 2 days, then 1/2 by mouth for 2 days.    Dispense:  16 tablet    Refill:  0   Patient Instructions  [facility]  for xray.  Walk in 8:30-4:30 during weekdays, no appointment needed [place].  [place], [place] [address]  Please have x-ray performed of your back.  Since it is improving the past day or 2, okay to continue range of motion, heat or ice to that area if it feels better.  See other information below on back pain with sciatica.  If the pain starts to worsen again, I did  send prednisone to your pharmacy. No change in blood pressure medicines for now.  Recheck back pain in 3 weeks and we can discuss medications for travel at that time.  Return to the clinic or go to the nearest emergency room if any of your symptoms worsen or new symptoms occur.  Acute Back Pain, Adult Acute back pain is sudden and usually short-lived. It is often caused by an injury to the muscles and tissues in the back. The injury may result from: A muscle, tendon, or ligament getting overstretched or torn. Ligaments are tissues that connect bones to each other. Lifting something improperly can cause a back strain. Wear and tear (degeneration) of the spinal disks. Spinal disks are circular tissue that provide cushioning between the bones of the spine (vertebrae). Twisting motions, such as while playing sports or doing yard work. A hit to the back. Arthritis. You may have a physical exam, lab tests, and imaging tests to find the cause of your pain. Acute back pain usually goes away with rest and home care. Follow these instructions at home: Managing pain, stiffness, and swelling Take over-the-counter and prescription medicines only as told by your health care provider. Treatment may include medicines for pain and inflammation that are taken by mouth or applied to the skin, or muscle relaxants. Your health care provider may recommend applying ice during the first 24-48 hours after your pain starts. To do this: Put ice in a plastic bag. Place a towel between your skin and the bag. Leave the ice on for 20 minutes, 2-3 times a day. Remove the ice if your skin turns bright red. This is very important. If you cannot feel pain, heat, or cold, you have a greater risk of damage to the area. If directed, apply heat to the affected area as often as told by your health care provider. Use the heat source that your health care provider recommends, such as a moist heat pack or a  heating pad. Place a towel  between your skin and the heat source. Leave the heat on for 20-30 minutes. Remove the heat if your skin turns bright red. This is especially important if you are unable to feel pain, heat, or cold. You have a greater risk of getting burned. Activity  Do not stay in bed. Staying in bed for more than 1-2 days can delay your recovery. Sit up and stand up straight. Avoid leaning forward when you sit or hunching over when you stand. If you work at a desk, sit close to it so you do not need to lean over. Keep your chin tucked in. Keep your neck drawn back, and keep your elbows bent at a 90-degree angle (right angle). Sit high and close to the steering wheel when you drive. Add lower back (lumbar) support to your car seat, if needed. Take short walks on even surfaces as soon as you are able. Try to increase the length of time you walk each day. Do not sit, drive, or stand in one place for more than 30 minutes at a time. Sitting or standing for long periods of time can put stress on your back. Do not drive or use heavy machinery while taking prescription pain medicine. Use proper lifting techniques. When you bend and lift, use positions that put less stress on your back: [place] your knees. Keep the load close to your body. Avoid twisting. Exercise regularly as told by your health care provider. Exercising helps your back heal faster and helps prevent back injuries by keeping muscles strong and flexible. Work with a physical therapist to make a safe exercise program, as recommended by your health care provider. Do any exercises as told by your physical therapist. Lifestyle Maintain a healthy weight. Extra weight puts stress on your back and makes it difficult to have good posture. Avoid activities or situations that make you feel anxious or stressed. Stress and anxiety increase muscle tension and can make back pain worse. Learn ways to manage anxiety and stress, such as through exercise. General  instructions Sleep on a firm mattress in a comfortable position. Try lying on your side with your knees slightly bent. If you lie on your back, put a pillow under your knees. Keep your head and neck in a straight line with your spine (neutral position) when using electronic equipment like smartphones or pads. To do this: Raise your smartphone or pad to look at it instead of bending your head or neck to look down. Put the smartphone or pad at the level of your face while looking at the screen. Follow your treatment plan as told by your health care provider. This may include: Cognitive or behavioral therapy. Acupuncture or massage therapy. Meditation or yoga. Contact a health care provider if: You have pain that is not relieved with rest or medicine. You have increasing pain going down into your legs or buttocks. Your pain does not improve after 2 weeks. You have pain at night. You lose weight without trying. You have a fever or chills. You develop nausea or vomiting. You develop abdominal pain. Get help right away if: You develop new bowel or bladder control problems. You have unusual weakness or numbness in your arms or legs. You feel faint. These symptoms may represent a serious problem that is an emergency. Do not wait to see if the symptoms will go away. Get medical help right away. Call your local emergency services (911 in the [place] Do not drive yourself to  the hospital. Summary Acute back pain is sudden and usually short-lived. Use proper lifting techniques. When you bend and lift, use positions that put less stress on your back. Take over-the-counter and prescription medicines only as told by your health care provider, and apply heat or ice as told. This information is not intended to replace advice given to you by your health care provider. Make sure you discuss any questions you have with your health care provider. Document Revised: 02/06/2021 Document Reviewed:  02/06/2021 Elsevier Patient Education  [place]   Sciatica  Sciatica is pain, numbness, weakness, or tingling along the path of the sciatic nerve. The sciatic nerve starts in the lower back and runs down the back of each leg. The nerve controls the muscles in the lower leg and in the back of the knee. It also provides feeling (sensation) to the back of the thigh, the lower leg, and the sole of the foot. Sciatica is a symptom of another medical condition that pinches or puts pressure on the sciatic nerve. Sciatica most often only affects one side of the body. Sciatica usually goes away on its own or with treatment. In some cases, sciatica may come back (recur). What are the causes? This condition is caused by pressure on the sciatic nerve or pinching of the nerve. This may be the result of: A disk in between the bones of the spine bulging out too far (herniated disk). Age-related changes in the spinal disks. A pain disorder that affects a muscle in the buttock. Extra bone growth near the sciatic nerve. A break (fracture) of the pelvis. Pregnancy. Tumor. This is rare. What increases the risk? The following factors may make you more likely to develop this condition: Playing sports that place pressure or stress on the spine. Having poor strength and flexibility. A history of back injury or surgery. Sitting for long periods of time. Doing activities that involve repetitive bending or lifting. Obesity. What are the signs or symptoms? Symptoms can vary from mild to very severe, and they may include: Any of these problems in the lower back, leg, hip, or buttock: Mild tingling, numbness, or dull aches. Burning sensations. Sharp pains. Numbness in the back of the calf or the sole of the foot. Leg weakness. Severe back pain that makes movement difficult. Symptoms may get worse when you cough, sneeze, or laugh, or when you sit or stand for long periods of time. How is this  diagnosed? This condition may be diagnosed based on: Your symptoms and medical history. A physical exam. Blood tests. Imaging tests, such as: X-rays. MRI. CT scan. How is this treated? In many cases, this condition improves on its own without treatment. However, treatment may include: Reducing or modifying physical activity. Exercising and stretching. Icing and applying heat to the affected area. Medicines that help to: Relieve pain and swelling. Relax your muscles. Injections of medicines that help to relieve pain, irritation, and inflammation around the sciatic nerve (steroids). Surgery. Follow these instructions at home: Medicines Take over-the-counter and prescription medicines only as told by your health care provider. Ask your health care provider if the medicine prescribed to you: Requires you to avoid driving or using heavy machinery. Can cause constipation. You may need to take these actions to prevent or treat constipation: Drink enough fluid to keep your urine pale yellow. Take over-the-counter or prescription medicines. Eat foods that are high in fiber, such as beans, whole grains, and fresh fruits and vegetables. Limit foods that are high  in fat and processed sugars, such as fried or sweet foods. Managing pain     If directed, put ice on the affected area. Put ice in a plastic bag. Place a towel between your skin and the bag. Leave the ice on for 20 minutes, 2-3 times a day. If directed, apply heat to the affected area. Use the heat source that your health care provider recommends, such as a moist heat pack or a heating pad. Place a towel between your skin and the heat source. Leave the heat on for 20-30 minutes. Remove the heat if your skin turns bright red. This is especially important if you are unable to feel pain, heat, or cold. You may have a greater risk of getting burned. Activity  Return to your normal activities as told by your health care provider.  Ask your health care provider what activities are safe for you. Avoid activities that make your symptoms worse. Take brief periods of rest throughout the day. When you rest for longer periods, mix in some mild activity or stretching between periods of rest. This will help to prevent stiffness and pain. Avoid sitting for long periods of time without moving. Get up and move around at least one time each hour. Exercise and stretch regularly, as told by your health care provider. Do not lift anything that is heavier than 10 lb (4.5 kg) while you have symptoms of sciatica. When you do not have symptoms, you should still avoid heavy lifting, especially repetitive heavy lifting. When you lift objects, always use proper lifting technique, which includes: Bending your knees. Keeping the load close to your body. Avoiding twisting. General instructions Maintain a healthy weight. Excess weight puts extra stress on your back. Wear supportive, comfortable shoes. Avoid wearing high heels. Avoid sleeping on a mattress that is too soft or too hard. A mattress that is firm enough to support your back when you sleep may help to reduce your pain. Keep all follow-up visits as told by your health care provider. This is important. Contact a health care provider if: You have pain that: Wakes you up when you are sleeping. Gets worse when you lie down. Is worse than you have experienced in the past. Lasts longer than 4 weeks. You have an unexplained weight loss. Get help right away if: You are not able to control when you urinate or have bowel movements (incontinence). You have: Weakness in your lower back, pelvis, buttocks, or legs that gets worse. Redness or swelling of your back. A burning sensation when you urinate. Summary Sciatica is pain, numbness, weakness, or tingling along the path of the sciatic nerve. This condition is caused by pressure on the sciatic nerve or pinching of the nerve. Sciatica can  cause pain, numbness, or tingling in the lower back, legs, hips, and buttocks. Treatment often includes rest, exercise, medicines, and applying ice or heat. This information is not intended to replace advice given to you by your health care provider. Make sure you discuss any questions you have with your health care provider. Document Revised: 12/04/2018 Document Reviewed: 12/04/2018 Elsevier Patient Education  [place],   [name], MD [place], [facility] 04/19/22 12:02 PM

## 2022-04-19 NOTE — Patient Instructions (Addendum)
[facility]  for xray.  Walk in 8:30-4:30 during weekdays, no appointment needed [place].  [place], [place] [address]  Please have x-ray performed of your back.  Since it is improving the past day or 2, okay to continue range of motion, heat or ice to that area if it feels better.  See other information below on back pain with sciatica.  If the pain starts to worsen again, I did send prednisone to your pharmacy. No change in blood pressure medicines for now.  Recheck back pain in 3 weeks and we can discuss medications for travel at that time.  Return to the clinic or go to the nearest emergency room if any of your symptoms worsen or new symptoms occur.  Acute Back Pain, Adult Acute back pain is sudden and usually short-lived. It is often caused by an injury to the muscles and tissues in the back. The injury may result from: A muscle, tendon, or ligament getting overstretched or torn. Ligaments are tissues that connect bones to each other. Lifting something improperly can cause a back strain. Wear and tear (degeneration) of the spinal disks. Spinal disks are circular tissue that provide cushioning between the bones of the spine (vertebrae). Twisting motions, such as while playing sports or doing yard work. A hit to the back. Arthritis. You may have a physical exam, lab tests, and imaging tests to find the cause of your pain. Acute back pain usually goes away with rest and home care. Follow these instructions at home: Managing pain, stiffness, and swelling Take over-the-counter and prescription medicines only as told by your health care provider. Treatment may include medicines for pain and inflammation that are taken by mouth or applied to the skin, or muscle relaxants. Your health care provider may recommend applying ice during the first 24-48 hours after your pain starts. To do this: Put ice in a plastic bag. Place a towel between your skin and the bag. Leave the ice on for 20 minutes, 2-3  times a day. Remove the ice if your skin turns bright red. This is very important. If you cannot feel pain, heat, or cold, you have a greater risk of damage to the area. If directed, apply heat to the affected area as often as told by your health care provider. Use the heat source that your health care provider recommends, such as a moist heat pack or a heating pad. Place a towel between your skin and the heat source. Leave the heat on for 20-30 minutes. Remove the heat if your skin turns bright red. This is especially important if you are unable to feel pain, heat, or cold. You have a greater risk of getting burned. Activity  Do not stay in bed. Staying in bed for more than 1-2 days can delay your recovery. Sit up and stand up straight. Avoid leaning forward when you sit or hunching over when you stand. If you work at a desk, sit close to it so you do not need to lean over. Keep your chin tucked in. Keep your neck drawn back, and keep your elbows bent at a 90-degree angle (right angle). Sit high and close to the steering wheel when you drive. Add lower back (lumbar) support to your car seat, if needed. Take short walks on even surfaces as soon as you are able. Try to increase the length of time you walk each day. Do not sit, drive, or stand in one place for more than 30 minutes at a  time. Sitting or standing for long periods of time can put stress on your back. Do not drive or use heavy machinery while taking prescription pain medicine. Use proper lifting techniques. When you bend and lift, use positions that put less stress on your back: [place] your knees. Keep the load close to your body. Avoid twisting. Exercise regularly as told by your health care provider. Exercising helps your back heal faster and helps prevent back injuries by keeping muscles strong and flexible. Work with a physical therapist to make a safe exercise program, as recommended by your health care provider. Do any exercises as  told by your physical therapist. Lifestyle Maintain a healthy weight. Extra weight puts stress on your back and makes it difficult to have good posture. Avoid activities or situations that make you feel anxious or stressed. Stress and anxiety increase muscle tension and can make back pain worse. Learn ways to manage anxiety and stress, such as through exercise. General instructions Sleep on a firm mattress in a comfortable position. Try lying on your side with your knees slightly bent. If you lie on your back, put a pillow under your knees. Keep your head and neck in a straight line with your spine (neutral position) when using electronic equipment like smartphones or pads. To do this: Raise your smartphone or pad to look at it instead of bending your head or neck to look down. Put the smartphone or pad at the level of your face while looking at the screen. Follow your treatment plan as told by your health care provider. This may include: Cognitive or behavioral therapy. Acupuncture or massage therapy. Meditation or yoga. Contact a health care provider if: You have pain that is not relieved with rest or medicine. You have increasing pain going down into your legs or buttocks. Your pain does not improve after 2 weeks. You have pain at night. You lose weight without trying. You have a fever or chills. You develop nausea or vomiting. You develop abdominal pain. Get help right away if: You develop new bowel or bladder control problems. You have unusual weakness or numbness in your arms or legs. You feel faint. These symptoms may represent a serious problem that is an emergency. Do not wait to see if the symptoms will go away. Get medical help right away. Call your local emergency services (911 in the [place] Do not drive yourself to the hospital. Summary Acute back pain is sudden and usually short-lived. Use proper lifting techniques. When you bend and lift, use positions that put less stress  on your back. Take over-the-counter and prescription medicines only as told by your health care provider, and apply heat or ice as told. This information is not intended to replace advice given to you by your health care provider. Make sure you discuss any questions you have with your health care provider. Document Revised: 02/06/2021 Document Reviewed: 02/06/2021 Elsevier Patient Education  [place].   Sciatica  Sciatica is pain, numbness, weakness, or tingling along the path of the sciatic nerve. The sciatic nerve starts in the lower back and runs down the back of each leg. The nerve controls the muscles in the lower leg and in the back of the knee. It also provides feeling (sensation) to the back of the thigh, the lower leg, and the sole of the foot. Sciatica is a symptom of another medical condition that pinches or puts pressure on the sciatic nerve. Sciatica most often only affects one side of the  body. Sciatica usually goes away on its own or with treatment. In some cases, sciatica may come back (recur). What are the causes? This condition is caused by pressure on the sciatic nerve or pinching of the nerve. This may be the result of: A disk in between the bones of the spine bulging out too far (herniated disk). Age-related changes in the spinal disks. A pain disorder that affects a muscle in the buttock. Extra bone growth near the sciatic nerve. A break (fracture) of the pelvis. Pregnancy. Tumor. This is rare. What increases the risk? The following factors may make you more likely to develop this condition: Playing sports that place pressure or stress on the spine. Having poor strength and flexibility. A history of back injury or surgery. Sitting for long periods of time. Doing activities that involve repetitive bending or lifting. Obesity. What are the signs or symptoms? Symptoms can vary from mild to very severe, and they may include: Any of these problems in the lower  back, leg, hip, or buttock: Mild tingling, numbness, or dull aches. Burning sensations. Sharp pains. Numbness in the back of the calf or the sole of the foot. Leg weakness. Severe back pain that makes movement difficult. Symptoms may get worse when you cough, sneeze, or laugh, or when you sit or stand for long periods of time. How is this diagnosed? This condition may be diagnosed based on: Your symptoms and medical history. A physical exam. Blood tests. Imaging tests, such as: X-rays. MRI. CT scan. How is this treated? In many cases, this condition improves on its own without treatment. However, treatment may include: Reducing or modifying physical activity. Exercising and stretching. Icing and applying heat to the affected area. Medicines that help to: Relieve pain and swelling. Relax your muscles. Injections of medicines that help to relieve pain, irritation, and inflammation around the sciatic nerve (steroids). Surgery. Follow these instructions at home: Medicines Take over-the-counter and prescription medicines only as told by your health care provider. Ask your health care provider if the medicine prescribed to you: Requires you to avoid driving or using heavy machinery. Can cause constipation. You may need to take these actions to prevent or treat constipation: Drink enough fluid to keep your urine pale yellow. Take over-the-counter or prescription medicines. Eat foods that are high in fiber, such as beans, whole grains, and fresh fruits and vegetables. Limit foods that are high in fat and processed sugars, such as fried or sweet foods. Managing pain     If directed, put ice on the affected area. Put ice in a plastic bag. Place a towel between your skin and the bag. Leave the ice on for 20 minutes, 2-3 times a day. If directed, apply heat to the affected area. Use the heat source that your health care provider recommends, such as a moist heat pack or a heating  pad. Place a towel between your skin and the heat source. Leave the heat on for 20-30 minutes. Remove the heat if your skin turns bright red. This is especially important if you are unable to feel pain, heat, or cold. You may have a greater risk of getting burned. Activity  Return to your normal activities as told by your health care provider. Ask your health care provider what activities are safe for you. Avoid activities that make your symptoms worse. Take brief periods of rest throughout the day. When you rest for longer periods, mix in some mild activity or stretching between periods of rest. This will  help to prevent stiffness and pain. Avoid sitting for long periods of time without moving. Get up and move around at least one time each hour. Exercise and stretch regularly, as told by your health care provider. Do not lift anything that is heavier than 10 lb (4.5 kg) while you have symptoms of sciatica. When you do not have symptoms, you should still avoid heavy lifting, especially repetitive heavy lifting. When you lift objects, always use proper lifting technique, which includes: Bending your knees. Keeping the load close to your body. Avoiding twisting. General instructions Maintain a healthy weight. Excess weight puts extra stress on your back. Wear supportive, comfortable shoes. Avoid wearing high heels. Avoid sleeping on a mattress that is too soft or too hard. A mattress that is firm enough to support your back when you sleep may help to reduce your pain. Keep all follow-up visits as told by your health care provider. This is important. Contact a health care provider if: You have pain that: Wakes you up when you are sleeping. Gets worse when you lie down. Is worse than you have experienced in the past. Lasts longer than 4 weeks. You have an unexplained weight loss. Get help right away if: You are not able to control when you urinate or have bowel movements  (incontinence). You have: Weakness in your lower back, pelvis, buttocks, or legs that gets worse. Redness or swelling of your back. A burning sensation when you urinate. Summary Sciatica is pain, numbness, weakness, or tingling along the path of the sciatic nerve. This condition is caused by pressure on the sciatic nerve or pinching of the nerve. Sciatica can cause pain, numbness, or tingling in the lower back, legs, hips, and buttocks. Treatment often includes rest, exercise, medicines, and applying ice or heat. This information is not intended to replace advice given to you by your health care provider. Make sure you discuss any questions you have with your health care provider. Document Revised: 12/04/2018 Document Reviewed: 12/04/2018 Elsevier Patient Education  [place].

## 2022-04-21 ENCOUNTER — Encounter (INDEPENDENT_AMBULATORY_CARE_PROVIDER_SITE_OTHER): Payer: Medicare PPO | Admitting: Ophthalmology

## 2022-04-21 DIAGNOSIS — H43813 Vitreous degeneration, bilateral: Secondary | ICD-10-CM

## 2022-04-21 DIAGNOSIS — I10 Essential (primary) hypertension: Secondary | ICD-10-CM

## 2022-04-21 DIAGNOSIS — H35033 Hypertensive retinopathy, bilateral: Secondary | ICD-10-CM

## 2022-04-21 DIAGNOSIS — H2513 Age-related nuclear cataract, bilateral: Secondary | ICD-10-CM

## 2022-04-21 DIAGNOSIS — H3561 Retinal hemorrhage, right eye: Secondary | ICD-10-CM

## 2022-04-21 DIAGNOSIS — H318 Other specified disorders of choroid: Secondary | ICD-10-CM | POA: Diagnosis not present

## 2022-04-23 ENCOUNTER — Encounter: Payer: Self-pay | Admitting: Dermatology

## 2022-04-23 NOTE — Progress Notes (Signed)
   New Patient   Subjective  Jeffrey Perry is a 76 y.o. male who presents for the following: Skin Problem (Raised lesion on back of right lower leg-x months- getting bigger).  Check skin, growth and spot on leg. Location:  Duration:  Quality:  Associated Signs/Symptoms: Modifying Factors:  Severity:  Timing: Context:    The following portions of the chart were reviewed this encounter and updated as appropriate:  Tobacco  Allergies  Meds  Problems  Med Hx  Surg Hx  Fam Hx      Objective  Well appearing patient in no apparent distress; mood and affect are within normal limits. Full body skin examination: No other suspicious spots for skin cancer.  Right Lower Leg - Posterior Calf, 7 mm 2 toned (black superiorly) papule.  Lower part of papule with dermoscopy SK, upper darker section amorphous.  Darkly growing.         A full examination was performed including scalp, head, eyes, ears, nose, lips, neck, chest, axillae, abdomen, back, buttocks, bilateral upper extremities, bilateral lower extremities, hands, feet, fingers, toes, fingernails, and toenails. All findings within normal limits unless otherwise noted below.  Is beneath undergarment not fully examined.   Assessment & Plan  Neoplasm of uncertain behavior Right Lower Leg - Posterior  Skin / nail biopsy Type of biopsy: tangential   Informed consent: discussed and consent obtained   Timeout: patient name, date of birth, surgical site, and procedure verified   Procedure prep:  Patient was prepped and draped in usual sterile fashion (Non sterile) Prep type:  Chlorhexidine Anesthesia: the lesion was anesthetized in a standard fashion   Anesthetic:  1% lidocaine w/ epinephrine 1-100,000 local infiltration Instrument used: flexible razor blade   Outcome: patient tolerated procedure well   Post-procedure details: wound care instructions given    Specimen 1 - Surgical pathology Differential Diagnosis: r/o  sk  Check Margins: No  Encounter for screening for malignant neoplasm of skin  Should have annual skin examination depending on the results of his leg biopsy.  Epidermal cyst Right Upper Back

## 2022-05-12 ENCOUNTER — Ambulatory Visit (INDEPENDENT_AMBULATORY_CARE_PROVIDER_SITE_OTHER)
Admission: RE | Admit: 2022-05-12 | Discharge: 2022-05-12 | Disposition: A | Discharge status: Home / Self Care | Payer: Medicare PPO | Source: Ambulatory Visit | Attending: Family Medicine | Admitting: Family Medicine

## 2022-05-12 ENCOUNTER — Encounter: Payer: Self-pay | Admitting: Family Medicine

## 2022-05-12 ENCOUNTER — Ambulatory Visit (INDEPENDENT_AMBULATORY_CARE_PROVIDER_SITE_OTHER): Payer: Medicare PPO | Admitting: Family Medicine

## 2022-05-12 VITALS — BP 126/68 | HR 57 | Temp 98.2°F | Resp 18 | Ht 73.0 in | Wt 239.4 lb

## 2022-05-12 DIAGNOSIS — M5442 Lumbago with sciatica, left side: Secondary | ICD-10-CM

## 2022-05-12 DIAGNOSIS — W19XXXA Unspecified fall, initial encounter: Secondary | ICD-10-CM

## 2022-05-12 DIAGNOSIS — R000 Tachycardia, unspecified: Secondary | ICD-10-CM

## 2022-05-12 DIAGNOSIS — Z298 Encounter for other specified prophylactic measures: Secondary | ICD-10-CM

## 2022-05-12 DIAGNOSIS — S5012XA Contusion of left forearm, initial encounter: Secondary | ICD-10-CM | POA: Diagnosis not present

## 2022-05-12 DIAGNOSIS — Y92009 Unspecified place in unspecified non-institutional (private) residence as the place of occurrence of the external cause: Secondary | ICD-10-CM

## 2022-05-12 DIAGNOSIS — Z2989 Encounter for other specified prophylactic measures: Secondary | ICD-10-CM

## 2022-05-12 NOTE — Progress Notes (Signed)
Subjective:  Patient ID: Jeffrey Perry, male    DOB: [birth year]  Age: 76 y.o. MRN: [id number]  CC:  Chief Complaint  Patient presents with   Back Pain    Patient states he is still has some lower pain in back and that's probably because he fell and when he did he had a irregular heartbeat that day. He is just following up    HPI Jeffrey Perry presents for   Low back pain Discussed May 22 visit.  History of Paget's treated with Reclast with rheumatology in the past.  Pain in the left hip and buttock area off and on but worse preceding 2 weeks when discussed May 22.  No new fall or injury at that time, pain radiating to the foot.  Suspected lumbar source with possible component of sciatica.  Repeat imaging obtained, symptomatic care with option to start prednisone if symptoms worsened.  Lumbar spine x-ray on 04/19/2022: Vertebral body heights are maintained. The intervertebral disc heights are largely maintained. Lower lumbar facet arthropathy. Mild (grade 1) anterolisthesis of L4 on L5. No significant change from the prior.  Atherosclerotic vascular calcifications  Pain was improving after last visit. Did take prednisone, did not feel right while taking prednisone. Felt slower. Did finish prednisone, but pain still in left back - not resolved.  No bowel or bladder incontinence, no saddle anesthesia, no lower extremity weakness. Cut amount of walking in 1/2, able to walk, but numbness to front of left thigh.Taking tylenol once per day - helping.   2 days after finishing prednisone, missed a step on June 3rd. No preceding near-syncope, palpitations or chest pain.  Fell onto left side. Struck left forearm, elbow, no medical eval. No limitations in use. Bruising. No wounds. Still some soreness in forearm, but improved. Using normally.   Irregular heartbeat: Noticed night after fall on June 3rd, Apple watch woke up in middle of night. HR 93-111. Felt ok. Did not feel palpitations.  No chest pains, or  focal weakness, not lightheaded or dizzy.  No recurrence. Had just finished prednisone.  Cardiologist - Dr. Percival Spanish. Not concerned when discussed in past.  Malaria prophylaxis Traveling to [place].  Will be visiting family.  Typhoid vaccine in May 2018, planned booster in 5 years.    History Patient Active Problem List   Diagnosis Date Noted   Tricuspid valve insufficiency 09/25/2020   Palpitations 09/25/2020   CKD (chronic kidney disease), stage II 09/25/2020   Murmur 07/18/2017   Jaundice 05/03/2016   Elevated LFTs    Abdominal pain 04/30/2016   Renal insufficiency 03/05/2013   HTN (hypertension) 03/06/2012   Prostate cancer ([place]) 10/04/2011   Past Medical History:  Diagnosis Date   Hypertension    Paget disease of bone    Questionable diagnosis.    Prostate cancer [facility])    Tricuspid regurgitation    Past Surgical History:  Procedure Laterality Date   BLADDER SURGERY     FINGER GANGLION CYST EXCISION  2011   3rd finger right hand   HYDROCELE EXCISION  2012   PENILE PROSTHESIS IMPLANT     TRIGGER FINGER RELEASE Left 08/2021   thumb   Allergies  Allergen Reactions   Latex Itching   Lisinopril Cough   Neuromuscular Blocking Agents Swelling    Ankle swelling Ankle swelling   Phenazopyridine Hcl Swelling    Ankle swelling   Pyridium [Phenazopyridine Hcl] Swelling    Ankle swelling   Sulfonamide Derivatives Itching   Prior to Admission medications  Medication Sig Start Date End Date Taking? Authorizing Provider  alfuzosin (UROXATRAL) 10 MG 24 hr tablet TAKE ONE TABLET BY MOUTH EVERY MORNING WITH BREAKFAST 04/19/22  Yes [name], MD  bevacizumab (AVASTIN) 2.5 mg/0.1 mL SOLN intravitreal injection 0.625 mg by Intravitreal route once.   Yes [provider]  diltiazem (TIAZAC) 180 MG 24 hr capsule TAKE ONE CAPSULE BY MOUTH EVERY NIGHT AT BEDTIME 04/07/22  Yes [name], MD  losartan-hydrochlorothiazide (HYZAAR) 100-25 MG tablet Take 1 tablet  by mouth daily. 04/19/22  Yes [name], MD  potassium chloride (KLOR-CON M) 10 MEQ tablet TAKE ONE TABLET BY MOUTH TWICE A DAY 04/07/22  Yes [name], MD  Vitamin D, Ergocalciferol, 50000 units CAPS Take 50,000 Units by mouth every 30 (thirty) days.   Yes [provider]  predniSONE (DELTASONE) 20 MG tablet 3 by mouth for 3 days, then 2 by mouth for 2 days, then 1 by mouth for 2 days, then 1/2 by mouth for 2 days. 04/19/22   [name], MD   Social History   Socioeconomic History   Marital status: Married    Spouse name: Not on file   Number of children: Not on file   Years of education: Not on file   Highest education level: Not on file  Occupational History   Occupation: professor    Employer: [employer]  Tobacco Use   Smoking status: Never   Smokeless tobacco: Never  Vaping Use   Vaping Use: Never used  Substance and Sexual Activity   Alcohol use: Yes    Comment: Beer occ   Drug use: No   Sexual activity: Yes    Partners: Female  Other Topics Concern   Not on file  Social History Narrative   Not on file   Social Determinants of Health   Financial Resource Strain: Not on file  Food Insecurity: Not on file  Transportation Needs: Not on file  Physical Activity: Not on file  Stress: Not on file  Social Connections: Not on file  Intimate Partner Violence: Not on file    Review of Systems Per HPI.   Objective:   Vitals:   05/12/22 1128  BP: 126/68  Pulse: (!) 57  Resp: 18  Temp: 98.2 F (36.8 C)  TempSrc: Temporal  SpO2: 98%  Weight: 239 lb 6.4 oz (108.6 kg)  Height: '6\' 1"'$  (1.854 m)     Physical Exam Vitals reviewed.  Constitutional:      Appearance: He is well-developed.  HENT:     Head: Normocephalic and atraumatic.  Neck:     Vascular: No carotid bruit or JVD.  Cardiovascular:     Rate and Rhythm: Normal rate and regular rhythm.     Heart sounds: Normal heart sounds. No murmur heard. Pulmonary:     Effort: Pulmonary  effort is normal.     Breath sounds: Normal breath sounds. No rales.  Musculoskeletal:     Right lower leg: No edema.     Left lower leg: No edema.     Comments: Lumbar spine, no focal midline bony tenderness, negative seated straight leg raise and able to ambulate without difficulty.  Skin:    General: Skin is warm and dry.  Neurological:     Mental Status: He is alert and oriented to person, place, and time.  Psychiatric:        Mood and Affect: Mood normal.       EKG sinus bradycardia, rate  50, left anterior fascicular block.  No significant changes compared to EKG on 02/26/22 Assessment & Plan:  Jeffrey Perry is a 76 y.o. male . Fall in home, initial encounter Contusion of left forearm, initial encounter - Plan: DG Forearm Left  -Contusion without fracture noted on x-ray.  Pain-free elbow range of motion, no bony tenderness.  Continue symptomatic care with RTC precautions.  Mechanical fall, no preceding symptoms of concern.  Left-sided low back pain with left-sided sciatica, unspecified chronicity - Plan: Ambulatory referral to Spine Surgery  -Due to continued symptoms and anterolisthesis, will refer to back specialist.  Tachycardia - Plan: EKG 12-Lead  -New concern, noted on Apple Watch.  Asymptomatic.  No concerns on EKG.  Recommended follow-up or discussion with his cardiologist if symptoms recur, especially if any associated symptoms.  RTC/ER precautions given.  Need for malaria prophylaxis I called after office visit, discussed details.  See separate call log.  Recommended typhoid vaccine through health department, and to check CDC for other recommendations.  Malaria prophylaxis with doxycycline few days prior to travel, continuing through travel and 4 weeks after return home.  Potential side effects discussed.  No orders of the defined types were placed in this encounter.  Patient Instructions  I will refer you to a back specialist to decide the next step, possibly physical  therapy versus other imaging.  Okay to use Tylenol for back pain for now.  Let me know if any changes or worsening symptoms.  Please have x-ray of your left forearm performed at [facility].  I suspect you just bruised that area and it should continue to improve but if any concerns on x-ray I will let you know.  I do not see any changes in your EKG today.  If you notice any heart palpitations or return of fast heart rate, let me know or call your cardiologist.  Thanks for coming in today.  Return to the clinic or go to the nearest emergency room if any of your symptoms worsen or new symptoms occur.     If you have lab work done today you will be contacted with your lab results within the next 2 weeks.  If you have not heard from [place] then please contact us. The fastest way to get your results is to register for My Chart.   IF you received an x-ray today, you will receive an invoice from [facility]. Please contact [facility] at [phone] with questions or concerns regarding your invoice.   IF you received labwork today, you will receive an invoice from [facility]. Please contact [facility] at [phone] with questions or concerns regarding your invoice.   Our billing staff will not be able to assist you with questions regarding bills from these companies.  You will be contacted with the lab results as soon as they are available. The fastest way to get your results is to activate your My Chart account. Instructions are located on the last page of this paperwork. If you have not heard from [place] regarding the results in 2 weeks, please contact this office.       Signed,   [name], MD [place] 05/12/22 12:03 PM

## 2022-05-12 NOTE — Patient Instructions (Addendum)
I will refer you to a back specialist to decide the next step, possibly physical therapy versus other imaging.  Okay to use Tylenol for back pain for now.  Let me know if any changes or worsening symptoms.  Please have x-ray of your left forearm performed at [facility].  I suspect you just bruised that area and it should continue to improve but if any concerns on x-ray I will let you know.  I do not see any changes in your EKG today.  If you notice any heart palpitations or return of fast heart rate, let me know or call your cardiologist.  Thanks for coming in today.  Return to the clinic or go to the nearest emergency room if any of your symptoms worsen or new symptoms occur.     If you have lab work done today you will be contacted with your lab results within the next 2 weeks.  If you have not heard from [place] then please contact us. The fastest way to get your results is to register for My Chart.   IF you received an x-ray today, you will receive an invoice from [facility]. Please contact [facility] at [phone] with questions or concerns regarding your invoice.   IF you received labwork today, you will receive an invoice from [facility]. Please contact [facility] at [phone] with questions or concerns regarding your invoice.   Our billing staff will not be able to assist you with questions regarding bills from these companies.  You will be contacted with the lab results as soon as they are available. The fastest way to get your results is to activate your My Chart account. Instructions are located on the last page of this paperwork. If you have not heard from [place] regarding the results in 2 weeks, please contact this office.

## 2022-05-16 ENCOUNTER — Telehealth: Payer: Self-pay | Admitting: Family Medicine

## 2022-05-16 DIAGNOSIS — Z298 Encounter for other specified prophylactic measures: Secondary | ICD-10-CM

## 2022-05-16 MED ORDER — DOXYCYCLINE HYCLATE 100 MG PO TABS: 100.0000 mg | ORAL_TABLET | Freq: Every day | ORAL | 0 refills | Status: DC

## 2022-05-16 NOTE — Telephone Encounter (Signed)
Called pt with plans for trip approx 7/27 through 09/14/22 to [place].  Doxycycline tolerated prior. Would like same. Potential side effects discussed.  Appears to be due for typhoid -recommended contacting health dept for injection. Also advised to check CDC travel website for other recommendations. Understanding expressed.   Immunization History  Administered Date(s) Administered   DTaP 03/22/2008   Gamma Globulin 06/08/1994   H1N1 10/03/2004   Hepatitis A 03/29/1996, 06/10/1998   Hepatitis B 03/22/2008, 10/07/2008   IPV 03/18/2003   Meningococcal Conjugate 03/22/2008   PFIZER(Purple Top)SARS-COV-2 Vaccination 01/20/2020, 02/12/2020, 07/30/2020   Pneumococcal Conjugate-13 05/02/2010, 01/31/2019   Pneumococcal Polysaccharide-23 03/04/2014   Pneumococcal-Unspecified 05/02/2010   Tdap 03/22/2008, 03/24/2018   Typhoid Parenteral 05/29/2002   Yellow Fever 03/22/2008   Zoster, Live 03/29/2010

## 2022-05-17 ENCOUNTER — Encounter: Payer: Self-pay | Admitting: Family Medicine

## 2022-05-19 ENCOUNTER — Encounter (INDEPENDENT_AMBULATORY_CARE_PROVIDER_SITE_OTHER): Payer: Medicare PPO | Admitting: Ophthalmology

## 2022-05-19 DIAGNOSIS — H43813 Vitreous degeneration, bilateral: Secondary | ICD-10-CM

## 2022-05-19 DIAGNOSIS — I10 Essential (primary) hypertension: Secondary | ICD-10-CM

## 2022-05-19 DIAGNOSIS — H318 Other specified disorders of choroid: Secondary | ICD-10-CM

## 2022-05-19 DIAGNOSIS — H35033 Hypertensive retinopathy, bilateral: Secondary | ICD-10-CM

## 2022-05-21 NOTE — Progress Notes (Signed)
Office Visit Note  [name]             Date of Birth: 08/08/1946           MRN: [id number]             PCP: [name], MD Referring: [name], MD Visit Date: 06/03/2022 Occupation: '@GUAROCC'$ @  Subjective:  Other (Patient reports fall on 05/01/2022 and patient is experiencing left hip pain. Patient did see Dr. Carlota Raspberry and x-rays were done. )   History of Present Illness: Jeffrey Perry is a 76 y.o. male with history of Paget's disease.  He states on June 3rd he was coming down the step and missed a step.  He landed on his left hip and he is still having some discomfort.  He had x-rays done by Dr. Carlota Raspberry which were unremarkable per patient.  None of the other joints are painful.  He is leaving for [place] on June 25, 2022 for two and a half months.  Activities of Daily Living:  Patient reports morning stiffness for 0 minutes.   Patient Denies nocturnal pain.  Difficulty dressing/grooming: Denies Difficulty climbing stairs: Denies Difficulty getting out of chair: Denies Difficulty using hands for taps, buttons, cutlery, and/or writing: Denies  Review of Systems  Constitutional:  Negative for fatigue.  HENT:  Negative for mouth sores, mouth dryness and nose dryness.   Eyes:  Positive for dryness. Negative for pain and itching.  Respiratory:  Negative for shortness of breath and difficulty breathing.   Cardiovascular:  Negative for chest pain and palpitations.  Gastrointestinal:  Negative for blood in stool, constipation and diarrhea.  Endocrine: Negative for increased urination.  Genitourinary:  Negative for difficulty urinating.  Musculoskeletal:  Positive for joint pain, joint pain, myalgias and myalgias. Negative for joint swelling, morning stiffness and muscle tenderness.  Skin:  Negative for color change, rash and redness.  Allergic/Immunologic: Negative for susceptible to infections.  Neurological:  Positive for numbness. Negative for dizziness, headaches,  memory loss and weakness.  Hematological:  Negative for bruising/bleeding tendency.  Psychiatric/Behavioral:  Negative for confusion.     PMFS History:  Patient Active Problem List   Diagnosis Date Noted   Tricuspid valve insufficiency 09/25/2020   Palpitations 09/25/2020   CKD (chronic kidney disease), stage II 09/25/2020   Murmur 07/18/2017   Jaundice 05/03/2016   Elevated LFTs    Abdominal pain 04/30/2016   Renal insufficiency 03/05/2013   HTN (hypertension) 03/06/2012   Prostate cancer ([place]) 10/04/2011    Past Medical History:  Diagnosis Date   Hypertension    Paget disease of bone    Questionable diagnosis.    Prostate cancer ([place])    Tricuspid regurgitation     Family History  Problem Relation Age of Onset   Hypertension Sister    Hypertension Brother    Hypertension Mother    Hypertension Brother    Stroke Sister    Hypertension Sister    Colon cancer Neg Hx    Past Surgical History:  Procedure Laterality Date   BLADDER SURGERY     FINGER GANGLION CYST EXCISION  2011   3rd finger right hand   HYDROCELE EXCISION  2012   PENILE PROSTHESIS IMPLANT     TRIGGER FINGER RELEASE Left 08/2021   thumb   Social History   Social History Narrative   Not on file   Immunization History  Administered Date(s) Administered   DTaP 03/22/2008   Gamma Globulin 06/08/1994  H1N1 10/03/2004   Hepatitis A 03/29/1996, 06/10/1998   Hepatitis B 03/22/2008, 10/07/2008   IPV 03/18/2003   Meningococcal Conjugate 03/22/2008   PFIZER(Purple Top)SARS-COV-2 Vaccination 01/20/2020, 02/12/2020, 07/30/2020   Pneumococcal Conjugate-13 05/02/2010, 01/31/2019   Pneumococcal Polysaccharide-23 03/04/2014   Pneumococcal-Unspecified 05/02/2010   Tdap 03/22/2008, 03/24/2018   Typhoid Parenteral 05/29/2002   Yellow Fever 03/22/2008   Zoster, Live 03/29/2010     Objective: Vital Signs: BP 124/77 (BP Location: Left Arm, Patient Position: Sitting, Cuff Size: Normal)   Pulse 87   Ht 6'  1" (1.854 m)   Wt 242 lb 6.4 oz (110 kg)   BMI 31.98 kg/m    Physical Exam Vitals and nursing note reviewed.  Constitutional:      Appearance: He is well-developed.  HENT:     Head: Normocephalic and atraumatic.  Eyes:     Conjunctiva/sclera: Conjunctivae normal.     Pupils: Pupils are equal, round, and reactive to light.  Cardiovascular:     Rate and Rhythm: Normal rate and regular rhythm.     Heart sounds: Normal heart sounds.  Pulmonary:     Effort: Pulmonary effort is normal.     Breath sounds: Normal breath sounds.  Abdominal:     General: Bowel sounds are normal.     Palpations: Abdomen is soft.  Musculoskeletal:     Cervical back: Normal range of motion and neck supple.  Skin:    General: Skin is warm and dry.     Capillary Refill: Capillary refill takes less than 2 seconds.  Neurological:     Mental Status: He is alert and oriented to person, place, and time.  Psychiatric:        Behavior: Behavior normal.      Musculoskeletal Exam: C-spine was in good range of motion.  Shoulder joints, elbow joints, wrist joints, MCPs PIPs and DIPs with good range of motion with no synovitis.  Hip joints, knee joints, ankles, MTPs and PIPs with good range of motion.  He had tenderness over left greater trochanteric region.  CDAI Exam: CDAI Score: -- Patient Global: --; Provider Global: -- Swollen: --; Tender: -- Joint Exam 06/03/2022   No joint exam has been documented for this visit   There is currently no information documented on the homunculus. Go to the Rheumatology activity and complete the homunculus joint exam.  Investigation: No additional findings.  Imaging: DG Forearm Left  Result Date: 05/12/2022 CLINICAL DATA:  Follow-up left forearm contusion 05/01/2022. Tender to palpation of mid ulna. EXAM: LEFT FOREARM - 2 VIEW COMPARISON:  None Available. FINDINGS: Normal bone mineralization. No acute fracture. Minimal chronic enthesopathic change at the triceps  insertion on the olecranon. Minimal degenerative spurring at the medial aspect of the elbow. Soft tissues are unremarkable. IMPRESSION: No acute fracture. Electronically Signed   By: Yvonne Kendall M.D.   On: 05/12/2022 13:28    Recent Labs: Lab Results  Component Value Date   WBC 4.5 03/23/2021   HGB 12.1 (L) 03/23/2021   PLT 159.0 03/23/2021   NA 137 05/25/2022   K 3.7 05/25/2022   CL 103 05/25/2022   CO2 25 05/25/2022   GLUCOSE 154 (H) 05/25/2022   BUN 16 05/25/2022   CREATININE 1.34 (H) 05/25/2022   BILITOT 0.4 05/25/2022   ALKPHOS 58 09/21/2021   AST 20 05/25/2022   ALT 15 05/25/2022   PROT 6.6 05/25/2022   ALBUMIN 4.2 09/21/2021   CALCIUM 9.1 05/25/2022   GFRAA 60 06/04/2021    Speciality  Comments: No specialty comments available.  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 - Patient received IV Reclast 5 mg on May 09, 2020.  He had no recurrence of symptoms.  Labs obtained on May 25, 2022 alkaline phosphatase was 75 which has been stable.  Lab values were discussed with the patient.  I will recheck labs in 6 months.- Plan: COMPLETE METABOLIC PANEL WITH GFR  Pain in left hip-patient had a fall in June 2023 and he landed on his left hip.  He was evaluated by Dr. Nyoka Cowden.  He still have some residual pain.  Elevated alkaline phosphatase level - Plan: COMPLETE METABOLIC PANEL WITH GFR in 6 months.  Trigger thumb, left thumb - Resolved after surgery.  Stage 3a chronic kidney disease (HCC) - Followed by Dr. Marval Regal.  Prostate cancer ([place])  Essential hypertension-blood pressure was normal today.  Orders: Orders Placed This Encounter  Procedures   COMPLETE METABOLIC PANEL WITH GFR   No orders of the defined types were placed in this  encounter.    Follow-Up Instructions: Return for Paget's.   Bo Merino, MD  Note - This record has been created using Editor, commissioning.  Chart creation errors have been sought, but may not always  have been located. Such creation errors do not reflect on  the standard of medical care.

## 2022-05-25 ENCOUNTER — Other Ambulatory Visit: Payer: Self-pay | Admitting: *Deleted

## 2022-05-25 DIAGNOSIS — M889 Osteitis deformans of unspecified bone: Secondary | ICD-10-CM

## 2022-05-26 ENCOUNTER — Other Ambulatory Visit: Payer: Self-pay | Admitting: *Deleted

## 2022-05-26 LAB — COMPLETE METABOLIC PANEL WITH GFR
AG Ratio: 1.4 (calc) (ref 1.0–2.5)
ALT: 15 U/L (ref 9–46)
AST: 20 U/L (ref 10–35)
Albumin: 3.9 g/dL (ref 3.6–5.1)
Alkaline phosphatase (APISO): 75 U/L (ref 35–144)
BUN/Creatinine Ratio: 12 (calc) (ref 6–22)
BUN: 16 mg/dL (ref 7–25)
CO2: 25 mmol/L (ref 20–32)
Calcium: 9.1 mg/dL (ref 8.6–10.3)
Chloride: 103 mmol/L (ref 98–110)
Creat: 1.34 mg/dL — ABNORMAL HIGH (ref 0.70–1.28)
Globulin: 2.7 g/dL (calc) (ref 1.9–3.7)
Glucose, Bld: 154 mg/dL — ABNORMAL HIGH (ref 65–99)
Potassium: 3.7 mmol/L (ref 3.5–5.3)
Sodium: 137 mmol/L (ref 135–146)
Total Bilirubin: 0.4 mg/dL (ref 0.2–1.2)
Total Protein: 6.6 g/dL (ref 6.1–8.1)
eGFR: 56 mL/min/{1.73_m2} — ABNORMAL LOW (ref >=60)

## 2022-05-26 NOTE — Progress Notes (Signed)
Creatinine is elevated most likely due to diuretic use.  Alkaline phosphatase is elevated compared to the values 2 months ago.  Please have patient repeat alkaline phosphatase with fractionation in 1 month.

## 2022-05-27 ENCOUNTER — Telehealth: Payer: Self-pay | Admitting: *Deleted

## 2022-05-27 DIAGNOSIS — R748 Abnormal levels of other serum enzymes: Secondary | ICD-10-CM

## 2022-05-27 NOTE — Telephone Encounter (Signed)
-----   Message from Bo Merino, MD sent at 05/26/2022  8:42 AM EDT ----- Creatinine is elevated most likely due to diuretic use.  Alkaline phosphatase is elevated compared to the values 2 months ago.  Please have patient repeat alkaline phosphatase with fractionation in 1 month.

## 2022-06-03 ENCOUNTER — Ambulatory Visit: Payer: Medicare PPO | Admitting: Rheumatology

## 2022-06-03 ENCOUNTER — Encounter: Payer: Self-pay | Admitting: Rheumatology

## 2022-06-03 VITALS — BP 124/77 | HR 87 | Ht 73.0 in | Wt 242.4 lb

## 2022-06-03 DIAGNOSIS — M25552 Pain in left hip: Secondary | ICD-10-CM | POA: Diagnosis not present

## 2022-06-03 DIAGNOSIS — R748 Abnormal levels of other serum enzymes: Secondary | ICD-10-CM | POA: Diagnosis not present

## 2022-06-03 DIAGNOSIS — M889 Osteitis deformans of unspecified bone: Secondary | ICD-10-CM | POA: Diagnosis not present

## 2022-06-03 DIAGNOSIS — C61 Malignant neoplasm of prostate: Secondary | ICD-10-CM

## 2022-06-03 DIAGNOSIS — I10 Essential (primary) hypertension: Secondary | ICD-10-CM

## 2022-06-03 DIAGNOSIS — M65312 Trigger thumb, left thumb: Secondary | ICD-10-CM

## 2022-06-03 DIAGNOSIS — N1831 Chronic kidney disease, stage 3a: Secondary | ICD-10-CM

## 2022-06-16 ENCOUNTER — Encounter (INDEPENDENT_AMBULATORY_CARE_PROVIDER_SITE_OTHER): Payer: Medicare PPO | Admitting: Ophthalmology

## 2022-06-16 DIAGNOSIS — H318 Other specified disorders of choroid: Secondary | ICD-10-CM | POA: Diagnosis not present

## 2022-06-16 DIAGNOSIS — H35033 Hypertensive retinopathy, bilateral: Secondary | ICD-10-CM

## 2022-06-16 DIAGNOSIS — I10 Essential (primary) hypertension: Secondary | ICD-10-CM

## 2022-06-16 DIAGNOSIS — H43813 Vitreous degeneration, bilateral: Secondary | ICD-10-CM | POA: Diagnosis not present

## 2022-06-23 ENCOUNTER — Ambulatory Visit (INDEPENDENT_AMBULATORY_CARE_PROVIDER_SITE_OTHER): Payer: Medicare PPO

## 2022-06-23 DIAGNOSIS — Z0000 Encounter for general adult medical examination without abnormal findings: Secondary | ICD-10-CM | POA: Diagnosis not present

## 2022-06-23 NOTE — Progress Notes (Signed)
Subjective:   Jeffrey Perry is a 76 y.o. male who presents for an Initial Medicare Annual Wellness Visit.   I connected with Jeffrey Perry  today by telephone and verified that I am speaking with the correct person using two identifiers. Location patient: home Location provider: work Persons participating in the virtual visit: patient, provider.   I discussed the limitations, risks, security and privacy concerns of performing an evaluation and management service by telephone and the availability of in person appointments. I also discussed with the patient that there may be a patient responsible charge related to this service. The patient expressed understanding and verbally consented to this telephonic visit.    Interactive audio and video telecommunications were attempted between this provider and patient, however failed, due to patient having technical difficulties OR patient did not have access to video capability.  We continued and completed visit with audio only.    Review of Systems     Cardiac Risk Factors include: advanced age (>74mn, >>41women);male gender     Objective:    Today's Vitals   There is no height or weight on file to calculate BMI.     06/23/2022    1:58 PM 02/07/2020    8:22 AM 05/07/2016   12:18 PM 05/02/2016    8:30 PM 04/30/2016    8:30 PM 04/29/2016    6:25 PM  Advanced Directives  Does Patient Have a Medical Advance Directive? Yes No No No No No  Type of [place] will Living will;Healthcare Power of Attorney      Does patient want to make changes to medical advance directive?  Yes (Inpatient - patient requests chaplain consult to change a medical advance directive)      Copy of [place] Chart? No - copy requested No - copy requested      Would patient like information on creating a medical advance directive?  Yes (Inpatient - patient requests chaplain consult to create a medical advance  directive) No - patient declined information No - patient declined information      Current Medications (verified) Outpatient Encounter Medications as of 06/23/2022  Medication Sig   alfuzosin (UROXATRAL) 10 MG 24 hr tablet TAKE ONE TABLET BY MOUTH EVERY MORNING WITH BREAKFAST   bevacizumab (AVASTIN) 2.5 mg/0.1 mL SOLN intravitreal injection 0.625 mg by Intravitreal route once.   diltiazem (TIAZAC) 180 MG 24 hr capsule TAKE ONE CAPSULE BY MOUTH EVERY NIGHT AT BEDTIME   doxycycline (VIBRA-TABS) 100 MG tablet Take 1 tablet (100 mg total) by mouth daily. For malaria prohylaxis, start 2 days before travel, continue 4 weeks upon return.   losartan-hydrochlorothiazide (HYZAAR) 100-25 MG tablet Take 1 tablet by mouth daily.   potassium chloride (KLOR-CON M) 10 MEQ tablet TAKE ONE TABLET BY MOUTH TWICE A DAY   Vitamin D, Ergocalciferol, 50000 units CAPS Take 50,000 Units by mouth every 30 (thirty) days.   No facility-administered encounter medications on file as of 06/23/2022.    Allergies (verified) Latex, Lisinopril, Neuromuscular blocking agents, Phenazopyridine hcl, Pyridium [phenazopyridine hcl], and Sulfonamide derivatives   History: Past Medical History:  Diagnosis Date   Hypertension    Paget disease of bone    Questionable diagnosis.    Prostate cancer ([facility]    Tricuspid regurgitation    Past Surgical History:  Procedure Laterality Date   BLADDER SURGERY     FINGER GANGLION CYST EXCISION  2011   3rd finger right hand  HYDROCELE EXCISION  2012   PENILE PROSTHESIS IMPLANT     TRIGGER FINGER RELEASE Left 08/2021   thumb   Family History  Problem Relation Age of Onset   Hypertension Sister    Hypertension Brother    Hypertension Mother    Hypertension Brother    Stroke Sister    Hypertension Sister    Colon cancer Neg Hx    Social History   Socioeconomic History   Marital status: Married    Spouse name: Not on file   Number of children: Not on file   Years of  education: Not on file   Highest education level: Not on file  Occupational History   Occupation: professor    Employer: [employer]  Tobacco Use   Smoking status: Never    Passive exposure: Never   Smokeless tobacco: Never  Vaping Use   Vaping Use: Never used  Substance and Sexual Activity   Alcohol use: Yes    Comment: Beer occ   Drug use: No   Sexual activity: Yes    Partners: Female  Other Topics Concern   Not on file  Social History Narrative   Not on file   Social Determinants of Health   Financial Resource Strain: Low Risk  (06/23/2022)   Overall Financial Resource Strain (CARDIA)    Difficulty of Paying Living Expenses: Not hard at all  Food Insecurity: No Food Insecurity (06/23/2022)   Hunger Vital Sign    Worried About Running Out of Food in the Last Year: Never true    [place] in the Last Year: Never true  Transportation Needs: Unknown (06/23/2022)   PRAPARE - Hydrologist (Medical): Not on file    Lack of Transportation (Non-Medical): No  Physical Activity: Sufficiently Active (06/23/2022)   Exercise Vital Sign    Days of Exercise per Week: 5 days    Minutes of Exercise per Session: 60 min  Stress: No Stress Concern Present (06/23/2022)   [place]    Feeling of Stress : Not at all  Social Connections: [place] (06/23/2022)   Social Connection and Isolation Panel [NHANES]    Frequency of Communication with Friends and Family: More than three times a week    Frequency of Social Gatherings with Friends and Family: More than three times a week    Attends Religious Services: More than 4 times per year    Active Member of [employer] or Organizations: Yes    Attends Music therapist: More than 4 times per year    Marital Status: Married    Tobacco Counseling Counseling given: Not Answered   Clinical Intake:  Pre-visit preparation completed:  Yes  Pain : No/denies pain     Nutritional Risks: None Diabetes: No  How often do you need to have someone help you when you read instructions, pamphlets, or other written materials from your doctor or pharmacy?: 1 - Never What is the last grade level you completed in school?: PHD  Diabetic?no   Interpreter Needed?: No  Information entered by :: L.Wilson,LPN   Activities of Daily Living    06/23/2022    2:00 PM 06/23/2022    1:29 PM  In your present state of health, do you have any difficulty performing the following activities:  Hearing? 0 0  Vision? 0 0  Difficulty concentrating or making decisions? 0 0  Walking or climbing stairs? 0 0  Dressing  or bathing? 0 0  Doing errands, shopping? 0 0  Preparing Food and eating ? N N  Using the Toilet? N N  In the past six months, have you accidently leaked urine? N N  Do you have problems with loss of bowel control? N N  Managing your Medications? N N  Managing your Finances? N N  Housekeeping or managing your Housekeeping?  N    Patient Care Team: [name], MD as PCP - General (Family Medicine) Minus Breeding, MD as PCP - Cardiology (Cardiology) Lavonna Monarch, MD as Consulting Physician (Dermatology)  Indicate any recent Medical Services you may have received from other than [facility] providers in the past year (date may be approximate).     Assessment:   This is a routine wellness examination for Jeffrey Perry.  Hearing/Vision screen Vision Screening - Comments:: Annual eye exam wear glasses   Dietary issues and exercise activities discussed: Current Exercise Habits: Home exercise routine, Type of exercise: walking, Time (Minutes): 60, Frequency (Times/Week): 5, Weekly Exercise (Minutes/Week): 300, Intensity: Mild, Exercise limited by: None identified   Goals Addressed   None    Depression Screen    06/23/2022    1:59 PM 06/23/2022    1:57 PM 03/22/2022    1:20 PM 09/21/2021    1:13 PM 03/23/2021    3:03 PM  12/08/2020    3:05 PM 08/08/2020    8:12 AM  PHQ 2/9 Scores  PHQ - 2 Score 0 0 0 0 0 0 0  PHQ- 9 Score   1        Fall Risk    06/23/2022    1:59 PM 05/12/2022   11:29 AM 03/22/2022    1:20 PM 09/21/2021    1:13 PM 03/23/2021    3:03 PM  Fall Risk   Falls in the past year? 1 1 0 0 0  Number falls in past yr: 0 0 0 0   Injury with Fall? 0 1 0 0   Risk for fall due to :  History of fall(s) No Fall Risks No Fall Risks   Follow up Falls evaluation completed;Education provided Falls evaluation completed Falls evaluation completed Falls evaluation completed Falls evaluation completed    [place]:  Any stairs in or around the home? Yes  If so, are there any without handrails? No  Home free of loose throw rugs in walkways, pet beds, electrical cords, etc? Yes  Adequate lighting in your home to reduce risk of falls? No   ASSISTIVE DEVICES UTILIZED TO PREVENT FALLS:  Life alert? No  Use of a cane, walker or w/c? No  Grab bars in the bathroom? No  Shower chair or bench in shower? Yes  Elevated toilet seat or a handicapped toilet? Yes    Cognitive Function:  Normal cognitive status assessed by telephone conversation by this Nurse Health Advisor. No abnormalities found.        02/07/2020    8:18 AM  6CIT Screen  What Year? 0 points  What month? 0 points  What time? 0 points  Count back from 20 0 points  Months in reverse 0 points  Repeat phrase 0 points  Total Score 0 points    Immunizations Immunization History  Administered Date(s) Administered   DTaP 03/22/2008   Gamma Globulin 06/08/1994   H1N1 10/03/2004   Hepatitis A 03/29/1996, 06/10/1998   Hepatitis B 03/22/2008, 10/07/2008   IPV 03/18/2003   Meningococcal Conjugate 03/22/2008  PFIZER(Purple Top)SARS-COV-2 Vaccination 01/20/2020, 02/12/2020, 07/30/2020   Pneumococcal Conjugate-13 05/02/2010, 01/31/2019   Pneumococcal Polysaccharide-23 03/04/2014   Pneumococcal-Unspecified  05/02/2010   Tdap 03/22/2008, 03/24/2018   Typhoid Parenteral 05/29/2002   Yellow Fever 03/22/2008   Zoster, Live 03/29/2010    TDAP status: Up to date  Flu Vaccine status: Up to date  Pneumococcal vaccine status: Up to date  Covid-19 vaccine status: Completed vaccines  Qualifies for Shingles Vaccine? Yes   Zostavax completed No   Shingrix Completed?: No.    Education has been provided regarding the importance of this vaccine. Patient has been advised to call insurance company to determine out of pocket expense if they have not yet received this vaccine. Advised may also receive vaccine at local pharmacy or Health Dept. Verbalized acceptance and understanding.  Screening Tests Health Maintenance  Topic Date Due   Zoster Vaccines- Shingrix (1 of 2) Never done   COVID-19 Vaccine (4 - Booster for Pfizer series) 09/24/2020   COLONOSCOPY (Pts 45-2yr Insurance coverage will need to be confirmed)  04/12/2023   TETANUS/TDAP  03/24/2028   Pneumonia Vaccine 76 Years old  Completed   Hepatitis C Screening  Completed   HPV VACCINES  Aged Out   INFLUENZA VACCINE  Discontinued    Health Maintenance  Health Maintenance Due  Topic Date Due   Zoster Vaccines- Shingrix (1 of 2) Never done   COVID-19 Vaccine (4 - Booster for Pfizer series) 09/24/2020    Colorectal cancer screening: Type of screening: Colonoscopy. Completed 04/11/2013. Repeat every 10 years  Lung Cancer Screening: (Low Dose CT Chest recommended if Age 74-80years, 30 pack-year currently smoking OR have quit w/in 15years.) does not qualify.   Lung Cancer Screening Referral: n/a  Additional Screening:  Hepatitis C Screening: does not qualify;   Vision Screening: Recommended annual ophthalmology exams for early detection of glaucoma and other disorders of the eye. Is the patient up to date with their annual eye exam?  Yes  Who is the provider or what is the name of the office in which the patient attends annual eye  exams? Dr.Gould  If pt is not established with a provider, would they like to be referred to a provider to establish care? No .   Dental Screening: Recommended annual dental exams for proper oral hygiene  Community Resource Referral / Chronic Care Management: CRR required this visit?  No   CCM required this visit?  No      Plan:     I have personally reviewed and noted the following in the patient's chart:   Medical and social history Use of alcohol, tobacco or illicit drugs  Current medications and supplements including opioid prescriptions. Patient is not currently taking opioid prescriptions. Functional ability and status Nutritional status Physical activity Advanced directives List of other physicians Hospitalizations, surgeries, and ER visits in previous 12 months Vitals Screenings to include cognitive, depression, and falls Referrals and appointments  In addition, I have reviewed and discussed with patient certain preventive protocols, quality metrics, and best practice recommendations. A written personalized care plan for preventive services as well as general preventive health recommendations were provided to patient.     LDaphane Shepherd LPN   [id number]  Nurse Notes: none

## 2022-06-23 NOTE — Patient Instructions (Signed)
Jeffrey Perry , Thank you for taking time to come for your Medicare Wellness Visit. I appreciate your ongoing commitment to your health goals. Please review the following plan we discussed and let me know if I can assist you in the future.   Screening recommendations/referrals: Colonoscopy: 04/11/2013 Recommended yearly ophthalmology/optometry visit for glaucoma screening and checkup Recommended yearly dental visit for hygiene and checkup  Vaccinations: Influenza vaccine: completed  Pneumococcal vaccine: completed  Tdap vaccine: 03/24/2018 Shingles vaccine: will consider     Advanced directives: yes   Conditions/risks identified: none   Next appointment: none   Preventive Care 41 Years and Older, Male Preventive care refers to lifestyle choices and visits with your health care provider that can promote health and wellness. What does preventive care include? A yearly physical exam. This is also called an annual well check. Dental exams once or twice a year. Routine eye exams. Ask your health care provider how often you should have your eyes checked. Personal lifestyle choices, including: Daily care of your teeth and gums. Regular physical activity. Eating a healthy diet. Avoiding tobacco and drug use. Limiting alcohol use. Practicing safe sex. Taking low doses of aspirin every day. Taking vitamin and mineral supplements as recommended by your health care provider. What happens during an annual well check? The services and screenings done by your health care provider during your annual well check will depend on your age, overall health, lifestyle risk factors, and family history of disease. Counseling  Your health care provider may ask you questions about your: Alcohol use. Tobacco use. Drug use. Emotional well-being. Home and relationship well-being. Sexual activity. Eating habits. History of falls. Memory and ability to understand (cognition). Work and work  Statistician. Screening  You may have the following tests or measurements: Height, weight, and BMI. Blood pressure. Lipid and cholesterol levels. These may be checked every 5 years, or more frequently if you are over 78 years old. Skin check. Lung cancer screening. You may have this screening every year starting at age 67 if you have a 30-pack-year history of smoking and currently smoke or have quit within the past 15 years. Fecal occult blood test (FOBT) of the stool. You may have this test every year starting at age 58. Flexible sigmoidoscopy or colonoscopy. You may have a sigmoidoscopy every 5 years or a colonoscopy every 10 years starting at age 6. Prostate cancer screening. Recommendations will vary depending on your family history and other risks. Hepatitis C blood test. Hepatitis B blood test. Sexually transmitted disease (STD) testing. Diabetes screening. This is done by checking your blood sugar (glucose) after you have not eaten for a while (fasting). You may have this done every 1-3 years. Abdominal aortic aneurysm (AAA) screening. You may need this if you are a current or former smoker. Osteoporosis. You may be screened starting at age 37 if you are at high risk. Talk with your health care provider about your test results, treatment options, and if necessary, the need for more tests. Vaccines  Your health care provider may recommend certain vaccines, such as: Influenza vaccine. This is recommended every year. Tetanus, diphtheria, and acellular pertussis (Tdap, Td) vaccine. You may need a Td booster every 10 years. Zoster vaccine. You may need this after age 76. Pneumococcal 13-valent conjugate (PCV13) vaccine. One dose is recommended after age 23. Pneumococcal polysaccharide (PPSV23) vaccine. One dose is recommended after age 65. Talk to your health care provider about which screenings and vaccines you need and how often you  need them. This information is not intended to replace  advice given to you by your health care provider. Make sure you discuss any questions you have with your health care provider. Document Released: 12/12/2015 Document Revised: 08/04/2016 Document Reviewed: 09/16/2015 Elsevier Interactive Patient Education  2017 [place] Prevention in the Home Falls can cause injuries. They can happen to people of all ages. There are many things you can do to make your home safe and to help prevent falls. What can I do on the outside of my home? Regularly fix the edges of walkways and driveways and fix any cracks. Remove anything that might make you trip as you walk through a door, such as a raised step or threshold. Trim any bushes or trees on the path to your home. Use bright outdoor lighting. Clear any walking paths of anything that might make someone trip, such as rocks or tools. Regularly check to see if handrails are loose or broken. Make sure that both sides of any steps have handrails. Any raised decks and porches should have guardrails on the edges. Have any leaves, snow, or ice cleared regularly. Use sand or salt on walking paths during winter. Clean up any spills in your garage right away. This includes oil or grease spills. What can I do in the bathroom? Use night lights. Install grab bars by the toilet and in the tub and shower. Do not use towel bars as grab bars. Use non-skid mats or decals in the tub or shower. If you need to sit down in the shower, use a plastic, non-slip stool. Keep the floor dry. Clean up any water that spills on the floor as soon as it happens. Remove soap buildup in the tub or shower regularly. Attach bath mats securely with double-sided non-slip rug tape. Do not have throw rugs and other things on the floor that can make you trip. What can I do in the bedroom? Use night lights. Make sure that you have a light by your bed that is easy to reach. Do not use any sheets or blankets that are too big for your bed.  They should not hang down onto the floor. Have a firm chair that has side arms. You can use this for support while you get dressed. Do not have throw rugs and other things on the floor that can make you trip. What can I do in the kitchen? Clean up any spills right away. Avoid walking on wet floors. Keep items that you use a lot in easy-to-reach places. If you need to reach something above you, use a strong step stool that has a grab bar. Keep electrical cords out of the way. Do not use floor polish or wax that makes floors slippery. If you must use wax, use non-skid floor wax. Do not have throw rugs and other things on the floor that can make you trip. What can I do with my stairs? Do not leave any items on the stairs. Make sure that there are handrails on both sides of the stairs and use them. Fix handrails that are broken or loose. Make sure that handrails are as long as the stairways. Check any carpeting to make sure that it is firmly attached to the stairs. Fix any carpet that is loose or worn. Avoid having throw rugs at the top or bottom of the stairs. If you do have throw rugs, attach them to the floor with carpet tape. Make sure that you have a light switch  at the top of the stairs and the bottom of the stairs. If you do not have them, ask someone to add them for you. What else can I do to help prevent falls? Wear shoes that: Do not have high heels. Have rubber bottoms. Are comfortable and fit you well. Are closed at the toe. Do not wear sandals. If you use a stepladder: Make sure that it is fully opened. Do not climb a closed stepladder. Make sure that both sides of the stepladder are locked into place. Ask someone to hold it for you, if possible. Clearly mark and make sure that you can see: Any grab bars or handrails. First and last steps. Where the edge of each step is. Use tools that help you move around (mobility aids) if they are needed. These  include: Canes. Walkers. Scooters. Crutches. Turn on the lights when you go into a dark area. Replace any light bulbs as soon as they burn out. Set up your furniture so you have a clear path. Avoid moving your furniture around. If any of your floors are uneven, fix them. If there are any pets around you, be aware of where they are. Review your medicines with your doctor. Some medicines can make you feel dizzy. This can increase your chance of falling. Ask your doctor what other things that you can do to help prevent falls. This information is not intended to replace advice given to you by your health care provider. Make sure you discuss any questions you have with your health care provider. Document Released: 09/11/2009 Document Revised: 04/22/2016 Document Reviewed: 12/20/2014 Elsevier Interactive Patient Education  2017 [employer].

## 2022-09-16 ENCOUNTER — Encounter (INDEPENDENT_AMBULATORY_CARE_PROVIDER_SITE_OTHER): Payer: Medicare PPO | Admitting: Ophthalmology

## 2022-09-16 DIAGNOSIS — I10 Essential (primary) hypertension: Secondary | ICD-10-CM | POA: Diagnosis not present

## 2022-09-16 DIAGNOSIS — H43813 Vitreous degeneration, bilateral: Secondary | ICD-10-CM | POA: Diagnosis not present

## 2022-09-16 DIAGNOSIS — H318 Other specified disorders of choroid: Secondary | ICD-10-CM

## 2022-09-16 DIAGNOSIS — H35033 Hypertensive retinopathy, bilateral: Secondary | ICD-10-CM

## 2022-09-23 DIAGNOSIS — N183 Chronic kidney disease, stage 3 unspecified: Secondary | ICD-10-CM | POA: Diagnosis not present

## 2022-09-23 DIAGNOSIS — N3040 Irradiation cystitis without hematuria: Secondary | ICD-10-CM | POA: Diagnosis not present

## 2022-09-23 DIAGNOSIS — R809 Proteinuria, unspecified: Secondary | ICD-10-CM | POA: Diagnosis not present

## 2022-09-23 DIAGNOSIS — I129 Hypertensive chronic kidney disease with stage 1 through stage 4 chronic kidney disease, or unspecified chronic kidney disease: Secondary | ICD-10-CM | POA: Diagnosis not present

## 2022-10-05 ENCOUNTER — Other Ambulatory Visit: Payer: Self-pay | Admitting: Family Medicine

## 2022-10-05 DIAGNOSIS — I10 Essential (primary) hypertension: Secondary | ICD-10-CM

## 2022-10-07 DIAGNOSIS — N5236 Erectile dysfunction following interstitial seed therapy: Secondary | ICD-10-CM | POA: Diagnosis not present

## 2022-10-07 DIAGNOSIS — N138 Other obstructive and reflux uropathy: Secondary | ICD-10-CM | POA: Diagnosis not present

## 2022-10-07 DIAGNOSIS — Z8546 Personal history of malignant neoplasm of prostate: Secondary | ICD-10-CM | POA: Diagnosis not present

## 2022-10-07 DIAGNOSIS — N401 Enlarged prostate with lower urinary tract symptoms: Secondary | ICD-10-CM | POA: Diagnosis not present

## 2022-10-14 ENCOUNTER — Encounter (INDEPENDENT_AMBULATORY_CARE_PROVIDER_SITE_OTHER): Payer: Medicare PPO | Admitting: Ophthalmology

## 2022-10-14 DIAGNOSIS — H318 Other specified disorders of choroid: Secondary | ICD-10-CM

## 2022-10-14 DIAGNOSIS — I10 Essential (primary) hypertension: Secondary | ICD-10-CM

## 2022-10-14 DIAGNOSIS — H43813 Vitreous degeneration, bilateral: Secondary | ICD-10-CM | POA: Diagnosis not present

## 2022-10-14 DIAGNOSIS — H35033 Hypertensive retinopathy, bilateral: Secondary | ICD-10-CM

## 2022-10-18 ENCOUNTER — Other Ambulatory Visit: Payer: Self-pay | Admitting: Lab

## 2022-10-18 ENCOUNTER — Other Ambulatory Visit: Payer: Self-pay | Admitting: Family Medicine

## 2022-10-18 DIAGNOSIS — I10 Essential (primary) hypertension: Secondary | ICD-10-CM

## 2022-10-18 MED ORDER — ALFUZOSIN HCL ER 10 MG PO TB24
ORAL_TABLET | ORAL | 1 refills | Status: DC
Start: 2022-10-18 — End: 2022-11-18

## 2022-10-19 DIAGNOSIS — H31002 Unspecified chorioretinal scars, left eye: Secondary | ICD-10-CM | POA: Diagnosis not present

## 2022-10-19 DIAGNOSIS — H2513 Age-related nuclear cataract, bilateral: Secondary | ICD-10-CM | POA: Diagnosis not present

## 2022-11-11 ENCOUNTER — Encounter (INDEPENDENT_AMBULATORY_CARE_PROVIDER_SITE_OTHER): Payer: Medicare PPO | Admitting: Ophthalmology

## 2022-11-11 DIAGNOSIS — H318 Other specified disorders of choroid: Secondary | ICD-10-CM | POA: Diagnosis not present

## 2022-11-11 DIAGNOSIS — H35033 Hypertensive retinopathy, bilateral: Secondary | ICD-10-CM | POA: Diagnosis not present

## 2022-11-11 DIAGNOSIS — H43813 Vitreous degeneration, bilateral: Secondary | ICD-10-CM | POA: Diagnosis not present

## 2022-11-11 DIAGNOSIS — I10 Essential (primary) hypertension: Secondary | ICD-10-CM | POA: Diagnosis not present

## 2022-11-16 DIAGNOSIS — N411 Chronic prostatitis: Secondary | ICD-10-CM | POA: Diagnosis not present

## 2022-11-16 DIAGNOSIS — N410 Acute prostatitis: Secondary | ICD-10-CM | POA: Diagnosis not present

## 2022-11-16 DIAGNOSIS — C61 Malignant neoplasm of prostate: Secondary | ICD-10-CM | POA: Diagnosis not present

## 2022-11-18 ENCOUNTER — Encounter: Payer: Self-pay | Admitting: Family Medicine

## 2022-11-18 ENCOUNTER — Ambulatory Visit: Payer: Medicare PPO | Admitting: Family Medicine

## 2022-11-18 VITALS — BP 122/76 | HR 59 | Temp 97.7°F | Ht 73.0 in | Wt 245.0 lb

## 2022-11-18 DIAGNOSIS — E876 Hypokalemia: Secondary | ICD-10-CM | POA: Diagnosis not present

## 2022-11-18 DIAGNOSIS — R739 Hyperglycemia, unspecified: Secondary | ICD-10-CM

## 2022-11-18 DIAGNOSIS — Z13220 Encounter for screening for lipoid disorders: Secondary | ICD-10-CM | POA: Diagnosis not present

## 2022-11-18 DIAGNOSIS — I10 Essential (primary) hypertension: Secondary | ICD-10-CM | POA: Diagnosis not present

## 2022-11-18 MED ORDER — POTASSIUM CHLORIDE CRYS ER 10 MEQ PO TBCR: 10.0000 meq | EXTENDED_RELEASE_TABLET | Freq: Two times a day (BID) | ORAL | 1 refills | Status: DC

## 2022-11-18 MED ORDER — ALFUZOSIN HCL ER 10 MG PO TB24: ORAL_TABLET | ORAL | 1 refills | Status: DC

## 2022-11-18 MED ORDER — DILTIAZEM HCL ER BEADS 180 MG PO CP24: 180.0000 mg | ORAL_CAPSULE | Freq: Every day | ORAL | 1 refills | Status: DC

## 2022-11-18 MED ORDER — LOSARTAN POTASSIUM-HCTZ 100-25 MG PO TABS: 1.0000 | ORAL_TABLET | Freq: Every day | ORAL | 1 refills | Status: DC

## 2022-11-18 NOTE — Progress Notes (Signed)
Subjective:  Patient ID: Jeffrey Perry, male    DOB: September 27, 1946  Age: 76 y.o. MRN: [id number]  CC:  Chief Complaint  Patient presents with   Hypertension    Pt states his BP was in the 140's and now is coming down and he dont understand why it keeps going up and down     HPI [name] presents for   Hypertension: With CKD Variable readings. Was elevated for awhile - 140's in October, to late November. Better recently. 119/74 this am Cr 1.38 at nephrology.  Has been followed by nephrology, October 26 visit, CKD stage III, presumably due to hypertension and obstructive uropathy.  Creatinine 1.2-1.4.  Elevated A1c at 6.2.  Diet/exercise plan.  ARB for nephro protection - elevated readings, no med changes, option to add agent or change to amlodipine.  Hyzaar 100/25 QD, diltiazem '180mg'$  qd. Potassium once per day. Vit D once per month.  Alfuzosin nightly. Followed by urology. BPH/LUTS with hx of prostate CA.  Possible start of prostatitis last week - started on Cipro.   Watching diet Exercising with walking most mornings.   Lab Results  Component Value Date   CHOL 151 02/21/2017   HDL 34 (L) 02/21/2017   LDLCALC 91 02/21/2017   TRIG 129 02/21/2017   CHOLHDL 4.4 02/21/2017     BP Readings from Last 3 Encounters:  11/18/22 122/76  06/03/22 124/77  05/12/22 126/68   Lab Results  Component Value Date   CREATININE 1.34 (H) 05/25/2022   Prediabetes: glucose nl at recent urology eval - in 80's.  No home readings.   Lab Results  Component Value Date   HGBA1C 6.2 03/22/2022   Wt Readings from Last 3 Encounters:  11/18/22 245 lb (111.1 kg)  06/03/22 242 lb 6.4 oz (110 kg)  05/12/22 239 lb 6.4 oz (108.6 kg)   HM: Covid booster - declines - side effects prior.  Shingrix - declined.  RSV vaccine declined.   History Patient Active Problem List   Diagnosis Date Noted   Tricuspid valve insufficiency 09/25/2020   Palpitations 09/25/2020   CKD (chronic kidney disease),  stage II 09/25/2020   Murmur 07/18/2017   Elevated LFTs    Abdominal pain 04/30/2016   Renal insufficiency 03/05/2013   HTN (hypertension) 03/06/2012   Past Medical History:  Diagnosis Date   Hypertension    [place] disease of bone    Questionable diagnosis.    Pro[facility])    Tricuspid regurgitation    Past Surgical History:  Procedure Laterality Date   BLADDER SURGERY     FINGER GANGLION CYST EXCISION  2011   3rd finger right hand   HYDROCELE EXCISION  2012   PENILE PROSTHESIS IMPLANT     TRIGGER FINGER RELEASE Left 08/2021   thumb   Allergies  Allergen Reactions   Latex Itching   Lisinopril Cough   Neuromuscular Blocking Agents Swelling    Ankle swelling Ankle swelling   Phenazopyridine Hcl Swelling    Ankle swelling   Pyridium [Phenazopyridine Hcl] Swelling    Ankle swelling   Sulfonamide Derivatives Itching   Prior to Admission medications   Medication Sig Start Date End Date Taking? Authorizing Provider  bevacizumab (AVASTIN) 2.5 mg/0.1 mL SOLN intravitreal injection 0.625 mg by Intravitreal route once.   Yes [provider]  ciprofloxacin (CILOXAN) 0.3 % ophthalmic solution  10/09/22  Yes [provider]  ciprofloxacin (CIPRO) 500 MG tablet Take 500 mg by mouth 2 (two) times daily. 11/16/22  Yes [provider]  diltiazem (TIAZAC) 180 MG 24 hr capsule TAKE ONE CAPSULE BY MOUTH EVERY NIGHT AT BEDTIME 10/05/22  Yes [name], MD  losartan-hydrochlorothiazide (HYZAAR) 100-25 MG tablet Take 1 tablet by mouth daily. 04/19/22  Yes [name], MD  potassium chloride (KLOR-CON M) 10 MEQ tablet TAKE ONE TABLET BY MOUTH TWICE A DAY 04/07/22  Yes [name], MD  Vitamin D, Ergocalciferol, 50000 units CAPS Take 50,000 Units by mouth every 30 (thirty) days.   Yes [provider]  alfuzosin (UROXATRAL) 10 MG 24 hr tablet TAKE ONE TABLET BY MOUTH EVERY MORNING WITH BREAKFAST Patient taking differently: TAKE ONE  TABLET BY MOUTH EVERY MORNING WITH BREAKFAST Pt is taking 10/18/22   [name], MD  doxycycline (VIBRA-TABS) 100 MG tablet Take 1 tablet (100 mg total) by mouth daily. For malaria prohylaxis, start 2 days before travel, continue 4 weeks upon return. Patient not taking: Reported on 11/18/2022 05/16/22   [name], MD   Social History   Socioeconomic History   Marital status: Married    Spouse name: Not on file   Number of children: Not on file   Years of education: Not on file   Highest education level: Not on file  Occupational History   Occupation: professor    Employer: [employer]  Tobacco Use   Smoking status: Never    Passive exposure: Never   Smokeless tobacco: Never  Vaping Use   Vaping Use: Never used  Substance and Sexual Activity   Alcohol use: Yes    Comment: Beer occ   Drug use: No   Sexual activity: Yes    Partners: Female  Other Topics Concern   Not on file  Social History Narrative   Not on file   Social Determinants of Health   Financial Resource Strain: Low Risk  (06/23/2022)   Overall Financial Resource Strain (CARDIA)    Difficulty of Paying Living Expenses: Not hard at all  Food Insecurity: No Food Insecurity (06/23/2022)   Hunger Vital Sign    Worried About Running Out of Food in the Last Year: Never true    [place] in the Last Year: Never true  Transportation Needs: Unknown (06/23/2022)   PRAPARE - Hydrologist (Medical): Not on file    Lack of Transportation (Non-Medical): No  Physical Activity: Sufficiently Active (06/23/2022)   Exercise Vital Sign    Days of Exercise per Week: 5 days    Minutes of Exercise per Session: 60 min  Stress: No Stress Concern Present (06/23/2022)   [place]    Feeling of Stress : Not at all  Social Connections: [place] (06/23/2022)   Social Connection and Isolation Panel [NHANES]    Frequency of  Communication with Friends and Family: More than three times a week    Frequency of Social Gatherings with Friends and Family: More than three times a week    Attends Religious Services: More than 4 times per year    Active Member of [employer] or Organizations: Yes    Attends Archivist Meetings: More than 4 times per year    Marital Status: Married  Human resources officer Violence: Not At Risk (06/23/2022)   Humiliation, Afraid, Rape, and Kick questionnaire    Fear of Current or Ex-Partner: No    Emotionally Abused: No    Physically Abused: No    Sexually Abused:  No    Review of Systems  Constitutional:  Negative for fatigue and unexpected weight change.  Eyes:  Negative for visual disturbance.  Respiratory:  Negative for cough, chest tightness and shortness of breath.   Cardiovascular:  Negative for chest pain, palpitations and leg swelling.  Gastrointestinal:  Negative for abdominal pain and blood in stool.  Neurological:  Negative for dizziness, light-headedness and headaches.     Objective:   Vitals:   11/18/22 1438  BP: 122/76  Pulse: (!) 59  Temp: 97.7 F (36.5 C)  SpO2: 98%  Weight: 245 lb (111.1 kg)  Height: '6\' 1"'$  (1.854 m)     Physical Exam Vitals reviewed.  Constitutional:      Appearance: He is well-developed.  HENT:     Head: Normocephalic and atraumatic.  Neck:     Vascular: No carotid bruit or JVD.  Cardiovascular:     Rate and Rhythm: Normal rate and regular rhythm.     Heart sounds: Normal heart sounds. No murmur heard. Pulmonary:     Effort: Pulmonary effort is normal.     Breath sounds: Normal breath sounds. No rales.  Musculoskeletal:     Right lower leg: No edema.     Left lower leg: No edema.  Skin:    General: Skin is warm and dry.  Neurological:     Mental Status: He is alert and oriented to person, place, and time.  Psychiatric:        Mood and Affect: Mood normal.        Assessment & Plan:  Daekwon Beswick is a 76 y.o. male  . Hyperglycemia - Plan: Hemoglobin A1c, Comprehensive metabolic panel, Lipid panel  -A1c at prediabetes range previously.  Check labs.  Watch diet, activity.  Essential hypertension - Plan: alfuzosin (UROXATRAL) 10 MG 24 hr tablet, diltiazem (TIAZAC) 180 MG 24 hr capsule, losartan-hydrochlorothiazide (HYZAAR) 100-25 MG tablet, Lipid panel  -With CKD, elevated readings previously now improving.  Continue same regimen for now with RTC precautions or follow-up with nephrology regarding med changes if readings elevated again.  Hypokalemia - Plan: potassium chloride (KLOR-CON M) 10 MEQ tablet  -Check labs, continue potassium supplement and adjust accordingly based on lab results  Screening for hyperlipidemia - Plan: Comprehensive metabolic panel, Lipid panel   Meds ordered this encounter  Medications   alfuzosin (UROXATRAL) 10 MG 24 hr tablet    Sig: TAKE ONE TABLET BY MOUTH EVERY MORNING WITH BREAKFAST    Dispense:  90 tablet    Refill:  1   diltiazem (TIAZAC) 180 MG 24 hr capsule    Sig: Take 1 capsule (180 mg total) by mouth at bedtime.    Dispense:  90 capsule    Refill:  1   losartan-hydrochlorothiazide (HYZAAR) 100-25 MG tablet    Sig: Take 1 tablet by mouth daily.    Dispense:  90 tablet    Refill:  1   potassium chloride (KLOR-CON M) 10 MEQ tablet    Sig: Take 1 tablet (10 mEq total) by mouth 2 (two) times daily.    Dispense:  180 tablet    Refill:  1   Patient Instructions  Thanks for coming in today.  No med changes at this time.  I will check your blood work.  Keep follow-up with specialist as planned and I will see you in 6 months.  If you notice blood pressure is increasing again, I am happy to meet with you or can meet with nephrology to decide  on med changes.  Take care!    Signed,   [name], MD [place] 11/18/22 3:38 PM

## 2022-11-18 NOTE — Patient Instructions (Signed)
Thanks for coming in today.  No med changes at this time.  I will check your blood work.  Keep follow-up with specialist as planned and I will see you in 6 months.  If you notice blood pressure is increasing again, I am happy to meet with you or can meet with nephrology to decide on med changes.  Take care!

## 2022-11-19 LAB — LIPID PANEL
Cholesterol: 141 mg/dL (ref 0–200)
HDL: 32.9 mg/dL — ABNORMAL LOW (ref >39.00)
LDL Cholesterol: 84 mg/dL (ref 0–99)
NonHDL: 108.23
Total CHOL/HDL Ratio: 4
Triglycerides: 120 mg/dL (ref 0.0–149.0)
VLDL: 24 mg/dL (ref 0.0–40.0)

## 2022-11-19 LAB — COMPREHENSIVE METABOLIC PANEL
ALT: 12 U/L (ref 0–53)
AST: 18 U/L (ref 0–37)
Albumin: 4.2 g/dL (ref 3.5–5.2)
Alkaline Phosphatase: 63 U/L (ref 39–117)
BUN: 23 mg/dL (ref 6–23)
CO2: 29 mEq/L (ref 19–32)
Calcium: 9.1 mg/dL (ref 8.4–10.5)
Chloride: 103 mEq/L (ref 96–112)
Creatinine, Ser: 1.49 mg/dL (ref 0.40–1.50)
GFR: 45.84 mL/min — ABNORMAL LOW (ref >60.00)
Glucose, Bld: 103 mg/dL — ABNORMAL HIGH (ref 70–99)
Potassium: 3.8 mEq/L (ref 3.5–5.1)
Sodium: 140 mEq/L (ref 135–145)
Total Bilirubin: 0.5 mg/dL (ref 0.2–1.2)
Total Protein: 7.3 g/dL (ref 6.0–8.3)

## 2022-11-19 LAB — HEMOGLOBIN A1C: Hgb A1c MFr Bld: 6.1 % (ref 4.6–6.5)

## 2022-11-30 NOTE — Progress Notes (Signed)
Office Visit Note  [name]             Date of Birth: 1946/03/12           MRN: [id number]             PCP: [name], MD Referring: [name], MD Visit Date: 12/08/2022 Occupation: '@GUAROCC'$ @  Subjective:  Swelling on legs  History of Present Illness: Nasiir Monts is a 77 y.o. male with history of follow-up Paget's disease.  He has not experienced any pelvic pain.  He states the left hip joint pain also improved over time.  The left trigger thumb resolved after the surgery.  He states he notices some swelling on his legs towards the end of the day.  The swelling resolves when he wakes up in the morning.  He does not have any discomfort in his ankles.  He is planning to go to [place] in July 2024.    Activities of Daily Living:  Patient reports morning stiffness for 0 minutes.   Patient Denies nocturnal pain.  Difficulty dressing/grooming: Denies Difficulty climbing stairs: Denies Difficulty getting out of chair: Denies Difficulty using hands for taps, buttons, cutlery, and/or writing: Denies  Review of Systems  Constitutional:  Negative for fatigue.  HENT:  Negative for mouth sores and mouth dryness.   Eyes:  Positive for dryness.  Respiratory:  Negative for shortness of breath.   Cardiovascular:  Positive for swelling in legs/feet. Negative for chest pain and palpitations.  Gastrointestinal:  Positive for constipation. Negative for blood in stool and diarrhea.  Endocrine: Negative for increased urination.  Genitourinary:  Negative for involuntary urination.  Musculoskeletal:  Negative for joint pain, gait problem, joint pain, joint swelling, myalgias, muscle weakness, morning stiffness, muscle tenderness and myalgias.  Skin:  Negative for color change, rash and sensitivity to sunlight.  Allergic/Immunologic: Negative for susceptible to infections.  Neurological:  Negative for dizziness and headaches.  Hematological:  Negative for swollen glands.   Psychiatric/Behavioral:  Negative for depressed mood and sleep disturbance. The patient is not nervous/anxious.     PMFS History:  Patient Active Problem List   Diagnosis Date Noted   Tricuspid valve insufficiency 09/25/2020   Palpitations 09/25/2020   CKD (chronic kidney disease), stage II 09/25/2020   Murmur 07/18/2017   Elevated LFTs    Abdominal pain 04/30/2016   Renal insufficiency 03/05/2013   HTN (hypertension) 03/06/2012    Past Medical History:  Diagnosis Date   Hypertension    Paget disease of bone    Questionable diagnosis.    Prostate cancer ([place])    Tricuspid regurgitation     Family History  Problem Relation Age of Onset   Hypertension Sister    Hypertension Brother    Hypertension Mother    Hypertension Brother    Stroke Sister    Hypertension Sister    Colon cancer Neg Hx    Past Surgical History:  Procedure Laterality Date   BLADDER SURGERY     FINGER GANGLION CYST EXCISION  2011   3rd finger right hand   HYDROCELE EXCISION  2012   PENILE PROSTHESIS IMPLANT     TRIGGER FINGER RELEASE Left 08/2021   thumb   Social History   Social History Narrative   Not on file   Immunization History  Administered Date(s) Administered   DTaP 03/22/2008   Gamma Globulin 06/08/1994   H1N1 10/03/2004   Hepatitis A 03/29/1996, 06/10/1998   Hepatitis B 03/22/2008, 10/07/2008  IPV 03/18/2003   Meningococcal Conjugate 03/22/2008   PFIZER(Purple Top)SARS-COV-2 Vaccination 01/20/2020, 02/12/2020, 07/30/2020   Pneumococcal Conjugate-13 05/02/2010, 01/31/2019   Pneumococcal Polysaccharide-23 03/04/2014   Pneumococcal-Unspecified 05/02/2010   Tdap 03/22/2008, 03/24/2018   Typhoid Parenteral 05/29/2002   Yellow Fever 03/22/2008   Zoster, Live 03/29/2010     Objective: Vital Signs: BP 125/77 (BP Location: Left Arm, Patient Position: Sitting, Cuff Size: Large)   Pulse (!) 56   Resp 17   Ht '6\' 1"'$  (1.854 m)   Wt 244 lb 12.8 oz (111 kg)   BMI 32.30 kg/m     Physical Exam Vitals and nursing note reviewed.  Constitutional:      Appearance: He is well-developed.  HENT:     Head: Normocephalic and atraumatic.  Eyes:     Conjunctiva/sclera: Conjunctivae normal.     Pupils: Pupils are equal, round, and reactive to light.  Cardiovascular:     Rate and Rhythm: Normal rate and regular rhythm.     Heart sounds: Normal heart sounds.     Comments: Mild pitting edema was noted over the left lower extremity. Pulmonary:     Effort: Pulmonary effort is normal.     Breath sounds: Normal breath sounds.  Abdominal:     General: Bowel sounds are normal.     Palpations: Abdomen is soft.  Musculoskeletal:     Cervical back: Normal range of motion and neck supple.  Skin:    General: Skin is warm and dry.     Capillary Refill: Capillary refill takes less than 2 seconds.  Neurological:     Mental Status: He is alert and oriented to person, place, and time.  Psychiatric:        Behavior: Behavior normal.      Musculoskeletal Exam: Cervical spine was in good range of motion.  Shoulder joints, elbow joints, wrist joints, MCPs PIPs and DIPs Juengel range of motion with no synovitis.  Hip joints, knee joints, ankles, MTPs and PIPs been good range of motion with no synovitis.  CDAI Exam: CDAI Score: -- Patient Global: --; Provider Global: -- Swollen: --; Tender: -- Joint Exam 12/08/2022   No joint exam has been documented for this visit   There is currently no information documented on the homunculus. Go to the Rheumatology activity and complete the homunculus joint exam.  Investigation: No additional findings.  Imaging: No results found.  Recent Labs: Lab Results  Component Value Date   WBC 4.5 03/23/2021   HGB 12.1 (L) 03/23/2021   PLT 159.0 03/23/2021   NA 138 12/01/2022   K 4.1 12/01/2022   CL 103 12/01/2022   CO2 28 12/01/2022   GLUCOSE 83 12/01/2022   BUN 20 12/01/2022   CREATININE 1.44 (H) 12/01/2022   BILITOT 0.8 12/01/2022    ALKPHOS 63 11/18/2022   AST 20 12/01/2022   ALT 15 12/01/2022   PROT 6.8 12/01/2022   ALBUMIN 4.2 11/18/2022   CALCIUM 8.8 12/01/2022   GFRAA 60 06/04/2021    Speciality Comments: No specialty comments available.  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 - Patient received IV Reclast 5 mg on May 09, 2020.  He had no recurrence of symptoms since the initial treatment.  He denies any pelvic or sacral pain today.  Pain in left hip - patient had a fall  in June 2023 and he landed on his left hip.  He was evaluated by Dr. Nyoka Cowden.  Patient states that the left hip pain is completely resolved now.  Elevated alkaline phosphatase level - alkaline phosphatase has been within normal limits.  November 18, 2022 alkaline phosphatase was 63.  Plan: COMPLETE METABOLIC PANEL WITH GFR  Pedal edema-he notices some swelling towards the end of the day.  He had mild pitting edema.  Compression socks and exercise was advised.  Stage 3a chronic kidney disease ([place]) -his creatinine is mildly elevated at 1.44.  He is followed by Dr. Marval Regal.  Prostate cancer ([place])  Essential hypertension-his blood pressure was normal at 125/77 today.  Orders: Orders Placed This Encounter  Procedures   COMPLETE METABOLIC PANEL WITH GFR   No orders of the defined types were placed in this encounter.    Follow-Up Instructions: Return in about 6 months (around 06/08/2023) for Paget disease.  Patient is planning to go to [place] in July.  If he goes to [place] we will reschedule his appointment.   Bo Merino, MD  Note - This record has been created using Editor, commissioning.  Chart creation errors have been sought, but may not always  have been located. Such creation errors do not reflect on  the  standard of medical care.

## 2022-12-01 ENCOUNTER — Other Ambulatory Visit: Payer: Self-pay | Admitting: *Deleted

## 2022-12-01 DIAGNOSIS — R748 Abnormal levels of other serum enzymes: Secondary | ICD-10-CM

## 2022-12-01 DIAGNOSIS — M889 Osteitis deformans of unspecified bone: Secondary | ICD-10-CM

## 2022-12-02 LAB — COMPLETE METABOLIC PANEL WITH GFR
AG Ratio: 1.4 (calc) (ref 1.0–2.5)
ALT: 15 U/L (ref 9–46)
AST: 20 U/L (ref 10–35)
Albumin: 4 g/dL (ref 3.6–5.1)
Alkaline phosphatase (APISO): 64 U/L (ref 35–144)
BUN/Creatinine Ratio: 14 (calc) (ref 6–22)
BUN: 20 mg/dL (ref 7–25)
CO2: 28 mmol/L (ref 20–32)
Calcium: 8.8 mg/dL (ref 8.6–10.3)
Chloride: 103 mmol/L (ref 98–110)
Creat: 1.44 mg/dL — ABNORMAL HIGH (ref 0.70–1.28)
Globulin: 2.8 g/dL (calc) (ref 1.9–3.7)
Glucose, Bld: 83 mg/dL (ref 65–99)
Potassium: 4.1 mmol/L (ref 3.5–5.3)
Sodium: 138 mmol/L (ref 135–146)
Total Bilirubin: 0.8 mg/dL (ref 0.2–1.2)
Total Protein: 6.8 g/dL (ref 6.1–8.1)
eGFR: 51 mL/min/{1.73_m2} — ABNORMAL LOW (ref >=60)

## 2022-12-02 NOTE — Progress Notes (Signed)
CMP shows mildly elevated creatinine which is stable.  Alkaline phosphatase is normal.  Please forward results to his PCP.

## 2022-12-08 ENCOUNTER — Encounter: Payer: Self-pay | Admitting: Rheumatology

## 2022-12-08 ENCOUNTER — Ambulatory Visit: Payer: Medicare PPO | Attending: Rheumatology | Admitting: Rheumatology

## 2022-12-08 VITALS — BP 125/77 | HR 56 | Resp 17 | Ht 73.0 in | Wt 244.8 lb

## 2022-12-08 DIAGNOSIS — R748 Abnormal levels of other serum enzymes: Secondary | ICD-10-CM

## 2022-12-08 DIAGNOSIS — I10 Essential (primary) hypertension: Secondary | ICD-10-CM | POA: Diagnosis not present

## 2022-12-08 DIAGNOSIS — C61 Malignant neoplasm of prostate: Secondary | ICD-10-CM | POA: Diagnosis not present

## 2022-12-08 DIAGNOSIS — N1831 Chronic kidney disease, stage 3a: Secondary | ICD-10-CM | POA: Diagnosis not present

## 2022-12-08 DIAGNOSIS — M889 Osteitis deformans of unspecified bone: Secondary | ICD-10-CM

## 2022-12-08 DIAGNOSIS — M25552 Pain in left hip: Secondary | ICD-10-CM | POA: Diagnosis not present

## 2022-12-08 DIAGNOSIS — R600 Localized edema: Secondary | ICD-10-CM

## 2022-12-08 DIAGNOSIS — M65312 Trigger thumb, left thumb: Secondary | ICD-10-CM

## 2022-12-09 ENCOUNTER — Encounter (INDEPENDENT_AMBULATORY_CARE_PROVIDER_SITE_OTHER): Payer: Medicare PPO | Admitting: Ophthalmology

## 2022-12-09 DIAGNOSIS — H35033 Hypertensive retinopathy, bilateral: Secondary | ICD-10-CM | POA: Diagnosis not present

## 2022-12-09 DIAGNOSIS — H43813 Vitreous degeneration, bilateral: Secondary | ICD-10-CM

## 2022-12-09 DIAGNOSIS — I10 Essential (primary) hypertension: Secondary | ICD-10-CM | POA: Diagnosis not present

## 2022-12-09 DIAGNOSIS — H318 Other specified disorders of choroid: Secondary | ICD-10-CM | POA: Diagnosis not present

## 2023-01-06 ENCOUNTER — Encounter (INDEPENDENT_AMBULATORY_CARE_PROVIDER_SITE_OTHER): Payer: Medicare PPO | Admitting: Ophthalmology

## 2023-01-06 DIAGNOSIS — I10 Essential (primary) hypertension: Secondary | ICD-10-CM | POA: Diagnosis not present

## 2023-01-06 DIAGNOSIS — H35033 Hypertensive retinopathy, bilateral: Secondary | ICD-10-CM | POA: Diagnosis not present

## 2023-01-06 DIAGNOSIS — H43813 Vitreous degeneration, bilateral: Secondary | ICD-10-CM | POA: Diagnosis not present

## 2023-01-06 DIAGNOSIS — H318 Other specified disorders of choroid: Secondary | ICD-10-CM | POA: Diagnosis not present

## 2023-01-24 ENCOUNTER — Encounter: Payer: Self-pay | Admitting: Cardiology

## 2023-01-26 ENCOUNTER — Other Ambulatory Visit: Payer: Self-pay

## 2023-01-26 ENCOUNTER — Encounter (HOSPITAL_BASED_OUTPATIENT_CLINIC_OR_DEPARTMENT_OTHER): Payer: Self-pay

## 2023-01-26 DIAGNOSIS — Z79899 Other long term (current) drug therapy: Secondary | ICD-10-CM | POA: Diagnosis not present

## 2023-01-26 DIAGNOSIS — Z8546 Personal history of malignant neoplasm of prostate: Secondary | ICD-10-CM | POA: Insufficient documentation

## 2023-01-26 DIAGNOSIS — I10 Essential (primary) hypertension: Secondary | ICD-10-CM | POA: Insufficient documentation

## 2023-01-26 DIAGNOSIS — R7401 Elevation of levels of liver transaminase levels: Secondary | ICD-10-CM | POA: Insufficient documentation

## 2023-01-26 DIAGNOSIS — Z91040 Latex allergy status: Secondary | ICD-10-CM | POA: Diagnosis not present

## 2023-01-26 DIAGNOSIS — K8020 Calculus of gallbladder without cholecystitis without obstruction: Secondary | ICD-10-CM | POA: Diagnosis not present

## 2023-01-26 DIAGNOSIS — R1084 Generalized abdominal pain: Secondary | ICD-10-CM | POA: Diagnosis present

## 2023-01-26 LAB — URINALYSIS, ROUTINE W REFLEX MICROSCOPIC
Bilirubin Urine: NEGATIVE
Glucose, UA: NEGATIVE mg/dL
Hgb urine dipstick: NEGATIVE
Ketones, ur: NEGATIVE mg/dL
Leukocytes,Ua: NEGATIVE
Nitrite: NEGATIVE
Protein, ur: NEGATIVE mg/dL
Specific Gravity, Urine: 1.015 (ref 1.005–1.030)
pH: 7 (ref 5.0–8.0)

## 2023-01-26 LAB — LIPASE, BLOOD: Lipase: 42 U/L (ref 11–51)

## 2023-01-26 LAB — CBC
HCT: 39.7 % (ref 39.0–52.0)
Hemoglobin: 13 g/dL (ref 13.0–17.0)
MCH: 22.3 pg — ABNORMAL LOW (ref 26.0–34.0)
MCHC: 32.7 g/dL (ref 30.0–36.0)
MCV: 68.1 fL — ABNORMAL LOW (ref 80.0–100.0)
Platelets: 187 10*3/uL (ref 150–400)
RBC: 5.83 MIL/uL — ABNORMAL HIGH (ref 4.22–5.81)
RDW: 15.8 % — ABNORMAL HIGH (ref 11.5–15.5)
WBC: 5.8 10*3/uL (ref 4.0–10.5)
nRBC: 0 % (ref 0.0–0.2)

## 2023-01-26 LAB — COMPREHENSIVE METABOLIC PANEL
ALT: 126 U/L — ABNORMAL HIGH (ref 0–44)
AST: 234 U/L — ABNORMAL HIGH (ref 15–41)
Albumin: 4.6 g/dL (ref 3.5–5.0)
Alkaline Phosphatase: 88 U/L (ref 38–126)
Anion gap: 9 (ref 5–15)
BUN: 19 mg/dL (ref 8–23)
CO2: 29 mmol/L (ref 22–32)
Calcium: 10 mg/dL (ref 8.9–10.3)
Chloride: 96 mmol/L — ABNORMAL LOW (ref 98–111)
Creatinine, Ser: 1.37 mg/dL — ABNORMAL HIGH (ref 0.61–1.24)
GFR, Estimated: 54 mL/min — ABNORMAL LOW (ref >60)
Glucose, Bld: 99 mg/dL (ref 70–99)
Potassium: 3.5 mmol/L (ref 3.5–5.1)
Sodium: 134 mmol/L — ABNORMAL LOW (ref 135–145)
Total Bilirubin: 0.8 mg/dL (ref 0.3–1.2)
Total Protein: 8.2 g/dL — ABNORMAL HIGH (ref 6.5–8.1)

## 2023-01-26 NOTE — ED Triage Notes (Signed)
Patient here POV from Home.  Endorses Generalized ABD Pain that began this AM after eating a Smoothie.   No N/V/D. No Fever. No Dysuria. No Hematuria.   NAD Noted note during Triage. A&Ox4. GCS 15. Ambulatory.

## 2023-01-27 ENCOUNTER — Emergency Department (HOSPITAL_BASED_OUTPATIENT_CLINIC_OR_DEPARTMENT_OTHER): Payer: Medicare PPO

## 2023-01-27 ENCOUNTER — Emergency Department (HOSPITAL_BASED_OUTPATIENT_CLINIC_OR_DEPARTMENT_OTHER)
Admission: EM | Admit: 2023-01-27 | Discharge: 2023-01-27 | Disposition: A | Discharge status: Home / Self Care | Payer: Medicare PPO | Attending: Emergency Medicine | Admitting: Emergency Medicine

## 2023-01-27 DIAGNOSIS — K8020 Calculus of gallbladder without cholecystitis without obstruction: Secondary | ICD-10-CM

## 2023-01-27 DIAGNOSIS — R109 Unspecified abdominal pain: Secondary | ICD-10-CM | POA: Diagnosis not present

## 2023-01-27 DIAGNOSIS — R1084 Generalized abdominal pain: Secondary | ICD-10-CM

## 2023-01-27 DIAGNOSIS — I700 Atherosclerosis of aorta: Secondary | ICD-10-CM | POA: Diagnosis not present

## 2023-01-27 MED ORDER — IOHEXOL 300 MG/ML  SOLN
100.0000 mL | Freq: Once | INTRAMUSCULAR | Status: AC | PRN
  Administered 2023-01-27: 100 mL via INTRAVENOUS

## 2023-01-27 MED ORDER — ONDANSETRON HCL 4 MG/2ML IJ SOLN
4.0000 mg | Freq: Once | INTRAMUSCULAR | Status: AC
  Administered 2023-01-27: 4 mg via INTRAVENOUS
  Filled 2023-01-27: qty 2

## 2023-01-27 MED ORDER — FENTANYL CITRATE PF 50 MCG/ML IJ SOSY
50.0000 ug | PREFILLED_SYRINGE | Freq: Once | INTRAMUSCULAR | Status: AC
  Administered 2023-01-27: 50 ug via INTRAVENOUS
  Filled 2023-01-27: qty 1

## 2023-01-27 NOTE — ED Provider Notes (Signed)
[facility] Provider Note: Georgena Spurling, MD, FACEP  CSN: [id number] MRN: [id number] ARRIVAL: 01/26/23 at 2218 ROOM: [place]  Abdominal Pain   HISTORY OF PRESENT ILLNESS  01/27/23 12:52 AM Jeffrey Perry is a 77 y.o. male who has had abdominal pain and abdominal distention since yesterday morning.  The pain is diffuse and comes and goes.  It is severe at times.  It is not worse with movement or palpation.  He has had no associated nausea, vomiting or diarrhea.  He cannot further characterize the pain, only that it hurts.  He has tried drinking ginger ale and other fluids throughout the day without relief.  He states he has chronic edema of the left ankle.   Past Medical History:  Diagnosis Date   Hypertension    Paget disease of bone    Questionable diagnosis.    Prostate cancer [facility])    Tricuspid regurgitation     Past Surgical History:  Procedure Laterality Date   BLADDER SURGERY     FINGER GANGLION CYST EXCISION  2011   3rd finger right hand   HYDROCELE EXCISION  2012   PENILE PROSTHESIS IMPLANT     TRIGGER FINGER RELEASE Left 08/2021   thumb    Family History  Problem Relation Age of Onset   Hypertension Sister    Hypertension Brother    Hypertension Mother    Hypertension Brother    Stroke Sister    Hypertension Sister    Colon cancer Neg Hx     Social History   Tobacco Use   Smoking status: Never    Passive exposure: Never   Smokeless tobacco: Never  Vaping Use   Vaping Use: Never used  Substance Use Topics   Alcohol use: Yes    Comment: Beer occ   Drug use: No    Prior to Admission medications   Medication Sig Start Date End Date Taking? Authorizing Provider  alfuzosin (UROXATRAL) 10 MG 24 hr tablet TAKE ONE TABLET BY MOUTH EVERY MORNING WITH BREAKFAST 11/18/22   [name], MD  bevacizumab (AVASTIN) 2.5 mg/0.1 mL SOLN intravitreal injection 0.625 mg by Intravitreal route once.    [provider]   ciprofloxacin (CILOXAN) 0.3 % ophthalmic solution  10/09/22   [provider]  diltiazem (TIAZAC) 180 MG 24 hr capsule Take 1 capsule (180 mg total) by mouth at bedtime. 11/18/22   [name], MD  losartan-hydrochlorothiazide (HYZAAR) 100-25 MG tablet Take 1 tablet by mouth daily. 11/18/22   [name], MD  potassium chloride (KLOR-CON M) 10 MEQ tablet Take 1 tablet (10 mEq total) by mouth 2 (two) times daily. 11/18/22   [name], MD  Vitamin D, Ergocalciferol, 50000 units CAPS Take 50,000 Units by mouth every 30 (thirty) days.    [provider]    Allergies Latex, Lisinopril, Neuromuscular blocking agents, Phenazopyridine hcl, Pyridium [phenazopyridine hcl], and Sulfonamide derivatives   REVIEW OF SYSTEMS  Negative except as noted here or in the History of Present Illness.   PHYSICAL EXAMINATION  Initial Vital Signs Blood pressure 132/76, pulse (!) 56, temperature 98.1 F (36.7 C), resp. rate 17, height '6\' 1"'$  (1.854 m), weight 107 kg, SpO2 100 %.  Examination General: Well-developed, well-nourished male in no acute distress; appearance consistent with age of record HENT: normocephalic; atraumatic Eyes: Normal appearance Neck: supple Heart: regular rate and rhythm Lungs: clear to auscultation bilaterally Abdomen: soft; distended; nontender; bowel sounds present Extremities: No deformity;  full range of motion; edema of left ankle Neurologic: Awake, alert and oriented; motor function intact in all extremities and symmetric; no facial droop Skin: Warm and dry Psychiatric: Normal mood and affect   RESULTS  Summary of this visit's results, reviewed and interpreted by myself:   EKG Interpretation  Date/Time:    Ventricular Rate:    PR Interval:    QRS Duration:   QT Interval:    QTC Calculation:   R Axis:     Text Interpretation:         Laboratory Studies: Results for orders placed or performed during the hospital encounter of  01/27/23 (from the past 24 hour(s))  Lipase, blood     Status: None   Collection Time: 01/26/23 10:42 PM  Result Value Ref Range   Lipase 42 11 - 51 U/L  Comprehensive metabolic panel     Status: Abnormal   Collection Time: 01/26/23 10:42 PM  Result Value Ref Range   Sodium 134 (L) 135 - 145 mmol/L   Potassium 3.5 3.5 - 5.1 mmol/L   Chloride 96 (L) 98 - 111 mmol/L   CO2 29 22 - 32 mmol/L   Glucose, Bld 99 70 - 99 mg/dL   BUN 19 8 - 23 mg/dL   Creatinine, Ser 1.37 (H) 0.61 - 1.24 mg/dL   Calcium 10.0 8.9 - 10.3 mg/dL   Total Protein 8.2 (H) 6.5 - 8.1 g/dL   Albumin 4.6 3.5 - 5.0 g/dL   AST 234 (H) 15 - 41 U/L   ALT 126 (H) 0 - 44 U/L   Alkaline Phosphatase 88 38 - 126 U/L   Total Bilirubin 0.8 0.3 - 1.2 mg/dL   GFR, Estimated 54 (L) >60 mL/min   Anion gap 9 5 - 15  CBC     Status: Abnormal   Collection Time: 01/26/23 10:42 PM  Result Value Ref Range   WBC 5.8 4.0 - 10.5 K/uL   RBC 5.83 (H) 4.22 - 5.81 MIL/uL   Hemoglobin 13.0 13.0 - 17.0 g/dL   HCT 39.7 39.0 - 52.0 %   MCV 68.1 (L) 80.0 - 100.0 fL   MCH 22.3 (L) 26.0 - 34.0 pg   MCHC 32.7 30.0 - 36.0 g/dL   RDW 15.8 (H) 11.5 - 15.5 %   Platelets 187 150 - 400 K/uL   nRBC 0.0 0.0 - 0.2 %  Urinalysis, Routine w reflex microscopic -Urine, Clean Catch     Status: None   Collection Time: 01/26/23 10:43 PM  Result Value Ref Range   Color, Urine YELLOW YELLOW   APPearance CLEAR CLEAR   Specific Gravity, Urine 1.015 1.005 - 1.030   pH 7.0 5.0 - 8.0   Glucose, UA NEGATIVE NEGATIVE mg/dL   Hgb urine dipstick NEGATIVE NEGATIVE   Bilirubin Urine NEGATIVE NEGATIVE   Ketones, ur NEGATIVE NEGATIVE mg/dL   Protein, ur NEGATIVE NEGATIVE mg/dL   Nitrite NEGATIVE NEGATIVE   Leukocytes,Ua NEGATIVE NEGATIVE   Imaging Studies: CT ABDOMEN PELVIS W CONTRAST  Result Date: 01/27/2023 CLINICAL DATA:  Abdominal pain. EXAM: CT ABDOMEN AND PELVIS WITH CONTRAST TECHNIQUE: Multidetector CT imaging of the abdomen and pelvis was performed using  the standard protocol following bolus administration of intravenous contrast. RADIATION DOSE REDUCTION: This exam was performed according to the departmental dose-optimization program which includes automated exposure control, adjustment of the mA and/or kV according to patient size and/or use of iterative reconstruction technique. CONTRAST:  152m OMNIPAQUE IOHEXOL 300 MG/ML  SOLN COMPARISON:  April 29, 2016 FINDINGS: Lower chest: No acute abnormality. Hepatobiliary: No focal liver abnormality is seen. A few tiny gallstones are seen within the dependent portion of a moderately distended gallbladder lumen. There is no evidence of gallbladder wall thickening, pericholecystic inflammation or biliary dilatation. Pancreas: Unremarkable. No pancreatic ductal dilatation or surrounding inflammatory changes. Spleen: Normal in size without focal abnormality. Adrenals/Urinary Tract: Adrenal glands are unremarkable. Kidneys are normal, without renal calculi, focal lesion, or hydronephrosis. Urinary bladder is markedly distended and otherwise unremarkable. Stomach/Bowel: Stomach is within normal limits. Appendix appears normal. A stable 3.0 cm x 2.7 cm duodenal diverticulum is noted. No evidence of bowel wall thickening, distention, or inflammatory changes. Noninflamed diverticula are seen throughout the descending and sigmoid colon. Vascular/Lymphatic: Aortic atherosclerosis. No enlarged abdominal or pelvic lymph nodes. Reproductive: There is mild prostatomegaly with metallic density markers seen along the base of the prostate gland. A penile pump is seen with a partially decompressed reservoir noted of adjacent to the lateral aspect of the urinary bladder on the left. Other: No abdominal wall hernia or abnormality. No abdominopelvic ascites. Musculoskeletal: Mixed lytic and densely sclerotic areas are seen throughout the pelvis. No acute osseous abnormalities are identified. IMPRESSION: 1. Cholelithiasis. 2. Colonic  diverticulosis. 3. Stable duodenal diverticulum. 4. Mixed lytic and densely sclerotic areas throughout the pelvis which may represent sequelae associated with Paget's disease. 5. Penile pump with a partially decompressed reservoir noted adjacent to the lateral aspect of the urinary bladder on the left. 6. Aortic atherosclerosis. Aortic Atherosclerosis (ICD10-I70.0). Electronically Signed   By: Virgina Norfolk M.D.   On: 01/27/2023 02:31    ED COURSE and MDM  Nursing notes, initial and subsequent vitals signs, including pulse oximetry, reviewed and interpreted by myself.  Vitals:   01/27/23 0115 01/27/23 0230 01/27/23 0315 01/27/23 0330  BP:  (!) 156/81 (!) 144/75 (!) 141/74  Pulse: (!) 57  (!) 55 60  Resp:      Temp:      SpO2: 97%  94% 94%  Weight:      Height:       Medications  iohexol (OMNIPAQUE) 300 MG/ML solution 100 mL (100 mLs Intravenous Contrast Given 01/27/23 0144)  ondansetron (ZOFRAN) injection 4 mg (4 mg Intravenous Given 01/27/23 0305)  fentaNYL (SUBLIMAZE) injection 50 mcg (50 mcg Intravenous Given 01/27/23 0305)   3:47 AM Patient feeling better.  He was advised of reassuring CT scan.  His abdomen is still soft and nontender.  The cause of his bloating is unclear as there is not an excessive gas or stool seen on his CT scan.  Although he does have some small gallstones there is no evidence for cholecystitis and he has no epigastric or right upper quadrant tenderness or focal pain.  His LFTs are somewhat elevated.  He has a similar presentation in 2017 for which he was hospitalized but no specific cause was found.  He was advised to follow-up with his primary care physician, Dr. Carlota Raspberry, or return if symptoms are worsening.   PROCEDURES  Procedures   ED DIAGNOSES     ICD-10-CM   1. Generalized abdominal pain  R10.84     2. Calculus of gallbladder without cholecystitis without obstruction  K80.20          Derrell Milanes, MD 01/27/23 [phone]

## 2023-01-28 ENCOUNTER — Other Ambulatory Visit (HOSPITAL_BASED_OUTPATIENT_CLINIC_OR_DEPARTMENT_OTHER): Payer: Self-pay

## 2023-01-28 ENCOUNTER — Telehealth: Payer: Self-pay | Admitting: Family Medicine

## 2023-01-28 ENCOUNTER — Encounter (HOSPITAL_BASED_OUTPATIENT_CLINIC_OR_DEPARTMENT_OTHER): Payer: Self-pay | Admitting: Emergency Medicine

## 2023-01-28 ENCOUNTER — Emergency Department (HOSPITAL_BASED_OUTPATIENT_CLINIC_OR_DEPARTMENT_OTHER)
Admission: EM | Admit: 2023-01-28 | Discharge: 2023-01-28 | Disposition: A | Discharge status: Home / Self Care | Payer: Medicare PPO | Attending: Emergency Medicine | Admitting: Emergency Medicine

## 2023-01-28 ENCOUNTER — Other Ambulatory Visit: Payer: Self-pay

## 2023-01-28 DIAGNOSIS — Z1152 Encounter for screening for COVID-19: Secondary | ICD-10-CM | POA: Diagnosis not present

## 2023-01-28 DIAGNOSIS — B349 Viral infection, unspecified: Secondary | ICD-10-CM

## 2023-01-28 DIAGNOSIS — Z91040 Latex allergy status: Secondary | ICD-10-CM | POA: Diagnosis not present

## 2023-01-28 DIAGNOSIS — R509 Fever, unspecified: Secondary | ICD-10-CM | POA: Diagnosis present

## 2023-01-28 DIAGNOSIS — R109 Unspecified abdominal pain: Secondary | ICD-10-CM | POA: Diagnosis not present

## 2023-01-28 LAB — RESP PANEL BY RT-PCR (RSV, FLU A&B, COVID)  RVPGX2
Influenza A by PCR: NEGATIVE
Influenza B by PCR: NEGATIVE
Resp Syncytial Virus by PCR: NEGATIVE
SARS Coronavirus 2 by RT PCR: NEGATIVE

## 2023-01-28 NOTE — Discharge Instructions (Signed)
Please follow-up with your primary care provider in the next few days to be reevaluated.  You may take Tylenol every 6 hours as needed for symptoms.  Please take him plenty of fluids and eat a bland diet.  If this is a viral illness may last up to 7 to 10 days.  If symptoms worsen please return to the ER.

## 2023-01-28 NOTE — Telephone Encounter (Signed)
Noted recent ER visit yesterday with elevated LFTs, notation from ED provider for primary care follow-up.  Abdomen soft, nontender based on that exam, some bloating symptoms.  CT was ordered indicating cholelithiasis, colonic diverticulosis and stable duodenal diverticulum.  Sclerotic areas throughout pelvis thought to represent sequela with Paget's disease.  Penile pump noted with partially decompressed reservoir noted adjacent to the urinary bladder and aortic atherosclerosis but no acute abnormalities identified.  Please schedule visit with me on Monday if possible for ER follow-up and repeat liver tests.  If any worsening symptoms over the weekend may need to be seen through urgent care or ER again.

## 2023-01-28 NOTE — ED Notes (Signed)
Discharge instructions and follow up care reviewed and explained, pt verbalized understanding and had no further questions on d/c. Pt caox4, ambulatory, NAD on d/c.

## 2023-01-28 NOTE — ED Provider Notes (Addendum)
[facility] Provider Note   CSN: [id number] Arrival date & time: 01/28/23  [id number]     History  Chief Complaint  Patient presents with   Abdominal Pain    Jeffrey Perry is a 77 y.o. male presented after feeling unsteady and being warm to the touch since yesterday.  Wife states she had given him a chair for him to sit when he brushes teeth yesterday.  Patient was just seen 2 days ago and diagnosed with cholelithiasis with primary care follow-up.  Wife states patient felt warm to the touch but did not get a temperature.  Wife states she has been giving him Tylenol and that has been helping with symptoms.  Patient is able to tolerate secretions.  Patient denied chest pain, shortness of breath, dizziness, abdominal pain, syncope, neck stiffness, feeling altered  Home Medications Prior to Admission medications   Medication Sig Start Date End Date Taking? Authorizing Provider  alfuzosin (UROXATRAL) 10 MG 24 hr tablet TAKE ONE TABLET BY MOUTH EVERY MORNING WITH BREAKFAST 11/18/22   [name], MD  bevacizumab (AVASTIN) 2.5 mg/0.1 mL SOLN intravitreal injection 0.625 mg by Intravitreal route once.    [provider]  ciprofloxacin (CILOXAN) 0.3 % ophthalmic solution  10/09/22   [provider]  diltiazem (TIAZAC) 180 MG 24 hr capsule Take 1 capsule (180 mg total) by mouth at bedtime. 11/18/22   [name], MD  losartan-hydrochlorothiazide (HYZAAR) 100-25 MG tablet Take 1 tablet by mouth daily. 11/18/22   [name], MD  potassium chloride (KLOR-CON M) 10 MEQ tablet Take 1 tablet (10 mEq total) by mouth 2 (two) times daily. 11/18/22   [name], MD  Vitamin D, Ergocalciferol, 50000 units CAPS Take 50,000 Units by mouth every 30 (thirty) days.    [provider]      Allergies    Latex, Lisinopril, Neuromuscular blocking agents, Phenazopyridine hcl, Pyridium [phenazopyridine hcl], and Sulfonamide  derivatives    Review of Systems   Review of Systems  Gastrointestinal:  Positive for abdominal pain.  See HPI  Physical Exam Updated Vital Signs BP 113/72   Pulse 63   Temp 98.8 F (37.1 C)   Resp 18   SpO2 96%  Physical Exam Vitals and nursing note reviewed.  Constitutional:      General: He is not in acute distress.    Appearance: He is well-developed.  HENT:     Head: Normocephalic and atraumatic.  Eyes:     Extraocular Movements: Extraocular movements intact.     Conjunctiva/sclera: Conjunctivae normal.     Pupils: Pupils are equal, round, and reactive to light.  Cardiovascular:     Rate and Rhythm: Normal rate and regular rhythm.     Heart sounds: No murmur heard. Pulmonary:     Effort: Pulmonary effort is normal. No respiratory distress.     Breath sounds: Normal breath sounds.  Abdominal:     Palpations: Abdomen is soft.     Tenderness: There is no abdominal tenderness. There is no guarding or rebound.     Comments: Negative Murphy's sign  Musculoskeletal:        General: No swelling.     Cervical back: Normal range of motion and neck supple.     Comments: Patient able to bear weight Full active range of motion in upper and lower extremities, neck  Skin:    General: Skin is warm and dry.     Capillary Refill: Capillary  refill takes less than 2 seconds.  Neurological:     General: No focal deficit present.     Mental Status: He is alert and oriented to person, place, and time.     Comments: Patient able to ambulate Sensation intact in all 4 limbs  Psychiatric:        Mood and Affect: Mood normal.     ED Results / Procedures / Treatments   Labs (all labs ordered are listed, but only abnormal results are displayed) Labs Reviewed  RESP PANEL BY RT-PCR (RSV, FLU A&B, COVID)  [id number]    EKG None  Radiology CT ABDOMEN PELVIS W CONTRAST  Result Date: 01/27/2023 CLINICAL DATA:  Abdominal pain. EXAM: CT ABDOMEN AND PELVIS WITH CONTRAST TECHNIQUE:  Multidetector CT imaging of the abdomen and pelvis was performed using the standard protocol following bolus administration of intravenous contrast. RADIATION DOSE REDUCTION: This exam was performed according to the departmental dose-optimization program which includes automated exposure control, adjustment of the mA and/or kV according to patient size and/or use of iterative reconstruction technique. CONTRAST:  19m OMNIPAQUE IOHEXOL 300 MG/ML  SOLN COMPARISON:  April 29, 2016 FINDINGS: Lower chest: No acute abnormality. Hepatobiliary: No focal liver abnormality is seen. A few tiny gallstones are seen within the dependent portion of a moderately distended gallbladder lumen. There is no evidence of gallbladder wall thickening, pericholecystic inflammation or biliary dilatation. Pancreas: Unremarkable. No pancreatic ductal dilatation or surrounding inflammatory changes. Spleen: Normal in size without focal abnormality. Adrenals/Urinary Tract: Adrenal glands are unremarkable. Kidneys are normal, without renal calculi, focal lesion, or hydronephrosis. Urinary bladder is markedly distended and otherwise unremarkable. Stomach/Bowel: Stomach is within normal limits. Appendix appears normal. A stable 3.0 cm x 2.7 cm duodenal diverticulum is noted. No evidence of bowel wall thickening, distention, or inflammatory changes. Noninflamed diverticula are seen throughout the descending and sigmoid colon. Vascular/Lymphatic: Aortic atherosclerosis. No enlarged abdominal or pelvic lymph nodes. Reproductive: There is mild prostatomegaly with metallic density markers seen along the base of the prostate gland. A penile pump is seen with a partially decompressed reservoir noted of adjacent to the lateral aspect of the urinary bladder on the left. Other: No abdominal wall hernia or abnormality. No abdominopelvic ascites. Musculoskeletal: Mixed lytic and densely sclerotic areas are seen throughout the pelvis. No acute osseous  abnormalities are identified. IMPRESSION: 1. Cholelithiasis. 2. Colonic diverticulosis. 3. Stable duodenal diverticulum. 4. Mixed lytic and densely sclerotic areas throughout the pelvis which may represent sequelae associated with Paget's disease. 5. Penile pump with a partially decompressed reservoir noted adjacent to the lateral aspect of the urinary bladder on the left. 6. Aortic atherosclerosis. Aortic Atherosclerosis (ICD10-I70.0). Electronically Signed   By: TVirgina NorfolkM.D.   On: 01/27/2023 02:31    Procedures Procedures    Medications Ordered in ED Medications - No data to display  ED Course/ Medical Decision Making/ A&P                             Medical Decision Making  Jeffrey Perry 77y.o. presented today for abdominal pain and fever. Working DDx that I considered at this time includes, but not limited to, viral illness, cholelithiasis, cholecystitis, pancreatitis, meningitis.  Review of prior external notes:  01/27/2023 ED  Unique Tests and My Interpretation:  Respiratory panel: Negative  Discussion with Independent Historian: Wife  Discussion of Management of Tests: None  Risk: Low:  - based on diagnostic testing/clinical impression and treatment  plan  Risk Stratification Score: None  Staffed with Leanord Asal, DO  R/o DDx: Meningitis: No neck stiffness or altered state Cholecystitis: No Murphy sign or abdominal tenderness  Plan: Patient presented for fever and being unsteady.  On exam patient is stable vitals and did not appear to be in distress.  Patient was able to ambulate and denied any symptoms while in the room.  Patient was just recently seen and had extensive workup done and so today we will do respiratory panel to evaluate for any viral illness possibly causing his symptoms.  Patient stable at this time.  The triage note stated patient had abdominal pain however when I was in the room patient denied any abdominal pain and did not have any  tenderness when being palpated in his abdomen.  Viral panel was negative.  After talking further with the patient and his wife, his wife stated that yesterday patient appeared slightly diaphoretic and patient began endorsing a headache that went around his head in the ED without neurologic symptoms.  I suspect at this time this is most likely a viral illness however I spoke with patient and his wife about if they wanted to do labs even though they had a big workup done 2 days ago and patient denied wanting a workup and stated he would do symptomatic management including Tylenol and taking in food and fluids along with primary care follow-up.  Patient was given return precautions.patient stable for discharge at this time.  Patient verbalized understanding of plan.         Final Clinical Impression(s) / ED Diagnoses Final diagnoses:  Viral illness    Rx / DC Orders ED Discharge Orders     None          Chuck Hint, PA-C 01/28/23 1136    Leanord Asal K, DO 01/28/23 1527

## 2023-01-28 NOTE — ED Triage Notes (Signed)
Pt reports abdominal pain and fever. Pt reports he was dx with gallstones on 01/27/23. Denies N/V.

## 2023-01-28 NOTE — Telephone Encounter (Signed)
Called patient, no answer, Lm for patient to call back and schedule his appt

## 2023-01-30 DIAGNOSIS — D7389 Other diseases of spleen: Secondary | ICD-10-CM | POA: Diagnosis not present

## 2023-01-30 DIAGNOSIS — R748 Abnormal levels of other serum enzymes: Secondary | ICD-10-CM | POA: Diagnosis not present

## 2023-01-30 DIAGNOSIS — K8012 Calculus of gallbladder with acute and chronic cholecystitis without obstruction: Secondary | ICD-10-CM | POA: Diagnosis not present

## 2023-01-30 DIAGNOSIS — Z91040 Latex allergy status: Secondary | ICD-10-CM | POA: Diagnosis not present

## 2023-01-30 DIAGNOSIS — R1011 Right upper quadrant pain: Secondary | ICD-10-CM | POA: Diagnosis not present

## 2023-01-30 DIAGNOSIS — D739 Disease of spleen, unspecified: Secondary | ICD-10-CM | POA: Diagnosis not present

## 2023-01-30 DIAGNOSIS — E871 Hypo-osmolality and hyponatremia: Secondary | ICD-10-CM | POA: Diagnosis not present

## 2023-01-30 DIAGNOSIS — Z882 Allergy status to sulfonamides status: Secondary | ICD-10-CM | POA: Diagnosis not present

## 2023-01-30 DIAGNOSIS — E860 Dehydration: Secondary | ICD-10-CM | POA: Diagnosis not present

## 2023-01-30 DIAGNOSIS — Z923 Personal history of irradiation: Secondary | ICD-10-CM | POA: Diagnosis not present

## 2023-01-30 DIAGNOSIS — I10 Essential (primary) hypertension: Secondary | ICD-10-CM | POA: Diagnosis not present

## 2023-01-30 DIAGNOSIS — R160 Hepatomegaly, not elsewhere classified: Secondary | ICD-10-CM | POA: Diagnosis not present

## 2023-01-30 DIAGNOSIS — K8020 Calculus of gallbladder without cholecystitis without obstruction: Secondary | ICD-10-CM | POA: Diagnosis not present

## 2023-01-30 DIAGNOSIS — Z8546 Personal history of malignant neoplasm of prostate: Secondary | ICD-10-CM | POA: Diagnosis not present

## 2023-01-30 DIAGNOSIS — K829 Disease of gallbladder, unspecified: Secondary | ICD-10-CM | POA: Diagnosis not present

## 2023-02-02 DIAGNOSIS — Z9049 Acquired absence of other specified parts of digestive tract: Secondary | ICD-10-CM | POA: Insufficient documentation

## 2023-02-02 HISTORY — PX: CHOLECYSTECTOMY: SHX55

## 2023-02-03 ENCOUNTER — Encounter (INDEPENDENT_AMBULATORY_CARE_PROVIDER_SITE_OTHER): Payer: Medicare PPO | Admitting: Ophthalmology

## 2023-02-04 ENCOUNTER — Telehealth: Payer: Self-pay

## 2023-02-04 NOTE — Transitions of Care (Post Inpatient/ED Visit) (Signed)
   02/04/2023  Name: Demonta Wombles MRN: [id number] DOB: 1946/05/03  Today's TOC FU Call Status: Today's TOC FU Call Status:: Unsuccessul Call (1st Attempt) Unsuccessful Call (1st Attempt) Date: 02/04/23  Attempted to reach the patient regarding the most recent Inpatient/ED visit.  Follow Up Plan: Additional outreach attempts will be made to reach the patient to complete the Transitions of Care (Post Inpatient/ED visit) call.     Enzo Montgomery, RN,BSN,CCM [facility] Care Management Care Management Community Coordinator Direct Phone: [phone] Toll Free: [phone] Fax: [phone]

## 2023-02-07 ENCOUNTER — Telehealth: Payer: Self-pay

## 2023-02-07 ENCOUNTER — Encounter (INDEPENDENT_AMBULATORY_CARE_PROVIDER_SITE_OTHER): Payer: Medicare PPO | Admitting: Ophthalmology

## 2023-02-07 DIAGNOSIS — H318 Other specified disorders of choroid: Secondary | ICD-10-CM | POA: Diagnosis not present

## 2023-02-07 DIAGNOSIS — H35033 Hypertensive retinopathy, bilateral: Secondary | ICD-10-CM | POA: Diagnosis not present

## 2023-02-07 DIAGNOSIS — I10 Essential (primary) hypertension: Secondary | ICD-10-CM | POA: Diagnosis not present

## 2023-02-07 DIAGNOSIS — H43813 Vitreous degeneration, bilateral: Secondary | ICD-10-CM

## 2023-02-07 NOTE — Transitions of Care (Post Inpatient/ED Visit) (Signed)
   02/07/2023  Name: Keyandre Pileggi MRN: [id number] DOB: February 20, 1946  Today's TOC FU Call Status: Today's TOC FU Call Status:: Unsuccessful Call (2nd Attempt) Unsuccessful Call (2nd Attempt) Date: 02/07/23  Attempted to reach the patient regarding the most recent Inpatient/ED visit.  Follow Up Plan: Additional outreach attempts will be made to reach the patient to complete the Transitions of Care (Post Inpatient/ED visit) call.     Enzo Montgomery, RN,BSN,CCM [facility] Care Management Care Management Community Coordinator Direct Phone: [phone] Toll Free: [phone] Fax: [phone]

## 2023-02-07 NOTE — Transitions of Care (Post Inpatient/ED Visit) (Signed)
   02/07/2023  Name: Jeffrey Perry MRN: [id number] DOB: [birth year]  Today's TOC FU Call Status: Today's TOC FU Call Status:: Unsuccessful Call (3rd Attempt) Unsuccessful Call (3rd Attempt) Date: 02/07/23  Attempted to reach the patient regarding the most recent Inpatient/ED visit.  Follow Up Plan: No further outreach attempts will be made at this time. We have been unable to contact the patient.    Enzo Montgomery, RN,BSN,CCM [facility] Care Management Care Management Community Coordinator Direct Phone: [phone] Toll Free: [phone] Fax: [phone]

## 2023-02-09 ENCOUNTER — Ambulatory Visit: Payer: Medicare PPO | Admitting: Family Medicine

## 2023-02-09 ENCOUNTER — Encounter: Payer: Self-pay | Admitting: Family Medicine

## 2023-02-09 VITALS — BP 126/70 | HR 92 | Temp 97.8°F | Ht 73.0 in | Wt 231.8 lb

## 2023-02-09 DIAGNOSIS — D7389 Other diseases of spleen: Secondary | ICD-10-CM | POA: Diagnosis not present

## 2023-02-09 DIAGNOSIS — R7989 Other specified abnormal findings of blood chemistry: Secondary | ICD-10-CM

## 2023-02-09 DIAGNOSIS — Z1211 Encounter for screening for malignant neoplasm of colon: Secondary | ICD-10-CM

## 2023-02-09 DIAGNOSIS — Z9049 Acquired absence of other specified parts of digestive tract: Secondary | ICD-10-CM

## 2023-02-09 NOTE — Progress Notes (Signed)
Subjective:  Patient ID: Jeffrey Perry, male    DOB: February 29, 1946  Age: 77 y.o. MRN: [id number]  CC:  Chief Complaint  Patient presents with   Post-op Follow-up    Pt was given Oxycodone 5 mg and reports tried taking it and started itching all over and switched to tylenol, has been taking that 2 days seems to be doing okay at this time    Referral    Pt will be due in May was happy to take referral to get this done on time    HPI [name] presents for   Hospital follow-up: Admitted to [facility] March 3 through March 7 with initial right upper quadrant pain.  Initially started 6 days prior. Seen in ER 2/28, with initial CT, elevated LFTS, planned outpatient follow up. Seen in ER again 3/1- with chills, fever. Suspected possible viral illness. Initial CT indicated cholelithiasis without inflammation but increased LFTs.  Initially discharged without intervention, then had fever, chills at home.   Seen at [facility] with repeat CT 01/30/23,  IMPRESSION:  1. Minor pericholecystic fat stranding and possible stone could be due to  cholecystitis. Clinical correlation is suggested. Consider gallbladder  ultrasound as clinically warranted.  2. Other incidental findings.  Consulted GI on March 4 for possible choledocholithiasis.  MRCP was negative 3/5.  Laparoscopic cholecystectomy on March 6.  No complications.   Uneventful postoperative stay.  Pain controlled initially with IV analgesia and then transitioned to p.o pain meds.  MRCP did indicate incidental splenic lesion but benign appearance and plan for 24-monthMRI for recheck.  1.7cm splenic lesion. Possible plan for [facility] primary care provider. Outside labs reviewed.  [place] 133, creatinine 1.3.  CBC with WBC 9.1, hemoglobin 12.1.  Most recent hepatic function panel on 02/03/2023.AST 152, ALT 233, improving from 02/02/2023 with AST 225, ALT 274  Discharged on 02/03/23. Discharged on oxycodone for pain control - had itching after  doses - took 3 doses - more itching after 3rd dose. Stopped oxycodone - last dose 4 days ago.  Benadryl for itching- has improved.  Treating pain with tylenol - sufficient for pain control. None needed past 2 days.  Abd pain has improved.  Still minimal upper/epigastric. No scleral icterus or dark urine.  Appetite is slowly improving. Drinking fluids, urinating ok.  Last BM - today - soft stool. Taking senokot.  No n/v.   History Patient Active Problem List   Diagnosis Date Noted   Tricuspid valve insufficiency 09/25/2020   Palpitations 09/25/2020   CKD (chronic kidney disease), stage II 09/25/2020   Murmur 07/18/2017   Elevated LFTs    Abdominal pain 04/30/2016   Renal insufficiency 03/05/2013   HTN (hypertension) 03/06/2012   Past Medical History:  Diagnosis Date   Hypertension    Paget disease of bone    Questionable diagnosis.    Prostate cancer ([facility]    Tricuspid regurgitation    Past Surgical History:  Procedure Laterality Date   BLADDER SURGERY     FINGER GANGLION CYST EXCISION  2011   3rd finger right hand   HYDROCELE EXCISION  2012   PENILE PROSTHESIS IMPLANT     TRIGGER FINGER RELEASE Left 08/2021   thumb   Allergies  Allergen Reactions   Latex Itching   Lisinopril Cough   Neuromuscular Blocking Agents Swelling    Ankle swelling Ankle swelling   Phenazopyridine Hcl Swelling    Ankle swelling   Pyridium [Phenazopyridine Hcl] Swelling  Ankle swelling   Sulfonamide Derivatives Itching   Prior to Admission medications   Medication Sig Start Date End Date Taking? Authorizing Provider  alfuzosin (UROXATRAL) 10 MG 24 hr tablet TAKE ONE TABLET BY MOUTH EVERY MORNING WITH BREAKFAST 11/18/22  Yes [name], MD  bevacizumab (AVASTIN) 2.5 mg/0.1 mL SOLN intravitreal injection 0.625 mg by Intravitreal route once.   Yes [provider]  ciprofloxacin (CILOXAN) 0.3 % ophthalmic solution  10/09/22  Yes [provider]  diltiazem (TIAZAC)  180 MG 24 hr capsule Take 1 capsule (180 mg total) by mouth at bedtime. 11/18/22  Yes [name], MD  losartan-hydrochlorothiazide (HYZAAR) 100-25 MG tablet Take 1 tablet by mouth daily. 11/18/22  Yes [name], MD  potassium chloride (KLOR-CON M) 10 MEQ tablet Take 1 tablet (10 mEq total) by mouth 2 (two) times daily. 11/18/22  Yes [name], MD  senna-docusate (SENOKOT-S) 8.6-50 MG tablet Take 1 tablet by mouth 2 (two) times daily. 02/03/23  Yes [provider]  Vitamin D, Ergocalciferol, 50000 units CAPS Take 50,000 Units by mouth every 30 (thirty) days.   Yes [provider]   Social History   Socioeconomic History   Marital status: Married    Spouse name: Not on file   Number of children: Not on file   Years of education: Not on file   Highest education level: Not on file  Occupational History   Occupation: professor    Employer: [employer]  Tobacco Use   Smoking status: Never    Passive exposure: Never   Smokeless tobacco: Never  Vaping Use   Vaping Use: Never used  Substance and Sexual Activity   Alcohol use: Yes    Comment: Beer occ   Drug use: No   Sexual activity: Yes    Partners: Female  Other Topics Concern   Not on file  Social History Narrative   Not on file   Social Determinants of Health   Financial Resource Strain: Low Risk  (06/23/2022)   Overall Financial Resource Strain (CARDIA)    Difficulty of Paying Living Expenses: Not hard at all  Food Insecurity: No Food Insecurity (06/23/2022)   Hunger Vital Sign    Worried About Running Out of Food in the Last Year: Never true    [place] in the Last Year: Never true  Transportation Needs: Unknown (06/23/2022)   PRAPARE - Hydrologist (Medical): Not on file    Lack of Transportation (Non-Medical): No  Physical Activity: Sufficiently Active (06/23/2022)   Exercise Vital Sign    Days of Exercise per Week: 5 days    Minutes of Exercise per  Session: 60 min  Stress: No Stress Concern Present (06/23/2022)   [place]    Feeling of Stress : Not at all  Social Connections: [place] (06/23/2022)   Social Connection and Isolation Panel [NHANES]    Frequency of Communication with Friends and Family: More than three times a week    Frequency of Social Gatherings with Friends and Family: More than three times a week    Attends Religious Services: More than 4 times per year    Active Member of [employer] or Organizations: Yes    Attends Archivist Meetings: More than 4 times per year    Marital Status: Married  Human resources officer Violence: Not At Risk (06/23/2022)   Humiliation, Afraid, Rape, and Kick questionnaire  Fear of Current or Ex-Partner: No    Emotionally Abused: No    Physically Abused: No    Sexually Abused: No    Review of Systems   Objective:   Vitals:   02/09/23 1448  BP: 126/70  Pulse: 92  Temp: 97.8 F (36.6 C)  TempSrc: Temporal  SpO2: 100%  Weight: 231 lb 12.8 oz (105.1 kg)  Height: '6\' 1"'$  (1.854 m)     Physical Exam Vitals reviewed.  Constitutional:      Appearance: He is well-developed.  HENT:     Head: Normocephalic and atraumatic.  Neck:     Vascular: No carotid bruit or JVD.  Cardiovascular:     Rate and Rhythm: Normal rate and regular rhythm.     Heart sounds: Normal heart sounds. No murmur heard. Pulmonary:     Effort: Pulmonary effort is normal.     Breath sounds: Normal breath sounds. No rales.  Abdominal:     Tenderness: There is abdominal tenderness (Minimal epigastric.  Negative Murphy.  Right upper quadrant nontender.  Trocar sites healing with some residual Steri-Strip.  No surrounding erythema or bleeding.  Wounds clean, dry, intact.).  Musculoskeletal:     Right lower leg: No edema.     Left lower leg: No edema.  Skin:    General: Skin is warm and dry.  Neurological:     Mental Status: He is  alert and oriented to person, place, and time.  Psychiatric:        Mood and Affect: Mood normal.      55 minutes spent during visit, including chart review, hospital record review from [facility] as well as ER visits locally,  Counseling and assimilation of information, exam, discussion of plan, and chart completion.   Assessment & Plan:  Philo Glessner is a 77 y.o. male . Screening for colon cancer - Plan: Ambulatory referral to Gastroenterology  History of laparoscopic cholecystectomy - Plan: Ambulatory referral to Gastroenterology, CBC with Differential/Platelet  Splenic lesion - Plan: Ambulatory referral to Gastroenterology  LFT elevation - Plan: Ambulatory referral to Gastroenterology, Comprehensive metabolic panel Abdominal pain, elevated LFTs, now status post cholecystectomy with improvement in pain.  Minimal residual epigastric pain.  No nausea or vomiting.  LFTs were downtrending.  Tolerating fluids.  Pain controlled with intermittent Tylenol, intolerant to oxycodone -added to allergy list.  -Check CBC, CMP to evaluate LFTs.  Caution with Tylenol use if still elevated but pain has been improving, minimal need.  -Referral to gastroenterology to follow-up elevated LFTs, especially if those remain elevated, and to discuss follow-up of splenic lesion, appears to be benign with repeat imaging with MRI in 6 months planned.   No orders of the defined types were placed in this encounter.  Patient Instructions  Thank you for coming in today.  Glad to hear you are improving from recent hospitalization and surgery.  I will check your blood counts, kidney function test as well as a liver test to make sure those are continuing to decline.  Tylenol is fine short-term if needed for pain but with elevated liver test would want to be careful with use of Tylenol.  I will also refer you to gastroenterology for follow-up of the splenic lesion, elevated liver test, and colonoscopy.  If anything changes let me  know. Hang in there!    Signed,   [name], MD [facility] 02/09/23 3:24 PM

## 2023-02-09 NOTE — Patient Instructions (Signed)
Thank you for coming in today.  Glad to hear you are improving from recent hospitalization and surgery.  I will check your blood counts, kidney function test as well as a liver test to make sure those are continuing to decline.  Tylenol is fine short-term if needed for pain but with elevated liver test would want to be careful with use of Tylenol.  I will also refer you to gastroenterology for follow-up of the splenic lesion, elevated liver test, and colonoscopy.  If anything changes let me know. Hang in there!

## 2023-02-10 LAB — COMPREHENSIVE METABOLIC PANEL
ALT: 480 U/L — ABNORMAL HIGH (ref 0–53)
AST: 184 U/L — ABNORMAL HIGH (ref 0–37)
Albumin: 3.8 g/dL (ref 3.5–5.2)
Alkaline Phosphatase: 404 U/L — ABNORMAL HIGH (ref 39–117)
BUN: 21 mg/dL (ref 6–23)
CO2: 27 mEq/L (ref 19–32)
Calcium: 9.2 mg/dL (ref 8.4–10.5)
Chloride: 100 mEq/L (ref 96–112)
Creatinine, Ser: 1.38 mg/dL (ref 0.40–1.50)
GFR: 50.18 mL/min — ABNORMAL LOW (ref >60.00)
Glucose, Bld: 87 mg/dL (ref 70–99)
Potassium: 4.3 mEq/L (ref 3.5–5.1)
Sodium: 133 mEq/L — ABNORMAL LOW (ref 135–145)
Total Bilirubin: 1.3 mg/dL — ABNORMAL HIGH (ref 0.2–1.2)
Total Protein: 7.8 g/dL (ref 6.0–8.3)

## 2023-02-10 LAB — CBC WITH DIFFERENTIAL/PLATELET
Basophils Absolute: 0 10*3/uL (ref 0.0–0.1)
Basophils Relative: 0.8 % (ref 0.0–3.0)
Eosinophils Absolute: 0.2 10*3/uL (ref 0.0–0.7)
Eosinophils Relative: 3 % (ref 0.0–5.0)
HCT: 38.9 % — ABNORMAL LOW (ref 39.0–52.0)
Hemoglobin: 12.5 g/dL — ABNORMAL LOW (ref 13.0–17.0)
Lymphocytes Relative: 32.6 % (ref 12.0–46.0)
Lymphs Abs: 1.9 10*3/uL (ref 0.7–4.0)
MCHC: 32.2 g/dL (ref 30.0–36.0)
MCV: 68.4 fl — ABNORMAL LOW (ref 78.0–100.0)
Monocytes Absolute: 0.6 10*3/uL (ref 0.1–1.0)
Monocytes Relative: 10.3 % (ref 3.0–12.0)
Neutro Abs: 3.1 10*3/uL (ref 1.4–7.7)
Neutrophils Relative %: 53.3 % (ref 43.0–77.0)
Platelets: 327 10*3/uL (ref 150.0–400.0)
RBC: 5.69 Mil/uL (ref 4.22–5.81)
RDW: 15.4 % (ref 11.5–15.5)
WBC: 5.9 10*3/uL (ref 4.0–10.5)

## 2023-02-18 ENCOUNTER — Encounter: Payer: Self-pay | Admitting: Internal Medicine

## 2023-02-21 ENCOUNTER — Ambulatory Visit: Payer: Medicare PPO | Admitting: Family Medicine

## 2023-02-21 ENCOUNTER — Encounter: Payer: Self-pay | Admitting: Family Medicine

## 2023-02-21 VITALS — BP 128/70 | HR 63 | Temp 98.9°F | Ht 73.0 in | Wt 237.0 lb

## 2023-02-21 DIAGNOSIS — E871 Hypo-osmolality and hyponatremia: Secondary | ICD-10-CM

## 2023-02-21 DIAGNOSIS — R7989 Other specified abnormal findings of blood chemistry: Secondary | ICD-10-CM

## 2023-02-21 DIAGNOSIS — R001 Bradycardia, unspecified: Secondary | ICD-10-CM | POA: Diagnosis not present

## 2023-02-21 DIAGNOSIS — Z8739 Personal history of other diseases of the musculoskeletal system and connective tissue: Secondary | ICD-10-CM

## 2023-02-21 DIAGNOSIS — R748 Abnormal levels of other serum enzymes: Secondary | ICD-10-CM | POA: Diagnosis not present

## 2023-02-21 NOTE — Patient Instructions (Signed)
I will repeat liver tests today as well as look at liver virus infection testing.  Prior low sodium was borderline, I am rechecking that as well today.  Depending on results of labs, likely will discuss with gastroenterology or have you see them.  If any new abdominal pain, nausea, vomiting or other new symptoms be seen right away.  If you get another low heart rate reading on your Apple Watch or you do feel new lightheadedness, fatigue, or know a low heart rate, be seen right away.  Heart rate overall sounds and looks okay today.  Thanks for coming today and please let me know if there are questions.

## 2023-02-21 NOTE — Progress Notes (Signed)
Subjective:  Patient ID: Jeffrey Perry, male    DOB: [birth year]  Age: 77 y.o. MRN: [id number]  CC:  Chief Complaint  Patient presents with   Results    Pt here to discuss recent lab work from  02/10/2023    HPI Jeffrey Perry presents for   Elevated LFTs See last visit.  Initially had been seen in the ER at the end of February, elevated LFTs and at that time.  Cholelithiasis without inflammation but increased LFTs.  Ultimately was seen in follow-up at [facility] with repeat CT scan on March 3 after episode of fever and chills at home.  Minor pericholecystic fat stranding and possible stone could be due to cholecystitis.  MRCP was negative on March 5.   Laparoscopic cholecystectomy on March 6 without complications.  LFTs were improving but still elevated March 7.  Improved AST from 225-152, ALT from 274-233. I saw him in follow-up On March 13.  Pain was improving, still minimal upper/epigastric.  No scleral icterus or dark urine at that time and appetite was improving. Referred to GI last visit - called about colonoscopy on 5/2.   Repeat LFTs elevated as below on the 13th, higher than discharge level at [facility]. Since last visit.  Ovarall better, soreness in abdomen is improving. No scleral icterus.  No n/v.  No dark urine.  No alcohol.  No tylenol in past 10 days.  Had reclast infusion in past by rheumatology, alk phos improved. Hx of paget's.  Low HR on Apple watch last week - ? 30's. Some fatigue next morning. Not since. Prior low HR few years.    Prior hep A and B vaccines: Immunization History  Administered Date(s) Administered   DTaP 03/22/2008   Gamma Globulin 06/08/1994   H1N1 10/03/2004   Hepatitis A 03/29/1996, 06/10/1998   Hepatitis B 03/22/2008, 10/07/2008   IPV 03/18/2003   Meningococcal Conjugate 03/22/2008   PFIZER(Purple Top)SARS-COV-2 Vaccination 01/20/2020, 02/12/2020, 07/30/2020   Pneumococcal Conjugate-13 05/02/2010, 01/31/2019   Pneumococcal Polysaccharide-23 03/04/2014    Pneumococcal-Unspecified 05/02/2010   Tdap 03/22/2008, 03/24/2018   Typhoid Parenteral 05/29/2002   Yellow Fever 03/22/2008   Zoster, Live 03/29/2010    History Patient Active Problem List   Diagnosis Date Noted   Tricuspid valve insufficiency 09/25/2020   Palpitations 09/25/2020   CKD (chronic kidney disease), stage II 09/25/2020   Murmur 07/18/2017   Elevated LFTs    Abdominal pain 04/30/2016   Renal insufficiency 03/05/2013   HTN (hypertension) 03/06/2012   Past Medical History:  Diagnosis Date   Hypertension    Paget disease of bone    Questionable diagnosis.    Prostate cancer [facility])    Tricuspid regurgitation    Past Surgical History:  Procedure Laterality Date   BLADDER SURGERY     FINGER GANGLION CYST EXCISION  2011   3rd finger right hand   HYDROCELE EXCISION  2012   PENILE PROSTHESIS IMPLANT     TRIGGER FINGER RELEASE Left 08/2021   thumb   Allergies  Allergen Reactions   Latex Itching   Lisinopril Cough   Neuromuscular Blocking Agents Swelling    Ankle swelling Ankle swelling   Oxycodone Itching   Phenazopyridine Hcl Swelling    Ankle swelling   Pyridium [Phenazopyridine Hcl] Swelling    Ankle swelling   Sulfonamide Derivatives Itching   Prior to Admission medications   Medication Sig Start Date End Date Taking? Authorizing Provider  alfuzosin (UROXATRAL) 10 MG 24 hr tablet TAKE ONE TABLET BY MOUTH  EVERY MORNING WITH BREAKFAST 11/18/22  Yes Shade Flood, MD  bevacizumab (AVASTIN) 2.5 mg/0.1 mL SOLN intravitreal injection 0.625 mg by Intravitreal route once.   Yes [provider]  ciprofloxacin (CILOXAN) 0.3 % ophthalmic solution  10/09/22  Yes [provider]  diltiazem (TIAZAC) 180 MG 24 hr capsule Take 1 capsule (180 mg total) by mouth at bedtime. 11/18/22  Yes Shade Flood, MD  losartan-hydrochlorothiazide (HYZAAR) 100-25 MG tablet Take 1 tablet by mouth daily. 11/18/22  Yes Shade Flood, MD  potassium chloride  (KLOR-CON M) 10 MEQ tablet Take 1 tablet (10 mEq total) by mouth 2 (two) times daily. 11/18/22  Yes Shade Flood, MD  senna-docusate (SENOKOT-S) 8.6-50 MG tablet Take 1 tablet by mouth 2 (two) times daily. 02/03/23  Yes [provider]  Vitamin D, Ergocalciferol, 50000 units CAPS Take 50,000 Units by mouth every 30 (thirty) days.   Yes [provider]   Social History   Socioeconomic History   Marital status: Married    Spouse name: Not on file   Number of children: Not on file   Years of education: Not on file   Highest education level: Doctorate  Occupational History   Occupation: professor    Employer: [employer]  Tobacco Use   Smoking status: Never    Passive exposure: Never   Smokeless tobacco: Never  Vaping Use   Vaping Use: Never used  Substance and Sexual Activity   Alcohol use: Yes    Comment: Beer occ   Drug use: No   Sexual activity: Yes    Partners: Female  Other Topics Concern   Not on file  Social History Narrative   Not on file   Social Determinants of Health   Financial Resource Strain: Low Risk  (02/17/2023)   Overall Financial Resource Strain (CARDIA)    Difficulty of Paying Living Expenses: Not hard at all  Food Insecurity: No Food Insecurity (02/17/2023)   Hunger Vital Sign    Worried About Running Out of Food in the Last Year: Never true    Ran Out of Food in the Last Year: Never true  Transportation Needs: No Transportation Needs (02/17/2023)   PRAPARE - Administrator, Civil Service (Medical): No    Lack of Transportation (Non-Medical): No  Physical Activity: Sufficiently Active (02/17/2023)   Exercise Vital Sign    Days of Exercise per Week: 7 days    Minutes of Exercise per Session: 70 min  Stress: No Stress Concern Present (02/17/2023)   [employer] of Occupational Health - Occupational Stress Questionnaire    Feeling of Stress : Not at all  Social Connections: Socially Integrated (02/17/2023)   Social Connection  and Isolation Panel [NHANES]    Frequency of Communication with Friends and Family: More than three times a week    Frequency of Social Gatherings with Friends and Family: Twice a week    Attends Religious Services: More than 4 times per year    Active Member of [employer] or Organizations: Yes    Attends Banker Meetings: 1 to 4 times per year    Marital Status: Married  Catering manager Violence: Not At Risk (06/23/2022)   Humiliation, Afraid, Rape, and Kick questionnaire    Fear of Current or Ex-Partner: No    Emotionally Abused: No    Physically Abused: No    Sexually Abused: No    Review of Systems Per HPI   Objective:   Vitals:  02/21/23 1423  BP: 128/70  Pulse: 63  Temp: 98.9 F (37.2 C)  TempSrc: Temporal  SpO2: 99%  Weight: 237 lb (107.5 kg)  Height: 6\' 1"  (1.854 m)   Physical Exam Vitals reviewed.  Constitutional:      Appearance: He is well-developed.  HENT:     Head: Normocephalic and atraumatic.  Neck:     Vascular: No carotid bruit or JVD.  Cardiovascular:     Rate and Rhythm: Normal rate and regular rhythm.     Heart sounds: Normal heart sounds. No murmur heard.    Comments: Regular rate. Pulmonary:     Effort: Pulmonary effort is normal.     Breath sounds: Normal breath sounds. No rales.  Abdominal:     Comments: Trocar site wounds healing well without surrounding erythema.  Clean/dry/intact. No focal abdominal tenderness.  Musculoskeletal:     Right lower leg: No edema.     Left lower leg: No edema.  Skin:    General: Skin is warm and dry.  Neurological:     Mental Status: He is alert and oriented to person, place, and time.  Psychiatric:        Mood and Affect: Mood normal.        Assessment & Plan:  Jeffrey Perry is a 77 y.o. male . Elevated LFTs - Plan: Hepatitis, Acute, Comprehensive metabolic panel  Alkaline phosphatase elevation - Plan: Comprehensive metabolic panel  History of Paget's disease of bone  Hyponatremia -  Plan: Comprehensive metabolic panel  Increased LFTs compared to discharge levels, but abdominal pain has been improving.  Prior alk phos elevation, Paget's disease status posttreatment with rheumatology.  Updated labs ordered.  Noted on 3/26.  LFTs have now normalized as well as hyponatremia.  Possible reactive from prior cholecystectomy versus viral illness.  Lab only visit next few weeks for repeat hepatic function panel.  RTC precautions.  At end of visit, mentioned low heart rate noted on his Apple Watch.  Possible fatigue next morning but has not had recurrent lows.  Asymptomatic at present.  Normal heart rate on exam without ectopy.  RTC/ER precautions if return of bradycardia, especially if symptomatic.   No orders of the defined types were placed in this encounter.  Patient Instructions  I will repeat liver tests today as well as look at liver virus infection testing.  Prior low sodium was borderline, I am rechecking that as well today.  Depending on results of labs, likely will discuss with gastroenterology or have you see them.  If any new abdominal pain, nausea, vomiting or other new symptoms be seen right away.  If you get another low heart rate reading on your Apple Watch or you do feel new lightheadedness, fatigue, or know a low heart rate, be seen right away.  Heart rate overall sounds and looks okay today.  Thanks for coming today and please let me know if there are questions.    Signed,   Meredith Staggers, MD [facility], [facility] 02/22/23 11:36 AM

## 2023-02-22 ENCOUNTER — Other Ambulatory Visit: Payer: Self-pay | Admitting: Family Medicine

## 2023-02-22 ENCOUNTER — Encounter: Payer: Self-pay | Admitting: Family Medicine

## 2023-02-22 DIAGNOSIS — R7989 Other specified abnormal findings of blood chemistry: Secondary | ICD-10-CM

## 2023-02-22 LAB — HEPATITIS PANEL, ACUTE
Hep A IgM: NONREACTIVE
Hep B C IgM: NONREACTIVE
Hepatitis B Surface Ag: NONREACTIVE
Hepatitis C Ab: NONREACTIVE

## 2023-02-22 LAB — COMPREHENSIVE METABOLIC PANEL
ALT: 44 U/L (ref 0–53)
AST: 23 U/L (ref 0–37)
Albumin: 4 g/dL (ref 3.5–5.2)
Alkaline Phosphatase: 157 U/L — ABNORMAL HIGH (ref 39–117)
BUN: 18 mg/dL (ref 6–23)
CO2: 28 mEq/L (ref 19–32)
Calcium: 9.3 mg/dL (ref 8.4–10.5)
Chloride: 102 mEq/L (ref 96–112)
Creatinine, Ser: 1.25 mg/dL (ref 0.40–1.50)
GFR: 56.49 mL/min — ABNORMAL LOW (ref >60.00)
Glucose, Bld: 92 mg/dL (ref 70–99)
Potassium: 4.1 mEq/L (ref 3.5–5.1)
Sodium: 135 mEq/L (ref 135–145)
Total Bilirubin: 0.8 mg/dL (ref 0.2–1.2)
Total Protein: 7.4 g/dL (ref 6.0–8.3)

## 2023-02-22 NOTE — Progress Notes (Signed)
See lab notes

## 2023-02-28 ENCOUNTER — Ambulatory Visit: Payer: Medicare PPO | Admitting: Cardiology

## 2023-03-14 ENCOUNTER — Encounter (INDEPENDENT_AMBULATORY_CARE_PROVIDER_SITE_OTHER): Payer: Medicare PPO | Admitting: Ophthalmology

## 2023-03-14 DIAGNOSIS — H35033 Hypertensive retinopathy, bilateral: Secondary | ICD-10-CM

## 2023-03-14 DIAGNOSIS — I10 Essential (primary) hypertension: Secondary | ICD-10-CM | POA: Diagnosis not present

## 2023-03-14 DIAGNOSIS — H353122 Nonexudative age-related macular degeneration, left eye, intermediate dry stage: Secondary | ICD-10-CM | POA: Diagnosis not present

## 2023-03-14 DIAGNOSIS — H2513 Age-related nuclear cataract, bilateral: Secondary | ICD-10-CM | POA: Diagnosis not present

## 2023-03-14 DIAGNOSIS — H353211 Exudative age-related macular degeneration, right eye, with active choroidal neovascularization: Secondary | ICD-10-CM

## 2023-03-14 DIAGNOSIS — H43813 Vitreous degeneration, bilateral: Secondary | ICD-10-CM

## 2023-03-16 ENCOUNTER — Ambulatory Visit: Payer: Medicare PPO | Admitting: Cardiology

## 2023-03-17 ENCOUNTER — Encounter: Payer: Self-pay | Admitting: Internal Medicine

## 2023-03-17 ENCOUNTER — Ambulatory Visit (AMBULATORY_SURGERY_CENTER): Payer: Medicare PPO | Admitting: *Deleted

## 2023-03-17 VITALS — Ht 73.0 in | Wt 232.0 lb

## 2023-03-17 DIAGNOSIS — Z1211 Encounter for screening for malignant neoplasm of colon: Secondary | ICD-10-CM

## 2023-03-17 MED ORDER — NA SULFATE-K SULFATE-MG SULF 17.5-3.13-1.6 GM/177ML PO SOLN: 1.0000 | Freq: Once | ORAL | 0 refills | Status: AC

## 2023-03-17 NOTE — Progress Notes (Signed)

## 2023-03-21 DIAGNOSIS — E559 Vitamin D deficiency, unspecified: Secondary | ICD-10-CM | POA: Diagnosis not present

## 2023-03-21 DIAGNOSIS — R809 Proteinuria, unspecified: Secondary | ICD-10-CM | POA: Diagnosis not present

## 2023-03-21 DIAGNOSIS — N3040 Irradiation cystitis without hematuria: Secondary | ICD-10-CM | POA: Diagnosis not present

## 2023-03-21 DIAGNOSIS — I129 Hypertensive chronic kidney disease with stage 1 through stage 4 chronic kidney disease, or unspecified chronic kidney disease: Secondary | ICD-10-CM | POA: Diagnosis not present

## 2023-03-21 DIAGNOSIS — N183 Chronic kidney disease, stage 3 unspecified: Secondary | ICD-10-CM | POA: Diagnosis not present

## 2023-03-24 DIAGNOSIS — I7789 Other specified disorders of arteries and arterioles: Secondary | ICD-10-CM | POA: Insufficient documentation

## 2023-03-24 NOTE — Progress Notes (Signed)
  Cardiology Office Note:   Date:  03/25/2023  ID:  Jeffrey Perry, DOB [birth year], MRN [id number]  History of Present Illness:   Jeffrey Perry is a 77 y.o. male who presents for evaluation of HTN and a heart murmur.   His last echocardiogram in May of last year demonstrated an EF that was normal.  He had some mildly elevated pulmonary pressures.  There is mild mitral vegetation and moderate tricuspid regurgitation.  He comes back today and he has been describing palpitations.  These typically are happening at 5 or so in the morning when he sleeping.  He will feel his heart racing or skipping and he gets short of breath.  This happened about twice.  He cannot bring it on with activities.  He has not been as active because he had gallbladder surgery in March.  He has not been having any presyncope or syncope.  Has not been having any chest pressure, neck or arm discomfort.  He had no weight gain or edema.  He is traveling back to [place] later this summer.   ROS: As stated in the HPI and negative for all other systems.  Studies Reviewed:    EKG: Sinus rhythm, rate 64, left axis deviation, left anterior fascicular block, no acute ST-T wave changes.  Risk Assessment/Calculations:      Physical Exam:   VS:  BP 128/80   Pulse 64   Ht 6\' 1"  (1.854 m)   Wt 233 lb (105.7 kg)   SpO2 97%   BMI 30.74 kg/m    Wt Readings from Last 3 Encounters:  03/25/23 233 lb (105.7 kg)  03/17/23 232 lb (105.2 kg)  02/21/23 237 lb (107.5 kg)     GEN: Well nourished, well developed in no acute distress NECK: No JVD; No carotid bruits CARDIAC :  RRR, no murmurs, rubs, gallops RESPIRATORY:  Clear to auscultation without rales, wheezing or rhonchi  ABDOMEN: Soft, non-tender, non-distended EXTREMITIES:  No edema; No deformity   ASSESSMENT AND PLAN:   TR:   He will need repeat echocardiography.  MR: This was moderate and will be followed as above.    ARRHYTHMIA:   I will order a 4-week event monitor.  I will check a  TSH.    HTN:  BP is at target.  No change in therapy.   CKD II:   Creat is mildly elevated but he is followed with pulmonary.    AORTIC ENLARGEMENT:   I would judge this at the time of his echocardiogram.          Signed, Rollene Rotunda, MD

## 2023-03-25 ENCOUNTER — Ambulatory Visit: Payer: Medicare PPO | Attending: Cardiology | Admitting: Cardiology

## 2023-03-25 ENCOUNTER — Encounter: Payer: Self-pay | Admitting: Cardiology

## 2023-03-25 VITALS — BP 128/80 | HR 64 | Ht 73.0 in | Wt 233.0 lb

## 2023-03-25 DIAGNOSIS — I10 Essential (primary) hypertension: Secondary | ICD-10-CM | POA: Diagnosis not present

## 2023-03-25 DIAGNOSIS — I071 Rheumatic tricuspid insufficiency: Secondary | ICD-10-CM

## 2023-03-25 DIAGNOSIS — N182 Chronic kidney disease, stage 2 (mild): Secondary | ICD-10-CM

## 2023-03-25 DIAGNOSIS — R002 Palpitations: Secondary | ICD-10-CM

## 2023-03-25 DIAGNOSIS — I7789 Other specified disorders of arteries and arterioles: Secondary | ICD-10-CM | POA: Diagnosis not present

## 2023-03-25 NOTE — Patient Instructions (Addendum)
Medication Instructions:   Your physician recommends that you continue on your current medications as directed. Please refer to the Current Medication list given to you today.  *If you need a refill on your cardiac medications before your next appointment, please call your pharmacy*  Lab Work: Your physician recommends that you have lab work TODAY:  TSH   If you have labs (blood work) drawn today and your tests are completely normal, you will receive your results only by: MyChart Message (if you have MyChart) OR A paper copy in the mail If you have any lab test that is abnormal or we need to change your treatment, we will call you to review the results.  Testing/Procedures: Your physician has requested that you have an echocardiogram. Echocardiography is a painless test that uses sound waves to create images of your heart. It provides your doctor with information about the size and shape of your heart and how well your heart's chambers and valves are working. This procedure takes approximately one hour. There are no restrictions for this procedure. Please do NOT wear cologne, perfume, aftershave, or lotions (deodorant is allowed). Please arrive 15 minutes prior to your appointment time.  [employer] Cardiac Event Monitor Instructions  Your physician has requested you wear your cardiac event monitor for 30 days, (1-30). [employer] may call or text to confirm a shipping address. The monitor will be sent to a land address via UPS. [employer] will not ship a monitor to a PO BOX. It typically takes 3-5 days to receive your monitor after it has been enrolled. [employer] will assist with USPS tracking if your package is delayed. The telephone number for [employer] is [phone]. Once you have received your monitor, please review the enclosed instructions. Instruction tutorials can also be viewed under help and settings on the enclosed cell phone. Your monitor has already been registered  assigning a specific monitor serial # to you.  Billing and Self Pay Discount Information  [employer] has been provided the insurance information we had on file for you.  If your insurance has been updated, please call [employer] at [phone] to provide them with your updated insurance information.   [employer] offers a discounted Self Pay option for patients who have insurance that does not cover their cardiac event monitor or patients without insurance.  The discounted cost of a Self Pay Cardiac Event Monitor would be $225.00 , if the patient contacts [employer] at [phone] within 7 days of applying the monitor to make payment arrangements.  If the patient does not contact [employer] within 7 days of applying the monitor, the cost of the cardiac event monitor will be $350.00.  Applying the monitor  Remove cell phone from case and turn it on. The cell phone works as IT consultant and needs to be within [employer] of you at all times. The cell phone will need to be charged on a daily basis. We recommend you plug the cell phone into the enclosed charger at your bedside table every night.  Monitor batteries: You will receive two monitor batteries labelled #1 and #2. These are your recorders. Plug battery #2 onto the second connection on the enclosed charger. Keep one battery on the charger at all times. This will keep the monitor battery deactivated. It will also keep it fully charged for when you need to switch your monitor batteries. A small light will be blinking on the battery emblem when it is charging. The light on the battery emblem will remain on when the  battery is fully charged.  Open package of a Monitor strip. Insert battery #1 into black hood on strip and gently squeeze monitor battery onto connection as indicated in instruction booklet. Set aside while preparing skin.  Choose location for your strip, vertical or horizontal, as indicated in the instruction booklet. Shave to  remove all hair from location. There cannot be any lotions, oils, powders, or colognes on skin where monitor is to be applied. Wipe skin clean with enclosed Saline wipe. Dry skin completely.  Peel paper labeled #1 off the back of the Monitor strip exposing the adhesive. Place the monitor on the chest in the vertical or horizontal position shown in the instruction booklet. One arrow on the monitor strip must be pointing upward. Carefully remove paper labeled #2, attaching remainder of strip to your skin. Try not to create any folds or wrinkles in the strip as you apply it.  Firmly press and release the circle in the center of the monitor battery. You will hear a small beep. This is turning the monitor battery on. The heart emblem on the monitor battery will light up every 5 seconds if the monitor battery in turned on and connected to the patient securely. Do not push and hold the circle down as this turns the monitor battery off. The cell phone will locate the monitor battery. A screen will appear on the cell phone checking the connection of your monitor strip. This may read poor connection initially but change to good connection within the next minute. Once your monitor accepts the connection you will hear a series of 3 beeps followed by a climbing crescendo of beeps. A screen will appear on the cell phone showing the two monitor strip placement options. Touch the picture that demonstrates where you applied the monitor strip.  Your monitor strip and battery are waterproof. You are able to shower, bathe, or swim with the monitor on. They just ask you do not submerge deeper than 3 feet underwater. We recommend removing the monitor if you are swimming in a lake, river, or ocean.  Your monitor battery will need to be switched to a fully charged monitor battery approximately once a week. The cell phone will alert you of an action which needs to be made.  On the cell phone, tap for details to  reveal connection status, monitor battery status, and cell phone battery status. The green dots indicates your monitor is in good status. A red dot indicates there is something that needs your attention.  To record a symptom, click the circle on the monitor battery. In 30-60 seconds a list of symptoms will appear on the cell phone. Select your symptom and tap save. Your monitor will record a sustained or significant arrhythmia regardless of you clicking the button. Some patients do not feel the heart rhythm irregularities. [employer] will notify us of any serious or critical events.  Refer to instruction booklet for instructions on switching batteries, changing strips, the Do not disturb or Pause features, or any additional questions.  Call [employer] at [phone], to confirm your monitor is transmitting and record your baseline. They will answer any questions you may have regarding the monitor instructions at that time.  Returning the monitor to [employer]  Place all equipment back into blue box. Peel off strip of paper to expose adhesive and close box securely. There is a prepaid UPS shipping label on this box. Drop in a UPS drop box, or at a UPS facility like [employer]. You may also  contact [employer] to arrange UPS to pick up monitor package at your home.  Follow-Up: At [facility], you and your health needs are our priority.  As part of our continuing mission to provide you with exceptional heart care, we have created designated Provider Care Teams.  These Care Teams include your primary Cardiologist (physician) and Advanced Practice Providers (APPs -  Physician Assistants and Nurse Practitioners) who all work together to provide you with the care you need, when you need it.  We recommend signing up for the patient portal called "MyChart".  Sign up information is provided on this After Visit Summary.  MyChart is used to connect with patients for Virtual Visits  (Telemedicine).  Patients are able to view lab/test results, encounter notes, upcoming appointments, etc.  Non-urgent messages can be sent to your provider as well.   To learn more about what you can do with MyChart, go to [email or web address].    Your next appointment:   2 year(s)  Provider:   Rollene Rotunda, MD     Other Instructions

## 2023-03-26 LAB — TSH: TSH: 2.8 u[IU]/mL (ref 0.450–4.500)

## 2023-03-29 DIAGNOSIS — N183 Chronic kidney disease, stage 3 unspecified: Secondary | ICD-10-CM | POA: Diagnosis not present

## 2023-03-31 ENCOUNTER — Encounter: Payer: Self-pay | Admitting: Internal Medicine

## 2023-03-31 ENCOUNTER — Ambulatory Visit (AMBULATORY_SURGERY_CENTER): Payer: Medicare PPO | Admitting: Internal Medicine

## 2023-03-31 VITALS — BP 137/85 | HR 50 | Temp 98.4°F | Resp 17 | Ht 73.0 in | Wt 232.0 lb

## 2023-03-31 DIAGNOSIS — Z86010 Personal history of colonic polyps: Secondary | ICD-10-CM

## 2023-03-31 DIAGNOSIS — D123 Benign neoplasm of transverse colon: Secondary | ICD-10-CM

## 2023-03-31 DIAGNOSIS — D125 Benign neoplasm of sigmoid colon: Secondary | ICD-10-CM

## 2023-03-31 DIAGNOSIS — N182 Chronic kidney disease, stage 2 (mild): Secondary | ICD-10-CM | POA: Diagnosis not present

## 2023-03-31 DIAGNOSIS — K635 Polyp of colon: Secondary | ICD-10-CM | POA: Diagnosis not present

## 2023-03-31 DIAGNOSIS — Z1211 Encounter for screening for malignant neoplasm of colon: Secondary | ICD-10-CM | POA: Diagnosis not present

## 2023-03-31 DIAGNOSIS — I10 Essential (primary) hypertension: Secondary | ICD-10-CM | POA: Diagnosis not present

## 2023-03-31 DIAGNOSIS — Z860101 Personal history of adenomatous and serrated colon polyps: Secondary | ICD-10-CM

## 2023-03-31 HISTORY — DX: Personal history of adenomatous and serrated colon polyps: Z86.0101

## 2023-03-31 HISTORY — DX: Personal history of colonic polyps: Z86.010

## 2023-03-31 MED ORDER — SODIUM CHLORIDE 0.9 % IV SOLN: 500.0000 mL | Freq: Once | INTRAVENOUS | Status: DC

## 2023-03-31 NOTE — Progress Notes (Signed)
[facility] History and Physical   Primary Care Physician:  Shade Flood, MD   Reason for Procedure:   CRCA screening  Plan:    colonoscopy     HPI: Jeffrey Perry is a 77 y.o. male for screening exam   Past Medical History:  Diagnosis Date   Cataract    "SMALL ONE IN ONE EYE LEFT"   Chronic kidney disease    STAGE 2   Heart murmur    Hypertension    Paget disease of bone    Questionable diagnosis.    Prostate cancer (HCC)    Sickle cell trait (HCC)    Tricuspid regurgitation     Past Surgical History:  Procedure Laterality Date   BLADDER SURGERY     CHOLECYSTECTOMY     COLONOSCOPY     FINGER GANGLION CYST EXCISION  2011   3rd finger right hand   HYDROCELE EXCISION  2012   PENILE PROSTHESIS IMPLANT     TRIGGER FINGER RELEASE Left 08/2021   thumb    Prior to Admission medications   Medication Sig Start Date End Date Taking? Authorizing Provider  alfuzosin (UROXATRAL) 10 MG 24 hr tablet TAKE ONE TABLET BY MOUTH EVERY MORNING WITH BREAKFAST Patient taking differently: at bedtime. 11/18/22  Yes Shade Flood, MD  diltiazem [facility]) 180 MG 24 hr capsule Take 1 capsule (180 mg total) by mouth at bedtime. 11/18/22  Yes Shade Flood, MD  losartan-hydrochlorothiazide (HYZAAR) 100-25 MG tablet Take 1 tablet by mouth daily. 11/18/22  Yes Shade Flood, MD  potassium chloride (KLOR-CON M) 10 MEQ tablet Take 1 tablet (10 mEq total) by mouth 2 (two) times daily. 11/18/22  Yes Shade Flood, MD  Vitamin D, Ergocalciferol, 50000 units CAPS Take 50,000 Units by mouth every 30 (thirty) days.   Yes [provider]  bevacizumab (AVASTIN) 2.5 mg/0.1 mL SOLN intravitreal injection 0.625 mg by Intravitreal route every 30 (thirty) days.    [provider]  ciprofloxacin (CILOXAN) 0.3 % ophthalmic solution Place into the right eye every 30 (thirty) days. 10/09/22   [provider]  senna-docusate (SENOKOT-S) 8.6-50 MG tablet Take  1 tablet by mouth as needed for mild constipation. 02/03/23   [provider]    Current Outpatient Medications  Medication Sig Dispense Refill   alfuzosin (UROXATRAL) 10 MG 24 hr tablet TAKE ONE TABLET BY MOUTH EVERY MORNING WITH BREAKFAST (Patient taking differently: at bedtime.) 90 tablet 1   diltiazem (TIAZAC) 180 MG 24 hr capsule Take 1 capsule (180 mg total) by mouth at bedtime. 90 capsule 1   losartan-hydrochlorothiazide (HYZAAR) 100-25 MG tablet Take 1 tablet by mouth daily. 90 tablet 1   potassium chloride (KLOR-CON M) 10 MEQ tablet Take 1 tablet (10 mEq total) by mouth 2 (two) times daily. 180 tablet 1   Vitamin D, Ergocalciferol, 50000 units CAPS Take 50,000 Units by mouth every 30 (thirty) days.     bevacizumab (AVASTIN) 2.5 mg/0.1 mL SOLN intravitreal injection 0.625 mg by Intravitreal route every 30 (thirty) days.     ciprofloxacin (CILOXAN) 0.3 % ophthalmic solution Place into the right eye every 30 (thirty) days.     senna-docusate (SENOKOT-S) 8.6-50 MG tablet Take 1 tablet by mouth as needed for mild constipation.     Current Facility-Administered Medications  Medication Dose Route Frequency Provider Last Rate Last Admin   0.9 %  sodium chloride infusion  500 mL Intravenous Once Iva Boop, MD  Allergies as of 03/31/2023 - Review Complete 03/31/2023  Allergen Reaction Noted   Latex Itching 04/30/2016   Lisinopril Cough 09/07/2011   Neuromuscular blocking agents Swelling 03/06/2012   Oxycodone Itching 02/09/2023   Phenazopyridine hcl Swelling 03/06/2012   Pyridium [phenazopyridine hcl] Swelling 03/06/2012   Sulfonamide derivatives Itching 09/07/2011    Family History  Problem Relation Age of Onset   Hypertension Mother    Hypertension Sister    Stroke Sister    Hypertension Sister    Hypertension Brother    Hypertension Brother    Colon cancer Neg Hx    Colon polyps Neg Hx    Crohn's disease Neg Hx    Esophageal cancer Neg Hx    Rectal  cancer Neg Hx    Stomach cancer Neg Hx    Ulcerative colitis Neg Hx     Social History   Socioeconomic History   Marital status: Married    Spouse name: Not on file   Number of children: Not on file   Years of education: Not on file   Highest education level: Doctorate  Occupational History   Occupation: professor    Employer: [employer]  Tobacco Use   Smoking status: Never    Passive exposure: Never   Smokeless tobacco: Never  Vaping Use   Vaping Use: Never used  Substance and Sexual Activity   Alcohol use: Yes    Comment: Beer occ   Drug use: No   Sexual activity: Yes    Partners: Female  Other Topics Concern   Not on file  Social History Narrative   Not on file   Social Determinants of Health   Financial Resource Strain: Low Risk  (02/17/2023)   Overall Financial Resource Strain (CARDIA)    Difficulty of Paying Living Expenses: Not hard at all  Food Insecurity: No Food Insecurity (02/17/2023)   Hunger Vital Sign    Worried About Running Out of Food in the Last Year: Never true    Ran Out of Food in the Last Year: Never true  Transportation Needs: No Transportation Needs (02/17/2023)   PRAPARE - Administrator, Civil Service (Medical): No    Lack of Transportation (Non-Medical): No  Physical Activity: Sufficiently Active (02/17/2023)   Exercise Vital Sign    Days of Exercise per Week: 7 days    Minutes of Exercise per Session: 70 min  Stress: No Stress Concern Present (02/17/2023)   [employer] of Occupational Health - Occupational Stress Questionnaire    Feeling of Stress : Not at all  Social Connections: Socially Integrated (02/17/2023)   Social Connection and Isolation Panel [NHANES]    Frequency of Communication with Friends and Family: More than three times a week    Frequency of Social Gatherings with Friends and Family: Twice a week    Attends Religious Services: More than 4 times per year    Active Member of [employer] or Organizations: Yes     Attends Banker Meetings: 1 to 4 times per year    Marital Status: Married  Catering manager Violence: Not At Risk (06/23/2022)   Humiliation, Afraid, Rape, and Kick questionnaire    Fear of Current or Ex-Partner: No    Emotionally Abused: No    Physically Abused: No    Sexually Abused: No    Review of Systems:  All other review of systems negative except as mentioned in the HPI.  Physical Exam: Vital signs BP 125/70   Pulse 60  Temp 98.4 F (36.9 C) (Temporal)   Ht 6\' 1"  (1.854 m)   Wt 232 lb (105.2 kg)   SpO2 99%   BMI 30.61 kg/m   General:   Alert,  Well-developed, well-nourished, pleasant and cooperative in NAD Lungs:  Clear throughout to auscultation.   Heart:  Regular rate and rhythm; no murmurs, clicks, rubs,  or gallops. Abdomen:  Soft, nontender and nondistended. Normal bowel sounds.   Neuro/Psych:  Alert and cooperative. Normal mood and affect. A and O x 3   @Jeffrey Perry  Sena Slate, MD, [facility] [phone] (pager) 03/31/2023 11:51 AM@

## 2023-03-31 NOTE — Op Note (Signed)
[facility] Patient Name: Jeffrey Perry Procedure Date: 03/31/2023 11:53 AM MRN: [id number] Endoscopist: Iva Boop , MD, [id number] Age: 77 Referring MD:  Date of Birth: [birth year] Gender: Male Account #: [id number] Procedure:                Colonoscopy Indications:              Screening for colorectal malignant neoplasm, Last                            colonoscopy: 2014 Medicines:                Monitored Anesthesia Care Procedure:                Pre-Anesthesia Assessment:                           - Prior to the procedure, a History and Physical                            was performed, and patient medications and                            allergies were reviewed. The patient's tolerance of                            previous anesthesia was also reviewed. The risks                            and benefits of the procedure and the sedation                            options and risks were discussed with the patient.                            All questions were answered, and informed consent                            was obtained. Prior Anticoagulants: The patient has                            taken no anticoagulant or antiplatelet agents. ASA                            Grade Assessment: III - A patient with severe                            systemic disease. After reviewing the risks and                            benefits, the patient was deemed in satisfactory                            condition to undergo the procedure.  After obtaining informed consent, the colonoscope                            was passed under direct vision. Throughout the                            procedure, the patient's blood pressure, pulse, and                            oxygen saturations were monitored continuously. The                            Olympus CF-HQ190L [id number] [id number]) Colonoscope was                            introduced through the anus and advanced to  the the                            cecum, identified by appendiceal orifice and                            ileocecal valve. The colonoscopy was performed                            without difficulty. The patient tolerated the                            procedure well. The quality of the bowel                            preparation was good. The ileocecal valve,                            appendiceal orifice, and rectum were photographed. Scope In: 11:57:54 AM Scope Out: 12:12:29 PM Scope Withdrawal Time: 0 hours 11 minutes 5 seconds  Total Procedure Duration: 0 hours 14 minutes 35 seconds  Findings:                 The perianal and digital rectal examinations were                            normal.                           Three sessile polyps were found in the sigmoid                            colon and transverse colon. The polyps were                            diminutive in size. These polyps were removed with                            a cold snare. Resection and retrieval were  complete. Verification of patient identification                            for the specimen was done. Estimated blood loss was                            minimal.                           Diverticula were found in the sigmoid colon.                           The exam was otherwise without abnormality on                            direct and retroflexion views. Complications:            No immediate complications. Estimated Blood Loss:     Estimated blood loss was minimal. Impression:               - Three diminutive polyps in the sigmoid colon and                            in the transverse colon, removed with a cold snare.                            Resected and retrieved.                           - Diverticulosis in the sigmoid colon.                           - The examination was otherwise normal on direct                            and retroflexion  views. Recommendation:           - Patient has a contact number available for                            emergencies. The signs and symptoms of potential                            delayed complications were discussed with the                            patient. Return to normal activities tomorrow.                            Written discharge instructions were provided to the                            patient.                           - Resume previous diet.                           -  Continue present medications.                           - Await pathology results.                           - No recommendation at this time regarding repeat                            colonoscopy due to age. Iva Boop, MD 03/31/2023 12:20:25 PM This report has been signed electronically.

## 2023-03-31 NOTE — Progress Notes (Signed)
Pt's states no medical or surgical changes since previsit or office visit. 

## 2023-03-31 NOTE — Progress Notes (Signed)
Vss nad trans to pacu 

## 2023-03-31 NOTE — Progress Notes (Signed)
Called to room to assist during endoscopic procedure.  Patient ID and intended procedure confirmed with present staff. Received instructions for my participation in the procedure from the performing physician.  

## 2023-03-31 NOTE — Patient Instructions (Addendum)
I found and removed 3 tiny polyps tat look benign. I will let you know pathology results by mail and/or My Chart. I do not think you should repeat a routine colonoscopy - I will look at pathology and let you knw.  You also have a condition called diverticulosis - common and not usually a problem. Please read the handout provided.  I appreciate the opportunity to care for you. Iva Boop, MD, [facility]  Handouts provided on polyps and diverticulosis.  Resume previous diet.  Continue present medications.  Await pathology results.  No recommendation at this time regarding repeat colonoscopy due to age.   YOU HAD AN ENDOSCOPIC PROCEDURE TODAY AT THE [facility]:   Refer to the procedure report that was given to you for any specific questions about what was found during the examination.  If the procedure report does not answer your questions, please call your gastroenterologist to clarify.  If you requested that your care partner not be given the details of your procedure findings, then the procedure report has been included in a sealed envelope for you to review at your convenience later.  YOU SHOULD EXPECT: Some feelings of bloating in the abdomen. Passage of more gas than usual.  Walking can help get rid of the air that was put into your GI tract during the procedure and reduce the bloating. If you had a lower endoscopy (such as a colonoscopy or flexible sigmoidoscopy) you may notice spotting of blood in your stool or on the toilet paper. If you underwent a bowel prep for your procedure, you may not have a normal bowel movement for a few days.  Please Note:  You might notice some irritation and congestion in your nose or some drainage.  This is from the oxygen used during your procedure.  There is no need for concern and it should clear up in a day or so.  SYMPTOMS TO REPORT IMMEDIATELY:  Following lower endoscopy (colonoscopy or flexible sigmoidoscopy):  Excessive amounts of blood  in the stool  Significant tenderness or worsening of abdominal pains  Swelling of the abdomen that is new, acute  Fever of 100F or higher  For urgent or emergent issues, a gastroenterologist can be reached at any hour by calling [phone]. Do not use MyChart messaging for urgent concerns.    DIET:  We do recommend a small meal at first, but then you may proceed to your regular diet.  Drink plenty of fluids but you should avoid alcoholic beverages for 24 hours.  ACTIVITY:  You should plan to take it easy for the rest of today and you should NOT DRIVE or use heavy machinery until tomorrow (because of the sedation medicines used during the test).    FOLLOW UP: Our staff will call the number listed on your records the next business day following your procedure.  We will call around 7:15- 8:00 am to check on you and address any questions or concerns that you may have regarding the information given to you following your procedure. If we do not reach you, we will leave a message.     If any biopsies were taken you will be contacted by phone or by letter within the next 1-3 weeks.  Please call us at [phone] if you have not heard about the biopsies in 3 weeks.    SIGNATURES/CONFIDENTIALITY: You and/or your care partner have signed paperwork which will be entered into your electronic medical record.  These signatures attest to  the fact that that the information above on your After Visit Summary has been reviewed and is understood.  Full responsibility of the confidentiality of this discharge information lies with you and/or your care-partner.

## 2023-04-01 ENCOUNTER — Telehealth: Payer: Self-pay

## 2023-04-01 NOTE — Telephone Encounter (Signed)
  Follow up Call-     03/31/2023   11:03 AM  Call back number  Post procedure Call Back phone  # [phone]  Permission to leave phone message Yes     Patient questions:  Do you have a fever, pain , or abdominal swelling? No. Pain Score  0 *  Have you tolerated food without any problems? Yes.    Have you been able to return to your normal activities? Yes.    Do you have any questions about your discharge instructions: Diet   No. Medications  No. Follow up visit  No.  Do you have questions or concerns about your Care? No.  Actions: * If pain score is 4 or above: No action needed, pain <4.

## 2023-04-11 ENCOUNTER — Encounter (INDEPENDENT_AMBULATORY_CARE_PROVIDER_SITE_OTHER): Payer: Medicare PPO | Admitting: Ophthalmology

## 2023-04-11 DIAGNOSIS — H35033 Hypertensive retinopathy, bilateral: Secondary | ICD-10-CM

## 2023-04-11 DIAGNOSIS — H2513 Age-related nuclear cataract, bilateral: Secondary | ICD-10-CM | POA: Diagnosis not present

## 2023-04-11 DIAGNOSIS — H318 Other specified disorders of choroid: Secondary | ICD-10-CM

## 2023-04-11 DIAGNOSIS — H43813 Vitreous degeneration, bilateral: Secondary | ICD-10-CM

## 2023-04-11 DIAGNOSIS — I10 Essential (primary) hypertension: Secondary | ICD-10-CM

## 2023-04-12 ENCOUNTER — Encounter: Payer: Self-pay | Admitting: Internal Medicine

## 2023-04-21 ENCOUNTER — Ambulatory Visit (HOSPITAL_COMMUNITY): Payer: Medicare PPO | Attending: Cardiology

## 2023-04-21 DIAGNOSIS — I071 Rheumatic tricuspid insufficiency: Secondary | ICD-10-CM | POA: Diagnosis not present

## 2023-04-21 DIAGNOSIS — I5030 Unspecified diastolic (congestive) heart failure: Secondary | ICD-10-CM | POA: Diagnosis not present

## 2023-04-21 LAB — ECHOCARDIOGRAM COMPLETE
Area-P 1/2: 2.09 cm2
S' Lateral: 2.5 cm

## 2023-05-09 ENCOUNTER — Encounter (INDEPENDENT_AMBULATORY_CARE_PROVIDER_SITE_OTHER): Payer: Medicare PPO | Admitting: Ophthalmology

## 2023-05-09 DIAGNOSIS — H35033 Hypertensive retinopathy, bilateral: Secondary | ICD-10-CM

## 2023-05-09 DIAGNOSIS — H43813 Vitreous degeneration, bilateral: Secondary | ICD-10-CM

## 2023-05-09 DIAGNOSIS — H318 Other specified disorders of choroid: Secondary | ICD-10-CM | POA: Diagnosis not present

## 2023-05-09 DIAGNOSIS — I10 Essential (primary) hypertension: Secondary | ICD-10-CM

## 2023-05-09 DIAGNOSIS — H353132 Nonexudative age-related macular degeneration, bilateral, intermediate dry stage: Secondary | ICD-10-CM

## 2023-05-11 DIAGNOSIS — K5900 Constipation, unspecified: Secondary | ICD-10-CM | POA: Diagnosis not present

## 2023-05-11 DIAGNOSIS — E876 Hypokalemia: Secondary | ICD-10-CM | POA: Diagnosis not present

## 2023-05-11 DIAGNOSIS — E669 Obesity, unspecified: Secondary | ICD-10-CM | POA: Diagnosis not present

## 2023-05-11 DIAGNOSIS — I129 Hypertensive chronic kidney disease with stage 1 through stage 4 chronic kidney disease, or unspecified chronic kidney disease: Secondary | ICD-10-CM | POA: Diagnosis not present

## 2023-05-11 DIAGNOSIS — M5430 Sciatica, unspecified side: Secondary | ICD-10-CM | POA: Diagnosis not present

## 2023-05-11 DIAGNOSIS — N401 Enlarged prostate with lower urinary tract symptoms: Secondary | ICD-10-CM | POA: Diagnosis not present

## 2023-05-11 DIAGNOSIS — N529 Male erectile dysfunction, unspecified: Secondary | ICD-10-CM | POA: Diagnosis not present

## 2023-05-11 DIAGNOSIS — R011 Cardiac murmur, unspecified: Secondary | ICD-10-CM | POA: Diagnosis not present

## 2023-05-11 DIAGNOSIS — H269 Unspecified cataract: Secondary | ICD-10-CM | POA: Diagnosis not present

## 2023-05-17 NOTE — Progress Notes (Signed)
Office Visit Note  [name]             Date of Birth: Apr 23, 1946           MRN: [id number]             PCP: Shade Flood, MD Referring: Shade Flood, MD Visit Date: 05/31/2023 Occupation: @GUAROCC @  Subjective:  Medication management  History of Present Illness: Arieh Frasier is a 77 y.o. male with Paget's disease of the pelvis.  He returns today after his last visit on December 08, 2022.  He had good results from left thumb trigger finger release.  He states his left hip pain has completely resolved.  He underwent cholecystectomy on February 02, 2023.  He had good recovery from the surgery.    Activities of Daily Living:  Patient reports morning stiffness for 0 minutes.   Patient Denies nocturnal pain.  Difficulty dressing/grooming: Denies Difficulty climbing stairs: Denies Difficulty getting out of chair: Denies Difficulty using hands for taps, buttons, cutlery, and/or writing: Denies  Review of Systems  Constitutional:  Negative for fatigue.  HENT:  Negative for mouth sores and mouth dryness.   Eyes:  Negative for dryness.  Respiratory:  Negative for shortness of breath.   Cardiovascular:  Negative for chest pain and palpitations.  Gastrointestinal:  Negative for blood in stool, constipation and diarrhea.  Endocrine: Negative for increased urination.  Genitourinary:  Negative for involuntary urination.  Musculoskeletal:  Negative for joint pain, gait problem, joint pain, joint swelling, myalgias, muscle weakness, morning stiffness, muscle tenderness and myalgias.  Skin:  Negative for color change, rash and sensitivity to sunlight.  Allergic/Immunologic: Negative for susceptible to infections.  Neurological:  Negative for dizziness and headaches.  Hematological:  Negative for swollen glands.  Psychiatric/Behavioral:  Negative for depressed mood and sleep disturbance. The patient is not nervous/anxious.     PMFS History:  Patient Active Problem List    Diagnosis Date Noted   Aortic root enlargement (HCC) 03/24/2023   Tricuspid valve insufficiency 09/25/2020   Palpitations 09/25/2020   CKD (chronic kidney disease), stage II 09/25/2020   Murmur 07/18/2017   Elevated LFTs    Abdominal pain 04/30/2016   Renal insufficiency 03/05/2013   HTN (hypertension) 03/06/2012    Past Medical History:  Diagnosis Date   Cataract    "SMALL ONE IN ONE EYE LEFT"   Chronic kidney disease    STAGE 2   Heart murmur    Hx of adenomatous polyp of colon 03/31/2023   Hypertension    Paget disease of bone    Questionable diagnosis.    Prostate cancer (HCC)    Sickle cell trait (HCC)    Tricuspid regurgitation     Family History  Problem Relation Age of Onset   Hypertension Mother    Hypertension Sister    Stroke Sister    Hypertension Sister    Hypertension Brother    Hypertension Brother    Colon cancer Neg Hx    Colon polyps Neg Hx    Crohn's disease Neg Hx    Esophageal cancer Neg Hx    Rectal cancer Neg Hx    Stomach cancer Neg Hx    Ulcerative colitis Neg Hx    Past Surgical History:  Procedure Laterality Date   BLADDER SURGERY     CHOLECYSTECTOMY  02/02/2023   COLONOSCOPY     FINGER GANGLION CYST EXCISION  2011   3rd finger right hand   HYDROCELE EXCISION  2012   PENILE PROSTHESIS IMPLANT     TRIGGER FINGER RELEASE Left 08/2021   thumb   Social History   Social History Narrative   Not on file   Immunization History  Administered Date(s) Administered   DTaP 03/22/2008   Gamma Globulin 06/08/1994   H1N1 10/03/2004   Hepatitis A 03/29/1996, 06/10/1998   Hepatitis B 03/22/2008, 10/07/2008   IPV 03/18/2003   Meningococcal Conjugate 03/22/2008   PFIZER(Purple Top)SARS-COV-2 Vaccination 01/20/2020, 02/12/2020, 07/30/2020   Pneumococcal Conjugate-13 05/02/2010, 01/31/2019   Pneumococcal Polysaccharide-23 03/04/2014   Pneumococcal-Unspecified 05/02/2010   Tdap 03/22/2008, 03/24/2018   Typhoid Parenteral 05/29/2002    Yellow Fever 03/22/2008   Zoster, Live 03/29/2010     Objective: Vital Signs: BP 123/80 (BP Location: Left Arm, Patient Position: Sitting, Cuff Size: Large)   Pulse (!) 56   Resp 17   Ht 6\' 1"  (1.854 m)   Wt 237 lb 6.4 oz (107.7 kg)   BMI 31.32 kg/m    Physical Exam Vitals and nursing note reviewed.  Constitutional:      Appearance: He is well-developed.  HENT:     Head: Normocephalic and atraumatic.  Eyes:     Conjunctiva/sclera: Conjunctivae normal.     Pupils: Pupils are equal, round, and reactive to light.  Cardiovascular:     Rate and Rhythm: Normal rate and regular rhythm.     Heart sounds: Normal heart sounds.  Pulmonary:     Effort: Pulmonary effort is normal.     Breath sounds: Normal breath sounds.  Abdominal:     General: Bowel sounds are normal.     Palpations: Abdomen is soft.  Musculoskeletal:     Cervical back: Normal range of motion and neck supple.  Skin:    General: Skin is warm and dry.     Capillary Refill: Capillary refill takes less than 2 seconds.  Neurological:     Mental Status: He is alert and oriented to person, place, and time.  Psychiatric:        Behavior: Behavior normal.      Musculoskeletal Exam: Cervical, thoracic and lumbar spine were in good range of motion he had no tenderness.  Shoulder, elbows, wrist joints, PIPs and DIPs were in good range of motion with no synovitis.  Hips, knees, ankles were in good range of motion without synovitis.  He had no tenderness over MTPs.  CDAI Exam: CDAI Score: -- Patient Global: --; Provider Global: -- Swollen: --; Tender: -- Joint Exam 05/31/2023   No joint exam has been documented for this visit   There is currently no information documented on the homunculus. Go to the Rheumatology activity and complete the homunculus joint exam.  Investigation: No additional findings.  Imaging: No results found.  Recent Labs: Lab Results  Component Value Date   WBC 5.9 02/09/2023   HGB 12.5 (L)  02/09/2023   PLT 327.0 02/09/2023   NA 138 05/18/2023   K 3.6 05/18/2023   CL 103 05/18/2023   CO2 27 05/18/2023   GLUCOSE 90 05/18/2023   BUN 13 05/18/2023   CREATININE 1.26 05/18/2023   BILITOT 0.7 05/18/2023   ALKPHOS 74 05/18/2023   AST 21 05/18/2023   ALT 17 05/18/2023   PROT 7.8 05/18/2023   ALBUMIN 4.2 05/18/2023   CALCIUM 9.2 05/18/2023   GFRAA 60 06/04/2021   May 18, 2023 alkaline phosphatase 74. Speciality Comments: No specialty comments available.  Procedures:  No procedures performed Allergies: Latex, Lisinopril, Neuromuscular blocking agents, Oxycodone, Phenazopyridine  hcl, Pyridium [phenazopyridine hcl], and Sulfonamide derivatives   Assessment / Plan:     Visit Diagnoses: Paget disease of bone: Pelvis and sacrum. -He denies any discomfort in the pelvic region.  Patient received IV Reclast 5 mg on May 09, 2020.  He had no recurrence of symptoms since the initial treatment. - Plan: COMPLETE METABOLIC PANEL WITH GFR in 6 months  Pain in left hip -this discomfort is completely resolved.  Patient had good range of motion of his left hip.  Patient had a fall in June 2023 and he landed on his left hip.  He was evaluated by Dr. Chilton Si.  Elevated alkaline phosphatase level - alkaline phosphatase has been within normal limits.  May 18, 2023 alkaline phosphatase was 74.  He had elevated alkaline phosphatase in March due to cholecystitis.  Stage 3a chronic kidney disease (HCC) - He is followed by Dr. Arrie Aran.  Creatinine was 1.26 on May 18, 2023.  Prostate cancer [facility])  Essential hypertension-the patient was normal at 123/80.  S/P cholecystectomy-patient underwent cholecystectomy on February 02, 2023.  He got some recovery from surgery.  Orders: Orders Placed This Encounter  Procedures   COMPLETE METABOLIC PANEL WITH GFR   No orders of the defined types were placed in this encounter.  .  Follow-Up Instructions: Return in about 6 months (around 12/01/2023) for  Paget's.   Pollyann Savoy, MD  Note - This record has been created using Animal nutritionist.  Chart creation errors have been sought, but may not always  have been located. Such creation errors do not reflect on  the standard of medical care.

## 2023-05-18 ENCOUNTER — Encounter: Payer: Self-pay | Admitting: Family Medicine

## 2023-05-18 ENCOUNTER — Ambulatory Visit: Payer: Medicare PPO | Admitting: Family Medicine

## 2023-05-18 VITALS — BP 128/76 | HR 54 | Temp 98.3°F | Ht 73.0 in | Wt 237.0 lb

## 2023-05-18 DIAGNOSIS — R7303 Prediabetes: Secondary | ICD-10-CM

## 2023-05-18 DIAGNOSIS — L299 Pruritus, unspecified: Secondary | ICD-10-CM | POA: Diagnosis not present

## 2023-05-18 DIAGNOSIS — E876 Hypokalemia: Secondary | ICD-10-CM | POA: Diagnosis not present

## 2023-05-18 DIAGNOSIS — D7389 Other diseases of spleen: Secondary | ICD-10-CM

## 2023-05-18 DIAGNOSIS — R7989 Other specified abnormal findings of blood chemistry: Secondary | ICD-10-CM | POA: Diagnosis not present

## 2023-05-18 DIAGNOSIS — I10 Essential (primary) hypertension: Secondary | ICD-10-CM | POA: Diagnosis not present

## 2023-05-18 LAB — COMPREHENSIVE METABOLIC PANEL
ALT: 17 U/L (ref 0–53)
AST: 21 U/L (ref 0–37)
Albumin: 4.2 g/dL (ref 3.5–5.2)
Alkaline Phosphatase: 74 U/L (ref 39–117)
BUN: 13 mg/dL (ref 6–23)
CO2: 27 mEq/L (ref 19–32)
Calcium: 9.2 mg/dL (ref 8.4–10.5)
Chloride: 103 mEq/L (ref 96–112)
Creatinine, Ser: 1.26 mg/dL (ref 0.40–1.50)
GFR: 55.86 mL/min — ABNORMAL LOW (ref >60.00)
Glucose, Bld: 90 mg/dL (ref 70–99)
Potassium: 3.6 mEq/L (ref 3.5–5.1)
Sodium: 138 mEq/L (ref 135–145)
Total Bilirubin: 0.7 mg/dL (ref 0.2–1.2)
Total Protein: 7.8 g/dL (ref 6.0–8.3)

## 2023-05-18 LAB — LIPID PANEL
Cholesterol: 126 mg/dL (ref 0–200)
HDL: 30 mg/dL — ABNORMAL LOW (ref >39.00)
LDL Cholesterol: 73 mg/dL (ref 0–99)
NonHDL: 95.89
Total CHOL/HDL Ratio: 4
Triglycerides: 116 mg/dL (ref 0.0–149.0)
VLDL: 23.2 mg/dL (ref 0.0–40.0)

## 2023-05-18 LAB — HEMOGLOBIN A1C: Hgb A1c MFr Bld: 5.8 % (ref 4.6–6.5)

## 2023-05-18 MED ORDER — LOSARTAN POTASSIUM-HCTZ 100-25 MG PO TABS: 1.0000 | ORAL_TABLET | Freq: Every day | ORAL | 1 refills | Status: DC

## 2023-05-18 MED ORDER — DILTIAZEM HCL ER BEADS 180 MG PO CP24: 180.0000 mg | ORAL_CAPSULE | Freq: Every day | ORAL | 1 refills | Status: DC

## 2023-05-18 MED ORDER — POTASSIUM CHLORIDE CRYS ER 10 MEQ PO TBCR: 10.0000 meq | EXTENDED_RELEASE_TABLET | Freq: Two times a day (BID) | ORAL | 1 refills | Status: DC

## 2023-05-18 NOTE — Patient Instructions (Addendum)
Aveeno lotion or over the counter Sarna lotion if needed for itching. Hydrocortisone also an option if needed short term. If that persists, we could consider med like claritin but that has some risks and side effects. Keep me posted.   I will check labs, no med changes for now.  Blood pressure looks good today.  If any concerns on labs I will let you know.  I will also order the MRI for October for recheck of spleen but that appeared to be benign previously.  Thanks for coming in today, please let me know if there are any questions.  Pruritus Pruritus is an itchy feeling on the skin. One of the most common causes is dry skin, but many different things can cause itching. Most cases of itching do not require medical attention. Sometimes itchy skin can turn into a rash or a secondary infection. Follow these instructions at home: Skin care  Do not use scented soaps, detergents, perfumes, and cosmetic products. Instead, use gentle, unscented versions of these items. Apply moisturizing creams to your skin frequently, at least twice daily. Apply immediately after bathing while skin is still wet. Take medicines or apply medicated creams only as told by your health care provider. This may include: Corticosteroid cream or topical calcineurin inhibitor. Anti-itch lotions containing urea, camphor, or menthol. Oral antihistamines. Do not take hot showers or baths, which can make itching worse. A short, cool shower may help with itching as long as you apply moisturizing lotion after the shower. Apply a cool, wet cloth (cool compress) to the affected areas. You may take lukewarm baths with one of the following: Epsom salts. You can get these at your local pharmacy or grocery store. Follow the instructions on the packaging. Baking soda. Pour a small amount into the bath as told by your health care provider. Colloidal oatmeal. You can get this at your local pharmacy or grocery store. Follow the instructions  on the packaging. Do not scratch your skin. General instructions Avoid wearing tight clothes. Keep a journal to help find out what is causing your itching. Write down: What you eat and drink. What cosmetic products you use. What soaps or detergents you use. What you wear, including jewelry. Use a humidifier. This keeps the air moist, which helps to prevent dry skin. Be aware of any changes in your itchiness. Tell your health care provider about any changes. Contact a health care provider if: The itching does not go away after several days. You notice redness, warmth, or drainage on the skin where you have scratched. You are unusually thirsty or urinating more than normal. Your skin tingles or feels numb. Your skin or the white parts of your eyes turn yellow (jaundice). You feel weak. You have any of the following: Night sweats. Tiredness (fatigue). Weight loss. Abdominal pain. Summary Pruritus is an itchy feeling on the skin. One of the most common causes is dry skin, but many different conditions and factors can cause itching. Apply moisturizing creams to your skin frequently, at least twice daily. Apply immediately after bathing while skin is still wet. Take medicines or apply medicated creams only as told by your health care provider. Do not take hot showers or baths. Do not use scented soaps, detergents, perfumes, or cosmetic products. Keep a journal to help find out what is causing your itching. This information is not intended to replace advice given to you by your health care provider. Make sure you discuss any questions you have with your health care  provider. Document Revised: 12/23/2021 Document Reviewed: 12/23/2021 Elsevier Patient Education  2024 [employer].

## 2023-05-18 NOTE — Progress Notes (Signed)
Subjective:  Patient ID: Jeffrey Perry, male    DOB: 08-07-1946  Age: 77 y.o. MRN: [id number]  CC:  Chief Complaint  Patient presents with   Medical Management of Chronic Issues    Pt notes due for a repeat scan on spleen needs this ordered believes it was to be an [place]    Skin Problem    Notes since surgery has had bouts of itchiness all over his body didnt know if there was anything to do for that     HPI Jeffrey Perry presents for   Pruritus Has noticed various areas of body since surgery in March. Usually in upper shoulders or under chest. No rash. Tx: lotion otc - daily lotion.  Intermittent flairs, not daily. No hives. No oral/genital lesions or involvement. No interdigital involvement.  No change in soap/detergent/fabric softener. Has overall improved.   Abnormal spot on spleen. MRI on 01/31/23 at [facility], 1.7cm lesion in spleen. Appeared benign, option to repeat MRI in 6 months to document stability.   Prediabetes: Some weight loss after surgery, then gained some back. Still walking watching diet. Avoiding sugar beverages.    Lab Results  Component Value Date   HGBA1C 6.1 11/18/2022   Wt Readings from Last 3 Encounters:  05/18/23 237 lb (107.5 kg)  03/31/23 232 lb (105.2 kg)  03/25/23 233 lb (105.7 kg)   Lab Results  Component Value Date   CHOL 141 11/18/2022   HDL 32.90 (L) 11/18/2022   LDLCALC 84 11/18/2022   TRIG 120.0 11/18/2022   CHOLHDL 4 11/18/2022     Hypertension: With CKD, followed by nephrology.  Diltiazem 180mg  every day, losartan/hct 100/25mg  every day, potassium BID Alfuzosin at night, followed by urology - BPH, LUTS.  Prior elevated ast/alt - normalized on 3/25.   Home readings:120/70 range.  BP Readings from Last 3 Encounters:  05/18/23 128/76  03/31/23 137/85  03/25/23 128/80   Lab Results  Component Value Date   CREATININE 1.25 02/21/2023   Last vitamin D Lab Results  Component Value Date   VD25OH 33 04/25/2020  On supplement once  per month. Checked at nephrology - ok.        History Patient Active Problem List   Diagnosis Date Noted   Aortic root enlargement (HCC) 03/24/2023   Tricuspid valve insufficiency 09/25/2020   Palpitations 09/25/2020   CKD (chronic kidney disease), stage II 09/25/2020   Murmur 07/18/2017   Elevated LFTs    Abdominal pain 04/30/2016   Renal insufficiency 03/05/2013   HTN (hypertension) 03/06/2012   Past Medical History:  Diagnosis Date   Cataract    "SMALL ONE IN ONE EYE LEFT"   Chronic kidney disease    STAGE 2   Heart murmur    Hx of adenomatous polyp of colon 03/31/2023   Hypertension    Paget disease of bone    Questionable diagnosis.    Prostate cancer (HCC)    Sickle cell trait (HCC)    Tricuspid regurgitation    Past Surgical History:  Procedure Laterality Date   BLADDER SURGERY     CHOLECYSTECTOMY     COLONOSCOPY     FINGER GANGLION CYST EXCISION  2011   3rd finger right hand   HYDROCELE EXCISION  2012   PENILE PROSTHESIS IMPLANT     TRIGGER FINGER RELEASE Left 08/2021   thumb   Allergies  Allergen Reactions   Latex Itching   Lisinopril Cough   Neuromuscular Blocking Agents Swelling    Ankle  swelling Ankle swelling   Oxycodone Itching   Phenazopyridine Hcl Swelling    Ankle swelling   Pyridium [Phenazopyridine Hcl] Swelling    Ankle swelling   Sulfonamide Derivatives Itching   Prior to Admission medications   Medication Sig Start Date End Date Taking? Authorizing Provider  alfuzosin (UROXATRAL) 10 MG 24 hr tablet TAKE ONE TABLET BY MOUTH EVERY MORNING WITH BREAKFAST Patient taking differently: at bedtime. 11/18/22  Yes Shade Flood, MD  bevacizumab (AVASTIN) 2.5 mg/0.1 mL SOLN intravitreal injection 0.625 mg by Intravitreal route every 30 (thirty) days.   Yes [provider]  ciprofloxacin (CILOXAN) 0.3 % ophthalmic solution Place into the right eye every 30 (thirty) days. 10/09/22  Yes [provider]  diltiazem  (TIAZAC) 180 MG 24 hr capsule Take 1 capsule (180 mg total) by mouth at bedtime. 11/18/22  Yes Shade Flood, MD  losartan-hydrochlorothiazide (HYZAAR) 100-25 MG tablet Take 1 tablet by mouth daily. 11/18/22  Yes Shade Flood, MD  potassium chloride (KLOR-CON M) 10 MEQ tablet Take 1 tablet (10 mEq total) by mouth 2 (two) times daily. 11/18/22  Yes Shade Flood, MD  senna-docusate (SENOKOT-S) 8.6-50 MG tablet Take 1 tablet by mouth as needed for mild constipation. 02/03/23  Yes [provider]  Vitamin D, Ergocalciferol, 50000 units CAPS Take 50,000 Units by mouth every 30 (thirty) days. Patient not taking: Reported on 05/18/2023    [provider]   Social History   Socioeconomic History   Marital status: Married    Spouse name: Not on file   Number of children: Not on file   Years of education: Not on file   Highest education level: Doctorate  Occupational History   Occupation: professor    Employer: [employer]  Tobacco Use   Smoking status: Never    Passive exposure: Never   Smokeless tobacco: Never  Vaping Use   Vaping Use: Never used  Substance and Sexual Activity   Alcohol use: Yes    Comment: Beer occ   Drug use: No   Sexual activity: Yes    Partners: Female  Other Topics Concern   Not on file  Social History Narrative   Not on file   Social Determinants of Health   Financial Resource Strain: Low Risk  (02/17/2023)   Overall Financial Resource Strain (CARDIA)    Difficulty of Paying Living Expenses: Not hard at all  Food Insecurity: No Food Insecurity (02/17/2023)   Hunger Vital Sign    Worried About Running Out of Food in the Last Year: Never true    Ran Out of Food in the Last Year: Never true  Transportation Needs: No Transportation Needs (02/17/2023)   PRAPARE - Administrator, Civil Service (Medical): No    Lack of Transportation (Non-Medical): No  Physical Activity: Sufficiently Active (02/17/2023)   Exercise Vital Sign     Days of Exercise per Week: 7 days    Minutes of Exercise per Session: 70 min  Stress: No Stress Concern Present (02/17/2023)   [employer] of Occupational Health - Occupational Stress Questionnaire    Feeling of Stress : Not at all  Social Connections: Socially Integrated (02/17/2023)   Social Connection and Isolation Panel [NHANES]    Frequency of Communication with Friends and Family: More than three times a week    Frequency of Social Gatherings with Friends and Family: Twice a week    Attends Religious Services: More than 4 times per year  Active Member of Clubs or Organizations: Yes    Attends Banker Meetings: 1 to 4 times per year    Marital Status: Married  Catering manager Violence: Not At Risk (06/23/2022)   Humiliation, Afraid, Rape, and Kick questionnaire    Fear of Current or Ex-Partner: No    Emotionally Abused: No    Physically Abused: No    Sexually Abused: No    Review of Systems  Per HPI.  Objective:   Vitals:   05/18/23 0926  BP: 128/76  Pulse: (!) 54  Temp: 98.3 F (36.8 C)  TempSrc: Temporal  SpO2: 100%  Weight: 237 lb (107.5 kg)  Height: 6\' 1"  (1.854 m)     Physical Exam Vitals reviewed.  Constitutional:      Appearance: He is well-developed.  HENT:     Head: Normocephalic and atraumatic.  Neck:     Vascular: No carotid bruit or JVD.  Cardiovascular:     Rate and Rhythm: Normal rate and regular rhythm.     Heart sounds: Normal heart sounds. No murmur heard. Pulmonary:     Effort: Pulmonary effort is normal.     Breath sounds: Normal breath sounds. No rales.  Musculoskeletal:     Right lower leg: No edema.     Left lower leg: No edema.  Skin:    General: Skin is warm and dry.     Findings: No erythema, lesion or rash.     Comments: No rash.   Neurological:     Mental Status: He is alert and oriented to person, place, and time.  Psychiatric:        Mood and Affect: Mood normal.        Assessment & Plan:  Jeffrey Perry is a 77 y.o. male . Essential hypertension - Plan: Comprehensive metabolic panel, Lipid panel, losartan-hydrochlorothiazide (HYZAAR) 100-25 MG tablet, diltiazem (TIAZAC) 180 MG 24 hr capsule  -Overall stable on current regimen, will continue same.  Check labs and adjust plan accordingly  Hypokalemia - Plan: potassium chloride (KLOR-CON M) 10 MEQ tablet  -Continue supplementation, check labs and adjust accordingly  Pruritus  -No rash identified.  Less likely dry skin this time of year.  Overall has improved.  Option of Aveeno, Sarna lotion temporarily.  Could consider antihistamine but I am concerned that he may not tolerate that medication due to side effects and prostate issues previously.  RTC precautions if not continuing to improve.  Splenic lesion - Plan: MR Abdomen W Wo Contrast  -Lesion noted on MRI at [facility] 3 months ago.  Likely benign.  Ordered repeat MRI for October.  LFT elevation - Plan: Comprehensive metabolic panel  -Transient elevation previously, normalized at last visit, repeat testing.  Asymptomatic at this time.  Prediabetes - Plan: Lipid panel, HgB A1c  -Continue diet/exercise approach, check updated labs and adjust plan accordingly.  Meds ordered this encounter  Medications   losartan-hydrochlorothiazide (HYZAAR) 100-25 MG tablet    Sig: Take 1 tablet by mouth daily.    Dispense:  90 tablet    Refill:  1   potassium chloride (KLOR-CON M) 10 MEQ tablet    Sig: Take 1 tablet (10 mEq total) by mouth 2 (two) times daily.    Dispense:  180 tablet    Refill:  1   diltiazem (TIAZAC) 180 MG 24 hr capsule    Sig: Take 1 capsule (180 mg total) by mouth at bedtime.    Dispense:  90 capsule  Refill:  1   Patient Instructions   Aveeno lotion or over the counter Sarna lotion if needed for itching. Hydrocortisone also an option if needed short term. If that persists, we could consider med like claritin but that has some risks and side effects. Keep me posted.   I  will check labs, no med changes for now.  Blood pressure looks good today.  If any concerns on labs I will let you know.  I will also order the MRI for October for recheck of spleen but that appeared to be benign previously.  Thanks for coming in today, please let me know if there are any questions.  Pruritus Pruritus is an itchy feeling on the skin. One of the most common causes is dry skin, but many different things can cause itching. Most cases of itching do not require medical attention. Sometimes itchy skin can turn into a rash or a secondary infection. Follow these instructions at home: Skin care  Do not use scented soaps, detergents, perfumes, and cosmetic products. Instead, use gentle, unscented versions of these items. Apply moisturizing creams to your skin frequently, at least twice daily. Apply immediately after bathing while skin is still wet. Take medicines or apply medicated creams only as told by your health care provider. This may include: Corticosteroid cream or topical calcineurin inhibitor. Anti-itch lotions containing urea, camphor, or menthol. Oral antihistamines. Do not take hot showers or baths, which can make itching worse. A short, cool shower may help with itching as long as you apply moisturizing lotion after the shower. Apply a cool, wet cloth (cool compress) to the affected areas. You may take lukewarm baths with one of the following: Epsom salts. You can get these at your local pharmacy or grocery store. Follow the instructions on the packaging. Baking soda. Pour a small amount into the bath as told by your health care provider. Colloidal oatmeal. You can get this at your local pharmacy or grocery store. Follow the instructions on the packaging. Do not scratch your skin. General instructions Avoid wearing tight clothes. Keep a journal to help find out what is causing your itching. Write down: What you eat and drink. What cosmetic products you use. What soaps  or detergents you use. What you wear, including jewelry. Use a humidifier. This keeps the air moist, which helps to prevent dry skin. Be aware of any changes in your itchiness. Tell your health care provider about any changes. Contact a health care provider if: The itching does not go away after several days. You notice redness, warmth, or drainage on the skin where you have scratched. You are unusually thirsty or urinating more than normal. Your skin tingles or feels numb. Your skin or the white parts of your eyes turn yellow (jaundice). You feel weak. You have any of the following: Night sweats. Tiredness (fatigue). Weight loss. Abdominal pain. Summary Pruritus is an itchy feeling on the skin. One of the most common causes is dry skin, but many different conditions and factors can cause itching. Apply moisturizing creams to your skin frequently, at least twice daily. Apply immediately after bathing while skin is still wet. Take medicines or apply medicated creams only as told by your health care provider. Do not take hot showers or baths. Do not use scented soaps, detergents, perfumes, or cosmetic products. Keep a journal to help find out what is causing your itching. This information is not intended to replace advice given to you by your health care provider. Make sure  you discuss any questions you have with your health care provider. Document Revised: 12/23/2021 Document Reviewed: 12/23/2021 Elsevier Patient Education  2024 [employer]        Signed,   Meredith Staggers, MD [facility], [facility] 05/18/23 10:25 AM

## 2023-05-31 ENCOUNTER — Ambulatory Visit: Payer: Medicare PPO | Attending: Rheumatology | Admitting: Rheumatology

## 2023-05-31 ENCOUNTER — Encounter: Payer: Self-pay | Admitting: Rheumatology

## 2023-05-31 VITALS — BP 123/80 | HR 56 | Resp 17 | Ht 73.0 in | Wt 237.4 lb

## 2023-05-31 DIAGNOSIS — C61 Malignant neoplasm of prostate: Secondary | ICD-10-CM | POA: Diagnosis not present

## 2023-05-31 DIAGNOSIS — N1831 Chronic kidney disease, stage 3a: Secondary | ICD-10-CM

## 2023-05-31 DIAGNOSIS — I10 Essential (primary) hypertension: Secondary | ICD-10-CM | POA: Diagnosis not present

## 2023-05-31 DIAGNOSIS — Z9049 Acquired absence of other specified parts of digestive tract: Secondary | ICD-10-CM

## 2023-05-31 DIAGNOSIS — M889 Osteitis deformans of unspecified bone: Secondary | ICD-10-CM

## 2023-05-31 DIAGNOSIS — R748 Abnormal levels of other serum enzymes: Secondary | ICD-10-CM

## 2023-05-31 DIAGNOSIS — M25552 Pain in left hip: Secondary | ICD-10-CM | POA: Diagnosis not present

## 2023-06-06 ENCOUNTER — Encounter (INDEPENDENT_AMBULATORY_CARE_PROVIDER_SITE_OTHER): Payer: Medicare PPO | Admitting: Ophthalmology

## 2023-06-06 DIAGNOSIS — H318 Other specified disorders of choroid: Secondary | ICD-10-CM | POA: Diagnosis not present

## 2023-06-06 DIAGNOSIS — H353211 Exudative age-related macular degeneration, right eye, with active choroidal neovascularization: Secondary | ICD-10-CM

## 2023-06-06 DIAGNOSIS — H43813 Vitreous degeneration, bilateral: Secondary | ICD-10-CM

## 2023-06-06 DIAGNOSIS — H35033 Hypertensive retinopathy, bilateral: Secondary | ICD-10-CM | POA: Diagnosis not present

## 2023-06-06 DIAGNOSIS — I10 Essential (primary) hypertension: Secondary | ICD-10-CM | POA: Diagnosis not present

## 2023-06-06 DIAGNOSIS — H353122 Nonexudative age-related macular degeneration, left eye, intermediate dry stage: Secondary | ICD-10-CM | POA: Diagnosis not present

## 2023-06-08 ENCOUNTER — Telehealth: Payer: Self-pay | Admitting: Family Medicine

## 2023-06-08 NOTE — Telephone Encounter (Signed)
[facility] - Home Visit   Placed in front bin

## 2023-06-30 IMAGING — DX DG LUMBAR SPINE COMPLETE 4+V
5 series · 5 of 5 positions shown · non-contrast
Comparison: None.

CLINICAL DATA: Diffuse low back pain for the past 10 days

EXAM:
LUMBAR SPINE - COMPLETE 4+ VIEW

[l-spine ap]
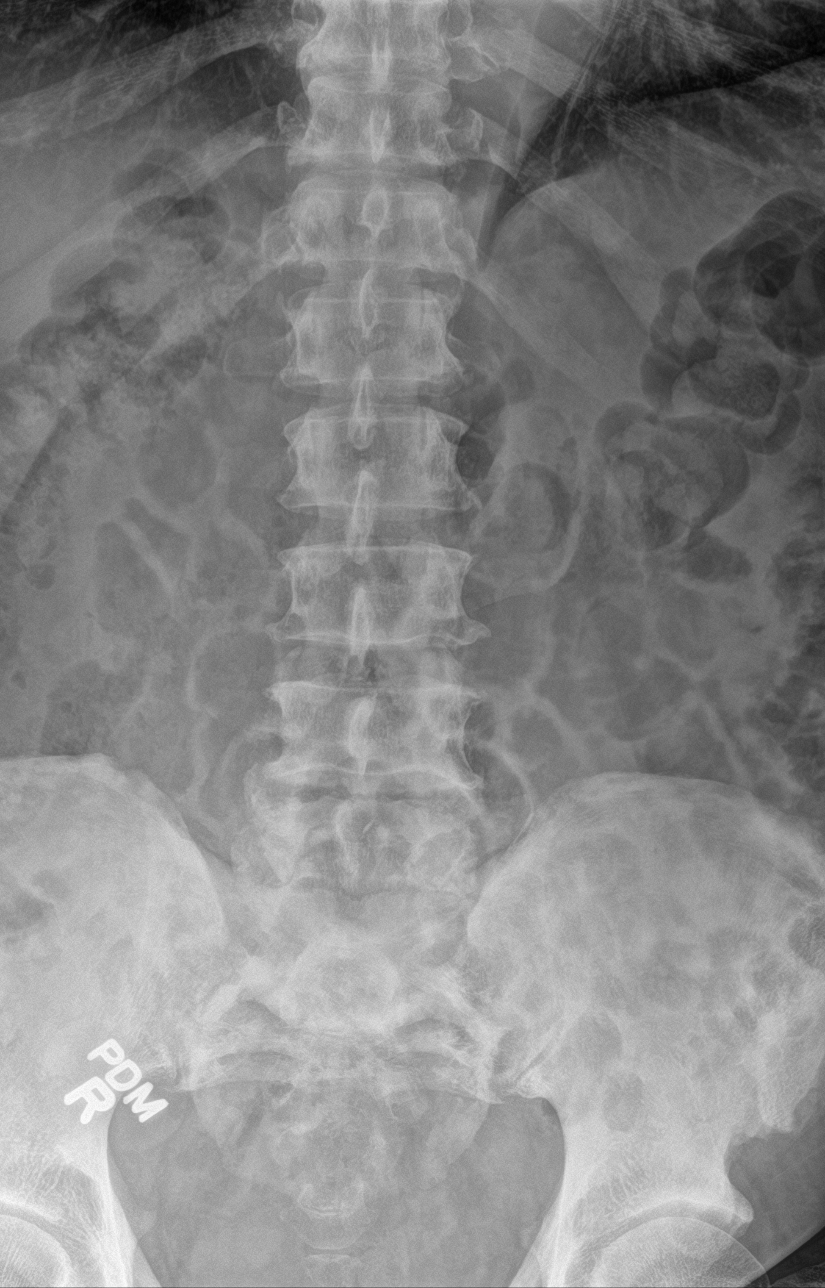

[l-spine obl (1 of 2)]
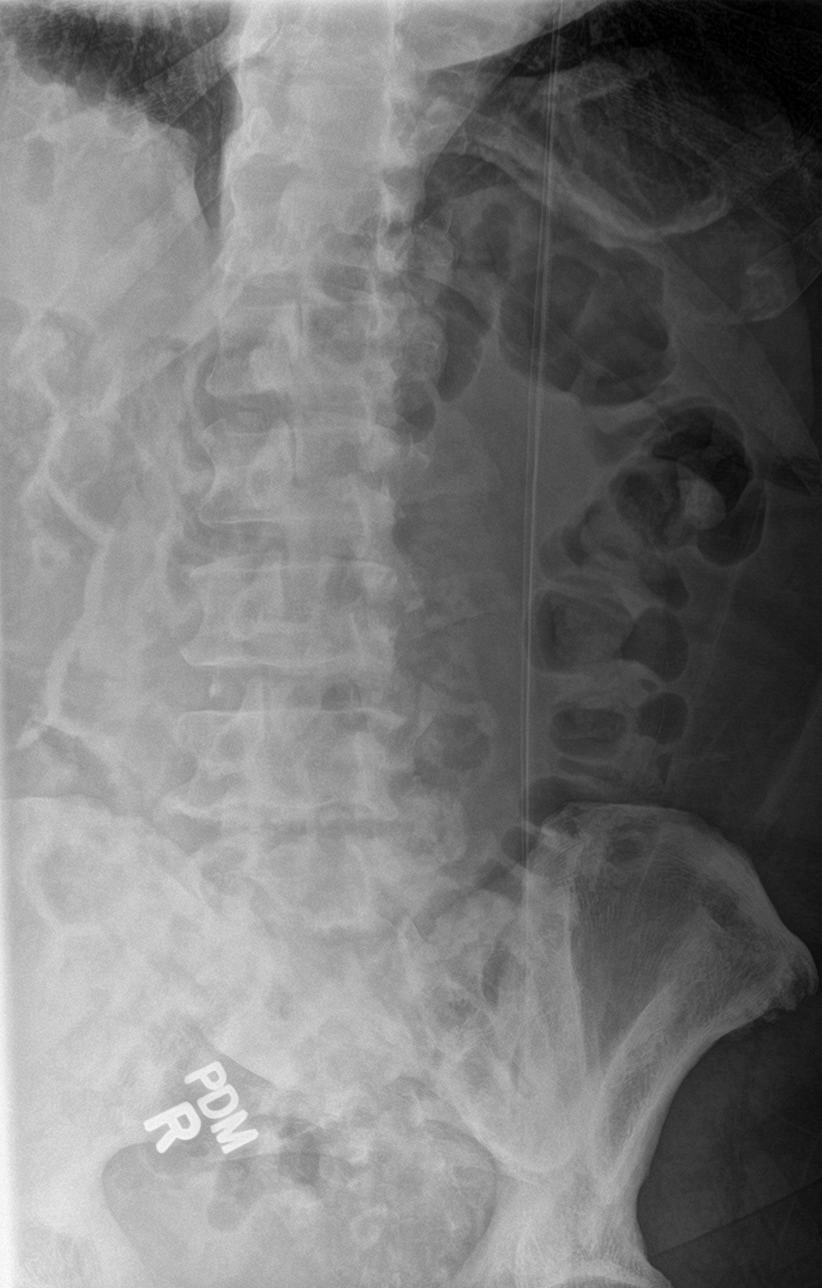

[l-spine obl (2 of 2)]
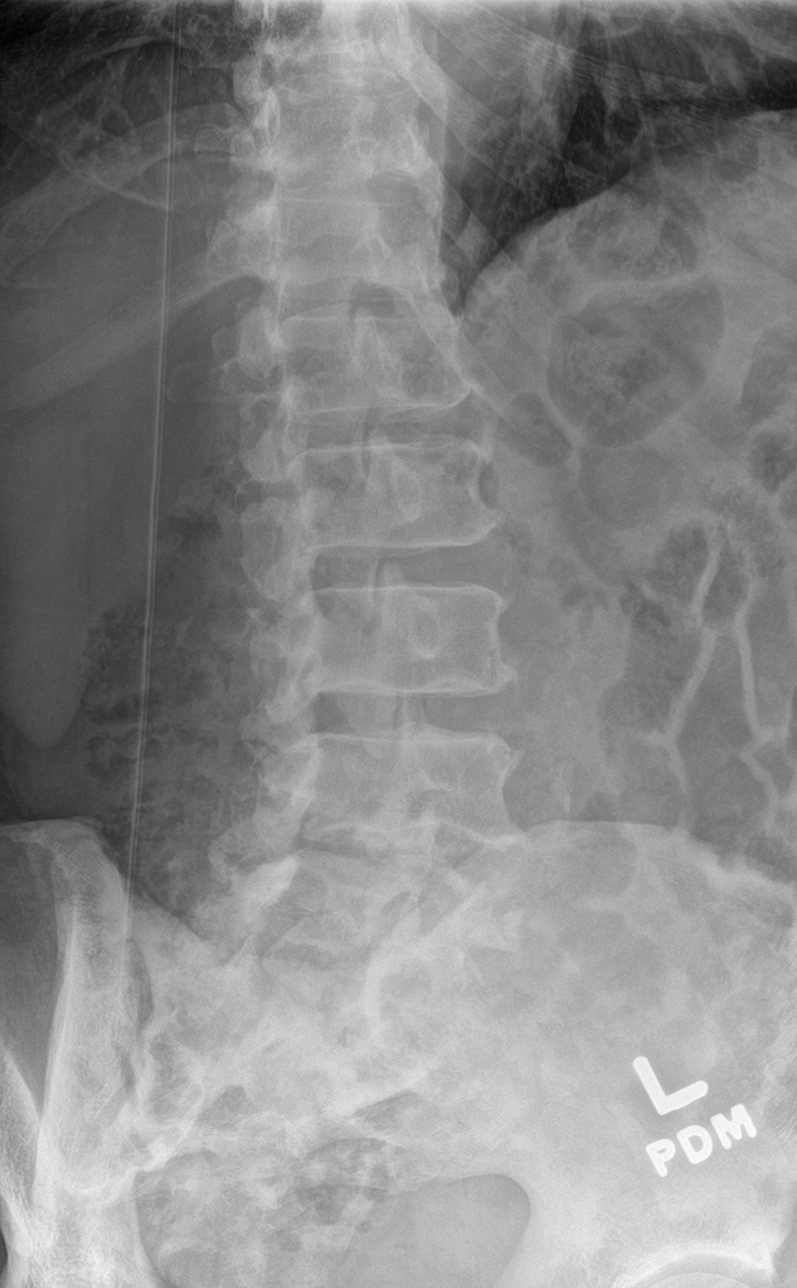

[l-spine lat]
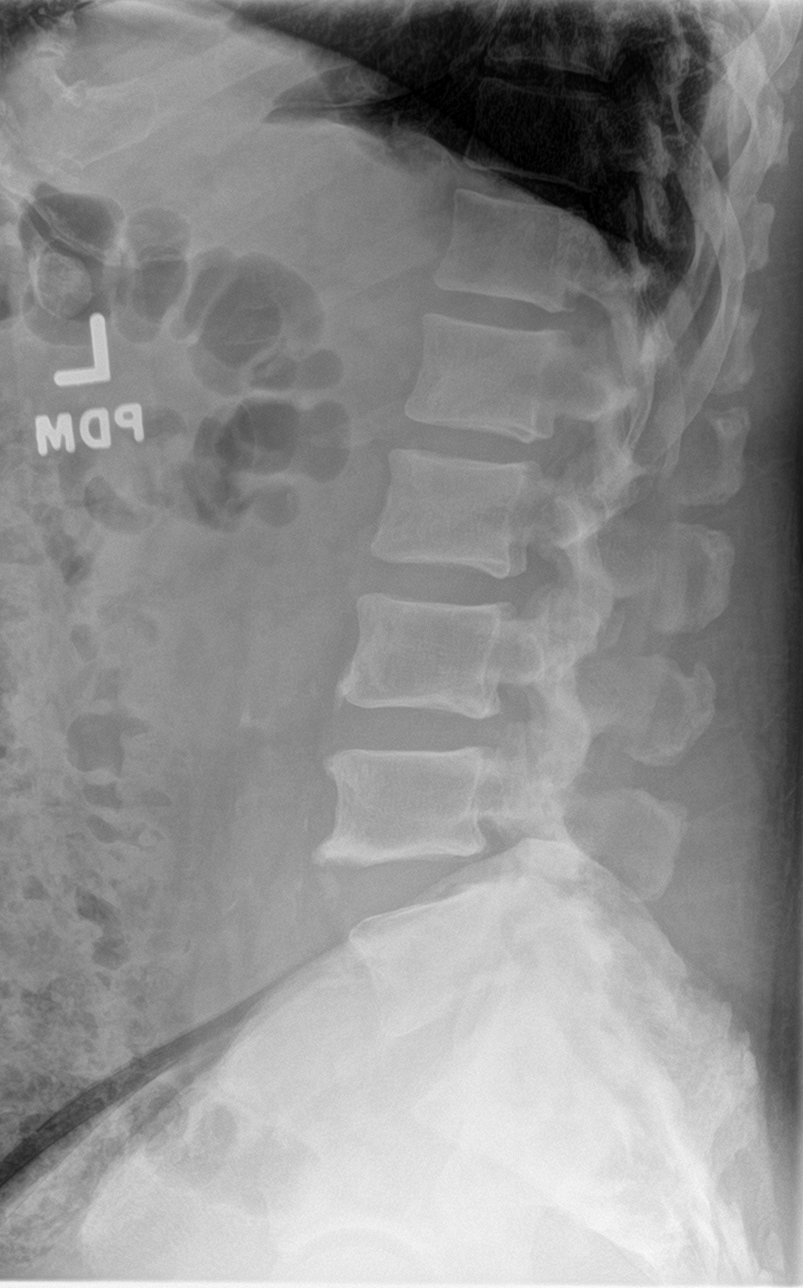

[l-spine spot]
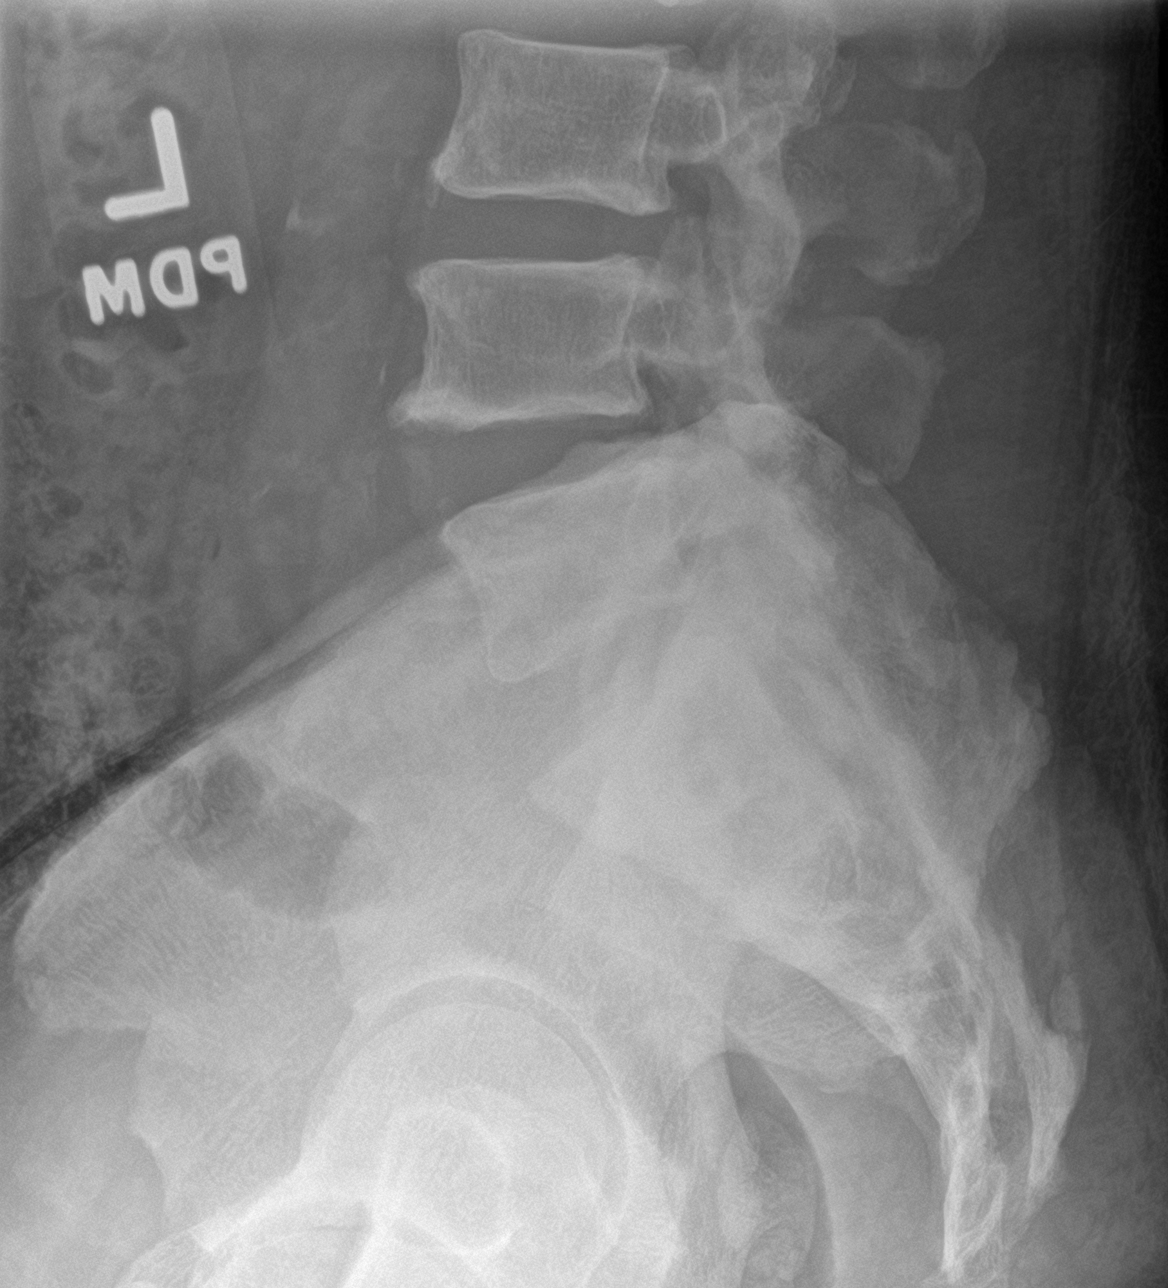

[5 of 5 positions shown; findings below may reference images not displayed]

FINDINGS: There is no evidence of lumbar spine fracture. Alignment is normal.
Intervertebral disc spaces are maintained. Atherosclerotic
calcifications visualized along the abdominal aorta.
IMPRESSION: Negative.

## 2023-09-05 ENCOUNTER — Encounter (INDEPENDENT_AMBULATORY_CARE_PROVIDER_SITE_OTHER): Payer: Medicare PPO | Admitting: Ophthalmology

## 2023-09-05 DIAGNOSIS — H353122 Nonexudative age-related macular degeneration, left eye, intermediate dry stage: Secondary | ICD-10-CM | POA: Diagnosis not present

## 2023-09-05 DIAGNOSIS — H43813 Vitreous degeneration, bilateral: Secondary | ICD-10-CM

## 2023-09-05 DIAGNOSIS — H2513 Age-related nuclear cataract, bilateral: Secondary | ICD-10-CM

## 2023-09-05 DIAGNOSIS — H35033 Hypertensive retinopathy, bilateral: Secondary | ICD-10-CM

## 2023-09-05 DIAGNOSIS — I10 Essential (primary) hypertension: Secondary | ICD-10-CM | POA: Diagnosis not present

## 2023-09-05 DIAGNOSIS — H353211 Exudative age-related macular degeneration, right eye, with active choroidal neovascularization: Secondary | ICD-10-CM | POA: Diagnosis not present

## 2023-09-08 ENCOUNTER — Ambulatory Visit
Admission: RE | Admit: 2023-09-08 | Discharge: 2023-09-08 | Disposition: A | Discharge status: Home / Self Care | Payer: Medicare PPO | Source: Ambulatory Visit | Attending: Family Medicine | Admitting: Family Medicine

## 2023-09-08 DIAGNOSIS — K5730 Diverticulosis of large intestine without perforation or abscess without bleeding: Secondary | ICD-10-CM | POA: Diagnosis not present

## 2023-09-08 DIAGNOSIS — D7389 Other diseases of spleen: Secondary | ICD-10-CM

## 2023-09-08 MED ORDER — GADOPICLENOL 0.5 MMOL/ML IV SOLN
10.0000 mL | Freq: Once | INTRAVENOUS | Status: AC | PRN
  Administered 2023-09-08: 10 mL via INTRAVENOUS

## 2023-09-15 ENCOUNTER — Ambulatory Visit (INDEPENDENT_AMBULATORY_CARE_PROVIDER_SITE_OTHER): Payer: Medicare PPO | Admitting: *Deleted

## 2023-09-15 ENCOUNTER — Telehealth: Payer: Self-pay | Admitting: Family Medicine

## 2023-09-15 DIAGNOSIS — Z0000 Encounter for general adult medical examination without abnormal findings: Secondary | ICD-10-CM | POA: Diagnosis not present

## 2023-09-15 NOTE — Telephone Encounter (Signed)
Please clarify reason for MRI not being read from 7 days ago and have radiology read and send me report - patient asked about results at [facility]  visit today. Thanks.

## 2023-09-15 NOTE — Patient Instructions (Signed)
Jeffrey Perry , Thank you for taking time to come for your Medicare Wellness Visit. I appreciate your ongoing commitment to your health goals. Please review the following plan we discussed and let me know if I can assist you in the future.   Screening recommendations/referrals: Colonoscopy: no longer required Recommended yearly ophthalmology/optometry visit for glaucoma screening and checkup Recommended yearly dental visit for hygiene and checkup  Vaccinations: Influenza vaccine: Education provided Pneumococcal vaccine: up to date Tdap vaccine: up to date Shingles vaccine: Education provided    Advanced directives: Education provided    Preventive Care 65 Years and Older, Male Preventive care refers to lifestyle choices and visits with your health care provider that can promote health and wellness. What does preventive care include? A yearly physical exam. This is also called an annual well check. Dental exams once or twice a year. Routine eye exams. Ask your health care provider how often you should have your eyes checked. Personal lifestyle choices, including: Daily care of your teeth and gums. Regular physical activity. Eating a healthy diet. Avoiding tobacco and drug use. Limiting alcohol use. Practicing safe sex. Taking low doses of aspirin every day. Taking vitamin and mineral supplements as recommended by your health care provider. What happens during an annual well check? The services and screenings done by your health care provider during your annual well check will depend on your age, overall health, lifestyle risk factors, and family history of disease. Counseling  Your health care provider may ask you questions about your: Alcohol use. Tobacco use. Drug use. Emotional well-being. Home and relationship well-being. Sexual activity. Eating habits. History of falls. Memory and ability to understand (cognition). Work and work Astronomer. Screening  You may have the  following tests or measurements: Height, weight, and BMI. Blood pressure. Lipid and cholesterol levels. These may be checked every 5 years, or more frequently if you are over 12 years old. Skin check. Lung cancer screening. You may have this screening every year starting at age 60 if you have a 30-pack-year history of smoking and currently smoke or have quit within the past 15 years. Fecal occult blood test (FOBT) of the stool. You may have this test every year starting at age 24. Flexible sigmoidoscopy or colonoscopy. You may have a sigmoidoscopy every 5 years or a colonoscopy every 10 years starting at age 8. Prostate cancer screening. Recommendations will vary depending on your family history and other risks. Hepatitis C blood test. Hepatitis B blood test. Sexually transmitted disease (STD) testing. Diabetes screening. This is done by checking your blood sugar (glucose) after you have not eaten for a while (fasting). You may have this done every 1-3 years. Abdominal aortic aneurysm (AAA) screening. You may need this if you are a current or former smoker. Osteoporosis. You may be screened starting at age 17 if you are at high risk. Talk with your health care provider about your test results, treatment options, and if necessary, the need for more tests. Vaccines  Your health care provider may recommend certain vaccines, such as: Influenza vaccine. This is recommended every year. Tetanus, diphtheria, and acellular pertussis (Tdap, Td) vaccine. You may need a Td booster every 10 years. Zoster vaccine. You may need this after age 3. Pneumococcal 13-valent conjugate (PCV13) vaccine. One dose is recommended after age 49. Pneumococcal polysaccharide (PPSV23) vaccine. One dose is recommended after age 59. Talk to your health care provider about which screenings and vaccines you need and how often you need them. This information  is not intended to replace advice given to you by your health care  provider. Make sure you discuss any questions you have with your health care provider. Document Released: 12/12/2015 Document Revised: 08/04/2016 Document Reviewed: 09/16/2015 Elsevier Interactive Patient Education  2017 [employer].  Fall Prevention in the Home Falls can cause injuries. They can happen to people of all ages. There are many things you can do to make your home safe and to help prevent falls. What can I do on the outside of my home? Regularly fix the edges of walkways and driveways and fix any cracks. Remove anything that might make you trip as you walk through a door, such as a raised step or threshold. Trim any bushes or trees on the path to your home. Use bright outdoor lighting. Clear any walking paths of anything that might make someone trip, such as rocks or tools. Regularly check to see if handrails are loose or broken. Make sure that both sides of any steps have handrails. Any raised decks and porches should have guardrails on the edges. Have any leaves, snow, or ice cleared regularly. Use sand or salt on walking paths during winter. Clean up any spills in your garage right away. This includes oil or grease spills. What can I do in the bathroom? Use night lights. Install grab bars by the toilet and in the tub and shower. Do not use towel bars as grab bars. Use non-skid mats or decals in the tub or shower. If you need to sit down in the shower, use a plastic, non-slip stool. Keep the floor dry. Clean up any water that spills on the floor as soon as it happens. Remove soap buildup in the tub or shower regularly. Attach bath mats securely with double-sided non-slip rug tape. Do not have throw rugs and other things on the floor that can make you trip. What can I do in the bedroom? Use night lights. Make sure that you have a light by your bed that is easy to reach. Do not use any sheets or blankets that are too big for your bed. They should not hang down onto the  floor. Have a firm chair that has side arms. You can use this for support while you get dressed. Do not have throw rugs and other things on the floor that can make you trip. What can I do in the kitchen? Clean up any spills right away. Avoid walking on wet floors. Keep items that you use a lot in easy-to-reach places. If you need to reach something above you, use a strong step stool that has a grab bar. Keep electrical cords out of the way. Do not use floor polish or wax that makes floors slippery. If you must use wax, use non-skid floor wax. Do not have throw rugs and other things on the floor that can make you trip. What can I do with my stairs? Do not leave any items on the stairs. Make sure that there are handrails on both sides of the stairs and use them. Fix handrails that are broken or loose. Make sure that handrails are as long as the stairways. Check any carpeting to make sure that it is firmly attached to the stairs. Fix any carpet that is loose or worn. Avoid having throw rugs at the top or bottom of the stairs. If you do have throw rugs, attach them to the floor with carpet tape. Make sure that you have a light switch at the top of  the stairs and the bottom of the stairs. If you do not have them, ask someone to add them for you. What else can I do to help prevent falls? Wear shoes that: Do not have high heels. Have rubber bottoms. Are comfortable and fit you well. Are closed at the toe. Do not wear sandals. If you use a stepladder: Make sure that it is fully opened. Do not climb a closed stepladder. Make sure that both sides of the stepladder are locked into place. Ask someone to hold it for you, if possible. Clearly mark and make sure that you can see: Any grab bars or handrails. First and last steps. Where the edge of each step is. Use tools that help you move around (mobility aids) if they are needed. These include: Canes. Walkers. Scooters. Crutches. Turn on the  lights when you go into a dark area. Replace any light bulbs as soon as they burn out. Set up your furniture so you have a clear path. Avoid moving your furniture around. If any of your floors are uneven, fix them. If there are any pets around you, be aware of where they are. Review your medicines with your doctor. Some medicines can make you feel dizzy. This can increase your chance of falling. Ask your doctor what other things that you can do to help prevent falls. This information is not intended to replace advice given to you by your health care provider. Make sure you discuss any questions you have with your health care provider. Document Released: 09/11/2009 Document Revised: 04/22/2016 Document Reviewed: 12/20/2014 Elsevier Interactive Patient Education  2017 [employer].

## 2023-09-15 NOTE — Progress Notes (Signed)
Subjective:   Jeffrey Perry is a 77 y.o. male who presents for Medicare Annual/Subsequent preventive examination.  Visit Complete: Virtual I connected with  Jeffrey Perry on 09/15/23 by a audio enabled telemedicine application and verified that I am speaking with the correct person using two identifiers.  Patient Location: Home  Provider Location: Home Office  I discussed the limitations of evaluation and management by telemedicine. The patient expressed understanding and agreed to proceed.  Vital Signs: Because this visit was a virtual/telehealth visit, some criteria may be missing or patient reported. Any vitals not documented were not able to be obtained and vitals that have been documented are patient reported.  Patient Medicare AWV questionnaire was completed by the patient on 09-14-2023; I have confirmed that all information answered by patient is correct and no changes since this date.  Cardiac Risk Factors include: advanced age (>43men, >21 women);male gender;hypertension     Objective:    There were no vitals filed for this visit. There is no height or weight on file to calculate BMI.     09/15/2023    9:35 AM 01/28/2023    8:57 AM 01/26/2023   10:33 PM 06/23/2022    1:58 PM 02/07/2020    8:22 AM 05/07/2016   12:18 PM 05/02/2016    8:30 PM  Advanced Directives  Does Patient Have a Medical Advance Directive? No No No Yes No No No  Type of Theme park manager;Living will Living will;Healthcare Power of Attorney    Does patient want to make changes to medical advance directive?     Yes (Inpatient - patient requests chaplain consult to change a medical advance directive)    Copy of Healthcare Power of Attorney in Chart?    No - copy requested No - copy requested    Would patient like information on creating a medical advance directive? No - Patient declined  No - Patient declined  Yes (Inpatient - patient requests chaplain consult to create a medical  advance directive) No - patient declined information No - patient declined information    Current Medications (verified) Outpatient Encounter Medications as of 09/15/2023  Medication Sig   alfuzosin (UROXATRAL) 10 MG 24 hr tablet TAKE ONE TABLET BY MOUTH EVERY MORNING WITH BREAKFAST (Patient taking differently: at bedtime.)   bevacizumab (AVASTIN) 2.5 mg/0.1 mL SOLN intravitreal injection 0.625 mg by Intravitreal route every 30 (thirty) days.   ciprofloxacin (CILOXAN) 0.3 % ophthalmic solution Place into the right eye every 30 (thirty) days.   diltiazem (TIAZAC) 180 MG 24 hr capsule Take 1 capsule (180 mg total) by mouth at bedtime.   losartan-hydrochlorothiazide (HYZAAR) 100-25 MG tablet Take 1 tablet by mouth daily.   potassium chloride (KLOR-CON M) 10 MEQ tablet Take 1 tablet (10 mEq total) by mouth 2 (two) times daily.   senna-docusate (SENOKOT-S) 8.6-50 MG tablet Take 1 tablet by mouth as needed for mild constipation.   Vitamin D, Ergocalciferol, 50000 units CAPS Take 50,000 Units by mouth every 30 (thirty) days.   No facility-administered encounter medications on file as of 09/15/2023.    Allergies (verified) Latex, Lisinopril, Neuromuscular blocking agents, Oxycodone, Phenazopyridine hcl, Pyridium [phenazopyridine hcl], and Sulfonamide derivatives   History: Past Medical History:  Diagnosis Date   Cataract    "SMALL ONE IN ONE EYE LEFT"   Chronic kidney disease    STAGE 2   Heart murmur    Hx of adenomatous polyp of colon 03/31/2023   Hypertension  Paget disease of bone    Questionable diagnosis.    Prostate cancer (HCC)    Sickle cell trait (HCC)    Tricuspid regurgitation    Past Surgical History:  Procedure Laterality Date   BLADDER SURGERY     CHOLECYSTECTOMY  02/02/2023   COLONOSCOPY     FINGER GANGLION CYST EXCISION  2011   3rd finger right hand   HYDROCELE EXCISION  2012   PENILE PROSTHESIS IMPLANT     TRIGGER FINGER RELEASE Left 08/2021   thumb    Family History  Problem Relation Age of Onset   Hypertension Mother    Hypertension Sister    Stroke Sister    Hypertension Sister    Hypertension Brother    Hypertension Brother    Colon cancer Neg Hx    Colon polyps Neg Hx    Crohn's disease Neg Hx    Esophageal cancer Neg Hx    Rectal cancer Neg Hx    Stomach cancer Neg Hx    Ulcerative colitis Neg Hx    Social History   Socioeconomic History   Marital status: Married    Spouse name: Not on file   Number of children: Not on file   Years of education: Not on file   Highest education level: Doctorate  Occupational History   Occupation: professor    Employer: [employer]  Tobacco Use   Smoking status: Never    Passive exposure: Never   Smokeless tobacco: Never  Vaping Use   Vaping status: Never Used  Substance and Sexual Activity   Alcohol use: Yes    Comment: Beer occ   Drug use: No   Sexual activity: Yes    Partners: Female  Other Topics Concern   Not on file  Social History Narrative   Not on file   Social Determinants of Health   Financial Resource Strain: Low Risk  (09/15/2023)   Overall Financial Resource Strain (CARDIA)    Difficulty of Paying Living Expenses: Not hard at all  Food Insecurity: No Food Insecurity (09/15/2023)   Hunger Vital Sign    Worried About Running Out of Food in the Last Year: Never true    Ran Out of Food in the Last Year: Never true  Transportation Needs: No Transportation Needs (09/15/2023)   PRAPARE - Administrator, Civil Service (Medical): No    Lack of Transportation (Non-Medical): No  Physical Activity: Sufficiently Active (09/15/2023)   Exercise Vital Sign    Days of Exercise per Week: 3 days    Minutes of Exercise per Session: 60 min  Stress: No Stress Concern Present (09/15/2023)   [employer] of Occupational Health - Occupational Stress Questionnaire    Feeling of Stress : Not at all  Social Connections: Socially Integrated (09/15/2023)   Social  Connection and Isolation Panel [NHANES]    Frequency of Communication with Friends and Family: More than three times a week    Frequency of Social Gatherings with Friends and Family: More than three times a week    Attends Religious Services: 1 to 4 times per year    Active Member of [employer] or Organizations: Yes    Attends Banker Meetings: Never    Marital Status: Married    Tobacco Counseling Counseling given: Not Answered   Clinical Intake:  Pre-visit preparation completed: Yes  Pain : No/denies pain     Diabetes: No  How often do you need to have someone help you when you  read instructions, pamphlets, or other written materials from your doctor or pharmacy?: 1 - Never  Interpreter Needed?: No  Information entered by :: Remi Haggard LPN   Activities of Daily Living    09/15/2023    9:38 AM 09/14/2023   11:53 AM  In your present state of health, do you have any difficulty performing the following activities:  Hearing? 0 0  Vision? 0 0  Difficulty concentrating or making decisions? 0 0  Walking or climbing stairs? 0 0  Dressing or bathing? 0 0  Doing errands, shopping? 0 0  Preparing Food and eating ? N N  Using the Toilet? N N  In the past six months, have you accidently leaked urine? N N  Do you have problems with loss of bowel control? N N  Managing your Medications? N N  Managing your Finances? N N  Housekeeping or managing your Housekeeping? N N    Patient Care Team: Shade Flood, MD as PCP - General (Family Medicine) Rollene Rotunda, MD as PCP - Cardiology (Cardiology) Janalyn Harder, MD (Inactive) as Consulting Physician (Dermatology)  Indicate any recent Medical Services you may have received from other than [facility] providers in the past year (date may be approximate).     Assessment:   This is a routine wellness examination for [place].  Hearing/Vision screen Hearing Screening - Comments:: No trouble hearing Vision Screening -  Comments:: Up to date [facility]   Goals Addressed             This Visit's Progress    Patient Stated       Continue current lifestyle       Depression Screen    09/15/2023    9:49 AM 05/18/2023    9:24 AM 02/09/2023    2:12 PM 11/18/2022    2:35 PM 06/23/2022    1:59 PM 06/23/2022    1:57 PM 03/22/2022    1:20 PM  PHQ 2/9 Scores  PHQ - 2 Score 0 0 0 0 0 0 0  PHQ- 9 Score 0 0 2 1   1     Fall Risk    09/15/2023    9:35 AM 09/14/2023   11:53 AM 05/18/2023    9:23 AM 02/09/2023    2:12 PM 11/18/2022    2:35 PM  Fall Risk   Falls in the past year? 0 0 0 0 1  Number falls in past yr: 0  0 0 0  Injury with Fall? 0  0 0 0  Risk for fall due to :   No Fall Risks No Fall Risks History of fall(s)  Follow up Falls evaluation completed;Education provided;Falls prevention discussed  Falls evaluation completed Falls evaluation completed Falls evaluation completed    MEDICARE RISK AT HOME: Medicare Risk at Home Any stairs in or around the home?: Yes If so, are there any without handrails?: No Home free of loose throw rugs in walkways, pet beds, electrical cords, etc?: Yes Adequate lighting in your home to reduce risk of falls?: Yes Life alert?: No Use of a cane, walker or w/c?: No Grab bars in the bathroom?: No Shower chair or bench in shower?: Yes Elevated toilet seat or a handicapped toilet?: Yes  TIMED UP AND GO:  Was the test performed?  No    Cognitive Function:        02/07/2020    8:18 AM  6CIT Screen  What Year? 0 points  What month? 0 points  What time? 0 points  Count back from 20 0 points  Months in reverse 0 points  Repeat phrase 0 points  Total Score 0 points    Immunizations Immunization History  Administered Date(s) Administered   DTaP 03/22/2008   Gamma Globulin 06/08/1994   H1N1 10/03/2004   Hepatitis A 03/29/1996, 06/10/1998   Hepatitis B 03/22/2008, 10/07/2008   IPV 03/18/2003   Meningococcal Conjugate 03/22/2008   PFIZER(Purple  Top)SARS-COV-2 Vaccination 01/20/2020, 02/12/2020, 07/30/2020   Pneumococcal Conjugate-13 05/02/2010, 01/31/2019   Pneumococcal Polysaccharide-23 03/04/2014   Pneumococcal-Unspecified 05/02/2010   Tdap 03/22/2008, 03/24/2018   Typhoid Parenteral 05/29/2002   Yellow Fever 03/22/2008   Zoster, Live 03/29/2010    TDAP status: Up to date  Flu Vaccine status: Due, Education has been provided regarding the importance of this vaccine. Advised may receive this vaccine at local pharmacy or Health Dept. Aware to provide a copy of the vaccination record if obtained from local pharmacy or Health Dept. Verbalized acceptance and understanding.  Pneumococcal vaccine status: Up to date  Covid-19 vaccine status: Information provided on how to obtain vaccines.   Qualifies for Shingles Vaccine? Yes   Zostavax completed Yes   Shingrix Completed?: No.    Education has been provided regarding the importance of this vaccine. Patient has been advised to call insurance company to determine out of pocket expense if they have not yet received this vaccine. Advised may also receive vaccine at local pharmacy or Health Dept. Verbalized acceptance and understanding.  Screening Tests Health Maintenance  Topic Date Due   COVID-19 Vaccine (4 - 2023-24 season) 10/01/2023 (Originally 07/31/2023)   Zoster Vaccines- Shingrix (1 of 2) 12/16/2023 (Originally 01/17/1965)   Medicare Annual Wellness (AWV)  09/14/2024   DTaP/Tdap/Td (4 - Td or Tdap) 03/24/2028   Colonoscopy  03/30/2033   Pneumonia Vaccine 24+ Years old  Completed   Hepatitis C Screening  Completed   HPV VACCINES  Aged Out   INFLUENZA VACCINE  Discontinued    Health Maintenance  There are no preventive care reminders to display for this patient.   Colorectal cancer screening: No longer required.   Lung Cancer Screening: (Low Dose CT Chest recommended if Age 75-80 years, 20 pack-year currently smoking OR have quit w/in 15years.) does not qualify.   Lung  Cancer Screening Referral:   Additional Screening:  Hepatitis C Screening: does not qualify; Completed 2024  Vision Screening: Recommended annual ophthalmology exams for early detection of glaucoma and other disorders of the eye. Is the patient up to date with their annual eye exam?  Yes  Who is the provider or what is the name of the office in which the patient attends annual eye exams? [facility] If pt is not established with a provider, would they like to be referred to a provider to establish care? No .   Dental Screening: Recommended annual dental exams for proper oral hygiene   Community Resource Referral / Chronic Care Management: CRR required this visit?  No   CCM required this visit?  No     Plan:     I have personally reviewed and noted the following in the patient's chart:   Medical and social history Use of alcohol, tobacco or illicit drugs  Current medications and supplements including opioid prescriptions. Patient is not currently taking opioid prescriptions. Functional ability and status Nutritional status Physical activity Advanced directives List of other physicians Hospitalizations, surgeries, and ER visits in previous 12 months Vitals Screenings to include cognitive, depression, and falls Referrals and appointments  In addition, I  have reviewed and discussed with patient certain preventive protocols, quality metrics, and best practice recommendations. A written personalized care plan for preventive services as well as general preventive health recommendations were provided to patient.     Remi Haggard, LPN   [id number]   After Visit Summary: (MyChart) Due to this being a telephonic visit, the after visit summary with patients personalized plan was offered to patient via MyChart   Nurse Notes:    Patient would like results of MRI sent to his my chart   09-08-2023 MRI

## 2023-09-16 NOTE — Telephone Encounter (Signed)
Spoke with Jasmine December at Radiology. She states they will have this over soon. She states the hold up was priors.

## 2023-09-16 NOTE — Telephone Encounter (Signed)
Noted  

## 2023-09-19 ENCOUNTER — Encounter: Payer: Self-pay | Admitting: Family Medicine

## 2023-09-20 NOTE — Progress Notes (Signed)
Subjective:   Jeffrey Perry is a 77 y.o. male who presents for Medicare Annual/Subsequent preventive examination.  Visit Complete: Virtual I connected with  Jeffrey Perry on 09-15-2023 by a audio enabled telemedicine application and verified that I am speaking with the correct person using two identifiers.  Patient Location: Home  Provider Location: Home Office  I discussed the limitations of evaluation and management by telemedicine. The patient expressed understanding and agreed to proceed.  Vital Signs: Because this visit was a virtual/telehealth visit, some criteria may be missing or patient reported. Any vitals not documented were not able to be obtained and vitals that have been documented are patient reported.  Patient Medicare AWV questionnaire was completed by the patient on 09-14-2023; I have confirmed that all information answered by patient is correct and no changes since this date.  Cardiac Risk Factors include: advanced age (>91men, >7 women);male gender;hypertension     Objective:    There were no vitals filed for this visit. There is no height or weight on file to calculate BMI.     09/15/2023    9:35 AM 01/28/2023    8:57 AM 01/26/2023   10:33 PM 06/23/2022    1:58 PM 02/07/2020    8:22 AM 05/07/2016   12:18 PM 05/02/2016    8:30 PM  Advanced Directives  Does Patient Have a Medical Advance Directive? No No No Yes No No No  Type of Theme park manager;Living will Living will;Healthcare Power of Attorney    Does patient want to make changes to medical advance directive?     Yes (Inpatient - patient requests chaplain consult to change a medical advance directive)    Copy of Healthcare Power of Attorney in Chart?    No - copy requested No - copy requested    Would patient like information on creating a medical advance directive? No - Patient declined  No - Patient declined  Yes (Inpatient - patient requests chaplain consult to create a medical  advance directive) No - patient declined information No - patient declined information    Current Medications (verified) Outpatient Encounter Medications as of 09/15/2023  Medication Sig   alfuzosin (UROXATRAL) 10 MG 24 hr tablet TAKE ONE TABLET BY MOUTH EVERY MORNING WITH BREAKFAST (Patient taking differently: at bedtime.)   bevacizumab (AVASTIN) 2.5 mg/0.1 mL SOLN intravitreal injection 0.625 mg by Intravitreal route every 30 (thirty) days.   ciprofloxacin (CILOXAN) 0.3 % ophthalmic solution Place into the right eye every 30 (thirty) days.   diltiazem (TIAZAC) 180 MG 24 hr capsule Take 1 capsule (180 mg total) by mouth at bedtime.   losartan-hydrochlorothiazide (HYZAAR) 100-25 MG tablet Take 1 tablet by mouth daily.   potassium chloride (KLOR-CON M) 10 MEQ tablet Take 1 tablet (10 mEq total) by mouth 2 (two) times daily.   senna-docusate (SENOKOT-S) 8.6-50 MG tablet Take 1 tablet by mouth as needed for mild constipation.   Vitamin D, Ergocalciferol, 50000 units CAPS Take 50,000 Units by mouth every 30 (thirty) days.   No facility-administered encounter medications on file as of 09/15/2023.    Allergies (verified) Latex, Lisinopril, Neuromuscular blocking agents, Oxycodone, Phenazopyridine hcl, Pyridium [phenazopyridine hcl], and Sulfonamide derivatives   History: Past Medical History:  Diagnosis Date   Cataract    "SMALL ONE IN ONE EYE LEFT"   Chronic kidney disease    STAGE 2   Heart murmur    Hx of adenomatous polyp of colon 03/31/2023   Hypertension  Paget disease of bone    Questionable diagnosis.    Prostate cancer (HCC)    Sickle cell trait (HCC)    Tricuspid regurgitation    Past Surgical History:  Procedure Laterality Date   BLADDER SURGERY     CHOLECYSTECTOMY  02/02/2023   COLONOSCOPY     FINGER GANGLION CYST EXCISION  2011   3rd finger right hand   HYDROCELE EXCISION  2012   PENILE PROSTHESIS IMPLANT     TRIGGER FINGER RELEASE Left 08/2021   thumb    Family History  Problem Relation Age of Onset   Hypertension Mother    Hypertension Sister    Stroke Sister    Hypertension Sister    Hypertension Brother    Hypertension Brother    Colon cancer Neg Hx    Colon polyps Neg Hx    Crohn's disease Neg Hx    Esophageal cancer Neg Hx    Rectal cancer Neg Hx    Stomach cancer Neg Hx    Ulcerative colitis Neg Hx    Social History   Socioeconomic History   Marital status: Married    Spouse name: Not on file   Number of children: Not on file   Years of education: Not on file   Highest education level: Doctorate  Occupational History   Occupation: professor    Employer: [employer]  Tobacco Use   Smoking status: Never    Passive exposure: Never   Smokeless tobacco: Never  Vaping Use   Vaping status: Never Used  Substance and Sexual Activity   Alcohol use: Yes    Comment: Beer occ   Drug use: No   Sexual activity: Yes    Partners: Female  Other Topics Concern   Not on file  Social History Narrative   Not on file   Social Determinants of Health   Financial Resource Strain: Low Risk  (09/15/2023)   Overall Financial Resource Strain (CARDIA)    Difficulty of Paying Living Expenses: Not hard at all  Food Insecurity: No Food Insecurity (09/15/2023)   Hunger Vital Sign    Worried About Running Out of Food in the Last Year: Never true    Ran Out of Food in the Last Year: Never true  Transportation Needs: No Transportation Needs (09/15/2023)   PRAPARE - Administrator, Civil Service (Medical): No    Lack of Transportation (Non-Medical): No  Physical Activity: Sufficiently Active (09/15/2023)   Exercise Vital Sign    Days of Exercise per Week: 3 days    Minutes of Exercise per Session: 60 min  Stress: No Stress Concern Present (09/15/2023)   [employer] of Occupational Health - Occupational Stress Questionnaire    Feeling of Stress : Not at all  Social Connections: Socially Integrated (09/15/2023)   Social  Connection and Isolation Panel [NHANES]    Frequency of Communication with Friends and Family: More than three times a week    Frequency of Social Gatherings with Friends and Family: More than three times a week    Attends Religious Services: 1 to 4 times per year    Active Member of [employer] or Organizations: Yes    Attends Banker Meetings: Never    Marital Status: Married    Tobacco Counseling Counseling given: Not Answered   Clinical Intake:  Pre-visit preparation completed: Yes  Pain : No/denies pain     Diabetes: No  How often do you need to have someone help you when you  read instructions, pamphlets, or other written materials from your doctor or pharmacy?: 1 - Never  Interpreter Needed?: No  Information entered by :: Remi Haggard LPN   Activities of Daily Living    09/15/2023    9:38 AM 09/14/2023   11:53 AM  In your present state of health, do you have any difficulty performing the following activities:  Hearing? 0 0  Vision? 0 0  Difficulty concentrating or making decisions? 0 0  Walking or climbing stairs? 0 0  Dressing or bathing? 0 0  Doing errands, shopping? 0 0  Preparing Food and eating ? N N  Using the Toilet? N N  In the past six months, have you accidently leaked urine? N N  Do you have problems with loss of bowel control? N N  Managing your Medications? N N  Managing your Finances? N N  Housekeeping or managing your Housekeeping? N N    Patient Care Team: Shade Flood, MD as PCP - General (Family Medicine) Rollene Rotunda, MD as PCP - Cardiology (Cardiology) Janalyn Harder, MD (Inactive) as Consulting Physician (Dermatology)  Indicate any recent Medical Services you may have received from other than [facility] providers in the past year (date may be approximate).     Assessment:   This is a routine wellness examination for Jeffrey Perry.  Hearing/Vision screen Hearing Screening - Comments:: No trouble hearing Vision Screening -  Comments:: Up to date [facility]   Goals Addressed             This Visit's Progress    Patient Stated       Continue current lifestyle       Depression Screen    09/15/2023    9:49 AM 05/18/2023    9:24 AM 02/09/2023    2:12 PM 11/18/2022    2:35 PM 06/23/2022    1:59 PM 06/23/2022    1:57 PM 03/22/2022    1:20 PM  PHQ 2/9 Scores  PHQ - 2 Score 0 0 0 0 0 0 0  PHQ- 9 Score 0 0 2 1   1     Fall Risk    09/15/2023    9:35 AM 09/14/2023   11:53 AM 05/18/2023    9:23 AM 02/09/2023    2:12 PM 11/18/2022    2:35 PM  Fall Risk   Falls in the past year? 0 0 0 0 1  Number falls in past yr: 0  0 0 0  Injury with Fall? 0  0 0 0  Risk for fall due to :   No Fall Risks No Fall Risks History of fall(s)  Follow up Falls evaluation completed;Education provided;Falls prevention discussed  Falls evaluation completed Falls evaluation completed Falls evaluation completed    MEDICARE RISK AT HOME: Medicare Risk at Home Any stairs in or around the home?: Yes If so, are there any without handrails?: No Home free of loose throw rugs in walkways, pet beds, electrical cords, etc?: Yes Adequate lighting in your home to reduce risk of falls?: Yes Life alert?: No Use of a cane, walker or w/c?: No Grab bars in the bathroom?: No Shower chair or bench in shower?: Yes Elevated toilet seat or a handicapped toilet?: Yes  TIMED UP AND GO:  Was the test performed?  No    Cognitive Function:        09/20/2023    8:13 AM 09/15/2023    9:35 AM 02/07/2020    8:18 AM  6CIT Screen  What Year?  0 points 0 points 0 points  What month? 0 points 0 points 0 points  What time? 0 points 0 points 0 points  Count back from 20 0 points 0 points 0 points  Months in reverse 2 points 2 points 0 points  Repeat phrase 2 points 2 points 0 points  Total Score 4 points 4 points 0 points    Immunizations Immunization History  Administered Date(s) Administered   DTaP 03/22/2008   Gamma Globulin  06/08/1994   H1N1 10/03/2004   Hepatitis A 03/29/1996, 06/10/1998   Hepatitis B 03/22/2008, 10/07/2008   IPV 03/18/2003   Meningococcal Conjugate 03/22/2008   PFIZER(Purple Top)SARS-COV-2 Vaccination 01/20/2020, 02/12/2020, 07/30/2020   Pneumococcal Conjugate-13 05/02/2010, 01/31/2019   Pneumococcal Polysaccharide-23 03/04/2014   Pneumococcal-Unspecified 05/02/2010   Tdap 03/22/2008, 03/24/2018   Typhoid Parenteral 05/29/2002   Yellow Fever 03/22/2008   Zoster, Live 03/29/2010    TDAP status: Up to date  Flu Vaccine status: Due, Education has been provided regarding the importance of this vaccine. Advised may receive this vaccine at local pharmacy or Health Dept. Aware to provide a copy of the vaccination record if obtained from local pharmacy or Health Dept. Verbalized acceptance and understanding.  Pneumococcal vaccine status: Up to date  Covid-19 vaccine status: Information provided on how to obtain vaccines.   Qualifies for Shingles Vaccine? Yes   Zostavax completed Yes   Shingrix Completed?: No.    Education has been provided regarding the importance of this vaccine. Patient has been advised to call insurance company to determine out of pocket expense if they have not yet received this vaccine. Advised may also receive vaccine at local pharmacy or Health Dept. Verbalized acceptance and understanding.  Screening Tests Health Maintenance  Topic Date Due   COVID-19 Vaccine (4 - 2023-24 season) 10/01/2023 (Originally 07/31/2023)   Zoster Vaccines- Shingrix (1 of 2) 12/16/2023 (Originally 01/17/1965)   Medicare Annual Wellness (AWV)  09/14/2024   DTaP/Tdap/Td (4 - Td or Tdap) 03/24/2028   Colonoscopy  03/30/2033   Pneumonia Vaccine 70+ Years old  Completed   Hepatitis C Screening  Completed   HPV VACCINES  Aged Out   INFLUENZA VACCINE  Discontinued    Health Maintenance  There are no preventive care reminders to display for this patient.   Colorectal cancer screening: No  longer required.   Lung Cancer Screening: (Low Dose CT Chest recommended if Age 62-80 years, 20 pack-year currently smoking OR have quit w/in 15years.) does not qualify.   Lung Cancer Screening Referral:   Additional Screening:  Hepatitis C Screening: does not qualify; Completed 2024  Vision Screening: Recommended annual ophthalmology exams for early detection of glaucoma and other disorders of the eye. Is the patient up to date with their annual eye exam?  Yes  Who is the provider or what is the name of the office in which the patient attends annual eye exams? [facility] If pt is not established with a provider, would they like to be referred to a provider to establish care? No .   Dental Screening: Recommended annual dental exams for proper oral hygiene   Community Resource Referral / Chronic Care Management: CRR required this visit?  No   CCM required this visit?  No     Plan:     I have personally reviewed and noted the following in the patient's chart:   Medical and social history Use of alcohol, tobacco or illicit drugs  Current medications and supplements including opioid prescriptions. Patient is not currently  taking opioid prescriptions. Functional ability and status Nutritional status Physical activity Advanced directives List of other physicians Hospitalizations, surgeries, and ER visits in previous 12 months Vitals Screenings to include cognitive, depression, and falls Referrals and appointments  In addition, I have reviewed and discussed with patient certain preventive protocols, quality metrics, and best practice recommendations. A written personalized care plan for preventive services as well as general preventive health recommendations were provided to patient.     Remi Haggard, LPN   24/40/1027   After Visit Summary: (MyChart) Due to this being a telephonic visit, the after visit summary with patients personalized plan was offered to patient via  MyChart   Nurse Notes:    Patient would like results of MRI sent to his my chart   09-08-2023 MRI

## 2023-09-21 NOTE — Telephone Encounter (Signed)
This has been added to the [facility] Practice Concerns spreadsheet

## 2023-09-30 DIAGNOSIS — R809 Proteinuria, unspecified: Secondary | ICD-10-CM | POA: Diagnosis not present

## 2023-09-30 DIAGNOSIS — N183 Chronic kidney disease, stage 3 unspecified: Secondary | ICD-10-CM | POA: Diagnosis not present

## 2023-09-30 DIAGNOSIS — E559 Vitamin D deficiency, unspecified: Secondary | ICD-10-CM | POA: Diagnosis not present

## 2023-09-30 DIAGNOSIS — I129 Hypertensive chronic kidney disease with stage 1 through stage 4 chronic kidney disease, or unspecified chronic kidney disease: Secondary | ICD-10-CM | POA: Diagnosis not present

## 2023-09-30 DIAGNOSIS — N3040 Irradiation cystitis without hematuria: Secondary | ICD-10-CM | POA: Diagnosis not present

## 2023-10-02 LAB — LAB REPORT - SCANNED
Creatinine, POC: 138.9 mg/dL
EGFR: 51

## 2023-10-03 ENCOUNTER — Encounter (INDEPENDENT_AMBULATORY_CARE_PROVIDER_SITE_OTHER): Payer: Medicare PPO | Admitting: Ophthalmology

## 2023-10-03 DIAGNOSIS — I10 Essential (primary) hypertension: Secondary | ICD-10-CM

## 2023-10-03 DIAGNOSIS — H353122 Nonexudative age-related macular degeneration, left eye, intermediate dry stage: Secondary | ICD-10-CM | POA: Diagnosis not present

## 2023-10-03 DIAGNOSIS — H353211 Exudative age-related macular degeneration, right eye, with active choroidal neovascularization: Secondary | ICD-10-CM | POA: Diagnosis not present

## 2023-10-03 DIAGNOSIS — H35033 Hypertensive retinopathy, bilateral: Secondary | ICD-10-CM | POA: Diagnosis not present

## 2023-10-03 DIAGNOSIS — H43813 Vitreous degeneration, bilateral: Secondary | ICD-10-CM

## 2023-10-10 ENCOUNTER — Other Ambulatory Visit: Payer: Self-pay | Admitting: Family Medicine

## 2023-10-10 DIAGNOSIS — I10 Essential (primary) hypertension: Secondary | ICD-10-CM

## 2023-10-13 DIAGNOSIS — N401 Enlarged prostate with lower urinary tract symptoms: Secondary | ICD-10-CM | POA: Diagnosis not present

## 2023-10-13 DIAGNOSIS — Z8546 Personal history of malignant neoplasm of prostate: Secondary | ICD-10-CM | POA: Diagnosis not present

## 2023-10-13 DIAGNOSIS — N529 Male erectile dysfunction, unspecified: Secondary | ICD-10-CM | POA: Diagnosis not present

## 2023-10-13 DIAGNOSIS — N138 Other obstructive and reflux uropathy: Secondary | ICD-10-CM | POA: Diagnosis not present

## 2023-10-21 ENCOUNTER — Telehealth: Payer: Self-pay | Admitting: Cardiology

## 2023-10-21 DIAGNOSIS — R002 Palpitations: Secondary | ICD-10-CM

## 2023-10-21 NOTE — Telephone Encounter (Signed)
Patient's response in MyChart

## 2023-10-21 NOTE — Telephone Encounter (Signed)
Spoke with pt, he reports his watch is telling him that his heart rate is elevating. He is able to do an ECG by his watch but does not know how to send those to [place]. Paperwork placed in the mail to help the patient with this process. He reports it will only last about 2 min and then will stop. Patient voiced understanding that if the palpitations last for a longer amount of time, getting an ECG somewhere will help diagnosis rhythm. He reports dr hochrein told him if it kept happening, he was going to give him some thing to put on this tongue to help. Aware will forward message to dr hochrein to review.

## 2023-10-24 NOTE — Telephone Encounter (Signed)
Spoke with pt, he reports the monitor was never delivered to his home. Aware will order monitor and Patient voiced understanding to let us know if he does not receive.

## 2023-10-25 ENCOUNTER — Encounter: Payer: Self-pay | Admitting: Cardiology

## 2023-10-25 NOTE — Progress Notes (Unsigned)
Cardiology Office Note:   Date:  10/26/2023  ID:  Jeffrey Perry, DOB 23-Mar-1946, MRN [id number] PCP: Shade Flood, MD  [facility] Providers Cardiologist:  Rollene Rotunda, MD {  History of Present Illness:   Jeffrey Perry is a 77 y.o. male who presents for evaluation of HTN and a heart murmur.   His last echo in May demonstrated an EF of mild to moderate TR he had mild dilatation of his aorta.  There was mild concentric left ventricular hypertrophy.    He called the other day with palpitations.  He was able to record an EKG on his wearable and that demonstrated atrial fibrillation.  He said has been getting these more frequently.  This seemed to happen more at night.  He could not sleep because his heart was skipping.  Over these last couple of days it seems to be coming and going.  Today he is in sinus rhythm with atrial bigeminy.  He is not having any new presyncope or syncope.  He does have some night sweats that he wonders if they are associated with his palpitations.  He is not having any new chest pressure, neck or arm discomfort.  He is not having any new shortness of breath, PND or orthopnea.  ROS: As stated in the HPI and negative for all other systems.  Studies Reviewed:    EKG:   EKG Interpretation Date/Time:  Wednesday October 26 2023 10:17:01 EST Ventricular Rate:  71 PR Interval:  170 QRS Duration:  106 QT Interval:  390 QTC Calculation: 423 R Axis:   -55  Text Interpretation: Sinus rhythm with frequent Premature atrial complexes in a pattern of bigeminy Left anterior fascicular block Moderate voltage criteria for LVH, may be normal variant ( R in aVL , Cornell product ) Nonspecific T wave abnormality When compared with ECG of 30-Apr-2016 21:14, Premature atrial complexes are now Present Nonspecific T wave abnormality has replaced inverted T waves in Inferior leads Nonspecific T wave abnormality now evident in Lateral leads Confirmed by Rollene Rotunda ([id number]) on  10/26/2023 10:27:45 AM     Risk Assessment/Calculations:    CHA2DS2-VASc Score = 3   This indicates a 3.2% annual risk of stroke. The patient's score is based upon: CHF History: 0 HTN History: 1 Diabetes History: 0 Stroke History: 0 Vascular Disease History: 0 Age Score: 2 Gender Score: 0    Physical Exam:   VS:  BP 132/72 (BP Location: Left Arm, Patient Position: Sitting, Cuff Size: Normal)   Pulse 71   Ht 6\' 1"  (1.854 m)   Wt 244 lb 6.4 oz (110.9 kg)   SpO2 99%   BMI 32.24 kg/m    Wt Readings from Last 3 Encounters:  10/26/23 244 lb 6.4 oz (110.9 kg)  05/31/23 237 lb 6.4 oz (107.7 kg)  05/18/23 237 lb (107.5 kg)     GEN: Well nourished, well developed in no acute distress NECK: No JVD; No carotid bruits CARDIAC: RRR, no murmurs, rubs, gallops RESPIRATORY:  Clear to auscultation without rales, wheezing or rhonchi  ABDOMEN: Soft, non-tender, non-distended EXTREMITIES:  No edema; No deformity   ASSESSMENT AND PLAN:   TR: This is moderate and I will follow this clinically.  Atrial fib: This is a new diagnosis.  Seems to be slightly symptomatic with this but I am going to give him Cardizem as needed if he has palpitations.  I can quantify the duration and frequency by having him wear a 2-week monitor.  He is  up-to-date with labs.  Has had his echocardiogram.  I am going to start him on Xarelto 20 mg daily and we talked about the risk benefit.  Aortic enlargement: I will follow this with repeat CTs in the future.  This was 43 mm on echo.  LVH: I will follow this up with echocardiography.  This is not only mild.     Follow up with [facility] in 3 months.   Signed, Rollene Rotunda, MD

## 2023-10-26 ENCOUNTER — Encounter: Payer: Self-pay | Admitting: Cardiology

## 2023-10-26 ENCOUNTER — Ambulatory Visit: Payer: Medicare PPO | Attending: Cardiology

## 2023-10-26 ENCOUNTER — Ambulatory Visit: Payer: Medicare PPO | Attending: Cardiology | Admitting: Cardiology

## 2023-10-26 VITALS — BP 132/72 | HR 71 | Ht 73.0 in | Wt 244.4 lb

## 2023-10-26 DIAGNOSIS — Z79899 Other long term (current) drug therapy: Secondary | ICD-10-CM

## 2023-10-26 DIAGNOSIS — I071 Rheumatic tricuspid insufficiency: Secondary | ICD-10-CM | POA: Diagnosis not present

## 2023-10-26 DIAGNOSIS — I4891 Unspecified atrial fibrillation: Secondary | ICD-10-CM | POA: Diagnosis not present

## 2023-10-26 MED ORDER — RIVAROXABAN 20 MG PO TABS: 20.0000 mg | ORAL_TABLET | Freq: Every day | ORAL | 3 refills | Status: AC

## 2023-10-26 MED ORDER — DILTIAZEM HCL 30 MG PO TABS: 30.0000 mg | ORAL_TABLET | Freq: Three times a day (TID) | ORAL | 11 refills | Status: AC | PRN

## 2023-10-26 NOTE — Progress Notes (Unsigned)
Enrolled patient for a 14 day Zio XT  monitor to be mailed to patients home  °

## 2023-10-26 NOTE — Patient Instructions (Signed)
Medication Instructions:  Start rivaroxaban 20 mg daily. Start Diltiazem 30 mg every 8 hours as needed.  *If you need a refill on your cardiac medications before your next appointment, please call your pharmacy*   Lab Work: CBC in one month. If you have labs (blood work) drawn today and your tests are completely normal, you will receive your results only by: MyChart Message (if you have MyChart) OR A paper copy in the mail If you have any lab test that is abnormal or we need to change your treatment, we will call you to review the results.   Testing/Procedures: Jeffrey Perry- Long Term Monitor Instructions  Your physician has requested you wear a ZIO patch monitor for 14 days.  This is a single patch monitor. [employer] supplies one patch monitor per enrollment. Additional stickers are not available. Please do not apply patch if you will be having a Nuclear Stress Test,  Echocardiogram, Cardiac CT, MRI, or Chest Xray during the period you would be wearing the  monitor. The patch cannot be worn during these tests. You cannot remove and re-apply the  ZIO XT patch monitor.  Your ZIO patch monitor will be mailed 3 day USPS to your address on file. It may take 3-5 days  to receive your monitor after you have been enrolled.  Once you have received your monitor, please review the enclosed instructions. Your monitor  has already been registered assigning a specific monitor serial # to you.  Billing and Patient Assistance Program Information  We have supplied [employer] with any of your insurance information on file for billing purposes. [employer] offers a sliding scale Patient Assistance Program for patients that do not have  insurance, or whose insurance does not completely cover the cost of the ZIO monitor.  You must apply for the Patient Assistance Program to qualify for this discounted rate.  To apply, please call [employer] at [phone], select option 4, select option 2, ask to apply for  Patient  Assistance Program. Jeffrey Perry will ask your household income, and how many people  are in your household. They will quote your out-of-pocket cost based on that information.  [employer] will also be able to set up a 70-month, interest-free payment plan if needed.  Applying the monitor   Shave hair from upper left chest.  Hold abrader disc by orange tab. Rub abrader in 40 strokes over the upper left chest as  indicated in your monitor instructions.  Clean area with 4 enclosed alcohol pads. Let dry.  Apply patch as indicated in monitor instructions. Patch will be placed under collarbone on left  side of chest with arrow pointing upward.  Rub patch adhesive wings for 2 minutes. Remove white label marked "1". Remove the white  label marked "2". Rub patch adhesive wings for 2 additional minutes.  While looking in a mirror, press and release button in center of patch. A small green light will  flash 3-4 times. This will be your only indicator that the monitor has been turned on.  Do not shower for the first 24 hours. You may shower after the first 24 hours.  Press the button if you feel a symptom. You will hear a small click. Record Date, Time and  Symptom in the Patient Logbook.  When you are ready to remove the patch, follow instructions on the last 2 pages of Patient  Logbook. Stick patch monitor onto the last page of Patient Logbook.  Place Patient Logbook in the blue and white box. Use  locking tab on box and tape box closed  securely. The blue and white box has prepaid postage on it. Please place it in the mailbox as  soon as possible. Your physician should have your test results approximately 7 days after the  monitor has been mailed back to [facility].  Call [facility] Customer Care at [phone] if you have questions regarding  your ZIO XT patch monitor. Call them immediately if you see an orange light blinking on your  monitor.  If your monitor falls off in less than 4 days,  contact our Monitor department at [phone].  If your monitor becomes loose or falls off after 4 days call [employer] at [phone] for  suggestions on securing your monitor    Follow-Up: At [facility], you and your health needs are our priority.  As part of our continuing mission to provide you with exceptional heart care, we have created designated Provider Care Teams.  These Care Teams include your primary Cardiologist (physician) and Advanced Practice Providers (APPs -  Physician Assistants and Nurse Practitioners) who all work together to provide you with the care you need, when you need it.  We recommend signing up for the patient portal called "MyChart".  Sign up information is provided on this After Visit Summary.  MyChart is used to connect with patients for Virtual Visits (Telemedicine).  Patients are able to view lab/test results, encounter notes, upcoming appointments, etc.  Non-urgent messages can be sent to your provider as well.   To learn more about what you can do with MyChart, go to [email or web address].    Your next appointment:    As needed after A fib clinic.  Provider:   Rollene Rotunda, MD

## 2023-10-28 DIAGNOSIS — R002 Palpitations: Secondary | ICD-10-CM

## 2023-10-31 ENCOUNTER — Encounter (INDEPENDENT_AMBULATORY_CARE_PROVIDER_SITE_OTHER): Payer: Medicare PPO | Admitting: Ophthalmology

## 2023-10-31 DIAGNOSIS — I4891 Unspecified atrial fibrillation: Secondary | ICD-10-CM

## 2023-10-31 DIAGNOSIS — I10 Essential (primary) hypertension: Secondary | ICD-10-CM

## 2023-10-31 DIAGNOSIS — H43813 Vitreous degeneration, bilateral: Secondary | ICD-10-CM | POA: Diagnosis not present

## 2023-10-31 DIAGNOSIS — H353211 Exudative age-related macular degeneration, right eye, with active choroidal neovascularization: Secondary | ICD-10-CM

## 2023-10-31 DIAGNOSIS — H353122 Nonexudative age-related macular degeneration, left eye, intermediate dry stage: Secondary | ICD-10-CM | POA: Diagnosis not present

## 2023-10-31 DIAGNOSIS — H35033 Hypertensive retinopathy, bilateral: Secondary | ICD-10-CM | POA: Diagnosis not present

## 2023-11-03 ENCOUNTER — Ambulatory Visit (HOSPITAL_COMMUNITY): Payer: Medicare PPO | Admitting: Internal Medicine

## 2023-11-07 ENCOUNTER — Telehealth: Payer: Self-pay | Admitting: *Deleted

## 2023-11-07 NOTE — Telephone Encounter (Signed)
   Cardiac Monitor Alert  Date of alert:  11/07/2023   Patient Name: Jeffrey Perry  DOB: [birth year]  MRN: [id number]   [facility] Cardiologist: Rollene Rotunda, MD  [facility] EP:  None    Monitor Information: Long Term Monitor [ZioXT]  Reason:  atrial fib Ordering provider:  hochrein   Alert Atrial Fibrillation/Flutter This is the 1st alert for this rhythm.  The patient has a hx of Atrial Fibrillation/Flutter.    Anticoagulation medication as of 11/07/2023           rivaroxaban (XARELTO) 20 MG TABS tablet Take 1 tablet (20 mg total) by mouth daily with supper.       Next Cardiology Appointment   Date:  due 02/2024  Provider:  hochrein  The patient was contacted today.  He is symptomatic.  He reports the following symptoms:  "I just did not feel like myself". Discussed with Dr Antoine Poche, No change in medications at this time.   Other: He also reports times when his heart rate is in the 30's. After resting, he will feel fine.  Deliah Goody, RN  11/07/2023 9:17 AM

## 2023-11-15 DIAGNOSIS — C61 Malignant neoplasm of prostate: Secondary | ICD-10-CM | POA: Diagnosis not present

## 2023-11-15 DIAGNOSIS — Z8546 Personal history of malignant neoplasm of prostate: Secondary | ICD-10-CM | POA: Diagnosis not present

## 2023-11-18 ENCOUNTER — Other Ambulatory Visit: Payer: Self-pay

## 2023-11-18 DIAGNOSIS — Z79899 Other long term (current) drug therapy: Secondary | ICD-10-CM | POA: Diagnosis not present

## 2023-11-18 DIAGNOSIS — I4891 Unspecified atrial fibrillation: Secondary | ICD-10-CM | POA: Diagnosis not present

## 2023-11-18 DIAGNOSIS — I10 Essential (primary) hypertension: Secondary | ICD-10-CM

## 2023-11-18 DIAGNOSIS — E876 Hypokalemia: Secondary | ICD-10-CM

## 2023-11-18 MED ORDER — POTASSIUM CHLORIDE CRYS ER 10 MEQ PO TBCR: 10.0000 meq | EXTENDED_RELEASE_TABLET | Freq: Two times a day (BID) | ORAL | 1 refills | Status: DC

## 2023-11-18 MED ORDER — LOSARTAN POTASSIUM-HCTZ 100-25 MG PO TABS: 1.0000 | ORAL_TABLET | Freq: Every day | ORAL | 1 refills | Status: DC

## 2023-11-19 LAB — CBC
Hematocrit: 40.8 % (ref 37.5–51.0)
Hemoglobin: 12.7 g/dL — ABNORMAL LOW (ref 13.0–17.7)
MCH: 22.5 pg — ABNORMAL LOW (ref 26.6–33.0)
MCHC: 31.1 g/dL — ABNORMAL LOW (ref 31.5–35.7)
MCV: 72 fL — ABNORMAL LOW (ref 79–97)
Platelets: 199 10*3/uL (ref 150–450)
RBC: 5.65 x10E6/uL (ref 4.14–5.80)
RDW: 15.2 % (ref 11.6–15.4)
WBC: 4.6 10*3/uL (ref 3.4–10.8)

## 2023-11-23 DIAGNOSIS — M889 Osteitis deformans of unspecified bone: Secondary | ICD-10-CM | POA: Insufficient documentation

## 2023-11-23 NOTE — Progress Notes (Signed)
 Office Visit Note  [name]             Date of Birth: [birth year]           MRN: [id number]             PCP: Levora Reyes SAUNDERS, MD Referring: Levora Reyes SAUNDERS, MD Visit Date: 12/06/2023 Occupation: @GUAROCC @  Subjective:  Medication management  History of Present Illness: Jeffrey Perry is a 77 y.o. male with Paget's disease of the pelvis.  He returns today after his last visit on May 31, 2023.  He denies any pelvic pain or hip pain.  He still continues to do well after the left thumb trigger finger release.  He states recently he had irregular heartbeat and he has been followed by cardiology.  His renal function was elevated on recent labs in December.  He states he was having some diarrhea at the time.  He has an appointment coming up with the nephrologist in couple of months.    Activities of Daily Living:  Patient reports morning stiffness for 0 minute.   Patient Denies nocturnal pain.  Difficulty dressing/grooming: Denies Difficulty climbing stairs: Denies Difficulty getting out of chair: Denies Difficulty using hands for taps, buttons, cutlery, and/or writing: Denies  Review of Systems  Constitutional:  Negative for fatigue.  HENT:  Negative for mouth sores and mouth dryness.   Eyes:  Positive for dryness.  Respiratory:  Negative for shortness of breath.   Cardiovascular:  Negative for chest pain and palpitations.  Gastrointestinal:  Negative for blood in stool, constipation and diarrhea.  Endocrine: Negative for increased urination.  Genitourinary:  Negative for involuntary urination.  Musculoskeletal:  Negative for joint pain, gait problem, joint pain, joint swelling, myalgias, muscle weakness, morning stiffness, muscle tenderness and myalgias.  Skin:  Negative for color change, rash, hair loss and sensitivity to sunlight.  Allergic/Immunologic: Negative for susceptible to infections.  Neurological:  Negative for dizziness and headaches.  Hematological:   Negative for swollen glands.  Psychiatric/Behavioral:  Negative for depressed mood and sleep disturbance. The patient is not nervous/anxious.     PMFS History:  Patient Active Problem List   Diagnosis Date Noted   Paget disease of bone: Pelvis and sacrum. 11/23/2023   Aortic root enlargement (HCC) 03/24/2023   Tricuspid valve insufficiency 09/25/2020   Palpitations 09/25/2020   CKD (chronic kidney disease), stage II 09/25/2020   Murmur 07/18/2017   Elevated LFTs    Abdominal pain 04/30/2016   Renal insufficiency 03/05/2013   HTN (hypertension) 03/06/2012    Past Medical History:  Diagnosis Date   Cataract    SMALL ONE IN ONE EYE LEFT   Chronic kidney disease    STAGE 2   Heart murmur    Hx of adenomatous polyp of colon 03/31/2023   Hypertension    Paget disease of bone    Questionable diagnosis.    Prostate cancer (HCC)    Sickle cell trait (HCC)    Tricuspid regurgitation     Family History  Problem Relation Age of Onset   Hypertension Mother    Hypertension Sister    Stroke Sister    Hypertension Sister    Hypertension Brother    Hypertension Brother    Colon cancer Neg Hx    Colon polyps Neg Hx    Crohn's disease Neg Hx    Esophageal cancer Neg Hx    Rectal cancer Neg Hx    Stomach cancer Neg Hx  Ulcerative colitis Neg Hx    Past Surgical History:  Procedure Laterality Date   BLADDER SURGERY     CHOLECYSTECTOMY  02/02/2023   COLONOSCOPY     FINGER GANGLION CYST EXCISION  2011   3rd finger right hand   HYDROCELE EXCISION  2012   PENILE PROSTHESIS IMPLANT     TRIGGER FINGER RELEASE Left 08/2021   thumb   Social History   Social History Narrative   Not on file   Immunization History  Administered Date(s) Administered   DTaP 03/22/2008   Gamma Globulin 06/08/1994   H1N1 10/03/2004   Hepatitis A 03/29/1996, 06/10/1998   Hepatitis B 03/22/2008, 10/07/2008   IPV 03/18/2003   Meningococcal Conjugate 03/22/2008   PFIZER(Purple Top)SARS-COV-2  Vaccination 01/20/2020, 02/12/2020, 07/30/2020   Pneumococcal Conjugate-13 05/02/2010, 01/31/2019   Pneumococcal Polysaccharide-23 03/04/2014   Pneumococcal-Unspecified 05/02/2010   Tdap 03/22/2008, 03/24/2018   Typhoid Parenteral 05/29/2002   Yellow Fever 03/22/2008   Zoster, Live 03/29/2010     Objective: Vital Signs: BP 136/78 (BP Location: Left Arm, Patient Position: Sitting, Cuff Size: Normal)   Pulse (!) 58   Resp 14   Ht 6' 1 (1.854 m)   Wt 241 lb (109.3 kg)   BMI 31.80 kg/m    Physical Exam Vitals and nursing note reviewed.  Constitutional:      Appearance: He is well-developed.  HENT:     Head: Normocephalic and atraumatic.  Eyes:     Conjunctiva/sclera: Conjunctivae normal.     Pupils: Pupils are equal, round, and reactive to light.  Cardiovascular:     Rate and Rhythm: Normal rate and regular rhythm.     Heart sounds: Normal heart sounds.  Pulmonary:     Effort: Pulmonary effort is normal.     Breath sounds: Normal breath sounds.  Abdominal:     General: Bowel sounds are normal.     Palpations: Abdomen is soft.  Musculoskeletal:     Cervical back: Normal range of motion and neck supple.  Skin:    General: Skin is warm and dry.     Capillary Refill: Capillary refill takes less than 2 seconds.  Neurological:     Mental Status: He is alert and oriented to person, place, and time.  Psychiatric:        Behavior: Behavior normal.      Musculoskeletal Exam: Cervical, thoracic and lumbar spine were in good range of motion.  He had no difficulty reaching his toes.  Shoulders, elbows, wrists, MC PIPs and DIPs were and good range of motion.  Hip joints and knee joints in good range of motion with no warmth swelling or effusion.  He had no tenderness over SI joints.  CDAI Exam: CDAI Score: -- Patient Global: --; Provider Global: -- Swollen: --; Tender: -- Joint Exam 12/06/2023   No joint exam has been documented for this visit   There is currently no  information documented on the homunculus. Go to the Rheumatology activity and complete the homunculus joint exam.  Investigation: No additional findings.  Imaging: Cardiac event monitor Result Date: 12/01/2023 Predominant rhythm was normal sinus Paroxysmal atrial fibrillation noted. Rare supraventricular and ventricular ectopy.   LONG TERM MONITOR (3-14 DAYS) Result Date: 11/20/2023 Predominant rhythm is normal sinus.  PAF occurred 3% of the time.  The average ventricular rate was well controlled.    Recent Labs: Lab Results  Component Value Date   WBC 4.6 11/18/2023   HGB 12.7 (L) 11/18/2023   PLT 199 11/18/2023  NA 134 (L) 11/28/2023   K 4.1 11/28/2023   CL 99 11/28/2023   CO2 28 11/28/2023   GLUCOSE 92 11/28/2023   BUN 15 11/28/2023   CREATININE 1.44 (H) 11/28/2023   BILITOT 0.6 11/28/2023   ALKPHOS 74 05/18/2023   AST 20 11/28/2023   ALT 23 11/28/2023   PROT 7.0 11/28/2023   ALBUMIN 4.2 05/18/2023   CALCIUM 9.1 11/28/2023   GFRAA 60 06/04/2021    Speciality Comments: No specialty comments available.  Procedures:  No procedures performed Allergies: Latex, Lisinopril, Neuromuscular blocking agents, Oxycodone, Phenazopyridine hcl, Pyridium [phenazopyridine hcl], and Sulfonamide derivatives   Assessment / Plan:     Visit Diagnoses: Paget disease of bone: Pelvis and sacrum. - Patient received IV Reclast  5 mg on May 09, 2020.November 28, 2023 CMP creatinine 1.44, alkaline phosphatase 81, AST 20, ALT 23.  Alkaline phosphatase remained stable.  He denies any discomfort in the pelvis or sacrum.  No tenderness was noted on the examination.  Good range of motion of facet joints.  I advised patient to contact me if he develops any increased pain or discomfort.  Stage 3a chronic kidney disease (HCC) - Followed by Dr. Rayburn.  Creatinine was elevated at 1.44 on November 28, 2023.  Patient related to chronic diarrhea from cholecystectomy.  Essential hypertension-blood  pressure was normal at 136/78.  Prostate cancer (HCC)  Pedal edema-improved.  Status postcholecystectomy-March 2024.  He has chronic diarrhea.  Orders: No orders of the defined types were placed in this encounter.  No orders of the defined types were placed in this encounter.    Follow-Up Instructions: Return in about 6 months (around 06/04/2024) for Paget  disease.   Maya Nash, MD  Note - This record has been created using Animal nutritionist.  Chart creation errors have been sought, but may not always  have been located. Such creation errors do not reflect on  the standard of medical care.

## 2023-11-28 ENCOUNTER — Other Ambulatory Visit: Payer: Self-pay | Admitting: *Deleted

## 2023-11-28 DIAGNOSIS — M889 Osteitis deformans of unspecified bone: Secondary | ICD-10-CM | POA: Diagnosis not present

## 2023-11-29 LAB — COMPLETE METABOLIC PANEL WITH GFR
AG Ratio: 1.4 (calc) (ref 1.0–2.5)
ALT: 23 U/L (ref 9–46)
AST: 20 U/L (ref 10–35)
Albumin: 4.1 g/dL (ref 3.6–5.1)
Alkaline phosphatase (APISO): 81 U/L (ref 35–144)
BUN/Creatinine Ratio: 10 (calc) (ref 6–22)
BUN: 15 mg/dL (ref 7–25)
CO2: 28 mmol/L (ref 20–32)
Calcium: 9.1 mg/dL (ref 8.6–10.3)
Chloride: 99 mmol/L (ref 98–110)
Creat: 1.44 mg/dL — ABNORMAL HIGH (ref 0.70–1.28)
Globulin: 2.9 g/dL (ref 1.9–3.7)
Glucose, Bld: 92 mg/dL (ref 65–99)
Potassium: 4.1 mmol/L (ref 3.5–5.3)
Sodium: 134 mmol/L — ABNORMAL LOW (ref 135–146)
Total Bilirubin: 0.6 mg/dL (ref 0.2–1.2)
Total Protein: 7 g/dL (ref 6.1–8.1)
eGFR: 51 mL/min/{1.73_m2} — ABNORMAL LOW (ref >=60)

## 2023-12-01 ENCOUNTER — Ambulatory Visit: Payer: Medicare PPO | Attending: Cardiology

## 2023-12-01 DIAGNOSIS — R002 Palpitations: Secondary | ICD-10-CM

## 2023-12-05 ENCOUNTER — Encounter (INDEPENDENT_AMBULATORY_CARE_PROVIDER_SITE_OTHER): Payer: Medicare PPO | Admitting: Ophthalmology

## 2023-12-05 DIAGNOSIS — H353122 Nonexudative age-related macular degeneration, left eye, intermediate dry stage: Secondary | ICD-10-CM

## 2023-12-05 DIAGNOSIS — I10 Essential (primary) hypertension: Secondary | ICD-10-CM | POA: Diagnosis not present

## 2023-12-05 DIAGNOSIS — H43813 Vitreous degeneration, bilateral: Secondary | ICD-10-CM

## 2023-12-05 DIAGNOSIS — H353211 Exudative age-related macular degeneration, right eye, with active choroidal neovascularization: Secondary | ICD-10-CM | POA: Diagnosis not present

## 2023-12-05 DIAGNOSIS — H35033 Hypertensive retinopathy, bilateral: Secondary | ICD-10-CM

## 2023-12-05 DIAGNOSIS — H2513 Age-related nuclear cataract, bilateral: Secondary | ICD-10-CM

## 2023-12-06 ENCOUNTER — Ambulatory Visit: Payer: Medicare PPO | Attending: Rheumatology | Admitting: Rheumatology

## 2023-12-06 ENCOUNTER — Encounter: Payer: Self-pay | Admitting: Rheumatology

## 2023-12-06 VITALS — BP 136/78 | HR 58 | Resp 14 | Ht 73.0 in | Wt 241.0 lb

## 2023-12-06 DIAGNOSIS — N1831 Chronic kidney disease, stage 3a: Secondary | ICD-10-CM | POA: Diagnosis not present

## 2023-12-06 DIAGNOSIS — Z9049 Acquired absence of other specified parts of digestive tract: Secondary | ICD-10-CM | POA: Diagnosis not present

## 2023-12-06 DIAGNOSIS — M889 Osteitis deformans of unspecified bone: Secondary | ICD-10-CM

## 2023-12-06 DIAGNOSIS — R600 Localized edema: Secondary | ICD-10-CM | POA: Diagnosis not present

## 2023-12-06 DIAGNOSIS — C61 Malignant neoplasm of prostate: Secondary | ICD-10-CM

## 2023-12-06 DIAGNOSIS — I10 Essential (primary) hypertension: Secondary | ICD-10-CM

## 2023-12-09 ENCOUNTER — Encounter: Payer: Self-pay | Admitting: Cardiology

## 2023-12-09 DIAGNOSIS — I10 Essential (primary) hypertension: Secondary | ICD-10-CM

## 2023-12-12 NOTE — Telephone Encounter (Signed)
 Attempted to call patient, no answer left message requesting a call back.

## 2023-12-12 NOTE — Telephone Encounter (Signed)
 Pt returning call to nurse

## 2023-12-15 ENCOUNTER — Ambulatory Visit (HOSPITAL_COMMUNITY)
Admission: RE | Admit: 2023-12-15 | Discharge: 2023-12-15 | Disposition: A | Discharge status: Home / Self Care | Payer: Medicare PPO | Source: Ambulatory Visit | Attending: Internal Medicine | Admitting: Internal Medicine

## 2023-12-15 VITALS — BP 130/64 | HR 56 | Ht 73.0 in | Wt 241.0 lb

## 2023-12-15 DIAGNOSIS — I480 Paroxysmal atrial fibrillation: Secondary | ICD-10-CM | POA: Diagnosis not present

## 2023-12-15 DIAGNOSIS — R002 Palpitations: Secondary | ICD-10-CM | POA: Insufficient documentation

## 2023-12-15 DIAGNOSIS — Z79899 Other long term (current) drug therapy: Secondary | ICD-10-CM | POA: Diagnosis not present

## 2023-12-15 DIAGNOSIS — I10 Essential (primary) hypertension: Secondary | ICD-10-CM | POA: Diagnosis not present

## 2023-12-15 DIAGNOSIS — I4891 Unspecified atrial fibrillation: Secondary | ICD-10-CM

## 2023-12-15 DIAGNOSIS — D6869 Other thrombophilia: Secondary | ICD-10-CM | POA: Diagnosis not present

## 2023-12-15 DIAGNOSIS — Z7901 Long term (current) use of anticoagulants: Secondary | ICD-10-CM | POA: Diagnosis not present

## 2023-12-15 MED ORDER — DILTIAZEM HCL ER BEADS 120 MG PO CP24: 120.0000 mg | ORAL_CAPSULE | Freq: Every day | ORAL | 2 refills | Status: DC

## 2023-12-15 NOTE — Patient Instructions (Addendum)
Try the cardizem 120mg  daily -- monitor your heart rates and amount of afib you are having  Take the 30mg  of cardizem as needed for afib heart rates over 100

## 2023-12-15 NOTE — Progress Notes (Signed)
Primary Care Physician: Shade Flood, MD Primary Cardiologist: Rollene Rotunda, MD Electrophysiologist: None     Referring Physician: Dr. Jerold Coombe Ruoff is a 78 y.o. male with a history of HTN, mild dilatation of aorta, tricuspid valve insufficiency, CKD, and paroxysmal atrial fibrillation who presents for consultation in the [facility]. Cardiac monitor 11-10/2023 showed 3% PAF burden. Patient contacted office on 12/09/23 noting low HR at night and in AM making him feel weak and dizzy. Diltiazem dose lowered to 120 mg daily by Dr. Antoine Poche. Patient is on Xarelto 20 mg daily for a CHADS2VASC score of 3.  On evaluation today, he is currently in NSR. Patient tells me that he has been taking diltiazem 180 mg once daily and diltiazem 30 mg TID during the period noting the lower HR. He was not taking diltiazem 30 mg PRN but regularly TID. Review of watch data shows he has primarily HR 30-40 at bedtime but also in evening time while he is awake. He states sometimes it makes him dizzy and he has to lay his head back to feel improvement. He has not yet begun the diltiazem 120 mg daily sent by Dr. Antoine Poche. He has noted increased palpitations over the past few weeks.   Today, he denies symptoms of chest pain, shortness of breath, orthopnea, PND, lower extremity edema, dizziness, presyncope, syncope, snoring, daytime somnolence, bleeding, or neurologic sequela. The patient is tolerating medications without difficulties and is otherwise without complaint today.   he has a BMI of Body mass index is 31.8 kg/m.[name] Kitchen Filed Weights   12/15/23 1112  Weight: 109.3 kg    Current Outpatient Medications  Medication Sig Dispense Refill   alfuzosin (UROXATRAL) 10 MG 24 hr tablet TAKE 1 TABLET BY MOUTH EVERY MORNING WITH BREAKFAST 90 tablet 1   bevacizumab (AVASTIN) 2.5 mg/0.1 mL SOLN intravitreal injection 0.625 mg by Intravitreal route every 30 (thirty) days.      ciprofloxacin (CILOXAN) 0.3 % ophthalmic solution Insert 1 drop in the right eye 4 times daily x 2 days, this is after his Avastin injection     diltiazem (CARDIZEM) 30 MG tablet Take 1 tablet (30 mg total) by mouth every 8 (eight) hours as needed. 90 tablet 11   diltiazem (TIAZAC) 120 MG 24 hr capsule Take 1 capsule (120 mg total) by mouth at bedtime. 90 capsule 2   losartan-hydrochlorothiazide (HYZAAR) 100-25 MG tablet Take 1 tablet by mouth daily. 90 tablet 1   potassium chloride (KLOR-CON M) 10 MEQ tablet Take 1 tablet (10 mEq total) by mouth 2 (two) times daily. 180 tablet 1   rivaroxaban (XARELTO) 20 MG TABS tablet Take 1 tablet (20 mg total) by mouth daily with supper. 90 tablet 3   senna-docusate (SENOKOT-S) 8.6-50 MG tablet Take 1 tablet by mouth as needed for mild constipation.     Vitamin D, Ergocalciferol, 50000 units CAPS Take 50,000 Units by mouth every 30 (thirty) days.     No current facility-administered medications for this encounter.    Atrial Fibrillation Management history:  Previous antiarrhythmic drugs: none Previous cardioversions: none Previous ablations: none Anticoagulation history: Xarelto 20 mg daily.   ROS- All systems are reviewed and negative except as per the HPI above.  Physical Exam: BP 130/64   Pulse (!) 56   Ht 6\' 1"  (1.854 m)   Wt 109.3 kg   BMI 31.80 kg/m   GEN: Well nourished, well developed in no acute distress NECK: No  JVD; No carotid bruits CARDIAC: Regular rate and rhythm, no murmurs, rubs, gallops RESPIRATORY:  Clear to auscultation without rales, wheezing or rhonchi  ABDOMEN: Soft, non-tender, non-distended EXTREMITIES:  No edema; No deformity   EKG today demonstrates  Vent. rate 56 BPM PR interval 154 ms QRS duration 90 ms QT/QTcB 390/376 ms P-R-T axes 53 -63 -1 Sinus bradycardia Left anterior fascicular block Minimal voltage criteria for LVH, may be normal variant ( R in aVL ) Abnormal ECG When compared with ECG of  26-Oct-2023 10:17, PREVIOUS ECG IS PRESENT  Cardiac monitor 11-10/2023: Predominant rhythm is normal sinus.   PAF occurred 3% of the time.  The average ventricular rate was well controlled.   Echo 04/21/23 demonstrated   1. Left ventricular ejection fraction, by estimation, is 65 to 70%. The  left ventricle has normal function. The left ventricle has no regional  wall motion abnormalities. There is mild concentric left ventricular  hypertrophy. Left ventricular diastolic  parameters are consistent with Grade I diastolic dysfunction (impaired  relaxation).   2. Right ventricular systolic function is normal. The right ventricular  size is normal. There is mildly elevated pulmonary artery systolic  pressure.   3. The mitral valve is normal in structure. Trivial mitral valve  regurgitation.   4. Tricuspid valve regurgitation is mild to moderate.   5. The aortic valve is tricuspid. There is mild calcification of the  aortic valve. There is mild thickening of the aortic valve. Aortic valve  regurgitation is not visualized. Aortic valve sclerosis/calcification is  present, without any evidence of  aortic stenosis.   6. Aortic dilatation noted. There is mild dilatation of the ascending  aorta, measuring 43 mm.   7. The inferior vena cava is normal in size with greater than 50%  respiratory variability, suggesting right atrial pressure of 3 mmHg.    ASSESSMENT & PLAN CHA2DS2-VASc Score = 3  The patient's score is based upon: CHF History: 0 HTN History: 1 Diabetes History: 0 Stroke History: 0 Vascular Disease History: 0 Age Score: 2 Gender Score: 0       ASSESSMENT AND PLAN: Paroxysmal Atrial Fibrillation (ICD10:  I48.0) The patient's CHA2DS2-VASc score is 3, indicating a 3.2% annual risk of stroke.    He is in NSR. We discussed his previous rate control regimen of taking diltiazem 180 mg daily and diltiazem 30 mg TID regularly. He will make the transition to diltiazem 120 mg  sent by Dr. Antoine Poche and take this daily monitoring his rhythm and HR. He will transition to taking diltiazem 30 mg PRN palpitations and not TID. Given his description of increased Afib / palpitations despite taking diltiazem 30 mg TID, I went over options for rhythm control with patient in the future if indicated. For now, he will start taking diltiazem 120 mg daily and check his HR and rhythm regularly, only taking diltiazem 30 mg PRN. He will contact Dr. Jenene Slicker office in the next 1-2 weeks to provide update on effects of new dosage of diltiazem.  I did mention to patient if is having increased Afib burden then would likely recommend rhythm control options (AAD vs ablation) for better control. He states he would discuss with Dr. Antoine Poche before making a choice.   Secondary Hypercoagulable State (ICD10:  D68.69) The patient is at significant risk for stroke/thromboembolism based upon his CHA2DS2-VASc Score of 3.  Continue Rivaroxaban (Xarelto).      Follow up Afib clinic prn.    Lake Bells, PA-C  [facility]  [facility] [address] [place], [place] [address] [phone]

## 2023-12-17 DIAGNOSIS — I129 Hypertensive chronic kidney disease with stage 1 through stage 4 chronic kidney disease, or unspecified chronic kidney disease: Secondary | ICD-10-CM | POA: Diagnosis not present

## 2023-12-17 DIAGNOSIS — R7303 Prediabetes: Secondary | ICD-10-CM | POA: Diagnosis not present

## 2023-12-17 DIAGNOSIS — I480 Paroxysmal atrial fibrillation: Secondary | ICD-10-CM | POA: Diagnosis not present

## 2023-12-17 DIAGNOSIS — N183 Chronic kidney disease, stage 3 unspecified: Secondary | ICD-10-CM | POA: Diagnosis not present

## 2023-12-17 DIAGNOSIS — R0789 Other chest pain: Secondary | ICD-10-CM | POA: Diagnosis not present

## 2023-12-17 DIAGNOSIS — N400 Enlarged prostate without lower urinary tract symptoms: Secondary | ICD-10-CM | POA: Diagnosis not present

## 2023-12-17 DIAGNOSIS — I4891 Unspecified atrial fibrillation: Secondary | ICD-10-CM | POA: Diagnosis not present

## 2023-12-17 DIAGNOSIS — R001 Bradycardia, unspecified: Secondary | ICD-10-CM | POA: Diagnosis not present

## 2023-12-17 DIAGNOSIS — I1310 Hypertensive heart and chronic kidney disease without heart failure, with stage 1 through stage 4 chronic kidney disease, or unspecified chronic kidney disease: Secondary | ICD-10-CM | POA: Diagnosis not present

## 2023-12-17 DIAGNOSIS — D631 Anemia in chronic kidney disease: Secondary | ICD-10-CM | POA: Diagnosis not present

## 2023-12-17 DIAGNOSIS — Z8679 Personal history of other diseases of the circulatory system: Secondary | ICD-10-CM | POA: Diagnosis not present

## 2023-12-17 DIAGNOSIS — I452 Bifascicular block: Secondary | ICD-10-CM | POA: Diagnosis not present

## 2023-12-17 DIAGNOSIS — R079 Chest pain, unspecified: Secondary | ICD-10-CM | POA: Diagnosis not present

## 2023-12-17 DIAGNOSIS — Z8546 Personal history of malignant neoplasm of prostate: Secondary | ICD-10-CM | POA: Diagnosis not present

## 2023-12-18 DIAGNOSIS — R7303 Prediabetes: Secondary | ICD-10-CM | POA: Insufficient documentation

## 2023-12-18 DIAGNOSIS — I129 Hypertensive chronic kidney disease with stage 1 through stage 4 chronic kidney disease, or unspecified chronic kidney disease: Secondary | ICD-10-CM | POA: Diagnosis not present

## 2023-12-18 DIAGNOSIS — D631 Anemia in chronic kidney disease: Secondary | ICD-10-CM | POA: Diagnosis not present

## 2023-12-18 DIAGNOSIS — I480 Paroxysmal atrial fibrillation: Secondary | ICD-10-CM | POA: Diagnosis not present

## 2023-12-18 DIAGNOSIS — Z87898 Personal history of other specified conditions: Secondary | ICD-10-CM | POA: Insufficient documentation

## 2023-12-18 DIAGNOSIS — N400 Enlarged prostate without lower urinary tract symptoms: Secondary | ICD-10-CM | POA: Diagnosis not present

## 2023-12-18 DIAGNOSIS — N183 Chronic kidney disease, stage 3 unspecified: Secondary | ICD-10-CM | POA: Diagnosis not present

## 2023-12-18 DIAGNOSIS — I4891 Unspecified atrial fibrillation: Secondary | ICD-10-CM | POA: Diagnosis not present

## 2023-12-18 DIAGNOSIS — Z8679 Personal history of other diseases of the circulatory system: Secondary | ICD-10-CM | POA: Diagnosis not present

## 2023-12-23 ENCOUNTER — Ambulatory Visit: Payer: Medicare PPO | Admitting: Family Medicine

## 2023-12-23 VITALS — BP 130/74 | HR 62 | Temp 98.5°F | Ht 73.0 in | Wt 240.8 lb

## 2023-12-23 DIAGNOSIS — R001 Bradycardia, unspecified: Secondary | ICD-10-CM

## 2023-12-23 DIAGNOSIS — I10 Essential (primary) hypertension: Secondary | ICD-10-CM | POA: Diagnosis not present

## 2023-12-23 DIAGNOSIS — I480 Paroxysmal atrial fibrillation: Secondary | ICD-10-CM

## 2023-12-23 DIAGNOSIS — D509 Iron deficiency anemia, unspecified: Secondary | ICD-10-CM | POA: Diagnosis not present

## 2023-12-23 LAB — CBC
HCT: 40.6 % (ref 39.0–52.0)
Hemoglobin: 13.1 g/dL (ref 13.0–17.0)
MCHC: 32.2 g/dL (ref 30.0–36.0)
MCV: 70.8 fL — ABNORMAL LOW (ref 78.0–100.0)
Platelets: 168 10*3/uL (ref 150.0–400.0)
RBC: 5.73 Mil/uL (ref 4.22–5.81)
RDW: 14.8 % (ref 11.5–15.5)
WBC: 4.5 10*3/uL (ref 4.0–10.5)

## 2023-12-23 LAB — BASIC METABOLIC PANEL
BUN: 16 mg/dL (ref 6–23)
CO2: 29 meq/L (ref 19–32)
Calcium: 9.5 mg/dL (ref 8.4–10.5)
Chloride: 103 meq/L (ref 96–112)
Creatinine, Ser: 1.29 mg/dL (ref 0.40–1.50)
GFR: 54.08 mL/min — ABNORMAL LOW (ref >60.00)
Glucose, Bld: 87 mg/dL (ref 70–99)
Potassium: 3.9 meq/L (ref 3.5–5.1)
Sodium: 140 meq/L (ref 135–145)

## 2023-12-23 MED ORDER — AMLODIPINE BESYLATE 2.5 MG PO TABS: 2.5000 mg | ORAL_TABLET | Freq: Every day | ORAL | 1 refills | Status: DC

## 2023-12-23 NOTE — Patient Instructions (Addendum)
Keep follow up with Dr. Tami Ribas as planned next month for the heart rate concerns, but I would consider meeting with your local cardiologist sooner, especially if you have any persistent low heart rates.  I will send the chart and a possible message to your cardiologist locally.  Continue amlodipine 2.5 mg for now for blood pressure.  If you are having readings persistently above 130 on the top number or above 80 on the lower number, can take an extra dose and let me know.  If any concerns on labs I will let you know.  Follow-up with me in 3 months and we can recheck prediabetes test and labs at that time.  Take care!  Return to the clinic or go to the nearest emergency room if any of your symptoms worsen or new symptoms occur.

## 2023-12-23 NOTE — Progress Notes (Unsigned)
Subjective:  Patient ID: Jeffrey Perry, male    DOB: November 14, 1946  Age: 78 y.o. MRN: [id number]  CC:  Chief Complaint  Patient presents with   Hospitalization Follow-up    Pt notes doing okay, did forget recordings of BP this morning, yesterday was 127/75 usually will take BP 3 times then take an average, notes his watch did read his pulse as 33 last night at one point and wasn't sure if this should be further checked     HPI Jeffrey Perry presents for   Hospital follow-up.   Chart reviewed.  Was admitted to [facility] January 18 through January 19. Admitted with symptomatic bradycardia, and chest pain.  Changes made of stopping diltiazem 120 mg and switch to diltiazem 30 mg every 6 hours as needed for palpitations and heart rate above 100 and added Norvasc 2.5 mg daily with option to increase in increments of 2.5 mg for blood pressures above 130/80.  12-week follow-up A1c.  And follow-up of bradycardia and A-fib.  Referral was placed to electrophysiology -for information on possible pacemaker placement and ablation.  Initially evaluated for heart rate predominantly in the 60s with 30 episodes of bradycardia on his Apple Watch.  He was seen by A-fib specialist Thursday 1/15.  Fatigue with bradycardia but not short of breath.  Note indicated reassuring overnight CE and EKG.  Formal cardiology consult was placed and echo deemed not necessary.  Long-acting diltiazem was stopped and fractionated 30 mg dose as above, along with amlodipine 2.5 mg daily for blood pressure control in the interim with outpatient titration. Next cardiology appointment with Dr. Arvilla Meres at [facility] on 01/24/2024.  Only taking diltiazem if needed - only if above 100. Has not needed since hospital d/c. No recent fast heart rates or palpitations.  Low heart rates have continued. Low of 33 when asleep last night. No lows during day. PAF on long term monitor in December.  Normal TSH 2.83 on 12/17/23, magnesium 2.  Hgb 12.8 with MCV 71 on  1/18. No melena/hematochezia. No new fatigue, but some fatigue with low heart rate.  Creatinine 1.4 on 1/18. 1.2 on 1/19.  No chest pains.  Home BP 127/75 yesterday  Still on 2.5mg  amlodipine dose.  No new bleeding with xarelto.   Lab Results  Component Value Date   WBC 4.6 11/18/2023   HGB 12.7 (L) 11/18/2023   HCT 40.8 11/18/2023   MCV 72 (L) 11/18/2023   PLT 199 11/18/2023   Prediabetes: A1c 5.8 on recent hospitalization.  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 11/23/2023   Aortic root enlargement (HCC) 03/24/2023   Tricuspid valve insufficiency 09/25/2020   Palpitations 09/25/2020   CKD (chronic kidney disease), stage II 09/25/2020   Murmur 07/18/2017   Elevated LFTs    Abdominal pain 04/30/2016   Renal insufficiency 03/05/2013   HTN (hypertension) 03/06/2012   Past Medical History:  Diagnosis Date   Cataract    "SMALL ONE IN ONE EYE LEFT"   Chronic kidney disease    STAGE 2   Heart murmur  Hx of adenomatous polyp of colon 03/31/2023   Hypertension    Paget disease of bone    Questionable diagnosis.    Prostate cancer (HCC)    Sickle cell trait (HCC)    Tricuspid regurgitation    Past Surgical History:  Procedure Laterality Date   BLADDER SURGERY     CHOLECYSTECTOMY  02/02/2023   COLONOSCOPY     FINGER GANGLION CYST EXCISION  2011   3rd finger right hand   HYDROCELE EXCISION  2012   PENILE PROSTHESIS IMPLANT     TRIGGER FINGER RELEASE Left 08/2021   thumb   Allergies  Allergen Reactions   Latex Itching   Lisinopril Cough   Neuromuscular Blocking Agents  Swelling    Ankle swelling Ankle swelling   Oxycodone Itching   Phenazopyridine Hcl Swelling    Ankle swelling   Pyridium [Phenazopyridine Hcl] Swelling    Ankle swelling   Sulfonamide Derivatives Itching   Prior to Admission medications   Medication Sig Start Date End Date Taking? Authorizing Provider  alfuzosin (UROXATRAL) 10 MG 24 hr tablet TAKE 1 TABLET BY MOUTH EVERY MORNING WITH BREAKFAST 10/10/23  Yes Shade Flood, MD  bevacizumab (AVASTIN) 2.5 mg/0.1 mL SOLN intravitreal injection 0.625 mg by Intravitreal route every 30 (thirty) days.   Yes [provider]  ciprofloxacin (CILOXAN) 0.3 % ophthalmic solution Insert 1 drop in the right eye 4 times daily x 2 days, this is after his Avastin injection 10/09/22  Yes [provider]  diltiazem (CARDIZEM) 30 MG tablet Take 1 tablet (30 mg total) by mouth every 8 (eight) hours as needed. 10/26/23  Yes Rollene Rotunda, MD  diltiazem (TIAZAC) 120 MG 24 hr capsule Take 1 capsule (120 mg total) by mouth at bedtime. 12/15/23  Yes Rollene Rotunda, MD  losartan-hydrochlorothiazide (HYZAAR) 100-25 MG tablet Take 1 tablet by mouth daily. 11/18/23  Yes Shade Flood, MD  potassium chloride (KLOR-CON M) 10 MEQ tablet Take 1 tablet (10 mEq total) by mouth 2 (two) times daily. 11/18/23  Yes Shade Flood, MD  rivaroxaban (XARELTO) 20 MG TABS tablet Take 1 tablet (20 mg total) by mouth daily with supper. 10/26/23  Yes Rollene Rotunda, MD  senna-docusate (SENOKOT-S) 8.6-50 MG tablet Take 1 tablet by mouth as needed for mild constipation. 02/03/23  Yes [provider]  Vitamin D, Ergocalciferol, 50000 units CAPS Take 50,000 Units by mouth every 30 (thirty) days.   Yes [provider]   Social History   Socioeconomic History   Marital status: Married    Spouse name: Not on file   Number of children: Not on file   Years of education: Not on file   Highest education level: Doctorate  Occupational History    Occupation: professor    Employer: [employer]  Tobacco Use   Smoking status: Never    Passive exposure: Never   Smokeless tobacco: Never  Vaping Use   Vaping status: Never Used  Substance and Sexual Activity   Alcohol use: Yes    Comment: Beer occ   Drug use: No   Sexual activity: Yes    Partners: Female  Other Topics Concern   Not on file  Social History Narrative   Not on file   Social Drivers of Health   Financial Resource Strain: Low Risk  (12/18/2023)   Received from [facility]   Overall Financial Resource Strain (CARDIA)    Difficulty of Paying Living Expenses: Not hard at all  Food Insecurity:  No Food Insecurity (12/18/2023)   Received from [facility]   Hunger Vital Sign    Worried About Running Out of Food in the Last Year: Never true    Ran Out of Food in the Last Year: Never true  Transportation Needs: Unmet Transportation Needs (12/18/2023)   Received from [facility] - Transportation    In the past 12 months, has lack of transportation kept you from medical appointments or from getting medications?: Yes    Lack of Transportation (Non-Medical): No  Physical Activity: Sufficiently Active (09/15/2023)   Exercise Vital Sign    Days of Exercise per Week: 3 days    Minutes of Exercise per Session: 60 min  Stress: No Stress Concern Present (09/15/2023)   [employer] of Occupational Health - Occupational Stress Questionnaire    Feeling of Stress : Not at all  Social Connections: Socially Integrated (09/15/2023)   Social Connection and Isolation Panel [NHANES]    Frequency of Communication with Friends and Family: More than three times a week    Frequency of Social Gatherings with Friends and Family: More than three times a week    Attends Religious Services: 1 to 4 times per year    Active Member of [employer] or Organizations: Yes    Attends Banker Meetings: Never    Marital Status: Married   Catering manager Violence: Not At Risk (09/15/2023)   Humiliation, Afraid, Rape, and Kick questionnaire    Fear of Current or Ex-Partner: No    Emotionally Abused: No    Physically Abused: No    Sexually Abused: No    Review of Systems  Per HPI Objective:   Vitals:   12/23/23 1026  BP: 130/74  Pulse: 62  Temp: 98.5 F (36.9 C)  TempSrc: Temporal  SpO2: 100%  Weight: 240 lb 12.8 oz (109.2 kg)  Height: 6\' 1"  (1.854 m)     Physical Exam Vitals reviewed.  Constitutional:      Appearance: He is well-developed.  HENT:     Head: Normocephalic and atraumatic.  Neck:     Vascular: No carotid bruit or JVD.  Cardiovascular:     Rate and Rhythm: Normal rate and regular rhythm.     Heart sounds: Normal heart sounds. No murmur heard. Pulmonary:     Effort: Pulmonary effort is normal.     Breath sounds: Normal breath sounds. No rales.  Musculoskeletal:     Right lower leg: No edema.     Left lower leg: No edema.  Skin:    General: Skin is warm and dry.  Neurological:     Mental Status: He is alert and oriented to person, place, and time.  Psychiatric:        Mood and Affect: Mood normal.        Assessment & Plan:  Samier Jaco is a 78 y.o. male . Symptomatic bradycardia - Plan: Basic metabolic panel Paroxysmal atrial fibrillation (HCC)  -Stable since hospital discharge, episode of bradycardia captured overnight but asymptomatic currently.  Off diltiazem, blood pressure stable on amlodipine alone.  Has short acting diltiazem if needed for breakthrough A-fib/tachycardia.  Question tacky/bradycardia syndrome, cardiology follow-up planned next month.  Did recommend he follow-up with his local cardiologist prior to that time if persistent bradycardia symptoms.  Will forward a copy to Dr. Antoine Poche of note from today.  RTC/ER precautions.  Essential hypertension - Plan: Basic metabolic panel, amLODipine (NORVASC) 2.5 MG tablet  -As  above, stable on 2.5 mg amlodipine with  option to add additional dosing for elevated readings.  Microcytic anemia - Plan: CBC, Iron, TIBC and Ferritin Panel  -Check updated labs including iron panel.  Borderline low previously, question hemoglobinopathy/thalassemia  Meds ordered this encounter  Medications   amLODipine (NORVASC) 2.5 MG tablet    Sig: Take 1 tablet (2.5 mg total) by mouth daily.    Dispense:  90 tablet    Refill:  1   Patient Instructions  Keep follow up with Dr. Tami Ribas as planned next month for the heart rate concerns, but I would consider meeting with your local cardiologist sooner, especially if you have any persistent low heart rates.  I will send the chart and a possible message to your cardiologist locally.  Continue amlodipine 2.5 mg for now for blood pressure.  If you are having readings persistently above 130 on the top number or above 80 on the lower number, can take an extra dose and let me know.  If any concerns on labs I will let you know.  Follow-up with me in 3 months and we can recheck prediabetes test and labs at that time.  Take care!  Return to the clinic or go to the nearest emergency room if any of your symptoms worsen or new symptoms occur.     Signed,   Meredith Staggers, MD  Primary Care, [facility] 12/23/23 10:54 AM

## 2023-12-24 LAB — IRON,TIBC AND FERRITIN PANEL
%SAT: 30 % (ref 20–48)
Ferritin: 181 ng/mL (ref 24–380)
Iron: 104 ug/dL (ref 50–180)
TIBC: 345 ug/dL (ref 250–425)

## 2023-12-25 ENCOUNTER — Encounter: Payer: Self-pay | Admitting: Family Medicine

## 2023-12-28 ENCOUNTER — Encounter: Payer: Self-pay | Admitting: Family Medicine

## 2024-01-09 ENCOUNTER — Encounter (INDEPENDENT_AMBULATORY_CARE_PROVIDER_SITE_OTHER): Payer: Medicare PPO | Admitting: Ophthalmology

## 2024-01-09 DIAGNOSIS — H43813 Vitreous degeneration, bilateral: Secondary | ICD-10-CM

## 2024-01-09 DIAGNOSIS — I10 Essential (primary) hypertension: Secondary | ICD-10-CM | POA: Diagnosis not present

## 2024-01-09 DIAGNOSIS — H353122 Nonexudative age-related macular degeneration, left eye, intermediate dry stage: Secondary | ICD-10-CM

## 2024-01-09 DIAGNOSIS — H353211 Exudative age-related macular degeneration, right eye, with active choroidal neovascularization: Secondary | ICD-10-CM | POA: Diagnosis not present

## 2024-01-09 DIAGNOSIS — H35033 Hypertensive retinopathy, bilateral: Secondary | ICD-10-CM

## 2024-01-23 ENCOUNTER — Other Ambulatory Visit: Payer: Self-pay | Admitting: Family Medicine

## 2024-01-23 DIAGNOSIS — I10 Essential (primary) hypertension: Secondary | ICD-10-CM

## 2024-01-23 NOTE — Telephone Encounter (Signed)
 Copied from CRM [id number]. Topic: Clinical - Medication Refill >> Jan 23, 2024 11:54 AM Elizebeth Brooking wrote: Most Recent Primary Care Visit:  Provider: Meredith Staggers R  Department: [facility]  Visit Type: OFFICE VISIT  Date: 12/23/2023  Medication: amLODipine (NORVASC) 2.5 MG tablet -- Would like a 90 day supply   Has the patient contacted their pharmacy? Yes (Agent: If no, request that the patient contact the pharmacy for the refill. If patient does not wish to contact the pharmacy document the reason why and proceed with request.) (Agent: If yes, when and what did the pharmacy advise?)  Is this the correct pharmacy for this prescription? Yes If no, delete pharmacy and type the correct one.  This is the patient's preferred pharmacy:  [facility] [id number] - [place], [place] - [address] [place] [address] Phone: [phone] Fax: [phone]    Has the prescription been filled recently? No  Is the patient out of the medication? Yes  Has the patient been seen for an appointment in the last year OR does the patient have an upcoming appointment? Yes  Can we respond through MyChart? Yes  Agent: Please be advised that Rx refills may take up to 3 business days. We ask that you follow-up with your pharmacy.

## 2024-01-23 NOTE — Telephone Encounter (Signed)
 Last Fill: 12/23/23-pt requesting 90 day supply  Last OV: 12/23/23 Next OV: 03/22/24  Routing to provider for review/authorization.

## 2024-01-24 DIAGNOSIS — I480 Paroxysmal atrial fibrillation: Secondary | ICD-10-CM | POA: Diagnosis not present

## 2024-01-27 DIAGNOSIS — I480 Paroxysmal atrial fibrillation: Secondary | ICD-10-CM | POA: Diagnosis not present

## 2024-03-22 ENCOUNTER — Ambulatory Visit: Payer: Medicare PPO | Admitting: Family Medicine

## 2024-04-09 ENCOUNTER — Encounter (INDEPENDENT_AMBULATORY_CARE_PROVIDER_SITE_OTHER): Payer: Medicare PPO | Admitting: Ophthalmology

## 2024-04-09 DIAGNOSIS — H43813 Vitreous degeneration, bilateral: Secondary | ICD-10-CM | POA: Diagnosis not present

## 2024-04-09 DIAGNOSIS — H353211 Exudative age-related macular degeneration, right eye, with active choroidal neovascularization: Secondary | ICD-10-CM

## 2024-04-09 DIAGNOSIS — I10 Essential (primary) hypertension: Secondary | ICD-10-CM

## 2024-04-09 DIAGNOSIS — H35033 Hypertensive retinopathy, bilateral: Secondary | ICD-10-CM | POA: Diagnosis not present

## 2024-04-09 DIAGNOSIS — H353122 Nonexudative age-related macular degeneration, left eye, intermediate dry stage: Secondary | ICD-10-CM

## 2024-04-11 DIAGNOSIS — N3040 Irradiation cystitis without hematuria: Secondary | ICD-10-CM | POA: Diagnosis not present

## 2024-04-11 DIAGNOSIS — E559 Vitamin D deficiency, unspecified: Secondary | ICD-10-CM | POA: Diagnosis not present

## 2024-04-11 DIAGNOSIS — I129 Hypertensive chronic kidney disease with stage 1 through stage 4 chronic kidney disease, or unspecified chronic kidney disease: Secondary | ICD-10-CM | POA: Diagnosis not present

## 2024-04-11 DIAGNOSIS — N183 Chronic kidney disease, stage 3 unspecified: Secondary | ICD-10-CM | POA: Diagnosis not present

## 2024-04-11 DIAGNOSIS — R809 Proteinuria, unspecified: Secondary | ICD-10-CM | POA: Diagnosis not present

## 2024-04-29 ENCOUNTER — Emergency Department (HOSPITAL_COMMUNITY)
Admission: EM | Admit: 2024-04-29 | Discharge: 2024-04-29 | Disposition: A | Discharge status: Home / Self Care | Attending: Emergency Medicine | Admitting: Emergency Medicine

## 2024-04-29 ENCOUNTER — Emergency Department (HOSPITAL_COMMUNITY)

## 2024-04-29 ENCOUNTER — Other Ambulatory Visit: Payer: Self-pay

## 2024-04-29 DIAGNOSIS — R519 Headache, unspecified: Secondary | ICD-10-CM | POA: Diagnosis not present

## 2024-04-29 DIAGNOSIS — I129 Hypertensive chronic kidney disease with stage 1 through stage 4 chronic kidney disease, or unspecified chronic kidney disease: Secondary | ICD-10-CM | POA: Diagnosis not present

## 2024-04-29 DIAGNOSIS — Z7901 Long term (current) use of anticoagulants: Secondary | ICD-10-CM | POA: Diagnosis not present

## 2024-04-29 DIAGNOSIS — Z79899 Other long term (current) drug therapy: Secondary | ICD-10-CM | POA: Insufficient documentation

## 2024-04-29 DIAGNOSIS — R030 Elevated blood-pressure reading, without diagnosis of hypertension: Secondary | ICD-10-CM | POA: Diagnosis present

## 2024-04-29 DIAGNOSIS — Z91040 Latex allergy status: Secondary | ICD-10-CM | POA: Diagnosis not present

## 2024-04-29 DIAGNOSIS — N189 Chronic kidney disease, unspecified: Secondary | ICD-10-CM | POA: Insufficient documentation

## 2024-04-29 DIAGNOSIS — I160 Hypertensive urgency: Secondary | ICD-10-CM | POA: Insufficient documentation

## 2024-04-29 LAB — COMPREHENSIVE METABOLIC PANEL WITH GFR
ALT: 19 U/L (ref 0–44)
AST: 22 U/L (ref 15–41)
Albumin: 3.8 g/dL (ref 3.5–5.0)
Alkaline Phosphatase: 70 U/L (ref 38–126)
Anion gap: 8 (ref 5–15)
BUN: 13 mg/dL (ref 8–23)
CO2: 26 mmol/L (ref 22–32)
Calcium: 9.2 mg/dL (ref 8.9–10.3)
Chloride: 102 mmol/L (ref 98–111)
Creatinine, Ser: 1.12 mg/dL (ref 0.61–1.24)
GFR, Estimated: >60 mL/min (ref >60)
Glucose, Bld: 89 mg/dL (ref 70–99)
Potassium: 3.8 mmol/L (ref 3.5–5.1)
Sodium: 136 mmol/L (ref 135–145)
Total Bilirubin: 0.9 mg/dL (ref 0.0–1.2)
Total Protein: 7.4 g/dL (ref 6.5–8.1)

## 2024-04-29 LAB — CBC WITH DIFFERENTIAL/PLATELET
Abs Immature Granulocytes: 0.01 10*3/uL (ref 0.00–0.07)
Basophils Absolute: 0 10*3/uL (ref 0.0–0.1)
Basophils Relative: 0 %
Eosinophils Absolute: 0.2 10*3/uL (ref 0.0–0.5)
Eosinophils Relative: 4 %
HCT: 40.6 % (ref 39.0–52.0)
Hemoglobin: 12.6 g/dL — ABNORMAL LOW (ref 13.0–17.0)
Immature Granulocytes: 0 %
Lymphocytes Relative: 35 %
Lymphs Abs: 1.5 10*3/uL (ref 0.7–4.0)
MCH: 22.2 pg — ABNORMAL LOW (ref 26.0–34.0)
MCHC: 31 g/dL (ref 30.0–36.0)
MCV: 71.6 fL — ABNORMAL LOW (ref 80.0–100.0)
Monocytes Absolute: 0.5 10*3/uL (ref 0.1–1.0)
Monocytes Relative: 12 %
Neutro Abs: 2 10*3/uL (ref 1.7–7.7)
Neutrophils Relative %: 49 %
Platelets: 164 10*3/uL (ref 150–400)
RBC: 5.67 MIL/uL (ref 4.22–5.81)
RDW: 15.9 % — ABNORMAL HIGH (ref 11.5–15.5)
WBC: 4.3 10*3/uL (ref 4.0–10.5)
nRBC: 0 % (ref 0.0–0.2)

## 2024-04-29 NOTE — ED Provider Notes (Signed)
 [place] EMERGENCY DEPARTMENT AT [facility] Provider Note   CSN: [id number] Arrival date & time: 04/29/24  1543     History  Chief Complaint  Patient presents with   Hypertension    Jeffrey Perry is a 78 y.o. male.  The history is provided by the patient and medical records. No language interpreter was used.  Hypertension     78 year old male history of paroxysmal atrial fibrillation currently on Xarelto , hypertension, aortic root enlargement, CKD presenting with concerns of elevated blood pressure.  Patient states his blood pressure is very well-controlled, he checks his blood pressure on a daily basis on a regular basis.  However yesterday he noticed that his blood pressure was elevated more than usual.  He also complaining of pressure sensation to his head.  This morning he woke up having some abdominal cramping.  States he normally have abdominal cramping and after having a bowel movement it usually resolved.  Today abdominal pain pain is little bit more intense but after having a bowel movement he felt better.  However, once again he noticed that his blood pressure was elevated.  He took all of his normal blood pressure medication but noticed no improvement thus prompting this visit.  He does not complain of any vision changes, confusion, nausea, vomiting, lightheadedness, dizziness, chest pain, trouble breathing, focal numbness or focal weakness.  He denies any other environmental changes.  Headache is described as a mild pressure sensation to the left side of his head and is persistent.  It is not acute onset thunderclap headache.  No vision changes.  Home Medications Prior to Admission medications   Medication Sig Start Date End Date Taking? Authorizing Provider  alfuzosin  (UROXATRAL ) 10 MG 24 hr tablet TAKE 1 TABLET BY MOUTH EVERY MORNING WITH BREAKFAST 10/10/23   Benjiman Bras, MD  amLODipine  (NORVASC ) 2.5 MG tablet Take 1 tablet (2.5 mg total) by mouth daily.  12/23/23   Benjiman Bras, MD  bevacizumab  (AVASTIN ) 2.5 mg/0.1 mL SOLN intravitreal injection 0.625 mg by Intravitreal route every 30 (thirty) days.    [provider]  ciprofloxacin (CILOXAN) 0.3 % ophthalmic solution Insert 1 drop in the right eye 4 times daily x 2 days, this is after his Avastin  injection 10/09/22   [provider]  diltiazem  (CARDIZEM ) 30 MG tablet Take 1 tablet (30 mg total) by mouth every 8 (eight) hours as needed. 10/26/23   Eilleen Grates, MD  losartan -hydrochlorothiazide  (HYZAAR) 100-25 MG tablet Take 1 tablet by mouth daily. 11/18/23   Benjiman Bras, MD  potassium chloride  (KLOR-CON  M) 10 MEQ tablet Take 1 tablet (10 mEq total) by mouth 2 (two) times daily. 11/18/23   Benjiman Bras, MD  rivaroxaban  (XARELTO ) 20 MG TABS tablet Take 1 tablet (20 mg total) by mouth daily with supper. 10/26/23   Eilleen Grates, MD  senna-docusate (SENOKOT-S) 8.6-50 MG tablet Take 1 tablet by mouth as needed for mild constipation. 02/03/23   [provider]  Vitamin D, Ergocalciferol, 50000 units CAPS Take 50,000 Units by mouth every 30 (thirty) days.    [provider]      Allergies    Latex, Lisinopril, Neuromuscular blocking agents, Oxycodone, Phenazopyridine hcl, Pyridium [phenazopyridine hcl], and Sulfonamide derivatives    Review of Systems   Review of Systems  All other systems reviewed and are negative.   Physical Exam Updated Vital Signs BP (!) 193/93 (BP Location: Left Arm)   Pulse 64   Temp 97.8 F (36.6 C) (  Oral)   Resp 18   SpO2 100%  Physical Exam Constitutional:      General: He is not in acute distress.    Appearance: He is well-developed.  HENT:     Head: Normocephalic and atraumatic.     Nose: Nose normal.  Eyes:     Extraocular Movements: Extraocular movements intact.     Conjunctiva/sclera: Conjunctivae normal.     Pupils: Pupils are equal, round, and reactive to light.  Cardiovascular:     Rate and  Rhythm: Normal rate and regular rhythm.     Pulses: Normal pulses.     Heart sounds: Normal heart sounds.  Pulmonary:     Effort: Pulmonary effort is normal.     Breath sounds: Normal breath sounds.  Abdominal:     Palpations: Abdomen is soft.  Musculoskeletal:     Cervical back: Normal range of motion and neck supple.  Skin:    Findings: No rash.  Neurological:     Mental Status: He is alert and oriented to person, place, and time.     GCS: GCS eye subscore is 4. GCS verbal subscore is 5. GCS motor subscore is 6.     Cranial Nerves: Cranial nerves 2-12 are intact.     Sensory: Sensation is intact.     Motor: Motor function is intact.     Coordination: Coordination is intact.     ED Results / Procedures / Treatments   Labs (all labs ordered are listed, but only abnormal results are displayed) Labs Reviewed  CBC WITH DIFFERENTIAL/PLATELET - Abnormal; Notable for the following components:      Result Value   Hemoglobin 12.6 (*)    MCV 71.6 (*)    MCH 22.2 (*)    RDW 15.9 (*)    All other components within normal limits  COMPREHENSIVE METABOLIC PANEL WITH GFR    EKG EKG Interpretation Date/Time:  Sunday April 29 2024 17:11:56 EDT Ventricular Rate:  49 PR Interval:  192 QRS Duration:  97 QT Interval:  467 QTC Calculation: 422 R Axis:   -62  Text Interpretation: Sinus bradycardia Left anterior fascicular block No acute changes No significant change since last tracing Confirmed by Guadalupe Lee ([id number]) on 04/29/2024 5:36:39 PM  Radiology CT Head Wo Contrast Result Date: 04/29/2024 CLINICAL DATA:  New onset headache.  Hypertension. EXAM: CT HEAD WITHOUT CONTRAST TECHNIQUE: Contiguous axial images were obtained from the base of the skull through the vertex without intravenous contrast. RADIATION DOSE REDUCTION: This exam was performed according to the departmental dose-optimization program which includes automated exposure control, adjustment of the mA and/or kV according to  patient size and/or use of iterative reconstruction technique. COMPARISON:  None Available. FINDINGS: Brain: No evidence of intracranial hemorrhage, acute infarction, hydrocephalus, extra-axial collection, or mass lesion/mass effect. Vascular:  No hyperdense vessel or other acute findings. Skull: No evidence of fracture or other significant bone abnormality. Sinuses/Orbits:  No acute findings. Other: None. IMPRESSION: Negative noncontrast head CT. Electronically Signed   By: Marlyce Sine M.D.   On: 04/29/2024 17:20    Procedures Procedures    Medications Ordered in ED Medications - No data to display  ED Course/ Medical Decision Making/ A&P                                 Medical Decision Making Amount and/or Complexity of Data Reviewed Labs: ordered. Radiology: ordered. ECG/medicine tests: ordered.  BP (!) 193/93 (BP Location: Left Arm)   Pulse 64   Temp 97.8 F (36.6 C) (Oral)   Resp 18   SpO2 100%   70:14 PM  78 year old male history of paroxysmal atrial fibrillation currently on Xarelto , hypertension, aortic root enlargement, CKD presenting with concerns of elevated blood pressure.  Patient states his blood pressure is very well-controlled, he checks his blood pressure on a daily basis on a regular basis.  However yesterday he noticed that his blood pressure was elevated more than usual.  He also complaining of pressure sensation to his head.  This morning he woke up having some abdominal cramping.  States he normally have abdominal cramping and after having a bowel movement it usually resolved.  Today abdominal pain pain is little bit more intense but after having a bowel movement he felt better.  However, once again he noticed that his blood pressure was elevated.  He took all of his normal blood pressure medication but noticed no improvement thus prompting this visit.  He does not complain of any vision changes, confusion, nausea, vomiting, lightheadedness, dizziness, chest pain,  trouble breathing, focal numbness or focal weakness.  He denies any other environmental changes.  Headache is described as a mild pressure sensation to the left side of his head and is persistent.  It is not acute onset thunderclap headache.  No vision changes.  On exam this is a well-appearing elderly male resting comfortably appears to be in no acute discomfort.  Scalp exam unremarkable.  Patient without any focal neurodeficit.  He has 5 out of 5 strength to all 4 extremities.  He is mentating appropriately.  Heart with normal rate and rhythm, lungs are clear to auscultation bilaterally abdomen is soft nontender exhibit no nuchal rigidity.  Currently vital sign notable for an elevated blood pressure of 183/93.  Patient is afebrile no hypoxia.  Given his age, history of high blood pressure but also on anticoagulant, will obtain head CT scan, will check labs and assess for potential endorgan damage.  However, suspicion is low.  -Labs ordered, independently viewed and interpreted by me.  Labs remarkable for reassuring lab values -The patient was maintained on a cardiac monitor.  I personally viewed and interpreted the cardiac monitored which showed an underlying rhythm of: Sinus bradycardia -Imaging independently viewed and interpreted by me and I agree with radiologist's interpretation.  Result remarkable for head CT scan without any concerning finding -This patient presents to the ED for concern of elevated blood pressure, this involves an extensive number of treatment options, and is a complaint that carries with it a high risk of complications and morbidity.  The differential diagnosis includes hypertension, hypertensive emergency, hypertensive urgency, intracranial hemorrhage, -Co morbidities that complicate the patient evaluation includes hypertension, CKD -Treatment includes monitoring -Reevaluation of the patient after these medicines showed that the patient improved -PCP office notes or outside  notes reviewed -Discussion with attending Dr. Moses Arenas -Escalation to admission/observation considered: patients feels much better, is comfortable with discharge, and will follow up with PCP -Prescription medication considered, patient comfortable with increased amlodipine  to 5 mg daily as needed for elevated blood pressure -Social Determinant of Health considered which includes lack of transportation         Final Clinical Impression(s) / ED Diagnoses Final diagnoses:  Hypertensive urgency    Rx / DC Orders ED Discharge Orders     None         Debbra Fairy, PA-C 04/29/24 1829    Moses Arenas,  Ernestina Headland, MD 04/30/24 1233

## 2024-04-29 NOTE — ED Triage Notes (Signed)
 Pt c/o elevated BP with a headache- states he did take his medication this AM.

## 2024-04-29 NOTE — Discharge Instructions (Addendum)
 You have been evaluated for your elevated blood pressure.  Fortunately CT scan of the head along with your labs did not show any concerning finding.  You may increase your amlodipine  from 2.5 mg daily to 5 mg daily if your blood pressure remains elevated above 140 systolic.  Follow-up closely with your doctor for reassessment.  Return if you have any concern.

## 2024-05-01 DIAGNOSIS — I10 Essential (primary) hypertension: Secondary | ICD-10-CM | POA: Diagnosis not present

## 2024-05-01 DIAGNOSIS — I480 Paroxysmal atrial fibrillation: Secondary | ICD-10-CM | POA: Diagnosis not present

## 2024-05-07 ENCOUNTER — Encounter (INDEPENDENT_AMBULATORY_CARE_PROVIDER_SITE_OTHER): Admitting: Ophthalmology

## 2024-05-07 DIAGNOSIS — I10 Essential (primary) hypertension: Secondary | ICD-10-CM

## 2024-05-07 DIAGNOSIS — H35033 Hypertensive retinopathy, bilateral: Secondary | ICD-10-CM | POA: Diagnosis not present

## 2024-05-07 DIAGNOSIS — H43813 Vitreous degeneration, bilateral: Secondary | ICD-10-CM | POA: Diagnosis not present

## 2024-05-07 DIAGNOSIS — H353211 Exudative age-related macular degeneration, right eye, with active choroidal neovascularization: Secondary | ICD-10-CM

## 2024-05-07 DIAGNOSIS — H353122 Nonexudative age-related macular degeneration, left eye, intermediate dry stage: Secondary | ICD-10-CM

## 2024-05-18 ENCOUNTER — Telehealth: Payer: Self-pay | Admitting: Family Medicine

## 2024-05-18 NOTE — Telephone Encounter (Deleted)
 Copied from CRM [id number]. Topic: Clinical - Medication Refill >> May 18, 2024  2:01 PM Alyse July wrote: Medication: potassium chloride  (KLOR-CON  M) 10 MEQ tablet  Has the patient contacted their pharmacy? Yes  This is the patient's preferred pharmacy:  [facility] [id number] - [place], [place] - [address] [place] [address] Phone: [phone] Fax: [phone]  Is this the correct pharmacy for this prescription? Yes If no, delete pharmacy and type the correct one.   Has the prescription been filled recently? No  Is the patient out of the medication? No(PT WILL BE OUT OF MEDICATION ON SUNDAY-05/20/24)  Has the patient been seen for an appointment in the last year OR does the patient have an upcoming appointment? Yes  Can we respond through MyChart? Yes  Agent: Please be advised that Rx refills may take up to 3 business days. We ask that you follow-up with your pharmacy.

## 2024-05-21 ENCOUNTER — Encounter: Payer: Self-pay | Admitting: Family Medicine

## 2024-05-21 ENCOUNTER — Ambulatory Visit: Admitting: Family Medicine

## 2024-05-21 VITALS — BP 130/76 | HR 57 | Temp 98.0°F | Resp 16 | Ht 73.0 in | Wt 240.4 lb

## 2024-05-21 DIAGNOSIS — R002 Palpitations: Secondary | ICD-10-CM

## 2024-05-21 DIAGNOSIS — I10 Essential (primary) hypertension: Secondary | ICD-10-CM

## 2024-05-21 DIAGNOSIS — I480 Paroxysmal atrial fibrillation: Secondary | ICD-10-CM | POA: Diagnosis not present

## 2024-05-21 DIAGNOSIS — E876 Hypokalemia: Secondary | ICD-10-CM

## 2024-05-21 LAB — CBC
HCT: 38 % — ABNORMAL LOW (ref 39.0–52.0)
Hemoglobin: 12.1 g/dL — ABNORMAL LOW (ref 13.0–17.0)
MCHC: 31.9 g/dL (ref 30.0–36.0)
MCV: 69.6 fl — ABNORMAL LOW (ref 78.0–100.0)
Platelets: 158 10*3/uL (ref 150.0–400.0)
RBC: 5.45 Mil/uL (ref 4.22–5.81)
RDW: 15.2 % (ref 11.5–15.5)
WBC: 4.3 10*3/uL (ref 4.0–10.5)

## 2024-05-21 LAB — TSH: TSH: 2.06 u[IU]/mL (ref 0.35–5.50)

## 2024-05-21 LAB — MAGNESIUM: Magnesium: 2.1 mg/dL (ref 1.5–2.5)

## 2024-05-21 MED ORDER — POTASSIUM CHLORIDE CRYS ER 10 MEQ PO TBCR: 10.0000 meq | EXTENDED_RELEASE_TABLET | Freq: Two times a day (BID) | ORAL | 1 refills | Status: DC

## 2024-05-21 NOTE — Progress Notes (Signed)
 Subjective:  Patient ID: Jeffrey Perry, male    DOB: 04-Apr-1946  Age: 78 y.o. MRN: [id number]  CC:  Chief Complaint  Patient presents with   Hospitalization Follow-up    Pt notes went to ER for hypertension and irregular HR, notes was doing better but feels like it has started again about a week ago,     HPI Jeffrey Perry presents for   ER follow-up for hypertension urgency, paroxysmal atrial fibrillation ER note reviewed from 04/29/2024.  History of paroxysmal atrial fibrillation on Xarelto , with history of hypertension, aortic root enlargement, CKD presented with concern of elevated blood pressure on home reading, with pressure sensation in his head, abdominal cramping that morning.  Improves after bowel movement.  Had not missed any of his meds.  Blood pressure 193/93, nonfocal neuroexam.  EKG with rate of 49, QTc 422.  PR 192.  Read as sinus bradycardia with LAFB, no acute changes.  Negative noncontrast head CT. Amlodipine  was increased to 5 mg daily as needed for elevated blood pressure, then PCP follow-up recommended. Repeat BP 177/88. Hemoglobin 12.6 on June 1, minimal change from 13.1 in January, 12.7 in December 2024.  No known trigger for prior elevated BP.   He did follow-up with [facility] on 05/01/2024.  Was continued on 5 mg amlodipine  since the ER visit.  Has 30 mg diltiazem  to take as needed for palpitations, continue to hold diltiazem  120 mg daily, was continued on Xarelto .  Continued on amlodipine  5 mg daily with 1 year follow-up.  Has not had recent elevated blood pressure since ER, or return of HA/pressure.  Home blood pressures since ER visit: 106-136/72-86, with HR 55-79.  Took 30mg  diltiazem  yesterday and once last week for fast HR/palpitations. No associated chest pain/dizziness. Left shoulder pain when woke up this am but no CP/neck pain/back pain. No associated palpitations or dyspnea.   Traveled to [place] March through May. BP controlled there - under  120.   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 Returned on Tuesday - noted palpitations that day. Felt palpitations yesterday and Saturday. Avoided outdoor walks last week.      BP Readings from Last 3 Encounters:  05/21/24 130/76  04/29/24 (!) 177/88  12/23/23 130/74     History Patient Active Problem List   Diagnosis Date Noted   History of bradycardia 12/18/2023   Prediabetes 12/18/2023   Paroxysmal atrial fibrillation (HCC) 12/15/2023   Hypercoagulable state due to paroxysmal atrial fibrillation (HCC) 12/15/2023   Paget disease of bone: Pelvis and sacrum. 11/23/2023   Aortic root enlargement (HCC) 03/24/2023   S/P laparoscopic cholecystectomy 02/02/2023   Tricuspid valve insufficiency 09/25/2020   Palpitations 09/25/2020   CKD (chronic kidney disease), stage II 09/25/2020   Murmur 07/18/2017   Elevated LFTs    Abdominal pain 04/30/2016   Renal insufficiency 03/05/2013   HTN (hypertension) 03/06/2012   Past Medical History:  Diagnosis Date   Cataract    SMALL ONE IN ONE EYE LEFT   Chronic kidney disease    STAGE 2   Heart murmur    Hx of adenomatous polyp of colon 03/31/2023   Hypertension    Paget disease of bone    Questionable diagnosis.    Prostate cancer (HCC)    Sickle cell trait (HCC)    Tricuspid regurgitation    Past Surgical History:  Procedure Laterality Date   BLADDER SURGERY     CHOLECYSTECTOMY  02/02/2023   COLONOSCOPY  FINGER GANGLION CYST EXCISION  2011   3rd finger right hand   HYDROCELE EXCISION  2012   PENILE PROSTHESIS IMPLANT     TRIGGER FINGER RELEASE Left 08/2021   thumb   Allergies  Allergen Reactions   Latex Itching   Lisinopril Cough   Neuromuscular Blocking Agents Swelling    Ankle swelling Ankle swelling   Oxycodone Itching   Phenazopyridine Hcl Swelling    Ankle swelling   Pyridium [Phenazopyridine Hcl] Swelling    Ankle swelling   Sulfonamide Derivatives Itching   Prior to Admission medications    Medication Sig Start Date End Date Taking? Authorizing Provider  alfuzosin  (UROXATRAL ) 10 MG 24 hr tablet TAKE 1 TABLET BY MOUTH EVERY MORNING WITH BREAKFAST 10/10/23  Yes Levora Reyes SAUNDERS, MD  amLODipine  (NORVASC ) 2.5 MG tablet Take 1 tablet (2.5 mg total) by mouth daily. Patient taking differently: Take 5 mg by mouth daily. 12/23/23  Yes Levora Reyes SAUNDERS, MD  bevacizumab  (AVASTIN ) 2.5 mg/0.1 mL SOLN intravitreal injection 0.625 mg by Intravitreal route every 30 (thirty) days.   Yes [provider]  ciprofloxacin (CILOXAN) 0.3 % ophthalmic solution Insert 1 drop in the right eye 4 times daily x 2 days, this is after his Avastin  injection 10/09/22  Yes [provider]  diltiazem  (CARDIZEM ) 30 MG tablet Take 1 tablet (30 mg total) by mouth every 8 (eight) hours as needed. 10/26/23  Yes Lavona Agent, MD  losartan -hydrochlorothiazide  (HYZAAR) 100-25 MG tablet Take 1 tablet by mouth daily. 11/18/23  Yes Levora Reyes SAUNDERS, MD  potassium chloride  (KLOR-CON  M) 10 MEQ tablet Take 1 tablet (10 mEq total) by mouth 2 (two) times daily. 11/18/23  Yes Levora Reyes SAUNDERS, MD  rivaroxaban  (XARELTO ) 20 MG TABS tablet Take 1 tablet (20 mg total) by mouth daily with supper. 10/26/23  Yes Lavona Agent, MD  senna-docusate (SENOKOT-S) 8.6-50 MG tablet Take 1 tablet by mouth as needed for mild constipation. 02/03/23  Yes [provider]  Vitamin D, Ergocalciferol, 50000 units CAPS Take 50,000 Units by mouth every 30 (thirty) days.   Yes [provider]   Social History   Socioeconomic History   Marital status: Married    Spouse name: Not on file   Number of children: Not on file   Years of education: Not on file   Highest education level: Doctorate  Occupational History   Occupation: professor    Employer: [employer]  Tobacco Use   Smoking status: Never    Passive exposure: Never   Smokeless tobacco: Never  Vaping Use   Vaping status: Never Used  Substance and Sexual  Activity   Alcohol use: Yes    Comment: Beer occ   Drug use: No   Sexual activity: Yes    Partners: Female  Other Topics Concern   Not on file  Social History Narrative   Not on file   Social Drivers of Health   Financial Resource Strain: Low Risk  (12/18/2023)   Received from [facility]   Overall Financial Resource Strain (CARDIA)    Difficulty of Paying Living Expenses: Not hard at all  Food Insecurity: No Food Insecurity (12/18/2023)   Received from [facility]   Hunger Vital Sign    Within the past 12 months, you worried that your food would run out before you got the money to buy more.: Never true    Within the past 12 months, the food you bought just didn't last and you didn't  have money to get more.: Never true  Transportation Needs: Unmet Transportation Needs (12/18/2023)   Received from [facility] - Transportation    In the past 12 months, has lack of transportation kept you from medical appointments or from getting medications?: Yes    Lack of Transportation (Non-Medical): No  Physical Activity: Sufficiently Active (09/15/2023)   Exercise Vital Sign    Days of Exercise per Week: 3 days    Minutes of Exercise per Session: 60 min  Stress: No Stress Concern Present (09/15/2023)   [employer] of Occupational Health - Occupational Stress Questionnaire    Feeling of Stress : Not at all  Social Connections: Moderately Integrated (09/15/2023)   Social Connection and Isolation Panel    Frequency of Communication with Friends and Family: More than three times a week    Frequency of Social Gatherings with Friends and Family: More than three times a week    Attends Religious Services: 1 to 4 times per year    Active Member of [employer] or Organizations: Not on file    Attends Banker Meetings: Never    Marital Status: Married  Catering manager Violence: Not At Risk (09/15/2023)   Humiliation,  Afraid, Rape, and Kick questionnaire    Fear of Current or Ex-Partner: No    Emotionally Abused: No    Physically Abused: No    Sexually Abused: No    Review of Systems   Objective:   Vitals:   05/21/24 1026  BP: 130/76  Pulse: (!) 57  Resp: 16  Temp: 98 F (36.7 C)  TempSrc: Temporal  SpO2: 100%  Weight: 240 lb 6.4 oz (109 kg)  Height: 6' 1 (1.854 m)     Physical Exam Vitals reviewed.  Constitutional:      Appearance: He is well-developed.  HENT:     Head: Normocephalic and atraumatic.  Neck:     Vascular: No carotid bruit or JVD.   Cardiovascular:     Rate and Rhythm: Normal rate and regular rhythm.     Heart sounds: Normal heart sounds. No murmur heard. Pulmonary:     Effort: Pulmonary effort is normal.     Breath sounds: Normal breath sounds. No rales.   Musculoskeletal:     Right lower leg: No edema.     Left lower leg: No edema.     Comments: Pain free ROM of left shoulder.    Skin:    General: Skin is warm and dry.   Neurological:     Mental Status: He is alert and oriented to person, place, and time.   Psychiatric:        Mood and Affect: Mood normal.    EKG:  Sinus bradycardia with rate 48, LAFB, PR 180, QTc 400.  No apparent acute findings or acute changes compared to prior EKG on 04/29/2024.  At that time had sinus bradycardia with LAFB.    Assessment & Plan:  Jeffrey Perry is a 78 y.o. male . Paroxysmal atrial fibrillation (HCC) - Plan: TSH, CBC, EKG 12-Lead, Magnesium  Hypokalemia - Plan: potassium chloride  (KLOR-CON  M) 10 MEQ tablet  Essential hypertension - Plan: TSH, CBC, EKG 12-Lead, Magnesium  Palpitations - Plan: TSH, CBC, EKG 12-Lead, Magnesium  Episode of elevated blood pressure in ER few weeks ago, no recurrence.  Unlikely secondary hypertension, unlikely pheochromocytoma, continue to monitor for recurrence but has been stable, no change in regimen for now.  History of proximal A-fib with flutter  palpitations last week, last  weekend, treated with diltiazem  short acting twice.  Asymptomatic today, no acute findings on EKG in sinus rhythm with sinus bradycardia today but asymptomatic.  Shoulder pain this morning without any chest pain or recurrence of symptoms and asymptomatic currently.  Question muscular cause but again reassuring in office.  ER/RTC precautions given.  With recent flare palpitations I will check TSH, magnesium and CBC although that appears stable overall on most recent labs.  Meds ordered this encounter  Medications   potassium chloride  (KLOR-CON  M) 10 MEQ tablet    Sig: Take 1 tablet (10 mEq total) by mouth 2 (two) times daily.    Dispense:  180 tablet    Refill:  1   Patient Instructions  Exam is reassuring today.  I will check some labs but continue same plan from cardiology with as needed 30 mg diltiazem  for flares of palpitations/A-fib.  Change in diet, travel may have contributed to most recent flare.  If any concerns on labs I will let you know.  If any return of shoulder pain or new symptoms be seen but I do not expect that to occur.  Blood pressure looks okay on most recent readings.  No other changes for now.  Take care.     Signed,   Reyes Pines, MD [facility], [facility] 05/21/24 11:39 AM

## 2024-05-21 NOTE — Patient Instructions (Signed)
 Exam is reassuring today.  I will check some labs but continue same plan from cardiology with as needed 30 mg diltiazem  for flares of palpitations/A-fib.  Change in diet, travel may have contributed to most recent flare.  If any concerns on labs I will let you know.  If any return of shoulder pain or new symptoms be seen but I do not expect that to occur.  Blood pressure looks okay on most recent readings.  No other changes for now.  Take care.

## 2024-05-22 NOTE — Progress Notes (Signed)
 Office Visit Note  [name]             Date of Birth: 1946-04-18           MRN: [id number]             PCP: Levora Reyes SAUNDERS, MD Referring: Levora Reyes SAUNDERS, MD Visit Date: 06/05/2024 Occupation: @GUAROCC @  Subjective:  Paget's disease  History of Present Illness: Cordero Surette is a 78 y.o. male with Paget's disease of the pelvis.  He returns today after his last visit in January 2025.  He denies any increased joint pain or joint discomfort.  He states he had an episode of increased blood pressure which normalized.  He has been on combination of new medications since January which has been working well.    Activities of Daily Living:  Patient reports morning stiffness for 0 minutes.   Patient Denies nocturnal pain.  Difficulty dressing/grooming: Denies Difficulty climbing stairs: Denies Difficulty getting out of chair: Denies Difficulty using hands for taps, buttons, cutlery, and/or writing: Denies  Review of Systems  Constitutional:  Negative for fatigue.  HENT:  Negative for mouth sores and mouth dryness.   Eyes:  Positive for dryness.  Respiratory:  Negative for shortness of breath.   Cardiovascular:  Negative for chest pain and palpitations.  Gastrointestinal:  Negative for blood in stool, constipation and diarrhea.  Endocrine: Negative for increased urination.  Genitourinary:  Negative for involuntary urination.  Musculoskeletal:  Positive for joint swelling. Negative for joint pain, gait problem, joint pain, myalgias, muscle weakness, morning stiffness, muscle tenderness and myalgias.  Skin:  Negative for color change, rash, hair loss and sensitivity to sunlight.  Allergic/Immunologic: Negative for susceptible to infections.  Neurological:  Negative for dizziness and headaches.  Hematological:  Negative for swollen glands.  Psychiatric/Behavioral:  Negative for depressed mood and sleep disturbance. The patient is not nervous/anxious.     PMFS History:   Patient Active Problem List   Diagnosis Date Noted   History of bradycardia 12/18/2023   Prediabetes 12/18/2023   Paroxysmal atrial fibrillation (HCC) 12/15/2023   Hypercoagulable state due to paroxysmal atrial fibrillation (HCC) 12/15/2023   Paget disease of bone: Pelvis and sacrum. 11/23/2023   Aortic root enlargement (HCC) 03/24/2023   S/P laparoscopic cholecystectomy 02/02/2023   Tricuspid valve insufficiency 09/25/2020   Palpitations 09/25/2020   CKD (chronic kidney disease), stage II 09/25/2020   Murmur 07/18/2017   Elevated LFTs    Abdominal pain 04/30/2016   Renal insufficiency 03/05/2013   HTN (hypertension) 03/06/2012    Past Medical History:  Diagnosis Date   Atrial fibrillation (HCC)    Cataract    SMALL ONE IN ONE EYE LEFT   Chronic kidney disease    STAGE 2   Heart murmur    Hx of adenomatous polyp of colon 03/31/2023   Hypertension    Paget disease of bone    Questionable diagnosis.    Prostate cancer (HCC)    Sickle cell trait (HCC)    Tricuspid regurgitation     Family History  Problem Relation Age of Onset   Hypertension Mother    Hypertension Sister    Stroke Sister    Hypertension Sister    Hypertension Brother    Hypertension Brother    Colon cancer Neg Hx    Colon polyps Neg Hx    Crohn's disease Neg Hx    Esophageal cancer Neg Hx    Rectal cancer Neg Hx    Stomach  cancer Neg Hx    Ulcerative colitis Neg Hx    Past Surgical History:  Procedure Laterality Date   BLADDER SURGERY     CHOLECYSTECTOMY  02/02/2023   COLONOSCOPY     FINGER GANGLION CYST EXCISION  2011   3rd finger right hand   HYDROCELE EXCISION  2012   PENILE PROSTHESIS IMPLANT     TRIGGER FINGER RELEASE Left 08/2021   thumb   Social History   Social History Narrative   Not on file   Immunization History  Administered Date(s) Administered   DTaP 03/22/2008   Gamma Globulin 06/08/1994   H1N1 10/03/2004   Hepatitis A 03/29/1996, 06/10/1998   Hepatitis B  03/22/2008, 10/07/2008   IPV 03/18/2003   Meningococcal Conjugate 03/22/2008   PFIZER(Purple Top)SARS-COV-2 Vaccination 01/20/2020, 02/12/2020, 07/30/2020   Pneumococcal Conjugate-13 05/02/2010, 01/31/2019   Pneumococcal Polysaccharide-23 03/04/2014   Pneumococcal-Unspecified 05/02/2010   Tdap 03/22/2008, 03/24/2018   Typhoid Live 04/12/2017   Typhoid Parenteral 05/29/2002   Yellow Fever 03/22/2008   Zoster, Live 03/29/2010     Objective: Vital Signs: BP 105/70 (BP Location: Left Arm, Patient Position: Sitting, Cuff Size: Large)   Pulse 60   Resp 12   Ht 6' 1 (1.854 m)   Wt 236 lb 9.6 oz (107.3 kg)   BMI 31.22 kg/m    Physical Exam Vitals and nursing note reviewed.  Constitutional:      Appearance: He is well-developed.  HENT:     Head: Normocephalic and atraumatic.  Eyes:     Conjunctiva/sclera: Conjunctivae normal.     Pupils: Pupils are equal, round, and reactive to light.  Cardiovascular:     Rate and Rhythm: Normal rate and regular rhythm.     Heart sounds: Normal heart sounds.  Pulmonary:     Effort: Pulmonary effort is normal.     Breath sounds: Normal breath sounds.  Abdominal:     General: Bowel sounds are normal.     Palpations: Abdomen is soft.  Musculoskeletal:     Cervical back: Normal range of motion and neck supple.  Skin:    General: Skin is warm and dry.     Capillary Refill: Capillary refill takes less than 2 seconds.  Neurological:     Mental Status: He is alert and oriented to person, place, and time.  Psychiatric:        Behavior: Behavior normal.      Musculoskeletal Exam: Cervical, thoracic and lumbar spine with good range of motion.  Shoulders, elbows, wrist joints, MCPs PIPs and DIPs with good range of motion with no synovitis.  Hip joints and knee joints in good range of motion without any tenderness.  There was no tenderness over ankles or MTPs.  CDAI Exam: CDAI Score: -- Patient Global: --; Provider Global: -- Swollen: --; Tender:  -- Joint Exam 06/05/2024   No joint exam has been documented for this visit   There is currently no information documented on the homunculus. Go to the Rheumatology activity and complete the homunculus joint exam.  Investigation: No additional findings.  Imaging: No results found.   Recent Labs: Lab Results  Component Value Date   WBC 4.9 05/30/2024   HGB 12.3 (L) 05/30/2024   PLT 160.0 05/30/2024   NA 136 04/29/2024   K 3.8 04/29/2024   CL 102 04/29/2024   CO2 26 04/29/2024   GLUCOSE 89 04/29/2024   BUN 13 04/29/2024   CREATININE 1.12 04/29/2024   BILITOT 0.9 04/29/2024   ALKPHOS 70  04/29/2024   AST 22 04/29/2024   ALT 19 04/29/2024   PROT 7.4 04/29/2024   ALBUMIN 3.8 04/29/2024   CALCIUM 9.2 04/29/2024   GFRAA 60 06/04/2021    Speciality Comments: No specialty comments available.  Procedures:  No procedures performed Allergies: Latex, Lisinopril, Neuromuscular blocking agents, Oxycodone, Phenazopyridine hcl, Pyridium [phenazopyridine hcl], and Sulfonamide derivatives   Assessment / Plan:     Visit Diagnoses: Paget disease of bone: Pelvis and sacrum. - Pelvis and sacrum. IV Reclast  5 mg on May 09, 2020.November 28, 2023 CMP creatinine 1.44, alkaline phosphatase 81, AST 20, ALT 23.  Alk phos remained stable.  April 29, 2024 alkaline phosphatase 70.  He had no tenderness in the pelvic region or hip region.  Will recheck labs in 6 months.- Plan: Comprehensive metabolic panel with GFR  Stage 3a chronic kidney disease (HCC) - Followed by Dr. Rayburn.  Creatinine was elevated at 1.44 on November 28, 2023.  Patient related to chronic diarrhea from cholecystectomy.  Repeat creatinine 1.12 on April 29, 2024.  Essential hypertension-patient states recently his blood pressure was elevated which normalized.  Blood pressure was normal today at 105/70.  He is on losartan /HCTZ, Cardizem  and Norvasc  combination.  Paroxysmal atrial fibrillation-followed by cardiology  Chronic  anticoagulation-he is on Xarelto .  Prostate cancer (HCC)  S/P cholecystectomy  Orders: Orders Placed This Encounter  Procedures   Comprehensive metabolic panel with GFR   No orders of the defined types were placed in this encounter.    Follow-Up Instructions: Return in about 6 months (around 12/06/2024) for pagets disease.   Maya Nash, MD  Note - This record has been created using Animal nutritionist.  Chart creation errors have been sought, but may not always  have been located. Such creation errors do not reflect on  the standard of medical care.

## 2024-05-23 ENCOUNTER — Ambulatory Visit: Payer: Self-pay | Admitting: Family Medicine

## 2024-05-23 DIAGNOSIS — H5203 Hypermetropia, bilateral: Secondary | ICD-10-CM | POA: Diagnosis not present

## 2024-05-23 DIAGNOSIS — H2513 Age-related nuclear cataract, bilateral: Secondary | ICD-10-CM | POA: Diagnosis not present

## 2024-05-23 DIAGNOSIS — D509 Iron deficiency anemia, unspecified: Secondary | ICD-10-CM

## 2024-05-30 ENCOUNTER — Other Ambulatory Visit (INDEPENDENT_AMBULATORY_CARE_PROVIDER_SITE_OTHER)

## 2024-05-30 DIAGNOSIS — D509 Iron deficiency anemia, unspecified: Secondary | ICD-10-CM | POA: Diagnosis not present

## 2024-05-30 LAB — CBC WITH DIFFERENTIAL/PLATELET
Basophils Absolute: 0 10*3/uL (ref 0.0–0.1)
Basophils Relative: 0.5 % (ref 0.0–3.0)
Eosinophils Absolute: 0.2 10*3/uL (ref 0.0–0.7)
Eosinophils Relative: 3.5 % (ref 0.0–5.0)
HCT: 38.1 % — ABNORMAL LOW (ref 39.0–52.0)
Hemoglobin: 12.3 g/dL — ABNORMAL LOW (ref 13.0–17.0)
Lymphocytes Relative: 37.3 % (ref 12.0–46.0)
Lymphs Abs: 1.8 10*3/uL (ref 0.7–4.0)
MCHC: 32.2 g/dL (ref 30.0–36.0)
MCV: 69 fl — ABNORMAL LOW (ref 78.0–100.0)
Monocytes Absolute: 0.6 10*3/uL (ref 0.1–1.0)
Monocytes Relative: 12.3 % — ABNORMAL HIGH (ref 3.0–12.0)
Neutro Abs: 2.3 10*3/uL (ref 1.4–7.7)
Neutrophils Relative %: 46.4 % (ref 43.0–77.0)
Platelets: 160 10*3/uL (ref 150.0–400.0)
RBC: 5.53 Mil/uL (ref 4.22–5.81)
RDW: 15.1 % (ref 11.5–15.5)
WBC: 4.9 10*3/uL (ref 4.0–10.5)

## 2024-05-31 LAB — IRON,TIBC AND FERRITIN PANEL
%SAT: 27 % (ref 20–48)
Ferritin: 179 ng/mL (ref 24–380)
Iron: 93 ug/dL (ref 50–180)
TIBC: 343 ug/dL (ref 250–425)

## 2024-06-03 ENCOUNTER — Ambulatory Visit: Payer: Self-pay | Admitting: Family Medicine

## 2024-06-03 DIAGNOSIS — D649 Anemia, unspecified: Secondary | ICD-10-CM

## 2024-06-04 ENCOUNTER — Other Ambulatory Visit: Payer: Self-pay | Admitting: *Deleted

## 2024-06-04 DIAGNOSIS — M889 Osteitis deformans of unspecified bone: Secondary | ICD-10-CM

## 2024-06-05 ENCOUNTER — Ambulatory Visit: Payer: Medicare PPO | Attending: Rheumatology | Admitting: Rheumatology

## 2024-06-05 ENCOUNTER — Encounter: Payer: Self-pay | Admitting: Rheumatology

## 2024-06-05 VITALS — BP 105/70 | HR 60 | Resp 12 | Ht 73.0 in | Wt 236.6 lb

## 2024-06-05 DIAGNOSIS — I480 Paroxysmal atrial fibrillation: Secondary | ICD-10-CM

## 2024-06-05 DIAGNOSIS — I10 Essential (primary) hypertension: Secondary | ICD-10-CM | POA: Diagnosis not present

## 2024-06-05 DIAGNOSIS — Z7901 Long term (current) use of anticoagulants: Secondary | ICD-10-CM

## 2024-06-05 DIAGNOSIS — C61 Malignant neoplasm of prostate: Secondary | ICD-10-CM

## 2024-06-05 DIAGNOSIS — Z9049 Acquired absence of other specified parts of digestive tract: Secondary | ICD-10-CM

## 2024-06-05 DIAGNOSIS — M889 Osteitis deformans of unspecified bone: Secondary | ICD-10-CM | POA: Diagnosis not present

## 2024-06-05 DIAGNOSIS — R600 Localized edema: Secondary | ICD-10-CM

## 2024-06-05 DIAGNOSIS — N1831 Chronic kidney disease, stage 3a: Secondary | ICD-10-CM | POA: Diagnosis not present

## 2024-06-05 NOTE — Patient Instructions (Signed)
 Standing Labs We placed an order today for your standing lab work.   Please have your standing labs drawn in January  Please have your labs drawn 2 weeks prior to your appointment so that the provider can discuss your lab results at your appointment, if possible.  Please note that you may see your imaging and lab results in MyChart before we have reviewed them. We will contact you once all results are reviewed. Please allow our office up to 72 hours to thoroughly review all of the results before contacting the office for clarification of your results.  WALK-IN LAB HOURS  Monday through Thursday from 8:00 am -12:30 pm and 1:00 pm-4:30 pm and Friday from 8:00 am-12:00 pm.  Patients with office visits requiring labs will be seen before walk-in labs.  You may encounter longer than normal wait times. Please allow additional time. Wait times may be shorter on  Monday and Thursday afternoons.  We do not book appointments for walk-in labs. We appreciate your patience and understanding with our staff.   Labs are drawn by [facility]. Please bring your co-pay at the time of your lab draw.  You may receive a bill from [facility] for your lab work.  Please note if you are on Hydroxychloroquine and and an order has been placed for a Hydroxychloroquine level,  you will need to have it drawn 4 hours or more after your last dose.  If you wish to have your labs drawn at another location, please call the office 24 hours in advance so we can fax the orders.  The office is located at [address], [place], [place] [address]   If you have any questions regarding directions or hours of operation,  please call [phone].   As a reminder, please drink plenty of water prior to coming for your lab work. Thanks!

## 2024-06-06 ENCOUNTER — Encounter (INDEPENDENT_AMBULATORY_CARE_PROVIDER_SITE_OTHER): Admitting: Ophthalmology

## 2024-06-06 DIAGNOSIS — H353122 Nonexudative age-related macular degeneration, left eye, intermediate dry stage: Secondary | ICD-10-CM

## 2024-06-06 DIAGNOSIS — H35033 Hypertensive retinopathy, bilateral: Secondary | ICD-10-CM | POA: Diagnosis not present

## 2024-06-06 DIAGNOSIS — H43813 Vitreous degeneration, bilateral: Secondary | ICD-10-CM | POA: Diagnosis not present

## 2024-06-06 DIAGNOSIS — I10 Essential (primary) hypertension: Secondary | ICD-10-CM

## 2024-06-06 DIAGNOSIS — H353211 Exudative age-related macular degeneration, right eye, with active choroidal neovascularization: Secondary | ICD-10-CM | POA: Diagnosis not present

## 2024-06-07 ENCOUNTER — Other Ambulatory Visit (INDEPENDENT_AMBULATORY_CARE_PROVIDER_SITE_OTHER)

## 2024-06-07 DIAGNOSIS — M889 Osteitis deformans of unspecified bone: Secondary | ICD-10-CM

## 2024-06-07 DIAGNOSIS — D649 Anemia, unspecified: Secondary | ICD-10-CM

## 2024-06-07 LAB — CBC WITH DIFFERENTIAL/PLATELET
Basophils Absolute: 0 K/uL (ref 0.0–0.1)
Basophils Relative: 0.4 % (ref 0.0–3.0)
Eosinophils Absolute: 0.2 K/uL (ref 0.0–0.7)
Eosinophils Relative: 4.1 % (ref 0.0–5.0)
HCT: 38.7 % — ABNORMAL LOW (ref 39.0–52.0)
Hemoglobin: 12.2 g/dL — ABNORMAL LOW (ref 13.0–17.0)
Lymphocytes Relative: 30 % (ref 12.0–46.0)
Lymphs Abs: 1.3 K/uL (ref 0.7–4.0)
MCHC: 31.6 g/dL (ref 30.0–36.0)
MCV: 70.1 fl — ABNORMAL LOW (ref 78.0–100.0)
Monocytes Absolute: 0.4 K/uL (ref 0.1–1.0)
Monocytes Relative: 10.2 % (ref 3.0–12.0)
Neutro Abs: 2.4 K/uL (ref 1.4–7.7)
Neutrophils Relative %: 55.3 % (ref 43.0–77.0)
Platelets: 177 K/uL (ref 150.0–400.0)
RBC: 5.53 Mil/uL (ref 4.22–5.81)
RDW: 15.2 % (ref 11.5–15.5)
WBC: 4.4 K/uL (ref 4.0–10.5)

## 2024-06-10 ENCOUNTER — Ambulatory Visit: Payer: Self-pay | Admitting: Family Medicine

## 2024-06-11 ENCOUNTER — Encounter: Payer: Self-pay | Admitting: Family Medicine

## 2024-06-11 DIAGNOSIS — D649 Anemia, unspecified: Secondary | ICD-10-CM

## 2024-06-11 NOTE — Telephone Encounter (Signed)
 Patient has questions regarding lab results with anemia.

## 2024-06-12 NOTE — Telephone Encounter (Signed)
Patient is okay with referral. 

## 2024-06-12 NOTE — Addendum Note (Signed)
 Addended by: Tya Haughey R on: 06/12/2024 11:56 AM   Modules accepted: Orders

## 2024-06-19 ENCOUNTER — Encounter: Payer: Self-pay | Admitting: Cardiology

## 2024-07-03 ENCOUNTER — Ambulatory Visit (HOSPITAL_COMMUNITY)
Admission: RE | Admit: 2024-07-03 | Discharge: 2024-07-03 | Disposition: A | Discharge status: Home / Self Care | Source: Ambulatory Visit | Attending: Internal Medicine | Admitting: Internal Medicine

## 2024-07-03 VITALS — BP 130/76 | HR 68 | Ht 73.0 in | Wt 237.2 lb

## 2024-07-03 DIAGNOSIS — I4891 Unspecified atrial fibrillation: Secondary | ICD-10-CM

## 2024-07-03 DIAGNOSIS — I480 Paroxysmal atrial fibrillation: Secondary | ICD-10-CM

## 2024-07-03 DIAGNOSIS — D6869 Other thrombophilia: Secondary | ICD-10-CM

## 2024-07-03 NOTE — Progress Notes (Signed)
 Primary Care Physician: Levora Reyes SAUNDERS, MD Primary Cardiologist: Lynwood Schilling, MD Electrophysiologist: None     Referring Physician: Dr. Schilling Jeffrey Perry is a 78 y.o. male with a history of HTN, mild dilatation of aorta, tricuspid valve insufficiency, CKD, and paroxysmal atrial fibrillation who presents for consultation in the [facility]. Cardiac monitor 11-10/2023 showed 3% PAF burden. Patient contacted office on 12/09/23 noting low HR at night and in AM making him feel weak and dizzy. Diltiazem  dose lowered to 120 mg daily by Dr. Schilling. Patient is on Xarelto  20 mg daily for a CHADS2VASC score of 3.  On evaluation today, he is currently in NSR. Patient tells me that he has been taking diltiazem  180 mg once daily and diltiazem  30 mg TID during the period noting the lower HR. He was not taking diltiazem  30 mg PRN but regularly TID. Review of watch data shows he has primarily HR 30-40 at bedtime but also in evening time while he is awake. He states sometimes it makes him dizzy and he has to lay his head back to feel improvement. He has not yet begun the diltiazem  120 mg daily sent by Dr. Schilling. He has noted increased palpitations over the past few weeks.   On follow up 07/03/24, patient is currently in NSR. Patient contacted office on 7/22 noting increase in Afib episodes.   Today, he denies symptoms of chest pain, shortness of breath, orthopnea, PND, lower extremity edema, dizziness, presyncope, syncope, snoring, daytime somnolence, bleeding, or neurologic sequela. The patient is tolerating medications without difficulties and is otherwise without complaint today.   he has a BMI of Body mass index is 31.29 kg/m.SABRA Filed Weights   07/03/24 0923  Weight: 107.6 kg     Current Outpatient Medications  Medication Sig Dispense Refill   alfuzosin  (UROXATRAL ) 10 MG 24 hr tablet TAKE 1 TABLET BY MOUTH EVERY MORNING WITH BREAKFAST 90 tablet 1    amLODipine  (NORVASC ) 5 MG tablet Take 5 mg by mouth daily.     bevacizumab  (AVASTIN ) 2.5 mg/0.1 mL SOLN intravitreal injection 0.625 mg by Intravitreal route every 30 (thirty) days.     ciprofloxacin (CILOXAN) 0.3 % ophthalmic solution Insert 1 drop in the right eye 4 times daily x 2 days, this is after his Avastin  injection     diltiazem  (CARDIZEM ) 30 MG tablet Take 1 tablet (30 mg total) by mouth every 8 (eight) hours as needed. 90 tablet 11   losartan -hydrochlorothiazide  (HYZAAR) 100-25 MG tablet Take 1 tablet by mouth daily. 90 tablet 1   Polyethyl Glyc-Propyl Glyc PF (SYSTANE PRESERVATIVE FREE) 0.4-0.3 % SOLN Apply 1 drop to eye. (Patient taking differently: Place 2 drops into both eyes every morning.)     potassium chloride  (KLOR-CON  M) 10 MEQ tablet Take 1 tablet (10 mEq total) by mouth 2 (two) times daily. 180 tablet 1   rivaroxaban  (XARELTO ) 20 MG TABS tablet Take 1 tablet (20 mg total) by mouth daily with supper. 90 tablet 3   senna-docusate (SENOKOT-S) 8.6-50 MG tablet Take 1 tablet by mouth as needed for mild constipation.     Vitamin D, Ergocalciferol, 50000 units CAPS Take 50,000 Units by mouth every 30 (thirty) days.     No current facility-administered medications for this encounter.    Atrial Fibrillation Management history:  Previous antiarrhythmic drugs: none Previous cardioversions: none Previous ablations: none Anticoagulation history: Xarelto  20 mg daily.   ROS- All systems are reviewed and negative except  as per the HPI above.  Physical Exam: BP 130/76   Pulse 68   Ht 6' 1 (1.854 m)   Wt 107.6 kg   BMI 31.29 kg/m   GEN- The patient is well appearing, alert and oriented x 3 today.   Neck - no JVD or carotid bruit noted Lungs- Clear to ausculation bilaterally, normal work of breathing Heart- Regular rate and rhythm, no murmurs, rubs or gallops, PMI not laterally displaced Extremities- no clubbing, cyanosis, or edema Skin - no rash or ecchymosis  noted   EKG today demonstrates  Vent. rate 68 BPM PR interval 166 ms QRS duration 86 ms QT/QTcB 398/423 ms P-R-T axes 70 -63 30 Sinus rhythm with Blocked Premature atrial complexes Left anterior fascicular block Abnormal ECG When compared with ECG of 29-Apr-2024 17:11, PREVIOUS ECG IS PRESENT  Cardiac monitor 11-10/2023: Predominant rhythm is normal sinus.   PAF occurred 3% of the time.  The average ventricular rate was well controlled.   Echo 04/21/23 demonstrated   1. Left ventricular ejection fraction, by estimation, is 65 to 70%. The  left ventricle has normal function. The left ventricle has no regional  wall motion abnormalities. There is mild concentric left ventricular  hypertrophy. Left ventricular diastolic  parameters are consistent with Grade I diastolic dysfunction (impaired  relaxation).   2. Right ventricular systolic function is normal. The right ventricular  size is normal. There is mildly elevated pulmonary artery systolic  pressure.   3. The mitral valve is normal in structure. Trivial mitral valve  regurgitation.   4. Tricuspid valve regurgitation is mild to moderate.   5. The aortic valve is tricuspid. There is mild calcification of the  aortic valve. There is mild thickening of the aortic valve. Aortic valve  regurgitation is not visualized. Aortic valve sclerosis/calcification is  present, without any evidence of  aortic stenosis.   6. Aortic dilatation noted. There is mild dilatation of the ascending  aorta, measuring 43 mm.   7. The inferior vena cava is normal in size with greater than 50%  respiratory variability, suggesting right atrial pressure of 3 mmHg.    ASSESSMENT & PLAN CHA2DS2-VASc Score = 3  The patient's score is based upon: CHF History: 0 HTN History: 1 Diabetes History: 0 Stroke History: 0 Vascular Disease History: 0 Age Score: 2 Gender Score: 0       ASSESSMENT AND PLAN: Paroxysmal Atrial Fibrillation (ICD10:   I48.0) The patient's CHA2DS2-VASc score is 3, indicating a 3.2% annual risk of stroke.    He is currently in NSR. He appears to be intolerant of metoprolol  historically and had diltiazem  transitioned to amlodipine  (he also previously noted adverse effect from diltiazem ) for better BP management. We had another lengthy discussion about medication treatments and ablation in detail. We discussed potential options such as Multaq, flecainide, Tikosyn, and amiodarone. We talked about the monitoring required for these medications, hospital admission for Tikosyn, and potential adverse effects. We also discussed pulse field ablation as an option in the form of intervention. After discussion, patient is interested in speaking with EP regarding potential for Afib ablation. He declines AAD therapy at this time.    Secondary Hypercoagulable State (ICD10:  D68.69) The patient is at significant risk for stroke/thromboembolism based upon his CHA2DS2-VASc Score of 3.  Continue Rivaroxaban  (Xarelto ).  No missed doses.    Will help establish with EP to discuss ablation.    Terra Pac, PA-C  Afib Clinic [facility] [address] [place],  [place] [address] [phone]

## 2024-07-04 ENCOUNTER — Encounter (INDEPENDENT_AMBULATORY_CARE_PROVIDER_SITE_OTHER): Admitting: Ophthalmology

## 2024-07-04 DIAGNOSIS — I10 Essential (primary) hypertension: Secondary | ICD-10-CM | POA: Diagnosis not present

## 2024-07-04 DIAGNOSIS — H353211 Exudative age-related macular degeneration, right eye, with active choroidal neovascularization: Secondary | ICD-10-CM

## 2024-07-04 DIAGNOSIS — H43813 Vitreous degeneration, bilateral: Secondary | ICD-10-CM | POA: Diagnosis not present

## 2024-07-04 DIAGNOSIS — H35033 Hypertensive retinopathy, bilateral: Secondary | ICD-10-CM | POA: Diagnosis not present

## 2024-07-04 DIAGNOSIS — H353122 Nonexudative age-related macular degeneration, left eye, intermediate dry stage: Secondary | ICD-10-CM

## 2024-07-04 DIAGNOSIS — H2513 Age-related nuclear cataract, bilateral: Secondary | ICD-10-CM

## 2024-07-05 ENCOUNTER — Other Ambulatory Visit: Payer: Self-pay

## 2024-07-05 DIAGNOSIS — I10 Essential (primary) hypertension: Secondary | ICD-10-CM

## 2024-07-05 MED ORDER — LOSARTAN POTASSIUM-HCTZ 100-25 MG PO TABS: 1.0000 | ORAL_TABLET | Freq: Every day | ORAL | 1 refills | Status: DC

## 2024-07-23 ENCOUNTER — Inpatient Hospital Stay

## 2024-07-23 ENCOUNTER — Inpatient Hospital Stay: Attending: Hematology and Oncology | Admitting: Hematology and Oncology

## 2024-07-23 ENCOUNTER — Encounter: Payer: Self-pay | Admitting: Hematology and Oncology

## 2024-07-23 VITALS — BP 148/79 | HR 53 | Temp 98.8°F | Resp 18 | Ht 73.0 in | Wt 239.4 lb

## 2024-07-23 DIAGNOSIS — Z9049 Acquired absence of other specified parts of digestive tract: Secondary | ICD-10-CM | POA: Diagnosis not present

## 2024-07-23 DIAGNOSIS — Z7901 Long term (current) use of anticoagulants: Secondary | ICD-10-CM | POA: Insufficient documentation

## 2024-07-23 DIAGNOSIS — I4891 Unspecified atrial fibrillation: Secondary | ICD-10-CM | POA: Diagnosis not present

## 2024-07-23 DIAGNOSIS — Z8249 Family history of ischemic heart disease and other diseases of the circulatory system: Secondary | ICD-10-CM | POA: Insufficient documentation

## 2024-07-23 DIAGNOSIS — N189 Chronic kidney disease, unspecified: Secondary | ICD-10-CM | POA: Diagnosis not present

## 2024-07-23 DIAGNOSIS — Z79899 Other long term (current) drug therapy: Secondary | ICD-10-CM | POA: Diagnosis not present

## 2024-07-23 DIAGNOSIS — Z823 Family history of stroke: Secondary | ICD-10-CM | POA: Insufficient documentation

## 2024-07-23 DIAGNOSIS — Z888 Allergy status to other drugs, medicaments and biological substances status: Secondary | ICD-10-CM | POA: Insufficient documentation

## 2024-07-23 DIAGNOSIS — D509 Iron deficiency anemia, unspecified: Secondary | ICD-10-CM | POA: Insufficient documentation

## 2024-07-23 DIAGNOSIS — Z885 Allergy status to narcotic agent status: Secondary | ICD-10-CM | POA: Diagnosis not present

## 2024-07-23 DIAGNOSIS — Z8546 Personal history of malignant neoplasm of prostate: Secondary | ICD-10-CM | POA: Insufficient documentation

## 2024-07-23 DIAGNOSIS — Z882 Allergy status to sulfonamides status: Secondary | ICD-10-CM | POA: Diagnosis not present

## 2024-07-23 DIAGNOSIS — Z860101 Personal history of adenomatous and serrated colon polyps: Secondary | ICD-10-CM | POA: Diagnosis not present

## 2024-07-23 DIAGNOSIS — I129 Hypertensive chronic kidney disease with stage 1 through stage 4 chronic kidney disease, or unspecified chronic kidney disease: Secondary | ICD-10-CM | POA: Insufficient documentation

## 2024-07-23 NOTE — Assessment & Plan Note (Addendum)
 I have reviewed his electronic records and CBC results The combination of microcytosis with elevated RBC count suggest he has thalassemia until proven otherwise He has normal iron studies He was told that he might have sickle cell trait as the patient was informed of that diagnosis in [place] more than 40 years ago Without hemoglobin electrophoresis, I would not be able to tell the specific type that he might have He has no signs of hemolysis He does not have splenomegaly on CT imaging of the abdomen or MRI of the abdomen  In any case, ordering hemoglobin electrophoresis will not benefit the patient as he is completely asymptomatic and there is no specific treatment for this He has only 1 daughter and his daughter is currently not married Potentially, his condition could be inheritable but if his daughter want to be tested, that could be done separately After long discussion, he is in agreement not to get additional labs He does not need long-term follow-up

## 2024-07-23 NOTE — Progress Notes (Unsigned)
 Electrophysiology Office Note:   Date:  07/24/2024  ID:  Jeffrey, Perry January 24, 1946, MRN [id number]  Primary Cardiologist: Lynwood Schilling, MD Electrophysiologist: Fonda Kitty, MD      History of Present Illness:   Jeffrey Perry is a 78 y.o. male with h/o HTN, mild dilatation of aorta, tricuspid valve insufficiency, CKD, and paroxysmal atrial fibrillation who is being seen today for evaluation for catheter ablation at the request of Jeffrey Perry, Jeffrey Perry.   Discussed the use of AI scribe software for clinical note transcription with the patient, who gave verbal consent to proceed.  History of Present Illness He has experienced approximately 30 episodes of atrial fibrillation since June, as documented by his watch.  Episodes are overall relatively short. During these episodes, he feels weak and needs to sit down. Some episodes occur during sleep, and he becomes aware of them upon waking, often alerted by his Apple Watch.  He has a history of hypertension and was previously on once daily long-acting diltiazem , which was reduced to 30 mg as needed due to low heart rate and replaced by amlodipine . He is currently on Xarelto  for atrial fibrillation. He notes that his heart rate has been both too low and sometimes too high.  His atrial fibrillation episodes started after receiving the third Pfizer COVID-19 vaccine. He did not experience any episodes during a nine-week stay in [place], but they resumed upon returning to the [place] .  He reports snoring but has had a sleep study that was normal. He is concerned about the unpredictability of his episodes, fearing collapse without warning.  He maintains an active lifestyle, walking four miles every morning. He is concerned about the impact of atrial fibrillation on his mobility and daily activities.   Review of systems complete and found to be negative unless listed in HPI.   EP Information / Studies Reviewed:    EKG is not ordered today. EKG from  07/03/24 reviewed which showed sinus rhythm with frequent PACs.      Cardiac Monitor 11/2023:    Echo 03/2023:   1. Left ventricular ejection fraction, by estimation, is 65 to 70%. The  left ventricle has normal function. The left ventricle has no regional  wall motion abnormalities. There is mild concentric left ventricular  hypertrophy. Left ventricular diastolic  parameters are consistent with Grade I diastolic dysfunction (impaired  relaxation).   2. Right ventricular systolic function is normal. The right ventricular  size is normal. There is mildly elevated pulmonary artery systolic  pressure.   3. The mitral valve is normal in structure. Trivial mitral valve  regurgitation.   4. Tricuspid valve regurgitation is mild to moderate.   5. The aortic valve is tricuspid. There is mild calcification of the  aortic valve. There is mild thickening of the aortic valve. Aortic valve  regurgitation is not visualized. Aortic valve sclerosis/calcification is  present, without any evidence of  aortic stenosis.   6. Aortic dilatation noted. There is mild dilatation of the ascending  aorta, measuring 43 mm.   7. The inferior vena cava is normal in size with greater than 50%  respiratory variability, suggesting right atrial pressure of 3 mmHg.  Risk Assessment/Calculations:    CHA2DS2-VASc Score = 3   This indicates a 3.2% annual risk of stroke. The patient's score is based upon: CHF History: 0 HTN History: 1 Diabetes History: 0 Stroke History: 0 Vascular Disease History: 0 Age Score: 2 Gender Score: 0  Physical Exam:   VS:  BP 134/81   Pulse (!) 59   Ht 6' 1 (1.854 m)   Wt 235 lb (106.6 kg)   SpO2 97%   BMI 31.00 kg/m    Wt Readings from Last 3 Encounters:  07/24/24 235 lb (106.6 kg)  07/23/24 239 lb 6.4 oz (108.6 kg)  07/03/24 237 lb 3.2 oz (107.6 kg)     GEN: Well nourished, well developed in no acute distress NECK: No JVD CARDIAC: Bradycardic,  regular RESPIRATORY:  Clear to auscultation without rales, wheezing or rhonchi  ABDOMEN: Soft, non-distended EXTREMITIES:  No edema; No deformity   ASSESSMENT AND PLAN:    #Paroxysmal atrial fibrillation, symptomatic:  -Discussed treatment options today for AF including antiarrhythmic drug therapy and ablation. Discussed risks, recovery and likelihood of success with each treatment strategy. Risk, benefits, and alternatives to EP study and ablation for afib were discussed. These risks include but are not limited to stroke, bleeding, vascular damage, tamponade, perforation, damage to the esophagus, lungs, phrenic nerve and other structures, pulmonary vein stenosis, worsening renal function, coronary vasospasm and death.  Discussed potential need for repeat ablation procedures and antiarrhythmic drugs after an initial ablation. The patient understands these risk and wishes to think about his options.  - If he chooses to pursue antiarrhythmic drug therapy, then recommend nuclear stress and if no scar, start flecainide.  If he chooses to pursue catheter ablation, then arrange at next available date.  He will not need a CT scan. - He is not on standing rate control due to resting bradycardia.  Continue diltiazem  30 mg as needed.  #Hypercoagulable state due to AF: - Continue Xarelto  20 mg daily.  #Hypertension At goal today.  Recommend checking blood pressures 1-2 times per week at home and recording the values.  Recommend bringing these recordings to the primary care physician.   Follow up with Dr. Kennyth in 3 months  Signed, Fonda Kennyth, MD

## 2024-07-23 NOTE — Progress Notes (Signed)
 [facility] CONSULT NOTE  Patient Care Team: Levora Reyes SAUNDERS, MD as PCP - General (Family Medicine) Lavona Agent, MD as PCP - Cardiology (Cardiology) Kennyth Chew, MD as PCP - Electrophysiology (Cardiology) Livingston Rigg, MD as Consulting Physician (Dermatology)  Microcytic anemia I have reviewed his electronic records and CBC results The combination of microcytosis with elevated RBC count suggest he has thalassemia until proven otherwise He has normal iron studies He was told that he might have sickle cell trait as the patient was informed of that diagnosis in [place] more than 40 years ago Without hemoglobin electrophoresis, I would not be able to tell the specific type that he might have He has no signs of hemolysis He does not have splenomegaly on CT imaging of the abdomen or MRI of the abdomen  In any case, ordering hemoglobin electrophoresis will not benefit the patient as he is completely asymptomatic and there is no specific treatment for this He has only 1 daughter and his daughter is currently not married Potentially, his condition could be inheritable but if his daughter want to be tested, that could be done separately After long discussion, he is in agreement not to get additional labs He does not need long-term follow-up  CHIEF COMPLAINTS/PURPOSE OF CONSULTATION:  Chronic microcytic anemia, suspect thalassemia trait  HISTORY OF PRESENTING ILLNESS:  Jeffrey Perry 78 y.o. male is here because of chronic microcytic anemia He is originally from [place] Around 1980, he was told by a physician in [place] that he might have sickle cell trait We do not have any records related to the diagnosis He has never been sick with sickle cell anemia symptoms He has never needed transfusion support I have reviewed his electronic records dated back to 2011 The patient has chronic elevated RBC count with borderline anemia and low MCV The lowest blood count was  around 2017 but the patient has received treatment for prostate cancer He has never needed transfusion support His brothers and nieces have normal blood count He has 1 biological daughter and he is not aware if his daughter would have inheritable hemoglobinopathy  MEDICAL HISTORY:  Past Medical History:  Diagnosis Date   Atrial fibrillation (HCC)    Cataract    SMALL ONE IN ONE EYE LEFT   Chronic kidney disease    STAGE 2   Heart murmur    Hx of adenomatous polyp of colon 03/31/2023   Hypertension    Paget disease of bone    Questionable diagnosis.    Prostate cancer (HCC)    Sickle cell trait (HCC)    Tricuspid regurgitation     SURGICAL HISTORY: Past Surgical History:  Procedure Laterality Date   BLADDER SURGERY     CHOLECYSTECTOMY  02/02/2023   COLONOSCOPY     FINGER GANGLION CYST EXCISION  2011   3rd finger right hand   HYDROCELE EXCISION  2012   PENILE PROSTHESIS IMPLANT     TRIGGER FINGER RELEASE Left 08/2021   thumb    SOCIAL HISTORY: Social History   Socioeconomic History   Marital status: Married    Spouse name: Not on file   Number of children: Not on file   Years of education: Not on file   Highest education level: Doctorate  Occupational History   Occupation: professor    Employer: [employer]  Tobacco Use   Smoking status: Never    Passive exposure: Never   Smokeless tobacco: Never  Vaping Use   Vaping status: Never Used  Substance and Sexual Activity   Alcohol use: Yes    Comment: Beer occ   Drug use: No   Sexual activity: Yes    Partners: Female  Other Topics Concern   Not on file  Social History Narrative   Not on file   Social Drivers of Health   Financial Resource Strain: Low Risk  (12/18/2023)   Received from [facility]   Overall Financial Resource Strain (CARDIA)    Difficulty of Paying Living Expenses: Not hard at all  Food Insecurity: No Food Insecurity (12/18/2023)   Received from [facility]    Hunger Vital Sign    Within the past 12 months, you worried that your food would run out before you got the money to buy more.: Never true    Within the past 12 months, the food you bought just didn't last and you didn't have money to get more.: Never true  Transportation Needs: Unmet Transportation Needs (12/18/2023)   Received from [facility] - Transportation    In the past 12 months, has lack of transportation kept you from medical appointments or from getting medications?: Yes    Lack of Transportation (Non-Medical): No  Physical Activity: Sufficiently Active (09/15/2023)   Exercise Vital Sign    Days of Exercise per Week: 3 days    Minutes of Exercise per Session: 60 min  Stress: No Stress Concern Present (09/15/2023)   [employer] of Occupational Health - Occupational Stress Questionnaire    Feeling of Stress : Not at all  Social Connections: Moderately Integrated (09/15/2023)   Social Connection and Isolation Panel    Frequency of Communication with Friends and Family: More than three times a week    Frequency of Social Gatherings with Friends and Family: More than three times a week    Attends Religious Services: 1 to 4 times per year    Active Member of [employer] or Organizations: Not on file    Attends Banker Meetings: Never    Marital Status: Married  Catering manager Violence: Not At Risk (09/15/2023)   Humiliation, Afraid, Rape, and Kick questionnaire    Fear of Current or Ex-Partner: No    Emotionally Abused: No    Physically Abused: No    Sexually Abused: No    FAMILY HISTORY: Family History  Problem Relation Age of Onset   Hypertension Mother    Hypertension Sister    Stroke Sister    Hypertension Sister    Hypertension Brother    Hypertension Brother    Colon cancer Neg Hx    Colon polyps Neg Hx    Crohn's disease Neg Hx    Esophageal cancer Neg Hx    Rectal cancer Neg Hx    Stomach cancer Neg Hx     Ulcerative colitis Neg Hx     ALLERGIES:  is allergic to latex, lisinopril, neuromuscular blocking agents, oxycodone, phenazopyridine hcl, pyridium [phenazopyridine hcl], and sulfonamide derivatives.  MEDICATIONS:  Current Outpatient Medications  Medication Sig Dispense Refill   alfuzosin  (UROXATRAL ) 10 MG 24 hr tablet TAKE 1 TABLET BY MOUTH EVERY MORNING WITH BREAKFAST 90 tablet 1   amLODipine  (NORVASC ) 5 MG tablet Take 5 mg by mouth daily.     bevacizumab  (AVASTIN ) 2.5 mg/0.1 mL SOLN intravitreal injection 0.625 mg by Intravitreal route every 30 (thirty) days.     ciprofloxacin (CILOXAN) 0.3 % ophthalmic solution Insert 1 drop in the right eye 4 times daily  x 2 days, this is after his Avastin  injection     diltiazem  (CARDIZEM ) 30 MG tablet Take 1 tablet (30 mg total) by mouth every 8 (eight) hours as needed. 90 tablet 11   losartan -hydrochlorothiazide  (HYZAAR) 100-25 MG tablet Take 1 tablet by mouth daily. 90 tablet 1   Polyethyl Glyc-Propyl Glyc PF (SYSTANE PRESERVATIVE FREE) 0.4-0.3 % SOLN Apply 1 drop to eye. (Patient taking differently: Place 2 drops into both eyes every morning.)     potassium chloride  (KLOR-CON  M) 10 MEQ tablet Take 1 tablet (10 mEq total) by mouth 2 (two) times daily. 180 tablet 1   rivaroxaban  (XARELTO ) 20 MG TABS tablet Take 1 tablet (20 mg total) by mouth daily with supper. 90 tablet 3   senna-docusate (SENOKOT-S) 8.6-50 MG tablet Take 1 tablet by mouth as needed for mild constipation.     Vitamin D, Ergocalciferol, 50000 units CAPS Take 50,000 Units by mouth every 30 (thirty) days.     No current facility-administered medications for this visit.    REVIEW OF SYSTEMS:   Constitutional: Denies fevers, chills or abnormal night sweats Eyes: Denies blurriness of vision, double vision or watery eyes Ears, nose, mouth, throat, and face: Denies mucositis or sore throat Respiratory: Denies cough, dyspnea or wheezes Cardiovascular: Denies palpitation, chest discomfort  or lower extremity swelling Gastrointestinal:  Denies nausea, heartburn or change in bowel habits Skin: Denies abnormal skin rashes Lymphatics: Denies new lymphadenopathy or easy bruising Neurological:Denies numbness, tingling or new weaknesses Behavioral/Psych: Mood is stable, no new changes  All other systems were reviewed with the patient and are negative.  PHYSICAL EXAMINATION: ECOG PERFORMANCE STATUS: 0 - Asymptomatic  Vitals:   07/23/24 1409  BP: (!) 148/79  Pulse: (!) 53  Resp: 18  Temp: 98.8 F (37.1 C)  SpO2: 100%   Filed Weights   07/23/24 1409  Weight: 239 lb 6.4 oz (108.6 kg)    GENERAL:alert, no distress and comfortable  LABORATORY DATA:  I have reviewed the data as listed Lab Results  Component Value Date   WBC 4.4 06/07/2024   HGB 12.2 (L) 06/07/2024   HCT 38.7 (L) 06/07/2024   MCV 70.1 (L) 06/07/2024   PLT 177.0 06/07/2024   Recent Labs    11/28/23 1123 12/23/23 1138 04/29/24 1719  NA 134* 140 136  K 4.1 3.9 3.8  CL 99 103 102  CO2 28 29 26   GLUCOSE 92 87 89  BUN 15 16 13   CREATININE 1.44* 1.29 1.12  CALCIUM 9.1 9.5 9.2  GFRNONAA  --   --  >60  PROT 7.0  --  7.4  ALBUMIN  --   --  3.8  AST 20  --  22  ALT 23  --  19  ALKPHOS  --   --  70  BILITOT 0.6  --  0.9    RADIOGRAPHIC STUDIES: I have personally reviewed the radiological images as listed and agreed with the findings in the report.

## 2024-07-24 ENCOUNTER — Encounter: Payer: Self-pay | Admitting: Cardiology

## 2024-07-24 ENCOUNTER — Ambulatory Visit: Attending: Cardiology | Admitting: Cardiology

## 2024-07-24 VITALS — BP 134/81 | HR 59 | Ht 73.0 in | Wt 235.0 lb

## 2024-07-24 DIAGNOSIS — D6869 Other thrombophilia: Secondary | ICD-10-CM | POA: Diagnosis not present

## 2024-07-24 DIAGNOSIS — I480 Paroxysmal atrial fibrillation: Secondary | ICD-10-CM

## 2024-07-24 DIAGNOSIS — I10 Essential (primary) hypertension: Secondary | ICD-10-CM | POA: Diagnosis not present

## 2024-07-24 NOTE — Patient Instructions (Signed)
 Medication Instructions:  Your physician recommends that you continue on your current medications as directed. Please refer to the Current Medication list given to you today.  *If you need a refill on your cardiac medications before your next appointment, please call your pharmacy*  Testing/Procedures: Ablation Your physician has recommended that you have an ablation. Catheter ablation is a medical procedure used to treat some cardiac arrhythmias (irregular heartbeats). During catheter ablation, a long, thin, flexible tube is put into a blood vessel in your groin (upper thigh), or neck. This tube is called an ablation catheter. It is then guided to your heart through the blood vessel. Radio frequency waves destroy small areas of heart tissue where abnormal heartbeats may cause an arrhythmia to start.  Follow-Up: At [facility], you and your health needs are our priority.  As part of our continuing mission to provide you with exceptional heart care, our providers are all part of one team.  This team includes your primary Cardiologist (physician) and Advanced Practice Providers or APPs (Physician Assistants and Nurse Practitioners) who all work together to provide you with the care you need, when you need it.  Your next appointment:   Call us  if you would like to proceed with scheduling an ablation at [phone]

## 2024-07-27 ENCOUNTER — Telehealth: Payer: Self-pay | Admitting: Cardiology

## 2024-07-27 ENCOUNTER — Encounter: Payer: Self-pay | Admitting: Cardiology

## 2024-07-27 DIAGNOSIS — I4891 Unspecified atrial fibrillation: Secondary | ICD-10-CM

## 2024-07-27 NOTE — Telephone Encounter (Signed)
 Pt called in stating he would like to have an ablation scheduled. Please advise.

## 2024-08-01 ENCOUNTER — Encounter (INDEPENDENT_AMBULATORY_CARE_PROVIDER_SITE_OTHER): Admitting: Ophthalmology

## 2024-08-01 DIAGNOSIS — I10 Essential (primary) hypertension: Secondary | ICD-10-CM | POA: Diagnosis not present

## 2024-08-01 DIAGNOSIS — H353211 Exudative age-related macular degeneration, right eye, with active choroidal neovascularization: Secondary | ICD-10-CM

## 2024-08-01 DIAGNOSIS — H353122 Nonexudative age-related macular degeneration, left eye, intermediate dry stage: Secondary | ICD-10-CM

## 2024-08-01 DIAGNOSIS — H35033 Hypertensive retinopathy, bilateral: Secondary | ICD-10-CM | POA: Diagnosis not present

## 2024-08-01 DIAGNOSIS — H43813 Vitreous degeneration, bilateral: Secondary | ICD-10-CM | POA: Diagnosis not present

## 2024-08-03 NOTE — Addendum Note (Signed)
 Addended by: Lashia Niese on: 08/03/2024 10:13 AM   Modules accepted: Orders

## 2024-08-03 NOTE — Telephone Encounter (Signed)
 LM for pt to call back - Also sent MyChart message.

## 2024-08-03 NOTE — Telephone Encounter (Signed)
 Pt is scheduled for Afib Ablation with Dr. Kennyth on 11/20 at 7:30 am.

## 2024-08-03 NOTE — Telephone Encounter (Signed)
 Pt trying to reach April G to sch ablation

## 2024-08-29 ENCOUNTER — Encounter (INDEPENDENT_AMBULATORY_CARE_PROVIDER_SITE_OTHER): Admitting: Ophthalmology

## 2024-08-29 DIAGNOSIS — H43813 Vitreous degeneration, bilateral: Secondary | ICD-10-CM

## 2024-08-29 DIAGNOSIS — I10 Essential (primary) hypertension: Secondary | ICD-10-CM

## 2024-08-29 DIAGNOSIS — H353211 Exudative age-related macular degeneration, right eye, with active choroidal neovascularization: Secondary | ICD-10-CM

## 2024-08-29 DIAGNOSIS — H353122 Nonexudative age-related macular degeneration, left eye, intermediate dry stage: Secondary | ICD-10-CM | POA: Diagnosis not present

## 2024-08-29 DIAGNOSIS — H35033 Hypertensive retinopathy, bilateral: Secondary | ICD-10-CM

## 2024-09-28 ENCOUNTER — Telehealth (HOSPITAL_COMMUNITY): Payer: Self-pay

## 2024-09-28 ENCOUNTER — Encounter (HOSPITAL_COMMUNITY): Payer: Self-pay

## 2024-09-28 DIAGNOSIS — I4891 Unspecified atrial fibrillation: Secondary | ICD-10-CM | POA: Diagnosis not present

## 2024-09-28 NOTE — Telephone Encounter (Signed)
 Spoke with patient to complete pre-procedure call.     Health status review:  Any new medical conditions, recent signs of acute illness or been started on antibiotics? No Any recent hospitalizations or surgeries? No Any new medications started since pre-op visit? No  Follow all medication instructions prior to procedure or the procedure may be rescheduled:    Continue taking Xarelto  (Rivaroxaban ) once daily without missing any doses before procedure. Essential chronic medications:  No medication should be continued, unless told otherwise. On the morning of your procedure DO NOT take any medication., including Xarelto  (Rivaroxaban ).  Nothing to eat or drink after midnight prior to your procedure.  Pre-procedure testing scheduled: CT not needed. lab work by November 6.  Confirmed patient is scheduled for Atrial Fibrillation Ablation on Thursday, November 20 with Dr. Dr. Kennyth. Instructed patient to arrive at the Main Entrance A at [facility]: [address] [place], [place] [address] and check in at Admitting at 5:30 AM.  Plan to go home the same day, you will only stay overnight if medically necessary. You MUST have a responsible adult to drive you home and MUST be with you the first 24 hours after you arrive home or your procedure could be cancelled.  Informed a nurse may call a day before the procedure to confirm arrival time and ensure instructions are followed.  Patient verbalized understanding to information provided and is agreeable to proceed with procedure.   Advised to contact RN Navigator at [phone], to inform of any new medications started after call or concerns prior to procedure.

## 2024-09-29 LAB — CBC
Hematocrit: 40.5 % (ref 37.5–51.0)
Hemoglobin: 12.4 g/dL — ABNORMAL LOW (ref 13.0–17.7)
MCH: 22.6 pg — ABNORMAL LOW (ref 26.6–33.0)
MCHC: 30.6 g/dL — ABNORMAL LOW (ref 31.5–35.7)
MCV: 74 fL — ABNORMAL LOW (ref 79–97)
Platelets: 181 x10E3/uL (ref 150–450)
RBC: 5.49 x10E6/uL (ref 4.14–5.80)
RDW: 15.7 % — ABNORMAL HIGH (ref 11.6–15.4)
WBC: 5.2 x10E3/uL (ref 3.4–10.8)

## 2024-09-29 LAB — BASIC METABOLIC PANEL WITH GFR
BUN/Creatinine Ratio: 14 (ref 10–24)
BUN: 17 mg/dL (ref 8–27)
CO2: 22 mmol/L (ref 20–29)
Calcium: 9.2 mg/dL (ref 8.6–10.2)
Chloride: 101 mmol/L (ref 96–106)
Creatinine, Ser: 1.22 mg/dL (ref 0.76–1.27)
Glucose: 55 mg/dL — AB (ref 70–99)
Potassium: 3.4 mmol/L — AB (ref 3.5–5.2)
Sodium: 139 mmol/L (ref 134–144)
eGFR: 61 mL/min/1.73 (ref >59)

## 2024-09-30 ENCOUNTER — Ambulatory Visit: Payer: Self-pay | Admitting: Cardiology

## 2024-10-03 ENCOUNTER — Encounter (INDEPENDENT_AMBULATORY_CARE_PROVIDER_SITE_OTHER): Admitting: Ophthalmology

## 2024-10-03 DIAGNOSIS — H35033 Hypertensive retinopathy, bilateral: Secondary | ICD-10-CM

## 2024-10-03 DIAGNOSIS — H353211 Exudative age-related macular degeneration, right eye, with active choroidal neovascularization: Secondary | ICD-10-CM

## 2024-10-03 DIAGNOSIS — H353122 Nonexudative age-related macular degeneration, left eye, intermediate dry stage: Secondary | ICD-10-CM

## 2024-10-03 DIAGNOSIS — H43813 Vitreous degeneration, bilateral: Secondary | ICD-10-CM | POA: Diagnosis not present

## 2024-10-03 DIAGNOSIS — I10 Essential (primary) hypertension: Secondary | ICD-10-CM

## 2024-10-11 DIAGNOSIS — N183 Chronic kidney disease, stage 3 unspecified: Secondary | ICD-10-CM | POA: Diagnosis not present

## 2024-10-12 LAB — LAB REPORT - SCANNED
Albumin, Urine POC: <3
Albumin/Creatinine Ratio, Urine, POC: <5
Creatinine, POC: 64.6 mg/dL
EGFR: 55

## 2024-10-15 DIAGNOSIS — E876 Hypokalemia: Secondary | ICD-10-CM | POA: Diagnosis not present

## 2024-10-15 DIAGNOSIS — N400 Enlarged prostate without lower urinary tract symptoms: Secondary | ICD-10-CM | POA: Diagnosis not present

## 2024-10-15 DIAGNOSIS — D6869 Other thrombophilia: Secondary | ICD-10-CM | POA: Diagnosis not present

## 2024-10-15 DIAGNOSIS — E669 Obesity, unspecified: Secondary | ICD-10-CM | POA: Diagnosis not present

## 2024-10-15 DIAGNOSIS — N1831 Chronic kidney disease, stage 3a: Secondary | ICD-10-CM | POA: Diagnosis not present

## 2024-10-15 DIAGNOSIS — I129 Hypertensive chronic kidney disease with stage 1 through stage 4 chronic kidney disease, or unspecified chronic kidney disease: Secondary | ICD-10-CM | POA: Diagnosis not present

## 2024-10-15 DIAGNOSIS — I4891 Unspecified atrial fibrillation: Secondary | ICD-10-CM | POA: Diagnosis not present

## 2024-10-15 DIAGNOSIS — G3184 Mild cognitive impairment, so stated: Secondary | ICD-10-CM | POA: Diagnosis not present

## 2024-10-15 DIAGNOSIS — Z7901 Long term (current) use of anticoagulants: Secondary | ICD-10-CM | POA: Diagnosis not present

## 2024-10-17 ENCOUNTER — Ambulatory Visit: Payer: Medicare PPO | Admitting: *Deleted

## 2024-10-17 VITALS — Ht 73.0 in | Wt 235.0 lb

## 2024-10-17 DIAGNOSIS — Z0000 Encounter for general adult medical examination without abnormal findings: Secondary | ICD-10-CM

## 2024-10-17 NOTE — Progress Notes (Addendum)
 Chief Complaint  Patient presents with   Medicare Wellness    Mr. Cannedy,  Thank you for taking the time for your Medicare Wellness Visit. I appreciate your continued commitment to your health goals. Please review the care plan we discussed, and feel free to reach out if I can assist you further.  Please note that Annual Wellness Visits do not include a physical exam. Some assessments may be limited, especially if the visit was conducted virtually. If needed, we may recommend an in-person follow-up with your provider.  Ongoing Care Seeing your primary care provider every 3 to 6 months helps us  monitor your health and provide consistent, personalized care.   Referrals If a referral was made during today's visit and you haven't received any updates within two weeks, please contact the referred provider directly to check on the status.  Recommended Screenings:  Health Maintenance  Topic Date Due   Zoster (Shingles) Vaccine (1 of 2) 01/17/1967   COVID-19 Vaccine (4 - 2025-26 season) 11/02/2024*   Medicare Annual Wellness Visit  10/17/2025   DTaP/Tdap/Td vaccine (4 - Td or Tdap) 03/24/2028   Pneumococcal Vaccine for age over 61  Completed   Hepatitis C Screening  Completed   Meningitis B Vaccine  Aged Out   Flu Shot  Discontinued   Hepatitis B Vaccine  Discontinued   Colon Cancer Screening  Discontinued  *Topic was postponed. The date shown is not the original due date.       10/13/2024    8:56 AM  Advanced Directives  Does Patient Have a Medical Advance Directive? Yes  Type of Advance Directive Healthcare Power of Attorney  Copy of Healthcare Power of Attorney in Chart? No - copy requested    Vision: Annual vision screenings are recommended for early detection of glaucoma, cataracts, and diabetic retinopathy. These exams can also reveal signs of chronic conditions such as diabetes and high blood pressure.  Dental: Annual dental screenings help detect early signs of oral  cancer, gum disease, and other conditions linked to overall health, including heart disease and diabetes.  Please see the attached documents for additional preventive care recommendations.               Subjective:   Jeffrey Perry is a 78 y.o. male who presents for a Medicare Annual Wellness Visit.  Allergies (verified) Latex, Lisinopril, Neuromuscular blocking agents, Oxycodone, Phenazopyridine hcl, Pyridium [phenazopyridine hcl], and Sulfonamide derivatives   History: Past Medical History:  Diagnosis Date   Atrial fibrillation (HCC)    Cataract    SMALL ONE IN ONE EYE LEFT   Chronic kidney disease    STAGE 2   Heart murmur    Hx of adenomatous polyp of colon 03/31/2023   Hypertension    Paget disease of bone    Questionable diagnosis.    Prostate cancer (HCC)    Sickle cell trait    Tricuspid regurgitation    Past Surgical History:  Procedure Laterality Date   BLADDER SURGERY     CHOLECYSTECTOMY  02/02/2023   COLONOSCOPY     FINGER GANGLION CYST EXCISION  2011   3rd finger right hand   HYDROCELE EXCISION  2012   PENILE PROSTHESIS IMPLANT     TRIGGER FINGER RELEASE Left 08/2021   thumb   Family History  Problem Relation Age of Onset   Hypertension Mother    Hypertension Sister    Stroke Sister    Hypertension Sister    Hypertension Brother  Hypertension Brother    Colon cancer Neg Hx    Colon polyps Neg Hx    Crohn's disease Neg Hx    Esophageal cancer Neg Hx    Rectal cancer Neg Hx    Stomach cancer Neg Hx    Ulcerative colitis Neg Hx    Social History   Occupational History   Occupation: professor    Associate Professor: [employer]  Tobacco Use   Smoking status: Never    Passive exposure: Never   Smokeless tobacco: Never  Vaping Use   Vaping status: Never Used  Substance and Sexual Activity   Alcohol use: Yes    Comment: Beer occ   Drug use: No   Sexual activity: Yes    Partners: Female   Tobacco Counseling Counseling given: Not Answered  SDOH  Screenings   Food Insecurity: No Food Insecurity (10/17/2024)  Housing: Low Risk  (10/17/2024)  Transportation Needs: No Transportation Needs (10/17/2024)  Utilities: Not At Risk (10/17/2024)  Alcohol Screen: Low Risk  (09/15/2023)  Depression (PHQ2-9): Low Risk  (10/17/2024)  Financial Resource Strain: Low Risk  (10/13/2024)  Physical Activity: Sufficiently Active (10/17/2024)  Social Connections: Moderately Integrated (10/17/2024)  Stress: No Stress Concern Present (10/17/2024)  Tobacco Use: Low Risk  (10/17/2024)  Health Literacy: Adequate Health Literacy (10/17/2024)   See flowsheets for full screening details  Depression Screen PHQ 2 & 9 Depression Scale- Over the past 2 weeks, how often have you been bothered by any of the following problems? Little interest or pleasure in doing things: 0 Feeling down, depressed, or hopeless (PHQ Adolescent also includes...irritable): 0 PHQ-2 Total Score: 0 Trouble falling or staying asleep, or sleeping too much: 2 Feeling tired or having little energy: 0 Poor appetite or overeating (PHQ Adolescent also includes...weight loss): 0 Feeling bad about yourself - or that you are a failure or have let yourself or your family down: 0 Trouble concentrating on things, such as reading the newspaper or watching television (PHQ Adolescent also includes...like school work): 0 Moving or speaking so slowly that other people could have noticed. Or the opposite - being so fidgety or restless that you have been moving around a lot more than usual: 0 Thoughts that you would be better off dead, or of hurting yourself in some way: 0 PHQ-9 Total Score: 2 If you checked off any problems, how difficult have these problems made it for you to do your work, take care of things at home, or get along with other people?: Not difficult at all     Goals Addressed             This Visit's Progress    Patient Stated   On track    Continue current lifestyle     Patient  Stated       Continue current lifestyle       Visit info / Clinical Intake: Medicare Wellness Visit Type:: Subsequent Annual Wellness Visit Persons participating in visit:: patient Medicare Wellness Visit Mode:: Telephone If telephone:: video declined Because this visit was a virtual/telehealth visit:: vitals recorded from last visit If Telephone or Video please confirm:: I connected with the patient using audio enabled telemedicine application and verified that I am speaking with the correct person using two identifiers; I discussed the limitations of evaluation and management by telemedicine; The patient expressed understanding and agreed to proceed Patient Location:: home Provider Location:: home Information given by:: patient Interpreter Needed?: No Pre-visit prep was completed: no AWV questionnaire completed by patient prior to  visit?: yes Date:: 10/13/24 Living arrangements:: lives with spouse/significant other Patient's Overall Health Status Rating: good Typical amount of pain: some Does pain affect daily life?: no Are you currently prescribed opioids?: no  Dietary Habits and Nutritional Risks How many meals a day?: 3 Eats fruit and vegetables daily?: yes Most meals are obtained by: preparing own meals In the last 2 weeks, have you had any of the following?: none Diabetic:: no  Functional Status Activities of Daily Living (to include ambulation/medication): (Patient-Rptd) Independent Ambulation: (Patient-Rptd) Independent Medication Administration: Independent Home Management: (Patient-Rptd) Independent Manage your own finances?: (!) no Primary transportation is: driving Concerns about vision?: no *vision screening is required for WTM* Concerns about hearing?: no  Fall Screening Falls in the past year?: (Patient-Rptd) 0 Number of falls in past year: 0 Was there an injury with Fall?: 0 Fall Risk Category Calculator: 0 Patient Fall Risk Level: Low Fall  Risk  Fall Risk Patient at Risk for Falls Due to: No Fall Risks Fall risk Follow up: Falls evaluation completed; Education provided; Falls prevention discussed  Home and Transportation Safety: All rugs have non-skid backing?: yes All stairs or steps have railings?: yes Grab bars in the bathtub or shower?: (!) no Have non-skid surface in bathtub or shower?: yes Good home lighting?: yes Regular seat belt use?: (!) no Hospital stays in the last year:: (!) yes How many hospital stays:: 1 Reason: [facility]  afib  Cognitive Assessment Difficulty concentrating, remembering, or making decisions? : no Will 6CIT or Mini Cog be Completed: yes What year is it?: 0 points What month is it?: 0 points Give patient an address phrase to remember (5 components): Its very sunny outside today in November About what time is it?: 0 points Count backwards from 20 to 1: 0 points Say the months of the year in reverse: 0 points Repeat the address phrase from earlier: 0 points 6 CIT Score: 0 points  Advance Directives (For Healthcare) Does Patient Have a Medical Advance Directive?: Yes Type of Advance Directive: Healthcare Power of Attorney Copy of Healthcare Power of Attorney in Chart?: No - copy requested  Reviewed/Updated  Reviewed/Updated: Reviewed All (Medical, Surgical, Family, Medications, Allergies, Care Teams, Patient Goals); Surgical History; Family History; Medications; Allergies; Care Teams; Patient Goals; Medical History        Objective:    Today's Vitals   10/17/24 0938  Weight: 235 lb (106.6 kg)  Height: 6' 1 (1.854 m)   Body mass index is 31 kg/m.  Current Medications (verified) Outpatient Encounter Medications as of 10/17/2024  Medication Sig   alfuzosin  (UROXATRAL ) 10 MG 24 hr tablet TAKE 1 TABLET BY MOUTH EVERY MORNING WITH BREAKFAST (Patient taking differently: Take 10 mg by mouth every evening. TAKE 1 TABLET BY MOUTH EVERY MORNING WITH BREAKFAST)   amLODipine  (NORVASC ) 5  MG tablet Take 5 mg by mouth daily.   bevacizumab  (AVASTIN ) 2.5 mg/0.1 mL SOLN intravitreal injection 0.625 mg by Intravitreal route every 30 (thirty) days.   ciprofloxacin (CILOXAN) 0.3 % ophthalmic solution Insert 1 drop in the right eye 4 times daily x 2 days, this is after his Avastin  injection   diltiazem  (CARDIZEM ) 30 MG tablet Take 1 tablet (30 mg total) by mouth every 8 (eight) hours as needed.   losartan -hydrochlorothiazide  (HYZAAR) 100-25 MG tablet Take 1 tablet by mouth daily.   Polyethyl Glyc-Propyl Glyc PF (SYSTANE PRESERVATIVE FREE) 0.4-0.3 % SOLN Apply 1 drop to eye.   potassium chloride  (KLOR-CON  M) 10 MEQ tablet Take 1 tablet (10  mEq total) by mouth 2 (two) times daily.   rivaroxaban  (XARELTO ) 20 MG TABS tablet Take 1 tablet (20 mg total) by mouth daily with supper.   senna-docusate (SENOKOT-S) 8.6-50 MG tablet Take 1 tablet by mouth as needed for mild constipation.   Vitamin D, Ergocalciferol, 50000 units CAPS Take 50,000 Units by mouth every 30 (thirty) days.   No facility-administered encounter medications on file as of 10/17/2024.   Hearing/Vision screen Hearing Screening - Comments:: No trouble hearing Vision Screening - Comments:: Gould/mathews Up to date Immunizations and Health Maintenance Health Maintenance  Topic Date Due   Zoster Vaccines- Shingrix (1 of 2) 01/17/1967   COVID-19 Vaccine (4 - 2025-26 season) 11/02/2024 (Originally 07/30/2024)   Medicare Annual Wellness (AWV)  10/17/2025   DTaP/Tdap/Td (4 - Td or Tdap) 03/24/2028   Pneumococcal Vaccine: 50+ Years  Completed   Hepatitis C Screening  Completed   Meningococcal B Vaccine  Aged Out   Influenza Vaccine  Discontinued   Hepatitis B Vaccines 19-59 Average Risk  Discontinued   Colonoscopy  Discontinued        Assessment/Plan:  This is a routine wellness examination for [place].  Patient Care Team: Levora Reyes SAUNDERS, MD as PCP - General (Family Medicine) Lavona Agent, MD as PCP - Cardiology  (Cardiology) Kennyth Chew, MD as PCP - Electrophysiology (Cardiology) Livingston Rigg, MD as Consulting Physician (Dermatology)  I have personally reviewed and noted the following in the patient's chart:   Medical and social history Use of alcohol, tobacco or illicit drugs  Current medications and supplements including opioid prescriptions. Functional ability and status Nutritional status Physical activity Advanced directives List of other physicians Hospitalizations, surgeries, and ER visits in previous 12 months Vitals Screenings to include cognitive, depression, and falls Referrals and appointments  No orders of the defined types were placed in this encounter.  In addition, I have reviewed and discussed with patient certain preventive protocols, quality metrics, and best practice recommendations. A written personalized care plan for preventive services as well as general preventive health recommendations were provided to patient.   Mliss Graff, LPN   [id number]/[id number]   Return in 1 year (on 10/17/2025).  After Visit Summary: (MyChart) Due to this being a telephonic visit, the after visit summary with patients personalized plan was offered to patient via MyChart   Nurse Notes:

## 2024-10-17 NOTE — Pre-Procedure Instructions (Signed)
 Instructed patient on the following items: Arrival time 0515 Nothing to eat or drink after midnight No meds AM of procedure Responsible person to drive you home and stay with you for 24 hrs  Have you missed any doses of anti-coagulant Xarelto - takes once a day, hasn't missed any in last 4 weeks.

## 2024-10-17 NOTE — Patient Instructions (Signed)
 Mr. Jeffrey Perry,  Thank you for taking the time for your Medicare Wellness Visit. I appreciate your continued commitment to your health goals. Please review the care plan we discussed, and feel free to reach out if I can assist you further.  Please note that Annual Wellness Visits do not include a physical exam. Some assessments may be limited, especially if the visit was conducted virtually. If needed, we may recommend an in-person follow-up with your provider.  Ongoing Care Seeing your primary care provider every 3 to 6 months helps us  monitor your health and provide consistent, personalized care.   Referrals If a referral was made during today's visit and you haven't received any updates within two weeks, please contact the referred provider directly to check on the status.  Recommended Screenings:  Health Maintenance  Topic Date Due   Zoster (Shingles) Vaccine (1 of 2) 01/17/1965   COVID-19 Vaccine (4 - 2025-26 season) 11/02/2024*   Medicare Annual Wellness Visit  10/17/2025   DTaP/Tdap/Td vaccine (4 - Td or Tdap) 03/24/2028   Pneumococcal Vaccine for age over 20  Completed   Hepatitis C Screening  Completed   Meningitis B Vaccine  Aged Out   Flu Shot  Discontinued   Hepatitis B Vaccine  Discontinued   Colon Cancer Screening  Discontinued  *Topic was postponed. The date shown is not the original due date.       10/13/2024    8:56 AM  Advanced Directives  Does Patient Have a Medical Advance Directive? Yes  Type of Advance Directive Healthcare Power of Attorney  Copy of Healthcare Power of Attorney in Chart? No - copy requested    Vision: Annual vision screenings are recommended for early detection of glaucoma, cataracts, and diabetic retinopathy. These exams can also reveal signs of chronic conditions such as diabetes and high blood pressure.  Dental: Annual dental screenings help detect early signs of oral cancer, gum disease, and other conditions linked to overall health,  including heart disease and diabetes.  Please see the attached documents for additional preventive care recommendations.       Mr. Jeffrey Perry , Thank you for taking time to come for your Medicare Wellness Visit. I appreciate your ongoing commitment to your health goals. Please review the following plan we discussed and let me know if I can assist you in the future.   Screening recommendations/referrals: Colonoscopy:  Recommended yearly ophthalmology/optometry visit for glaucoma screening and checkup Recommended yearly dental visit for hygiene and checkup  Vaccinations: Influenza vaccine:  Pneumococcal vaccine:  Tdap vaccine:  Shingles vaccine:       Preventive Care 65 Years and Older, Male Preventive care refers to lifestyle choices and visits with your health care provider that can promote health and wellness. What does preventive care include? A yearly physical exam. This is also called an annual well check. Dental exams once or twice a year. Routine eye exams. Ask your health care provider how often you should have your eyes checked. Personal lifestyle choices, including: Daily care of your teeth and gums. Regular physical activity. Eating a healthy diet. Avoiding tobacco and drug use. Limiting alcohol use. Practicing safe sex. Taking low doses of aspirin every day. Taking vitamin and mineral supplements as recommended by your health care provider. What happens during an annual well check? The services and screenings done by your health care provider during your annual well check will depend on your age, overall health, lifestyle risk factors, and family history of disease. Counseling  Your  health care provider may ask you questions about your: Alcohol use. Tobacco use. Drug use. Emotional well-being. Home and relationship well-being. Sexual activity. Eating habits. History of falls. Memory and ability to understand (cognition). Work and work astronomer. Screening   You may have the following tests or measurements: Height, weight, and BMI. Blood pressure. Lipid and cholesterol levels. These may be checked every 5 years, or more frequently if you are over 83 years old. Skin check. Lung cancer screening. You may have this screening every year starting at age 69 if you have a 30-pack-year history of smoking and currently smoke or have quit within the past 15 years. Fecal occult blood test (FOBT) of the stool. You may have this test every year starting at age 86. Flexible sigmoidoscopy or colonoscopy. You may have a sigmoidoscopy every 5 years or a colonoscopy every 10 years starting at age 35. Prostate cancer screening. Recommendations will vary depending on your family history and other risks. Hepatitis C blood test. Hepatitis B blood test. Sexually transmitted disease (STD) testing. Diabetes screening. This is done by checking your blood sugar (glucose) after you have not eaten for a while (fasting). You may have this done every 1-3 years. Abdominal aortic aneurysm (AAA) screening. You may need this if you are a current or former smoker. Osteoporosis. You may be screened starting at age 65 if you are at high risk. Talk with your health care provider about your test results, treatment options, and if necessary, the need for more tests. Vaccines  Your health care provider may recommend certain vaccines, such as: Influenza vaccine. This is recommended every year. Tetanus, diphtheria, and acellular pertussis (Tdap, Td) vaccine. You may need a Td booster every 10 years. Zoster vaccine. You may need this after age 75. Pneumococcal 13-valent conjugate (PCV13) vaccine. One dose is recommended after age 32. Pneumococcal polysaccharide (PPSV23) vaccine. One dose is recommended after age 34. Talk to your health care provider about which screenings and vaccines you need and how often you need them. This information is not intended to replace advice given to you by  your health care provider. Make sure you discuss any questions you have with your health care provider. Document Released: 12/12/2015 Document Revised: 08/04/2016 Document Reviewed: 09/16/2015 Elsevier Interactive Patient Education  2017 [employer].  Fall Prevention in the Home Falls can cause injuries. They can happen to people of all ages. There are many things you can do to make your home safe and to help prevent falls. What can I do on the outside of my home? Regularly fix the edges of walkways and driveways and fix any cracks. Remove anything that might make you trip as you walk through a door, such as a raised step or threshold. Trim any bushes or trees on the path to your home. Use bright outdoor lighting. Clear any walking paths of anything that might make someone trip, such as rocks or tools. Regularly check to see if handrails are loose or broken. Make sure that both sides of any steps have handrails. Any raised decks and porches should have guardrails on the edges. Have any leaves, snow, or ice cleared regularly. Use sand or salt on walking paths during winter. Clean up any spills in your garage right away. This includes oil or grease spills. What can I do in the bathroom? Use night lights. Install grab bars by the toilet and in the tub and shower. Do not use towel bars as grab bars. Use non-skid mats or decals in  the tub or shower. If you need to sit down in the shower, use a plastic, non-slip stool. Keep the floor dry. Clean up any water that spills on the floor as soon as it happens. Remove soap buildup in the tub or shower regularly. Attach bath mats securely with double-sided non-slip rug tape. Do not have throw rugs and other things on the floor that can make you trip. What can I do in the bedroom? Use night lights. Make sure that you have a light by your bed that is easy to reach. Do not use any sheets or blankets that are too big for your bed. They should not hang  down onto the floor. Have a firm chair that has side arms. You can use this for support while you get dressed. Do not have throw rugs and other things on the floor that can make you trip. What can I do in the kitchen? Clean up any spills right away. Avoid walking on wet floors. Keep items that you use a lot in easy-to-reach places. If you need to reach something above you, use a strong step stool that has a grab bar. Keep electrical cords out of the way. Do not use floor polish or wax that makes floors slippery. If you must use wax, use non-skid floor wax. Do not have throw rugs and other things on the floor that can make you trip. What can I do with my stairs? Do not leave any items on the stairs. Make sure that there are handrails on both sides of the stairs and use them. Fix handrails that are broken or loose. Make sure that handrails are as long as the stairways. Check any carpeting to make sure that it is firmly attached to the stairs. Fix any carpet that is loose or worn. Avoid having throw rugs at the top or bottom of the stairs. If you do have throw rugs, attach them to the floor with carpet tape. Make sure that you have a light switch at the top of the stairs and the bottom of the stairs. If you do not have them, ask someone to add them for you. What else can I do to help prevent falls? Wear shoes that: Do not have high heels. Have rubber bottoms. Are comfortable and fit you well. Are closed at the toe. Do not wear sandals. If you use a stepladder: Make sure that it is fully opened. Do not climb a closed stepladder. Make sure that both sides of the stepladder are locked into place. Ask someone to hold it for you, if possible. Clearly mark and make sure that you can see: Any grab bars or handrails. First and last steps. Where the edge of each step is. Use tools that help you move around (mobility aids) if they are needed. These  include: Canes. Walkers. Scooters. Crutches. Turn on the lights when you go into a dark area. Replace any light bulbs as soon as they burn out. Set up your furniture so you have a clear path. Avoid moving your furniture around. If any of your floors are uneven, fix them. If there are any pets around you, be aware of where they are. Review your medicines with your doctor. Some medicines can make you feel dizzy. This can increase your chance of falling. Ask your doctor what other things that you can do to help prevent falls. This information is not intended to replace advice given to you by your health care provider. Make sure you discuss  any questions you have with your health care provider. Document Released: 09/11/2009 Document Revised: 04/22/2016 Document Reviewed: 12/20/2014 Elsevier Interactive Patient Education  2017 [employer].

## 2024-10-18 ENCOUNTER — Ambulatory Visit (HOSPITAL_COMMUNITY): Admitting: Certified Registered Nurse Anesthetist

## 2024-10-18 ENCOUNTER — Ambulatory Visit (HOSPITAL_COMMUNITY)
Admission: RE | Disposition: A | Discharge status: Home / Self Care | Payer: Self-pay | Source: Home / Self Care | Attending: Cardiology

## 2024-10-18 ENCOUNTER — Ambulatory Visit (HOSPITAL_COMMUNITY)
Admission: RE | Admit: 2024-10-18 | Discharge: 2024-10-18 | Disposition: A | Discharge status: Home / Self Care | Attending: Cardiology | Admitting: Cardiology

## 2024-10-18 DIAGNOSIS — N189 Chronic kidney disease, unspecified: Secondary | ICD-10-CM | POA: Insufficient documentation

## 2024-10-18 DIAGNOSIS — I480 Paroxysmal atrial fibrillation: Secondary | ICD-10-CM

## 2024-10-18 DIAGNOSIS — E1122 Type 2 diabetes mellitus with diabetic chronic kidney disease: Secondary | ICD-10-CM | POA: Diagnosis not present

## 2024-10-18 DIAGNOSIS — I129 Hypertensive chronic kidney disease with stage 1 through stage 4 chronic kidney disease, or unspecified chronic kidney disease: Secondary | ICD-10-CM | POA: Insufficient documentation

## 2024-10-18 DIAGNOSIS — Z7901 Long term (current) use of anticoagulants: Secondary | ICD-10-CM | POA: Diagnosis not present

## 2024-10-18 DIAGNOSIS — N182 Chronic kidney disease, stage 2 (mild): Secondary | ICD-10-CM

## 2024-10-18 DIAGNOSIS — D6869 Other thrombophilia: Secondary | ICD-10-CM | POA: Insufficient documentation

## 2024-10-18 DIAGNOSIS — Z79899 Other long term (current) drug therapy: Secondary | ICD-10-CM | POA: Diagnosis not present

## 2024-10-18 DIAGNOSIS — I77810 Thoracic aortic ectasia: Secondary | ICD-10-CM | POA: Insufficient documentation

## 2024-10-18 HISTORY — PX: ATRIAL FIBRILLATION ABLATION: EP1191

## 2024-10-18 LAB — BASIC METABOLIC PANEL WITH GFR
Anion gap: 12 (ref 5–15)
BUN: 20 mg/dL (ref 8–23)
CO2: 24 mmol/L (ref 22–32)
Calcium: 8.7 mg/dL — ABNORMAL LOW (ref 8.9–10.3)
Chloride: 101 mmol/L (ref 98–111)
Creatinine, Ser: 1.38 mg/dL — ABNORMAL HIGH (ref 0.61–1.24)
GFR, Estimated: 53 mL/min — ABNORMAL LOW (ref >60)
Glucose, Bld: 98 mg/dL (ref 70–99)
Potassium: 3.6 mmol/L (ref 3.5–5.1)
Sodium: 137 mmol/L (ref 135–145)

## 2024-10-18 LAB — POCT ACTIVATED CLOTTING TIME: Activated Clotting Time: 308 s

## 2024-10-18 SURGERY — ATRIAL FIBRILLATION ABLATION
Anesthesia: General

## 2024-10-18 MED ORDER — CEFAZOLIN SODIUM-DEXTROSE 2-4 GM/100ML-% IV SOLN
INTRAVENOUS | Status: AC
  Filled 2024-10-18: qty 100

## 2024-10-18 MED ORDER — DEXAMETHASONE SOD PHOSPHATE PF 10 MG/ML IJ SOLN
INTRAMUSCULAR | Status: DC | PRN
  Administered 2024-10-18: 10 mg via INTRAVENOUS

## 2024-10-18 MED ORDER — FENTANYL CITRATE (PF) 100 MCG/2ML IJ SOLN
INTRAMUSCULAR | Status: AC
  Filled 2024-10-18: qty 2

## 2024-10-18 MED ORDER — EPHEDRINE SULFATE-NACL 50-0.9 MG/10ML-% IV SOSY
PREFILLED_SYRINGE | INTRAVENOUS | Status: DC | PRN
Start: 2024-10-18 — End: 2024-10-18
  Administered 2024-10-18: 5 mg via INTRAVENOUS

## 2024-10-18 MED ORDER — SODIUM CHLORIDE 0.9% FLUSH: 3.0000 mL | INTRAVENOUS | Status: DC | PRN

## 2024-10-18 MED ORDER — FENTANYL CITRATE (PF) 250 MCG/5ML IJ SOLN
INTRAMUSCULAR | Status: DC | PRN
  Administered 2024-10-18 (×2): 50 ug via INTRAVENOUS

## 2024-10-18 MED ORDER — ACETAMINOPHEN 325 MG PO TABS: 650.0000 mg | ORAL_TABLET | ORAL | Status: DC | PRN

## 2024-10-18 MED ORDER — LIDOCAINE 2% (20 MG/ML) 5 ML SYRINGE
INTRAMUSCULAR | Status: DC | PRN
  Administered 2024-10-18: 100 mg via INTRAVENOUS

## 2024-10-18 MED ORDER — ATROPINE SULFATE 1 MG/10ML IJ SOSY
PREFILLED_SYRINGE | INTRAMUSCULAR | Status: AC
  Filled 2024-10-18: qty 10

## 2024-10-18 MED ORDER — ROCURONIUM BROMIDE 10 MG/ML (PF) SYRINGE
PREFILLED_SYRINGE | INTRAVENOUS | Status: DC | PRN
  Administered 2024-10-18: 100 mg via INTRAVENOUS

## 2024-10-18 MED ORDER — SODIUM CHLORIDE 0.9 % IV SOLN
250.0000 mL | INTRAVENOUS | Status: DC | PRN
Start: 2024-10-18 — End: 2024-10-18

## 2024-10-18 MED ORDER — ONDANSETRON HCL 4 MG/2ML IJ SOLN: 4.0000 mg | Freq: Four times a day (QID) | INTRAMUSCULAR | Status: DC | PRN

## 2024-10-18 MED ORDER — PROPOFOL 10 MG/ML IV BOLUS
INTRAVENOUS | Status: DC | PRN
  Administered 2024-10-18: 150 mg via INTRAVENOUS

## 2024-10-18 MED ORDER — SUGAMMADEX SODIUM 200 MG/2ML IV SOLN
INTRAVENOUS | Status: DC | PRN
  Administered 2024-10-18: 400 mg via INTRAVENOUS

## 2024-10-18 MED ORDER — SODIUM CHLORIDE 0.9 % IV SOLN: INTRAVENOUS | Status: DC

## 2024-10-18 MED ORDER — PROTAMINE SULFATE 10 MG/ML IV SOLN
INTRAVENOUS | Status: DC | PRN
  Administered 2024-10-18: 35 mg via INTRAVENOUS

## 2024-10-18 MED ORDER — HEPARIN SODIUM (PORCINE) 1000 UNIT/ML IJ SOLN
INTRAMUSCULAR | Status: DC | PRN
  Administered 2024-10-18: 16000 [IU] via INTRAVENOUS
  Administered 2024-10-18: 1000 [IU] via INTRAVENOUS

## 2024-10-18 MED ORDER — ATROPINE SULFATE 1 MG/ML IV SOLN
INTRAVENOUS | Status: DC | PRN
  Administered 2024-10-18: 1 mg via INTRAVENOUS

## 2024-10-18 MED ORDER — RIVAROXABAN 20 MG PO TABS
20.0000 mg | ORAL_TABLET | Freq: Once | ORAL | Status: AC
  Administered 2024-10-18: 20 mg via ORAL
  Filled 2024-10-18: qty 1

## 2024-10-18 MED ORDER — SODIUM CHLORIDE 0.9% FLUSH: 3.0000 mL | Freq: Two times a day (BID) | INTRAVENOUS | Status: DC

## 2024-10-18 MED ORDER — CEFAZOLIN SODIUM-DEXTROSE 2-4 GM/100ML-% IV SOLN
2.0000 g | Freq: Once | INTRAVENOUS | Status: AC
  Administered 2024-10-18: 2 g via INTRAVENOUS

## 2024-10-18 MED ORDER — PHENYLEPHRINE HCL-NACL 20-0.9 MG/250ML-% IV SOLN
INTRAVENOUS | Status: DC | PRN
  Administered 2024-10-18: 40 ug/min via INTRAVENOUS

## 2024-10-18 MED ORDER — ONDANSETRON HCL 4 MG/2ML IJ SOLN
INTRAMUSCULAR | Status: DC | PRN
  Administered 2024-10-18: 4 mg via INTRAVENOUS

## 2024-10-18 MED ORDER — HEPARIN (PORCINE) IN NACL 1000-0.9 UT/500ML-% IV SOLN
INTRAVENOUS | Status: DC | PRN
  Administered 2024-10-18: 500 mL

## 2024-10-18 SURGICAL SUPPLY — 17 items
BLANKET WARM UNDERBOD FULL ACC (MISCELLANEOUS) ×2 IMPLANT
CABLE FARASTAR GEN2 SNGL USE (CABLE) IMPLANT
CATH ACUNAV GE 8F-90 (CATHETERS) IMPLANT
CATH BI DIR 7FR CS F-J 10 PIN (CATHETERS) IMPLANT
CATH FARAWAVE NAV 31 (CATHETERS) IMPLANT
CLOSURE PERCLOSE PROSTYLE (Vascular Products) IMPLANT
COVER SWIFTLINK CONNECTOR (BAG) ×2 IMPLANT
DILATOR VESSEL 38 20CM 16FR (INTRODUCER) IMPLANT
GUIDEWIRE INQWIRE 1.5J.035X260 (WIRE) IMPLANT
KIT PATCH RHYTHMIA HDX (MISCELLANEOUS) IMPLANT
KIT VERSACROSS CNCT FARADRIVE (KITS) IMPLANT
PACK EP LF (CUSTOM PROCEDURE TRAY) ×2 IMPLANT
PAD DEFIB RADIO PHYSIO CONN (PAD) ×2 IMPLANT
SHEATH FARADRIVE STEERABLE (SHEATH) IMPLANT
SHEATH PINNACLE 8F 10CM (SHEATH) IMPLANT
SHEATH PINNACLE 9F 10CM (SHEATH) IMPLANT
SHEATH PROBE COVER 6X72 (BAG) IMPLANT

## 2024-10-18 NOTE — H&P (Signed)
 Electrophysiology Note:   Date:  10/18/24  ID:  Jeffrey Perry, DOB [birth year], MRN [id number]   Primary Cardiologist: Lynwood Schilling, MD Electrophysiologist: Fonda Kitty, MD       History of Present Illness:   Jeffrey Perry is a 78 y.o. male with h/o HTN, mild dilatation of aorta, tricuspid valve insufficiency, CKD, and paroxysmal atrial fibrillation who is being seen today for evaluation for catheter ablation at the request of Fairy Heinrich, [place].    Discussed the use of AI scribe software for clinical note transcription with the patient, who gave verbal consent to proceed.   History of Present Illness He has experienced approximately 30 episodes of atrial fibrillation since June, as documented by his watch.  Episodes are overall relatively short. During these episodes, he feels weak and needs to sit down. Some episodes occur during sleep, and he becomes aware of them upon waking, often alerted by his Apple Watch.   He has a history of hypertension and was previously on once daily long-acting diltiazem , which was reduced to 30 mg as needed due to low heart rate and replaced by amlodipine . He is currently on Xarelto  for atrial fibrillation. He notes that his heart rate has been both too low and sometimes too high.   His atrial fibrillation episodes started after receiving the third Pfizer COVID-19 vaccine. He did not experience any episodes during a nine-week stay in [place], but they resumed upon returning to the [place] .   He reports snoring but has had a sleep study that was normal. He is concerned about the unpredictability of his episodes, fearing collapse without warning.   He maintains an active lifestyle, walking four miles every morning. He is concerned about the impact of atrial fibrillation on his mobility and daily activities.    Interval: Patient presents today for planned catheter ablation. Reports feeling relatively well. No new or acute complaints.   Review of systems  complete and found to be negative unless listed in HPI.    EP Information / Studies Reviewed:     EKG is not ordered today. EKG from 07/03/24 reviewed which showed sinus rhythm with frequent PACs.        Cardiac Monitor 11/2023:     Echo 03/2023:   1. Left ventricular ejection fraction, by estimation, is 65 to 70%. The  left ventricle has normal function. The left ventricle has no regional  wall motion abnormalities. There is mild concentric left ventricular  hypertrophy. Left ventricular diastolic  parameters are consistent with Grade I diastolic dysfunction (impaired  relaxation).   2. Right ventricular systolic function is normal. The right ventricular  size is normal. There is mildly elevated pulmonary artery systolic  pressure.   3. The mitral valve is normal in structure. Trivial mitral valve  regurgitation.   4. Tricuspid valve regurgitation is mild to moderate.   5. The aortic valve is tricuspid. There is mild calcification of the  aortic valve. There is mild thickening of the aortic valve. Aortic valve  regurgitation is not visualized. Aortic valve sclerosis/calcification is  present, without any evidence of  aortic stenosis.   6. Aortic dilatation noted. There is mild dilatation of the ascending  aorta, measuring 43 mm.   7. The inferior vena cava is normal in size with greater than 50%  respiratory variability, suggesting right atrial pressure of 3 mmHg.   Risk Assessment/Calculations:     CHA2DS2-VASc Score = 3   This indicates a 3.2% annual risk of stroke. The patient's  score is based upon: CHF History: 0 HTN History: 1 Diabetes History: 0 Stroke History: 0 Vascular Disease History: 0 Age Score: 2 Gender Score: 0               Physical Exam:    Today's Vitals   10/18/24 0554  BP: (!) 145/84  Pulse: 66  Resp: 16  Temp: 98.6 F (37 C)  TempSrc: Oral  SpO2: 99%  Weight: 106.6 kg  Height: 6' 1 (1.854 m)   Body mass index is 31 kg/m.  GEN:  Well nourished, well developed in no acute distress NECK: No JVD CARDIAC: Normal rate, regular RESPIRATORY:  Clear to auscultation without rales, wheezing or rhonchi  ABDOMEN: Soft, non-distended EXTREMITIES:  No edema; No deformity    ASSESSMENT AND PLAN:     #Paroxysmal atrial fibrillation, symptomatic:  -Discussed treatment options today for AF including antiarrhythmic drug therapy and ablation. Discussed risks, recovery and likelihood of success with each treatment strategy. Risk, benefits, and alternatives to EP study and ablation for afib were discussed. These risks include but are not limited to stroke, bleeding, vascular damage, tamponade, perforation, damage to the esophagus, lungs, phrenic nerve and other structures, pulmonary vein stenosis, worsening renal function, coronary vasospasm and death.  Discussed potential need for repeat ablation procedures and antiarrhythmic drugs after an initial ablation. The patient understands these risk and wishes to proceed today.   #Hypercoagulable state due to AF: - Continue Xarelto  20 mg daily.    Follow up with Dr. Kennyth in 3 months   Signed, Fonda Kennyth, MD

## 2024-10-18 NOTE — Progress Notes (Signed)
 Patient and wife was given discharge instructions. Both verbalized understanding.

## 2024-10-18 NOTE — Transfer of Care (Signed)
 Immediate Anesthesia Transfer of Care Note  [name]  Procedure(s) Performed: ATRIAL FIBRILLATION ABLATION  Patient Location: PACU and Cath Lab  Anesthesia Type:General  Level of Consciousness: awake and patient cooperative  Airway & Oxygen Therapy: Patient Spontanous Breathing and Patient connected to nasal cannula oxygen  Post-op Assessment: Report given to RN and Post -op Vital signs reviewed and stable  Post vital signs: Reviewed and stable  Last Vitals:  Vitals Value Taken Time  BP    Temp    Pulse 71 10/18/24 09:01  Resp 13 10/18/24 09:01  SpO2 99 % 10/18/24 09:01  Vitals shown include unfiled device data.  Last Pain:  Vitals:   10/18/24 0554  TempSrc: Oral         Complications: There were no known notable events for this encounter.

## 2024-10-18 NOTE — Anesthesia Procedure Notes (Signed)
 Procedure Name: Intubation Date/Time: 10/18/2024 7:33 AM  Performed by: Lyle Delon LABOR, CRNAPre-anesthesia Checklist: Patient identified, Emergency Drugs available, Suction available and Patient being monitored Patient Re-evaluated:Patient Re-evaluated prior to induction Oxygen Delivery Method: Circle system utilized Preoxygenation: Pre-oxygenation with 100% oxygen Induction Type: IV induction Ventilation: Mask ventilation without difficulty Laryngoscope Size: Mac Grade View: Grade I Tube type: Oral Tube size: 7.5 mm Number of attempts: 1 Airway Equipment and Method: Stylet and Oral airway Placement Confirmation: ETT inserted through vocal cords under direct vision, positive ETCO2 and breath sounds checked- equal and bilateral Secured at: 24 cm Tube secured with: Tape Dental Injury: Teeth and Oropharynx as per pre-operative assessment

## 2024-10-18 NOTE — Discharge Instructions (Signed)

## 2024-10-18 NOTE — Anesthesia Preprocedure Evaluation (Addendum)
 Anesthesia Evaluation  Patient identified by MRN, date of birth, ID band Patient awake    Reviewed: Allergy & Precautions, H&P , NPO status , Patient's Chart, lab work & pertinent test results  Airway Mallampati: II  TM Distance: >3 FB Neck ROM: Full    Dental  (+) Dental Advisory Given   Pulmonary neg pulmonary ROS   Pulmonary exam normal breath sounds clear to auscultation       Cardiovascular hypertension, Pt. on medications Normal cardiovascular exam+ dysrhythmias Atrial Fibrillation + Valvular Problems/Murmurs  Rhythm:Regular Rate:Normal     Neuro/Psych negative neurological ROS  negative psych ROS   GI/Hepatic negative GI ROS, Neg liver ROS,,,  Endo/Other  negative endocrine ROS    Renal/GU Renal InsufficiencyRenal disease  negative genitourinary   Musculoskeletal negative musculoskeletal ROS (+)    Abdominal  (+) + obese  Peds negative pediatric ROS (+)  Hematology negative hematology ROS (+)   Anesthesia Other Findings   Reproductive/Obstetrics negative OB ROS                              Anesthesia Physical Anesthesia Plan  ASA: 3  Anesthesia Plan: General   Post-op Pain Management:    Induction: Intravenous  PONV Risk Score and Plan: 2 and Treatment may vary due to age or medical condition, Ondansetron  and Midazolam  Airway Management Planned: Oral ETT  Additional Equipment:   Intra-op Plan:   Post-operative Plan: Extubation in OR  Informed Consent: I have reviewed the patients History and Physical, chart, labs and discussed the procedure including the risks, benefits and alternatives for the proposed anesthesia with the patient or authorized representative who has indicated his/her understanding and acceptance.     Dental advisory given  Plan Discussed with: CRNA  Anesthesia Plan Comments:         Anesthesia Quick Evaluation

## 2024-10-19 ENCOUNTER — Encounter (HOSPITAL_COMMUNITY): Payer: Self-pay | Admitting: Cardiology

## 2024-10-19 ENCOUNTER — Telehealth (HOSPITAL_COMMUNITY): Payer: Self-pay

## 2024-10-19 NOTE — Anesthesia Postprocedure Evaluation (Signed)
 Anesthesia Post Note  [name]  Procedure(s) Performed: ATRIAL FIBRILLATION ABLATION     Patient location during evaluation: PACU Anesthesia Type: General Level of consciousness: awake and alert Pain management: pain level controlled Vital Signs Assessment: post-procedure vital signs reviewed and stable Respiratory status: spontaneous breathing, nonlabored ventilation, respiratory function stable and patient connected to nasal cannula oxygen Cardiovascular status: blood pressure returned to baseline and stable Postop Assessment: no apparent nausea or vomiting Anesthetic complications: no   There were no known notable events for this encounter.  Last Vitals:  Vitals:   10/18/24 1130 10/18/24 1200  BP: 124/67 (!) 115/92  Pulse: 64 64  Resp: 13 12  Temp:    SpO2: 96% 95%    Last Pain:  Vitals:   10/18/24 0929  TempSrc: Axillary  PainSc: 0-No pain                 Epifanio Lamar BRAVO

## 2024-10-19 NOTE — Telephone Encounter (Signed)
 Spoke with patient to complete post procedure follow up call.  Patient reports no complications with groin sites.   Instructions reviewed with patient:  Remove large bandage at puncture site after 24 hours. It is normal to have bruising, tenderness, mild swelling, and a pea or marble sized lump/knot at the groin site which can take up to three months to resolve.  Get help right away if you notice sudden swelling at the puncture site.  Check your puncture site every day for signs of infection: fever, redness, swelling, pus drainage, warmth, foul odor or excessive pain. If this occurs, please call [phone], to speak with the RN Navigator. Get help right away if your puncture site is bleeding and the bleeding does not stop after applying firm pressure to the area.  You may continue to have skipped beats/ atrial fibrillation during the first several months after your procedure.  It is very important not to miss any doses of your blood thinner Xarelto .    You will follow up with the Afib clinic 4 weeks after your procedure and follow up with the APP 3 months after your procedure. Patient states he informed Dr. Kennyth that [place] will be [place] of the [place] from Jan- March 24th. He request the 3 month appointment to be moved to March 30. Message sent to EP scheduling.  Activity restrictions reviewed.  Patient verbalized understanding to all instructions provided.

## 2024-10-22 MED FILL — Fentanyl Citrate Preservative Free (PF) Inj 100 MCG/2ML: INTRAMUSCULAR | Qty: 2 | Status: AC

## 2024-10-22 MED FILL — Atropine Sulfate Soln Prefill Syr 1 MG/10ML (0.1 MG/ML): INTRAMUSCULAR | Qty: 10 | Status: AC

## 2024-10-25 ENCOUNTER — Other Ambulatory Visit: Payer: Self-pay | Admitting: Medical Genetics

## 2024-10-26 DIAGNOSIS — N3040 Irradiation cystitis without hematuria: Secondary | ICD-10-CM | POA: Diagnosis not present

## 2024-10-26 DIAGNOSIS — N183 Chronic kidney disease, stage 3 unspecified: Secondary | ICD-10-CM | POA: Diagnosis not present

## 2024-10-26 DIAGNOSIS — I129 Hypertensive chronic kidney disease with stage 1 through stage 4 chronic kidney disease, or unspecified chronic kidney disease: Secondary | ICD-10-CM | POA: Diagnosis not present

## 2024-10-26 DIAGNOSIS — E559 Vitamin D deficiency, unspecified: Secondary | ICD-10-CM | POA: Diagnosis not present

## 2024-10-31 ENCOUNTER — Encounter: Payer: Self-pay | Admitting: Family Medicine

## 2024-10-31 ENCOUNTER — Ambulatory Visit: Admitting: Family Medicine

## 2024-10-31 VITALS — BP 118/76 | HR 60 | Temp 98.3°F | Resp 12 | Ht 73.0 in | Wt 241.2 lb

## 2024-10-31 DIAGNOSIS — I480 Paroxysmal atrial fibrillation: Secondary | ICD-10-CM | POA: Diagnosis not present

## 2024-10-31 DIAGNOSIS — E876 Hypokalemia: Secondary | ICD-10-CM | POA: Diagnosis not present

## 2024-10-31 DIAGNOSIS — N183 Chronic kidney disease, stage 3 unspecified: Secondary | ICD-10-CM | POA: Diagnosis not present

## 2024-10-31 DIAGNOSIS — I10 Essential (primary) hypertension: Secondary | ICD-10-CM

## 2024-10-31 DIAGNOSIS — R4189 Other symptoms and signs involving cognitive functions and awareness: Secondary | ICD-10-CM

## 2024-10-31 DIAGNOSIS — C61 Malignant neoplasm of prostate: Secondary | ICD-10-CM

## 2024-10-31 NOTE — Progress Notes (Unsigned)
 Subjective:  Patient ID: Jeffrey Perry, male    DOB: 09-06-1946  Age: 78 y.o. MRN: [id number]  CC:  Chief Complaint  Patient presents with   Transitions Of Care    D/C 10/18/24. Patient had ablation. No questions or concerns.     HPI Jeffrey Perry presents for  Transition of care appointment/hospital follow-up.  I do not see a TOC phone call.  Paroxysmal atrial fibrillation Cardiologist Dr. Lavona, electrophysiologist Dr. Kennyth.  He was admitted 10/18/2024, for catheter ablation of atrial fibrillation.  He had been on diltiazem  30 mg as needed, previous extended release diltiazem  was held.  Dosage had been lowered due to low heart rate.   Xarelto  for anticoagulation.  Unpredictable episodes/flares.  Underwent catheter ablation, continued on Xarelto  20 mg daily with plan for follow-up with electrophysiology in 3 months.  No complications on follow-up phone call 11/21.  A-fib clinic follow-up in 4 weeks planned.  More service appointment planned for EP/Dr. Kennyth for follow-up.  Has been feeling fine. No bruising/bleeding. No heart palpitations. Still taking xarelto  - not needing diltiazem . Has not yet returned to walking. Has been cleared to walk on flat surface.  Prior daily walks, 1-1.5hours.   Hypertension: Has required adjustment of medications previously due to low heart rates.  Currently taking amlodipine  5 mg daily, losartan  hct 100/25mg  every day. diltiazem  30 mg as needed for palpitations.  Creatinine 1.38 on November 20.  This is up from previous 1.22 on October 31, 1.12 in June with eGFR greater than 60 in June. Hx of stage 3 ckd, nephrology eval 10/26/24. Stable, baseline creat 1.2-1.4. Home readings - none. No side effects with meds.  BP Readings from Last 3 Encounters:  10/31/24 118/76  10/18/24 (!) 115/92  07/24/24 134/81   Lab Results  Component Value Date   CREATININE 1.38 (H) 10/18/2024   Hypokalemia Most recent reading of 3.6 on November 20, previously 3.4 October  31.  Has been treated with potassium 10 mill equivalents twice daily. Still on same dose.   Prostate CA: Prior radiation tx in 2012, treated by Dr. Cam, then Dr. Presley at [facility]. Appt in April. Requests PSA level today. No new urinary symptoms.  PSA 10/13/23 at [facility] - 0.60.  Lab Results  Component Value Date   PSA1 0.5 02/07/2020   PSA 0.4 07/22/2016   PSA 0.51 01/21/2016   PSA 0.73 03/04/2014   Will be seeing rheumatology in January.   Had home visit from [facility]. Was advised to consider changing the losartan  hct to just losartan . Concern about effect on kidney? Has seen nephrology recently as above. Mentioned to nephrology - they did not not think change was necessary.  Also had some concern on memory - was told to consider a medication for memory? 2 meds were mentioned. No prior diagnosis of dementia. He denies any memory concerns.  He reportedly had trouble with a component of a phrase.   MOCA testing in office today - 25/30  missed recall- intact with cues. Otherwise normal.      10/13/2024    8:56 AM 09/20/2023    8:13 AM 09/15/2023    9:35 AM 02/07/2020    8:18 AM  6CIT Screen  What Year? 0 points 0 points 0 points 0 points  What month? 0 points 0 points 0 points 0 points  What time? 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 11/23/2023   Aortic root enlargement 03/24/2023   S/P laparoscopic cholecystectomy 02/02/2023   Tricuspid valve  insufficiency 09/25/2020   Palpitations 09/25/2020   CKD (chronic kidney disease), stage II 09/25/2020   Murmur 07/18/2017   Elevated LFTs    Abdominal pain 04/30/2016   Renal insufficiency 03/05/2013   HTN (hypertension) 03/06/2012   Past Medical History:  Diagnosis Date   Atrial fibrillation (HCC)    Cataract    SMALL ONE IN ONE EYE LEFT   Chronic kidney disease    STAGE 2   Heart murmur    Hx of adenomatous polyp of colon 03/31/2023   Hypertension    Paget disease of bone    Questionable diagnosis.    Prostate cancer (HCC)    Sickle cell trait    Tricuspid regurgitation    Past Surgical History:  Procedure Laterality Date   ATRIAL FIBRILLATION ABLATION [name] 10/18/2024   Procedure: ATRIAL FIBRILLATION ABLATION;  Surgeon: Kennyth Chew, MD;  Location: [facility] INVASIVE CV LAB;  Service: Cardiovascular;  Laterality: [name];   BLADDER SURGERY     CHOLECYSTECTOMY  02/02/2023   COLONOSCOPY     FINGER GANGLION CYST EXCISION  2011   3rd finger right hand   HYDROCELE EXCISION  2012   PENILE PROSTHESIS IMPLANT     TRIGGER FINGER RELEASE Left 08/2021   thumb   Allergies  Allergen Reactions   Latex Itching   Lisinopril Cough   Neuromuscular Blocking Agents Swelling    Ankle swelling Ankle swelling   Oxycodone Itching   Phenazopyridine Hcl Swelling    Ankle swelling   Pyridium [Phenazopyridine Hcl] Swelling    Ankle swelling   Sulfonamide Derivatives Itching   Prior to Admission medications   Medication Sig Start Date End Date Taking? Authorizing Provider  alfuzosin  (UROXATRAL ) 10 MG 24 hr tablet TAKE 1 TABLET BY MOUTH EVERY MORNING WITH BREAKFAST Patient taking differently: Take 10 mg by mouth every evening. TAKE 1 TABLET BY MOUTH EVERY MORNING WITH BREAKFAST 10/10/23  Yes Levora Jeffrey SAUNDERS, MD  amLODipine  (NORVASC ) 5 MG tablet Take 5 mg by mouth daily. 05/01/24  Yes [provider]  bevacizumab  (AVASTIN ) 2.5 mg/0.1 mL SOLN intravitreal injection 0.625 mg by  Intravitreal route every 30 (thirty) days.   Yes [provider]  ciprofloxacin (CILOXAN) 0.3 % ophthalmic solution Insert 1 drop in the right eye 4 times daily x 2 days, this is after his Avastin  injection 10/09/22  Yes [provider]  diltiazem  (CARDIZEM ) 30 MG tablet Take 1 tablet (30 mg total) by mouth every 8 (eight) hours as needed. 10/26/23  Yes Lavona Agent, MD  losartan -hydrochlorothiazide  (HYZAAR) 100-25 MG tablet Take 1 tablet by mouth daily. 07/05/24  Yes Levora Jeffrey SAUNDERS, MD  Polyethyl Glyc-Propyl Glyc PF (SYSTANE PRESERVATIVE FREE) 0.4-0.3 % SOLN Apply 1 drop to eye.   Yes [provider]  potassium chloride  (KLOR-CON  M) 10 MEQ tablet Take 1 tablet (10 mEq total) by mouth 2 (two) times daily. 05/21/24  Yes Levora Jeffrey  R, MD  rivaroxaban  (XARELTO ) 20 MG TABS tablet Take 1 tablet (20 mg total) by mouth daily with supper. 10/26/23  Yes Lavona Agent, MD  senna-docusate (SENOKOT-S) 8.6-50 MG tablet Take 1 tablet by mouth as needed for mild constipation. 02/03/23  Yes [provider]  Vitamin D, Ergocalciferol, 50000 units CAPS Take 50,000 Units by mouth every 30 (thirty) days.   Yes [provider]   Social History   Socioeconomic History   Marital status: Married    Spouse name: Not on file   Number of children: Not on file   Years of education: Not on file   Highest education level: Doctorate  Occupational History   Occupation: professor    Employer: [employer]  Tobacco Use   Smoking status: Never    Passive exposure: Never   Smokeless tobacco: Never  Vaping Use   Vaping status: Never Used  Substance and Sexual Activity   Alcohol use: Yes    Comment: Beer occ   Drug use: Never   Sexual activity: Yes    Partners: Female  Other Topics Concern   Not on file  Social History Narrative   Not on file   Social Drivers of Health   Financial Resource Strain: Low Risk  (10/13/2024)   Overall Financial Resource Strain (CARDIA)     Difficulty of Paying Living Expenses: Not hard at all  Food Insecurity: No Food Insecurity (10/17/2024)   Hunger Vital Sign    Worried About Running Out of Food in the Last Year: Never true    Ran Out of Food in the Last Year: Never true  Transportation Needs: No Transportation Needs (10/17/2024)   PRAPARE - Administrator, Civil Service (Medical): No    Lack of Transportation (Non-Medical): No  Physical Activity: Sufficiently Active (10/17/2024)   Exercise Vital Sign    Days of Exercise per Week: 4 days    Minutes of Exercise per Session: 40 min  Stress: No Stress Concern Present (10/17/2024)   [employer] of Occupational Health - Occupational Stress Questionnaire    Feeling of Stress: Not at all  Social Connections: Moderately Integrated (10/17/2024)   Social Connection and Isolation Panel    Frequency of Communication with Friends and Family: More than three times a week    Frequency of Social Gatherings with Friends and Family: Once a week    Attends Religious Services: 1 to 4 times per year    Active Member of [employer] or Organizations: No    Attends Banker Meetings: Never    Marital Status: Married  Catering Manager Violence: Not At Risk (10/17/2024)   Humiliation, Afraid, Rape, and Kick questionnaire    Fear of Current or Ex-Partner: No    Emotionally Abused: No    Physically Abused: No    Sexually Abused: No    Review of Systems Per HPI.   Objective:   Vitals:   10/31/24 1123  BP: 118/76  Pulse: 60  Resp: 12  Temp: 98.3 F (36.8 C)  TempSrc: Temporal  SpO2: 98%  Weight: 241 lb 3.2 oz (109.4 kg)  Height: 6' 1 (1.854 m)     Physical Exam Vitals reviewed.  Constitutional:      Appearance: He is well-developed.  HENT:     Head: Normocephalic and atraumatic.  Neck:     Vascular: No carotid bruit or JVD.  Cardiovascular:     Rate and Rhythm: Normal rate and regular rhythm.     Heart sounds:  Normal heart sounds. No murmur  heard. Pulmonary:     Effort: Pulmonary effort is normal.     Breath sounds: Normal breath sounds. No rales.  Musculoskeletal:     Right lower leg: No edema.     Left lower leg: No edema.  Skin:    General: Skin is warm and dry.  Neurological:     Mental Status: He is alert and oriented to person, place, and time.  Psychiatric:        Mood and Affect: Mood normal.       10/31/2024   12:30 PM  Montreal Cognitive Assessment   Visuospatial/ Executive (0/5) 5  Naming (0/3) 3  Attention: Read list of digits (0/2) 2  Attention: Read list of letters (0/1) 1  Attention: Serial 7 subtraction starting at 100 (0/3) 3  Language: Repeat phrase (0/2) 2  Language : Fluency (0/1) 1  Abstraction (0/2) 2  Delayed Recall (0/5) 0  Orientation (0/6) 6  Total 25  Adjusted Score (based on education) 25     Assessment & Plan:  Aristeo Hankerson is a 78 y.o. male . Paroxysmal atrial fibrillation (HCC) - Plan: Basic metabolic panel with GFR  - Doing well status post ablation.  Tolerating current med regimen, continue to monitor, and cardiology follow-up.  Hypokalemia - Plan: Basic metabolic panel with GFR  - Continue supplementation, check labs and adjust regimen accordingly  Essential hypertension - Plan: Basic metabolic panel with GFR  - Stable current regimen, no changes  Stage 3 chronic kidney disease, unspecified whether stage 3a or 3b CKD (HCC) - Plan: Basic metabolic panel with GFR  - No med changes recommended from nephrology, labs as above, continue to monitor.  Prostate cancer [facility]) - Plan: PSA  - Managed by urology as above, check PSA, adjust plan accordingly.  Concern about memory  - Overall reassuring in office testing.  1 domain affected on MoCA, previous 6 CIT is reassuring.  Option to meet with memory specialist but he would like to continue to monitor at this time.  I think that would be reasonable with RTC precautions.  No orders of the defined types were placed in this  encounter.  Patient Instructions  Glad to hear you are doing well since your ablation.  Blood pressure looks okay today.  I do not recommend changing any blood pressure medications for now especially if nephrology did not feel that was necessary either.  Your last screening test for memory looked okay and the screening test in the office today is also reassuring.  If you notice any changes in memory or other concerns, certainly follow-up with me and we can discuss that further but I do not think any other testing or medication is needed at this time.  Thank you for coming in today. No change in medications at this time. If there are any concerns on your bloodwork, I will let you know. Take care!     Signed,   Jeffrey Pines, MD [facility], [facility] 10/31/24 12:20 PM

## 2024-10-31 NOTE — Patient Instructions (Addendum)
 Glad to hear you are doing well since your ablation.  Blood pressure looks okay today.  I do not recommend changing any blood pressure medications for now especially if nephrology did not feel that was necessary either.  Your last screening test for memory looked okay and the screening test in the office today is also reassuring - the only missed portiosn were on recall that you did ok with cues.  If you notice any changes in memory or other concerns, certainly happy to refer you to neuro to discuss further.   Thank you for coming in today. No change in medications at this time. If there are any concerns on your bloodwork, I will let you know. Take care!

## 2024-11-01 LAB — BASIC METABOLIC PANEL WITH GFR
BUN: 16 mg/dL (ref 6–23)
CO2: 28 meq/L (ref 19–32)
Calcium: 9.1 mg/dL (ref 8.4–10.5)
Chloride: 102 meq/L (ref 96–112)
Creatinine, Ser: 1.2 mg/dL (ref 0.40–1.50)
GFR: 58.63 mL/min — ABNORMAL LOW (ref >60.00)
Glucose, Bld: 71 mg/dL (ref 70–99)
Potassium: 3.9 meq/L (ref 3.5–5.1)
Sodium: 138 meq/L (ref 135–145)

## 2024-11-01 LAB — PSA: PSA: 0.55 ng/mL (ref 0.10–4.00)

## 2024-11-04 ENCOUNTER — Ambulatory Visit: Payer: Self-pay | Admitting: Family Medicine

## 2024-11-06 ENCOUNTER — Encounter: Payer: Self-pay | Admitting: Family Medicine

## 2024-11-07 ENCOUNTER — Encounter (INDEPENDENT_AMBULATORY_CARE_PROVIDER_SITE_OTHER): Admitting: Ophthalmology

## 2024-11-07 DIAGNOSIS — I10 Essential (primary) hypertension: Secondary | ICD-10-CM

## 2024-11-07 DIAGNOSIS — H35033 Hypertensive retinopathy, bilateral: Secondary | ICD-10-CM | POA: Diagnosis not present

## 2024-11-07 DIAGNOSIS — H353211 Exudative age-related macular degeneration, right eye, with active choroidal neovascularization: Secondary | ICD-10-CM

## 2024-11-07 DIAGNOSIS — H353122 Nonexudative age-related macular degeneration, left eye, intermediate dry stage: Secondary | ICD-10-CM | POA: Diagnosis not present

## 2024-11-07 DIAGNOSIS — H43813 Vitreous degeneration, bilateral: Secondary | ICD-10-CM

## 2024-11-13 ENCOUNTER — Other Ambulatory Visit: Payer: Self-pay

## 2024-11-13 ENCOUNTER — Other Ambulatory Visit

## 2024-11-13 DIAGNOSIS — Z006 Encounter for examination for normal comparison and control in clinical research program: Secondary | ICD-10-CM

## 2024-11-13 DIAGNOSIS — E876 Hypokalemia: Secondary | ICD-10-CM

## 2024-11-13 MED ORDER — POTASSIUM CHLORIDE CRYS ER 10 MEQ PO TBCR: 10.0000 meq | EXTENDED_RELEASE_TABLET | Freq: Two times a day (BID) | ORAL | 1 refills | Status: AC

## 2024-11-13 NOTE — Telephone Encounter (Signed)
 Patient is requesting a refill on potassium. I last lab results level was normal. Do you want to refill?

## 2024-11-13 NOTE — Telephone Encounter (Signed)
 On meds last visit - stable, refilled.

## 2024-11-15 ENCOUNTER — Ambulatory Visit (HOSPITAL_COMMUNITY): Admission: RE | Admit: 2024-11-15 | Discharge: 2024-11-15 | Attending: Internal Medicine | Admitting: Internal Medicine

## 2024-11-15 VITALS — BP 140/76 | HR 59 | Ht 73.0 in | Wt 241.2 lb

## 2024-11-15 DIAGNOSIS — I480 Paroxysmal atrial fibrillation: Secondary | ICD-10-CM | POA: Diagnosis not present

## 2024-11-15 DIAGNOSIS — D6869 Other thrombophilia: Secondary | ICD-10-CM | POA: Diagnosis not present

## 2024-11-15 DIAGNOSIS — I4891 Unspecified atrial fibrillation: Secondary | ICD-10-CM | POA: Diagnosis not present

## 2024-11-15 NOTE — Progress Notes (Signed)
 Primary Care Physician: Levora Reyes SAUNDERS, MD Primary Cardiologist: Lynwood Schilling, MD Electrophysiologist: Fonda Kitty, MD     Referring Physician: Dr. Schilling Lade Broker is a 78 y.o. male with a history of HTN, mild dilatation of aorta, tricuspid valve insufficiency, CKD, and paroxysmal atrial fibrillation who presents for consultation in the [facility]. Cardiac monitor 11-10/2023 showed 3% PAF burden. Patient contacted office on 12/09/23 noting low HR at night and in AM making him feel weak and dizzy. Diltiazem  dose lowered to 120 mg daily by Dr. Schilling. Patient is on Xarelto  20 mg daily for a CHADS2VASC score of 3.  On evaluation today, he is currently in NSR. Patient tells me that he has been taking diltiazem  180 mg once daily and diltiazem  30 mg TID during the period noting the lower HR. He was not taking diltiazem  30 mg PRN but regularly TID. Review of watch data shows he has primarily HR 30-40 at bedtime but also in evening time while he is awake. He states sometimes it makes him dizzy and he has to lay his head back to feel improvement. He has not yet begun the diltiazem  120 mg daily sent by Dr. Schilling. He has noted increased palpitations over the past few weeks.   On follow up 07/03/24, patient is currently in NSR. Patient contacted office on 7/22 noting increase in Afib episodes.   On follow up 11/15/2024, patient is currently in NSR. S/p Afib ablation on 10/18/2024 by Dr. Kitty. No episodes of Afib since ablation confirmed with Apple watch. No chest pain or SOB. Leg sites healed without issue. No missed doses of anticoagulant.  Today, he denies symptoms of orthopnea, PND, lower extremity edema, dizziness, presyncope, syncope, snoring, daytime somnolence, bleeding, or neurologic sequela. The patient is tolerating medications without difficulties and is otherwise without complaint today.    he has a BMI of Body mass index is 31.82  kg/m.SABRA Filed Weights   11/15/24 0936  Weight: 109.4 kg     Current Outpatient Medications  Medication Sig Dispense Refill   alfuzosin  (UROXATRAL ) 10 MG 24 hr tablet TAKE 1 TABLET BY MOUTH EVERY MORNING WITH BREAKFAST 90 tablet 1   amLODipine  (NORVASC ) 5 MG tablet Take 5 mg by mouth daily.     bevacizumab  (AVASTIN ) 2.5 mg/0.1 mL SOLN intravitreal injection 0.625 mg by Intravitreal route every 30 (thirty) days.     ciprofloxacin (CILOXAN) 0.3 % ophthalmic solution Insert 1 drop in the right eye 4 times daily x 2 days, this is after his Avastin  injection     diltiazem  (CARDIZEM ) 30 MG tablet Take 1 tablet (30 mg total) by mouth every 8 (eight) hours as needed. 90 tablet 11   losartan -hydrochlorothiazide  (HYZAAR) 100-25 MG tablet Take 1 tablet by mouth daily. 90 tablet 1   Polyethyl Glyc-Propyl Glyc PF (SYSTANE PRESERVATIVE FREE) 0.4-0.3 % SOLN Apply 1 drop to eye. (Patient taking differently: Place 1 drop into both eyes in the morning and at bedtime.)     potassium chloride  (KLOR-CON  M) 10 MEQ tablet Take 1 tablet (10 mEq total) by mouth 2 (two) times daily. 180 tablet 1   rivaroxaban  (XARELTO ) 20 MG TABS tablet Take 1 tablet (20 mg total) by mouth daily with supper. 90 tablet 3   senna-docusate (SENOKOT-S) 8.6-50 MG tablet Take 1 tablet by mouth as needed for mild constipation.     Vitamin D, Ergocalciferol, 50000 units CAPS Take 50,000 Units by mouth every 30 (thirty) days.  No current facility-administered medications for this encounter.    Atrial Fibrillation Management history:  Previous antiarrhythmic drugs: none Previous cardioversions: none Previous ablations: 10/18/2024 Anticoagulation history: Xarelto  20 mg daily.   ROS- All systems are reviewed and negative except as per the HPI above.  Physical Exam: BP (!) 140/76   Pulse (!) 59   Ht 6' 1 (1.854 m)   Wt 109.4 kg   BMI 31.82 kg/m   GEN- The patient is well appearing, alert and oriented x 3 today.   Neck - no  JVD or carotid bruit noted Lungs- Clear to ausculation bilaterally, normal work of breathing Heart- Regular rate and rhythm, no murmurs, rubs or gallops, PMI not laterally displaced Extremities- no clubbing, cyanosis, or edema Skin - no rash or ecchymosis noted   EKG today demonstrates  EKG Interpretation Date/Time:  Thursday November 15 2024 09:58:25 EST Ventricular Rate:  59 PR Interval:  154 QRS Duration:  92 QT Interval:  406 QTC Calculation: 401 R Axis:   -60  Text Interpretation: Sinus bradycardia Left anterior fascicular block Minimal voltage criteria for LVH, may be normal variant ( R in aVL ) Abnormal ECG When compared with ECG of 18-Oct-2024 09:18, No significant change was found Confirmed by Terra Pac ([id number]) on 11/15/2024 10:17:59 AM    Cardiac monitor 11-10/2023: Predominant rhythm is normal sinus.   PAF occurred 3% of the time.  The average ventricular rate was well controlled.   Echo 04/21/23 demonstrated   1. Left ventricular ejection fraction, by estimation, is 65 to 70%. The  left ventricle has normal function. The left ventricle has no regional  wall motion abnormalities. There is mild concentric left ventricular  hypertrophy. Left ventricular diastolic  parameters are consistent with Grade I diastolic dysfunction (impaired  relaxation).   2. Right ventricular systolic function is normal. The right ventricular  size is normal. There is mildly elevated pulmonary artery systolic  pressure.   3. The mitral valve is normal in structure. Trivial mitral valve  regurgitation.   4. Tricuspid valve regurgitation is mild to moderate.   5. The aortic valve is tricuspid. There is mild calcification of the  aortic valve. There is mild thickening of the aortic valve. Aortic valve  regurgitation is not visualized. Aortic valve sclerosis/calcification is  present, without any evidence of  aortic stenosis.   6. Aortic dilatation noted. There is mild dilatation of the  ascending  aorta, measuring 43 mm.   7. The inferior vena cava is normal in size with greater than 50%  respiratory variability, suggesting right atrial pressure of 3 mmHg.   CHA2DS2-VASc Score = 3  The patient's score is based upon: CHF History: 0 HTN History: 1 Diabetes History: 0 Stroke History: 0 Vascular Disease History: 0 Age Score: 2 Gender Score: 0        ASSESSMENT AND PLAN: Paroxysmal Atrial Fibrillation (ICD10:  I48.0) The patient's CHA2DS2-VASc score is 3, indicating a 3.2% annual risk of stroke.   S/p A-fib ablation on 10/18/2024 by Dr. Kennyth.  Patient is currently in NSR.  We discussed what to expect during the recovery period and that breakthrough A-fib episodes may occur.  Reassurance provided.   Secondary Hypercoagulable State (ICD10:  D68.69) The patient is at significant risk for stroke/thromboembolism based upon his CHA2DS2-VASc Score of 3.  Continue Rivaroxaban  (Xarelto ).  No missed doses.  Continue Xarelto  without interruption in the blanking period.    Follow-up with EP as scheduled.   Terra Pac, PA-C  Afib  Clinic [facility] [address] [place], [place] [address] [phone]

## 2024-11-19 ENCOUNTER — Other Ambulatory Visit: Payer: Self-pay | Admitting: *Deleted

## 2024-11-19 DIAGNOSIS — M889 Osteitis deformans of unspecified bone: Secondary | ICD-10-CM

## 2024-11-19 DIAGNOSIS — N1831 Chronic kidney disease, stage 3a: Secondary | ICD-10-CM

## 2024-11-19 LAB — COMPREHENSIVE METABOLIC PANEL WITH GFR
AG Ratio: 1.3 (calc) (ref 1.0–2.5)
ALT: 19 U/L (ref 9–46)
AST: 18 U/L (ref 10–35)
Albumin: 4.1 g/dL (ref 3.6–5.1)
Alkaline phosphatase (APISO): 87 U/L (ref 35–144)
BUN/Creatinine Ratio: 13 (calc) (ref 6–22)
BUN: 17 mg/dL (ref 7–25)
CO2: 27 mmol/L (ref 20–32)
Calcium: 9.1 mg/dL (ref 8.6–10.3)
Chloride: 101 mmol/L (ref 98–110)
Creat: 1.29 mg/dL — ABNORMAL HIGH (ref 0.70–1.28)
Globulin: 3.1 g/dL (ref 1.9–3.7)
Glucose, Bld: 108 mg/dL — ABNORMAL HIGH (ref 65–99)
Potassium: 3.8 mmol/L (ref 3.5–5.3)
Sodium: 135 mmol/L (ref 135–146)
Total Bilirubin: 0.4 mg/dL (ref 0.2–1.2)
Total Protein: 7.2 g/dL (ref 6.1–8.1)
eGFR: 57 mL/min/1.73m2 — ABNORMAL LOW

## 2024-11-20 ENCOUNTER — Ambulatory Visit: Payer: Self-pay | Admitting: Rheumatology

## 2024-11-20 NOTE — Progress Notes (Signed)
 Glucose is mildly elevated, probably not a fasting sample.  Creatinine is elevated and stable.  Please forward results to his nephrologist and PCP.

## 2024-11-26 NOTE — Progress Notes (Signed)
 "  Office Visit Note  [name]             Date of Birth: 1946/06/03           MRN: [id number]             PCP: Levora Reyes SAUNDERS, MD Referring: Levora Reyes SAUNDERS, MD Visit Date: 12/06/2024 Occupation: Data Unavailable  Subjective:  Paget's disease  History of Present Illness: Jeffrey Perry is a 78 y.o. male with Paget's disease.  He returns today after his last visit in July 2025.  He states he has been doing well without any increased joint pain.  He states he had ablation for atrial fibrillation in November 2025 and has been doing better.  He has been trying to walk on a regular basis.  He continues to be on Xarelto .    Activities of Daily Living:  Patient reports morning stiffness for 0 minutes.   Patient Denies nocturnal pain.  Difficulty dressing/grooming: Denies Difficulty climbing stairs: Denies Difficulty getting out of chair: Denies Difficulty using hands for taps, buttons, cutlery, and/or writing: Denies  Review of Systems  Constitutional:  Negative for fatigue.  HENT:  Negative for mouth sores and mouth dryness.   Eyes:  Positive for dryness.  Respiratory:  Negative for shortness of breath.   Cardiovascular:  Negative for chest pain and palpitations.  Gastrointestinal:  Negative for blood in stool, constipation and diarrhea.  Endocrine: Negative for increased urination.  Genitourinary:  Negative for involuntary urination.  Musculoskeletal:  Negative for joint pain, gait problem, joint pain, joint swelling, myalgias, muscle weakness, morning stiffness, muscle tenderness and myalgias.  Skin:  Negative for color change, rash and sensitivity to sunlight.  Allergic/Immunologic: Negative for susceptible to infections.  Neurological:  Negative for dizziness and headaches.  Hematological:  Negative for swollen glands.  Psychiatric/Behavioral:  Negative for depressed mood and sleep disturbance. The patient is not nervous/anxious.     PMFS History:  Patient Active  Problem List   Diagnosis Date Noted   Microcytic anemia 07/23/2024   History of bradycardia 12/18/2023   Prediabetes 12/18/2023   Paroxysmal atrial fibrillation (HCC) 12/15/2023   Hypercoagulable state due to paroxysmal atrial fibrillation (HCC) 12/15/2023   Paget disease of bone: Pelvis and sacrum. 11/23/2023   Aortic root enlargement 03/24/2023   S/P laparoscopic cholecystectomy 02/02/2023   Tricuspid valve insufficiency 09/25/2020   Palpitations 09/25/2020   CKD (chronic kidney disease), stage II 09/25/2020   Murmur 07/18/2017   Elevated LFTs    Abdominal pain 04/30/2016   Renal insufficiency 03/05/2013   HTN (hypertension) 03/06/2012    Past Medical History:  Diagnosis Date   Atrial fibrillation (HCC)    Cataract    SMALL ONE IN ONE EYE LEFT   Chronic kidney disease    STAGE 2   Heart murmur    Hx of adenomatous polyp of colon 03/31/2023   Hypertension    Paget disease of bone    Questionable diagnosis.    Prostate cancer (HCC)    Sickle cell trait    Tricuspid regurgitation     Family History  Problem Relation Age of Onset   Hypertension Mother    Hypertension Sister    Stroke Sister    Hypertension Sister    Hypertension Brother    Hypertension Brother    Colon cancer Neg Hx    Colon polyps Neg Hx    Crohn's disease Neg Hx    Esophageal cancer Neg Hx    Rectal  cancer Neg Hx    Stomach cancer Neg Hx    Ulcerative colitis Neg Hx    Past Surgical History:  Procedure Laterality Date   ATRIAL FIBRILLATION ABLATION [name] 10/18/2024   Procedure: ATRIAL FIBRILLATION ABLATION;  Surgeon: Kennyth Chew, MD;  Location: [facility] INVASIVE CV LAB;  Service: Cardiovascular;  Laterality: [name];   BLADDER SURGERY     CHOLECYSTECTOMY  02/02/2023   COLONOSCOPY     FINGER GANGLION CYST EXCISION  2011   3rd finger right hand   HYDROCELE EXCISION  2012   PENILE PROSTHESIS IMPLANT     TRIGGER FINGER RELEASE Left 08/2021   thumb   Social History[1] Social History   Social  History Narrative   Not on file     Immunization History  Administered Date(s) Administered   DTaP 03/22/2008   Gamma Globulin 06/08/1994   H1N1 10/03/2004   Hepatitis A 03/29/1996, 06/10/1998   Hepatitis B 03/22/2008, 10/07/2008   IPV 03/18/2003   Meningococcal Conjugate 03/22/2008   PFIZER(Purple Top)SARS-COV-2 Vaccination 01/20/2020, 02/12/2020, 07/30/2020   Pneumococcal Conjugate-13 05/02/2010, 01/31/2019   Pneumococcal Polysaccharide-23 03/04/2014   Pneumococcal-Unspecified 05/02/2010   Tdap 03/22/2008, 03/24/2018   Typhoid Live 04/12/2017   Typhoid Parenteral 05/29/2002   Yellow Fever 03/22/2008   Zoster, Live 03/29/2010     Objective: Vital Signs: BP 126/81   Pulse 60   Temp 97.8 F (36.6 C)   Resp 16   Ht 6' 1 (1.854 m)   Wt 241 lb 6.4 oz (109.5 kg)   BMI 31.85 kg/m    Physical Exam Vitals and nursing note reviewed.  Constitutional:      Appearance: He is well-developed.  HENT:     Head: Normocephalic and atraumatic.  Eyes:     Conjunctiva/sclera: Conjunctivae normal.     Pupils: Pupils are equal, round, and reactive to light.  Cardiovascular:     Rate and Rhythm: Normal rate and regular rhythm.     Heart sounds: Normal heart sounds.  Pulmonary:     Effort: Pulmonary effort is normal.     Breath sounds: Normal breath sounds.  Abdominal:     General: Bowel sounds are normal.     Palpations: Abdomen is soft.  Musculoskeletal:     Cervical back: Normal range of motion and neck supple.  Skin:    General: Skin is warm and dry.     Capillary Refill: Capillary refill takes less than 2 seconds.  Neurological:     Mental Status: He is alert and oriented to person, place, and time.  Psychiatric:        Behavior: Behavior normal.      Musculoskeletal Exam: Cervical, thoracic and lumbar spine were in good range of motion.  There was no SI joint tenderness.  Shoulder joints, elbow joints, wrist joints, MCPs, PIPs and DIPs were in good range of motion with  no synovitis.  Hip joints and knee joints were in good range of motion.  He had some warmth and swelling on palpation of his right knee joint.  There was no tenderness over ankles or MTPs.   CDAI Exam: CDAI Score: -- Patient Global: --; Provider Global: -- Swollen: --; Tender: -- Joint Exam 12/06/2024   No joint exam has been documented for this visit   There is currently no information documented on the homunculus. Go to the Rheumatology activity and complete the homunculus joint exam.  Investigation: No additional findings.  Imaging: No results found.  Recent Labs: Lab Results  Component Value  Date   WBC 5.2 09/28/2024   HGB 12.4 (L) 09/28/2024   PLT 181 09/28/2024   NA 135 11/19/2024   K 3.8 11/19/2024   CL 101 11/19/2024   CO2 27 11/19/2024   GLUCOSE 108 (H) 11/19/2024   BUN 17 11/19/2024   CREATININE 1.29 (H) 11/19/2024   BILITOT 0.4 11/19/2024   ALKPHOS 70 04/29/2024   AST 18 11/19/2024   ALT 19 11/19/2024   PROT 7.2 11/19/2024   ALBUMIN 3.8 04/29/2024   CALCIUM 9.1 11/19/2024   GFRAA 60 06/04/2021   November 19, 2024 creatinine 1.29, GFR 57, alkaline phosphatase 87, AST 18, ALT 19  Speciality Comments: IV Reclast  5 mg on May 09, 2020  Procedures:  No procedures performed Allergies: Latex, Lisinopril, Neuromuscular blocking agents, Oxycodone, Phenazopyridine hcl, Phenazopyridine hcl, and Sulfonamide derivatives   Assessment / Plan:     Visit Diagnoses: Paget disease of bone - Pelvis and sacrum. IV Reclast  5 mg on May 09, 2020.November 19, 2024 creatinine 1.29, GFR 57, alkaline phosphatase 87, AST 18, ALT 19.  Labs were reviewed with the patient.  He had no tenderness in the sacral region or pelvic region.- Plan: Comprehensive metabolic panel with GFR in 6 months.  Swelling of right knee-he had some swelling and warmth in his right knee joint.  He states his right knee joint is not symptomatic.  I do not have any right knee joint x-rays in the system.   Patient would like to hold off any further evaluation as he is asymptomatic.  I advised him to contact us  if symptoms get worse.  Stage 3a chronic kidney disease (HCC) - Followed by Dr. Rayburn.  Creatinine stable.  Essential hypertension-patient was normal today at 126/81.  Other medical problems listed as follows:  Prostate cancer (HCC)  S/P cholecystectomy  Paroxysmal atrial fibrillation (HCC) - followed by cardiology. S/P ablation 10/18/24.  Chronic anticoagulation - on Xarelto .  Orders: Orders Placed This Encounter  Procedures   Comprehensive metabolic panel with GFR   No orders of the defined types were placed in this encounter.   Follow-Up Instructions: Return in about 1 year (around 12/06/2025) for  Paget's disease.   Maya Nash, MD  Note - This record has been created using Animal nutritionist.  Chart creation errors have been sought, but may not always  have been located. Such creation errors do not reflect on  the standard of medical care.     [1]  Social History Tobacco Use   Smoking status: Never    Passive exposure: Never   Smokeless tobacco: Never  Vaping Use   Vaping status: Never Used  Substance Use Topics   Alcohol use: Not Currently   Drug use: Never   "

## 2024-11-30 LAB — GENECONNECT MOLECULAR SCREEN: Genetic Analysis Overall Interpretation: NEGATIVE

## 2024-12-03 ENCOUNTER — Ambulatory Visit: Admitting: Family Medicine

## 2024-12-05 ENCOUNTER — Encounter (INDEPENDENT_AMBULATORY_CARE_PROVIDER_SITE_OTHER): Admitting: Ophthalmology

## 2024-12-05 DIAGNOSIS — H2513 Age-related nuclear cataract, bilateral: Secondary | ICD-10-CM

## 2024-12-05 DIAGNOSIS — H353211 Exudative age-related macular degeneration, right eye, with active choroidal neovascularization: Secondary | ICD-10-CM

## 2024-12-05 DIAGNOSIS — H353122 Nonexudative age-related macular degeneration, left eye, intermediate dry stage: Secondary | ICD-10-CM

## 2024-12-05 DIAGNOSIS — H43813 Vitreous degeneration, bilateral: Secondary | ICD-10-CM

## 2024-12-06 ENCOUNTER — Encounter: Payer: Self-pay | Admitting: Rheumatology

## 2024-12-06 ENCOUNTER — Ambulatory Visit: Attending: Rheumatology | Admitting: Rheumatology

## 2024-12-06 VITALS — BP 126/81 | HR 60 | Temp 97.8°F | Resp 16 | Ht 73.0 in | Wt 241.4 lb

## 2024-12-06 DIAGNOSIS — M889 Osteitis deformans of unspecified bone: Secondary | ICD-10-CM

## 2024-12-06 DIAGNOSIS — Z7901 Long term (current) use of anticoagulants: Secondary | ICD-10-CM

## 2024-12-06 DIAGNOSIS — I10 Essential (primary) hypertension: Secondary | ICD-10-CM | POA: Diagnosis not present

## 2024-12-06 DIAGNOSIS — C61 Malignant neoplasm of prostate: Secondary | ICD-10-CM

## 2024-12-06 DIAGNOSIS — Z9049 Acquired absence of other specified parts of digestive tract: Secondary | ICD-10-CM

## 2024-12-06 DIAGNOSIS — N1831 Chronic kidney disease, stage 3a: Secondary | ICD-10-CM | POA: Diagnosis not present

## 2024-12-06 DIAGNOSIS — I480 Paroxysmal atrial fibrillation: Secondary | ICD-10-CM

## 2024-12-06 DIAGNOSIS — M25461 Effusion, right knee: Secondary | ICD-10-CM | POA: Diagnosis not present

## 2024-12-06 NOTE — Patient Instructions (Signed)
 Standing Labs We placed an order today for your standing lab work.   Please have your standing labs drawn in June  Please have your labs drawn 2 weeks prior to your appointment so that the provider can discuss your lab results at your appointment, if possible.  Please note that you may see your imaging and lab results in MyChart before we have reviewed them. We will contact you once all results are reviewed. Please allow our office up to 72 hours to thoroughly review all of the results before contacting the office for clarification of your results.  WALK-IN LAB HOURS  Monday through Thursday from 8:00 am - 4:30 pm and Friday from 8:00 am-12:00 pm.  Patients with office visits requiring labs will be seen before walk-in labs.  You may encounter longer than normal wait times. Please allow additional time. Wait times may be shorter on  Monday and Thursday afternoons.  We do not book appointments for walk-in labs. We appreciate your patience and understanding with our staff.   Labs are drawn by [facility]. Please bring your co-pay at the time of your lab draw.  You may receive a bill from [facility] for your lab work.  Please note if you are on Hydroxychloroquine and and an order has been placed for a Hydroxychloroquine level,  you will need to have it drawn 4 hours or more after your last dose.  If you wish to have your labs drawn at another location, please call the office 24 hours in advance so we can fax the orders.  The office is located at [address], [place], [place] [address]   If you have any questions regarding directions or hours of operation,  please call [phone].   As a reminder, please drink plenty of water  prior to coming for your lab work. Thanks!

## 2024-12-10 ENCOUNTER — Encounter: Payer: Self-pay | Admitting: Cardiology

## 2025-01-01 ENCOUNTER — Other Ambulatory Visit: Payer: Self-pay

## 2025-01-01 DIAGNOSIS — I10 Essential (primary) hypertension: Secondary | ICD-10-CM

## 2025-01-01 MED ORDER — LOSARTAN POTASSIUM-HCTZ 100-25 MG PO TABS: 1.0000 | ORAL_TABLET | Freq: Every day | ORAL | 1 refills | Status: AC

## 2025-01-18 ENCOUNTER — Ambulatory Visit: Admitting: Pulmonary Disease

## 2025-02-15 ENCOUNTER — Ambulatory Visit: Admitting: Cardiology

## 2025-02-25 ENCOUNTER — Ambulatory Visit: Admitting: Pulmonary Disease

## 2025-02-27 ENCOUNTER — Encounter (INDEPENDENT_AMBULATORY_CARE_PROVIDER_SITE_OTHER): Admitting: Ophthalmology

## 2025-03-19 ENCOUNTER — Ambulatory Visit: Admitting: Cardiology

## 2025-12-05 ENCOUNTER — Ambulatory Visit: Admitting: Rheumatology
